# Patient Record
Sex: Female | Born: 1950 | Race: White | Hispanic: No | Marital: Married | Smoking: Former smoker
Health system: Southern US, Community
[De-identification: ages and names within clinical notes are randomized; demographics above are authoritative.]

## PROBLEM LIST (undated history)

## (undated) DIAGNOSIS — I509 Heart failure, unspecified: Secondary | ICD-10-CM

## (undated) DIAGNOSIS — F32A Depression, unspecified: Secondary | ICD-10-CM

## (undated) DIAGNOSIS — Z9989 Dependence on other enabling machines and devices: Secondary | ICD-10-CM

## (undated) DIAGNOSIS — J42 Unspecified chronic bronchitis: Secondary | ICD-10-CM

## (undated) DIAGNOSIS — M545 Low back pain, unspecified: Secondary | ICD-10-CM

## (undated) DIAGNOSIS — F329 Major depressive disorder, single episode, unspecified: Secondary | ICD-10-CM

## (undated) DIAGNOSIS — I10 Essential (primary) hypertension: Secondary | ICD-10-CM

## (undated) DIAGNOSIS — M797 Fibromyalgia: Secondary | ICD-10-CM

## (undated) DIAGNOSIS — I447 Left bundle-branch block, unspecified: Secondary | ICD-10-CM

## (undated) DIAGNOSIS — R011 Cardiac murmur, unspecified: Secondary | ICD-10-CM

## (undated) DIAGNOSIS — R519 Headache, unspecified: Secondary | ICD-10-CM

## (undated) DIAGNOSIS — R51 Headache: Secondary | ICD-10-CM

## (undated) DIAGNOSIS — M549 Dorsalgia, unspecified: Secondary | ICD-10-CM

## (undated) DIAGNOSIS — I255 Ischemic cardiomyopathy: Secondary | ICD-10-CM

## (undated) DIAGNOSIS — G8929 Other chronic pain: Secondary | ICD-10-CM

## (undated) DIAGNOSIS — R55 Syncope and collapse: Secondary | ICD-10-CM

## (undated) DIAGNOSIS — J189 Pneumonia, unspecified organism: Secondary | ICD-10-CM

## (undated) DIAGNOSIS — J45909 Unspecified asthma, uncomplicated: Secondary | ICD-10-CM

## (undated) DIAGNOSIS — M1990 Unspecified osteoarthritis, unspecified site: Secondary | ICD-10-CM

## (undated) DIAGNOSIS — J449 Chronic obstructive pulmonary disease, unspecified: Secondary | ICD-10-CM

## (undated) DIAGNOSIS — I219 Acute myocardial infarction, unspecified: Secondary | ICD-10-CM

## (undated) DIAGNOSIS — E785 Hyperlipidemia, unspecified: Secondary | ICD-10-CM

## (undated) DIAGNOSIS — F419 Anxiety disorder, unspecified: Secondary | ICD-10-CM

## (undated) DIAGNOSIS — I2510 Atherosclerotic heart disease of native coronary artery without angina pectoris: Secondary | ICD-10-CM

## (undated) DIAGNOSIS — K219 Gastro-esophageal reflux disease without esophagitis: Secondary | ICD-10-CM

## (undated) DIAGNOSIS — Z95810 Presence of automatic (implantable) cardiac defibrillator: Secondary | ICD-10-CM

## (undated) DIAGNOSIS — G4733 Obstructive sleep apnea (adult) (pediatric): Secondary | ICD-10-CM

## (undated) HISTORY — PX: HYSTEROSCOPY W/ ENDOMETRIAL ABLATION: SUR665

## (undated) HISTORY — DX: Atherosclerotic heart disease of native coronary artery without angina pectoris: I25.10

## (undated) HISTORY — PX: CORONARY ANGIOPLASTY WITH STENT PLACEMENT: SHX49

## (undated) HISTORY — DX: Syncope and collapse: R55

## (undated) HISTORY — DX: Chronic obstructive pulmonary disease, unspecified: J44.9

## (undated) HISTORY — DX: Left bundle-branch block, unspecified: I44.7

## (undated) HISTORY — DX: Essential (primary) hypertension: I10

## (undated) HISTORY — PX: FRACTURE SURGERY: SHX138

---

## 1987-02-18 HISTORY — PX: TUBAL LIGATION: SHX77

## 2009-10-21 HISTORY — PX: ORIF ANKLE FRACTURE: SHX5408

## 2012-02-18 HISTORY — PX: CORONARY ANGIOPLASTY WITH STENT PLACEMENT: SHX49

## 2012-12-13 ENCOUNTER — Other Ambulatory Visit: Payer: Self-pay | Admitting: Cardiology

## 2012-12-13 ENCOUNTER — Encounter: Payer: Self-pay | Admitting: *Deleted

## 2012-12-13 ENCOUNTER — Ambulatory Visit (INDEPENDENT_AMBULATORY_CARE_PROVIDER_SITE_OTHER): Payer: BC Managed Care – PPO | Admitting: Cardiology

## 2012-12-13 ENCOUNTER — Encounter: Payer: Self-pay | Admitting: Cardiology

## 2012-12-13 VITALS — BP 132/79 | HR 61 | Ht 60.0 in | Wt 181.0 lb

## 2012-12-13 DIAGNOSIS — R079 Chest pain, unspecified: Secondary | ICD-10-CM

## 2012-12-13 DIAGNOSIS — I25810 Atherosclerosis of coronary artery bypass graft(s) without angina pectoris: Secondary | ICD-10-CM

## 2012-12-13 DIAGNOSIS — I2510 Atherosclerotic heart disease of native coronary artery without angina pectoris: Secondary | ICD-10-CM

## 2012-12-13 DIAGNOSIS — E785 Hyperlipidemia, unspecified: Secondary | ICD-10-CM

## 2012-12-13 DIAGNOSIS — R002 Palpitations: Secondary | ICD-10-CM

## 2012-12-13 DIAGNOSIS — I10 Essential (primary) hypertension: Secondary | ICD-10-CM | POA: Insufficient documentation

## 2012-12-13 MED ORDER — NITROGLYCERIN 0.4 MG SL SUBL: 0.4000 mg | SUBLINGUAL_TABLET | SUBLINGUAL | Status: DC | PRN

## 2012-12-13 NOTE — Patient Instructions (Signed)
Your physician recommends that you schedule a follow-up appointment in: 3-4 weeks with Dr. Wyline Mood. This appointment will be scheduled today before you leave.  Your physician recommends that you continue on your current medications as directed. Please refer to the Current Medication list given to you today.  Your physician has recommended that you wear an 7 day event monitor. Event monitors are medical devices that record the heart's electrical activity. Doctors most often [place] these monitors to diagnose arrhythmias. Arrhythmias are problems with the speed or rhythm of the heartbeat. The monitor is a small, portable device. You can wear one while you do your normal daily activities. This is usually used to diagnose what is causing palpitations/syncope (passing out).

## 2012-12-13 NOTE — Progress Notes (Addendum)
Clinical Summary Ms. Dayley is a 62 y.o.female seen today as a new patient, she has been previously been followed in [place], [place] for the following problems.   1.CAD - multiple stents placed as described below, last seems was placed in Jan 2014 per her report - Echo 03/2010 with LVEF 40%, anteroapical hypokinesis. History of chronic left bundle Mychal Decarlo block. LV gram 04/2010 LVEF 55%  - reports daily chest pain. Described as sharp pain in mid chest, 4/10. +palps. No SOB, palpitations. Pain lasts for 30 seconds, better with nitroglycerin. Reports pain has been going on for last 2 years, reports last few stents haven't made much difference in symptoms. Occurs at rest or with exertion. Notes some mild increase in frequency in pain over the last few months, occurs 3-4 times a week.   - compliant with medications: on plavix since 2007, takes ASA in form of bc powder daily, lisinopril, lovastatin, metoprol.   2. HTN - dizziness with standing up, improved with recent decrease of lisinopril/HCTZ to 5/6.25 mg - compliant with medications  3. Hyperlipidemia - reports prior side effects on other statins, has tolerated lovastatin without troubles. She is not sure which other statins she has tried before.   4. Palpitations - feels several times a day. Lasts 2-3 minutes, with some associated chest pain. + SOB + lightheadedness - symptoms for several years, initially improved on metoprolol but now noticing getting worst. - drinks multiple cups of coffee, occasional sodas  Past Medical History  Diagnosis Date  . Reflux   . Hypertension   . Other and unspecified hyperlipidemia   . CAD (coronary artery disease)   . LBBB (left bundle Ahmia Colford block)   . History of depression   . COPD (chronic obstructive pulmonary disease)      Allergies  Allergen Reactions  . Codeine   . Fosamax [Alendronate Sodium] Nausea Only and Other (See Comments)    Stomach pain  . Keflex [Cephalexin] Nausea Only   . Lipitor [Atorvastatin] Other (See Comments)    Leg pain  . Prozac [Fluoxetine Hcl] Other (See Comments)    Mean/violent thoughts     Current Outpatient Prescriptions  Medication Sig Dispense Refill  . albuterol (PROAIR HFA) 108 (90 BASE) MCG/ACT inhaler Inhale 2 puffs into the lungs every 6 (six) hours as needed for wheezing.      Marland Kitchen azelastine (OPTIVAR) 0.05 % ophthalmic solution Apply 1 drop to eye 2 (two) times daily.      . clopidogrel (PLAVIX) 75 MG tablet Take 1 tablet by mouth daily.      Marland Kitchen DEXILANT 60 MG capsule Take 1 capsule by mouth daily.      Marland Kitchen escitalopram (LEXAPRO) 20 MG tablet Take 1 tablet by mouth daily.      Marland Kitchen HYDROcodone-acetaminophen (NORCO/VICODIN) 5-325 MG per tablet Take 1 tablet by mouth every 4 (four) hours as needed.      Marland Kitchen ibuprofen (ADVIL,MOTRIN) 800 MG tablet Take 1 tablet by mouth 3 (three) times daily as needed.      . lactulose (CHRONULAC) 10 GM/15ML solution Take 15 mLs by mouth 3 (three) times daily as needed.      . lidocaine (LIDODERM) 5 % Place 1 patch onto the skin daily. Remove after 12 hours      . lisinopril-hydrochlorothiazide (PRINZIDE,ZESTORETIC) 10-12.5 MG per tablet Take 1 tablet by mouth daily.      Marland Kitchen lovastatin (MEVACOR) 40 MG tablet Take 1 tablet by mouth daily.      Marland Kitchen  metoprolol (LOPRESSOR) 50 MG tablet Take 1 tablet by mouth 2 (two) times daily.      . montelukast (SINGULAIR) 10 MG tablet Take 1 tablet by mouth daily.      Marland Kitchen NITROSTAT 0.4 MG SL tablet Place 1 tablet under the tongue every 5 (five) minutes x 3 doses as needed. If no relief after 3rd dose, proceed to ED      . nystatin cream (MYCOSTATIN) Apply 1 application topically 2 (two) times daily as needed for dry skin.      Marland Kitchen rOPINIRole (REQUIP) 0.5 MG tablet Take 1-2 tablets by mouth at bedtime.      Marland Kitchen SAVELLA 100 MG TABS tablet Take 1 tablet by mouth 2 (two) times daily.      Marland Kitchen SPIRIVA HANDIHALER 18 MCG inhalation capsule Place 1 capsule into inhaler and inhale daily.      .  traMADol (ULTRAM) 50 MG tablet Take 2 tablets by mouth 2 (two) times daily as needed.       No current facility-administered medications for this visit.     Past Surgical History  Procedure Laterality Date  . Angioplasty      stent 2011, 2010, 2009  . Orif ankle fracture  [id number]  . Tubal ligation  1989  . Coronary stent placement  02/2012     Allergies  Allergen Reactions  . Codeine   . Fosamax [Alendronate Sodium] Nausea Only and Other (See Comments)    Stomach pain  . Keflex [Cephalexin] Nausea Only  . Lipitor [Atorvastatin] Other (See Comments)    Leg pain  . Prozac [Fluoxetine Hcl] Other (See Comments)    Mean/violent thoughts      Family History  Problem Relation Age of Onset  . Breast cancer Mother   . Heart failure Brother   . Breast cancer Sister   . Breast cancer Sister   . Uterine cancer Sister      Social History Ms. Shropshire reports that she has been smoking Cigarettes.  She has been smoking about 0.50 packs per day. She does not have any smokeless tobacco history on file. Ms. Rollins has no alcohol history on file.   Review of Systems CONSTITUTIONAL: No weight loss, fever, chills, weakness or fatigue.  HEENT: Eyes: No visual loss, blurred vision, double vision or yellow sclerae.No hearing loss, sneezing, congestion, runny nose or sore throat.  SKIN: No rash or itching.  CARDIOVASCULAR: per HPI RESPIRATORY: per HPI  GASTROINTESTINAL: No anorexia, nausea, vomiting or diarrhea. No abdominal pain or blood.  GENITOURINARY: No burning on urination, no polyuria NEUROLOGICAL: No headache, dizziness, syncope, paralysis, ataxia, numbness or tingling in the extremities. No change in bowel or bladder control.  MUSCULOSKELETAL: No muscle, back pain, joint pain or stiffness.  LYMPHATICS: No enlarged nodes. No history of splenectomy.  PSYCHIATRIC: No history of depression or anxiety.  ENDOCRINOLOGIC: No reports of sweating, cold or heat intolerance. No polyuria  or polydipsia.  Marland Kitchen   Physical Examination p 61 bp 132/79 Wt 181 lbs BMI 35 Gen: resting comfortably, no acute distress HEENT: no scleral icterus, pupils equal round and reactive, no palptable cervical adenopathy,  CV: RRR, no m/r/g,no JVD, no carotid bruits Resp: Clear to auscultation bilaterally GI: abdomen is soft, non-tender, non-distended, normal bowel sounds, no hepatosplenomegaly MSK: extremities are warm, no edema.  Skin: warm, no rash Neuro:  no focal deficits Psych: appropriate affect   Diagnostic Studies 04/2010 Cath [place], [place]: LVEF 55%, mild anterior hypokinesis. LM normal, LAD with stents in prox  and mid portion widely patent, LAD ostial 30%. Diags with luminal irregs. LCX with luminial irregs. RCA with distal patent stents  Stent cards May 2008 DES to PDA, DES to LAD Jan 2009 DES to prox LAD Mar 05 2009 DES to LAD x2 05/2011 DES to RCA Jan 2014 stent done [place], [place]   03/2010 Echo: LVEF 40%, hypokinesis of the anteroseptum.   12/13/12 Clinic EKG: sinus, LAD, LBBB (old),  Assessment and Plan  1. CAD - multiple stenting procedures in the past, most recent per her report was Jan 2014. Has had consistent chest pain over the last 2 years that has not improved with recent stents. - obtain Jan 2014 cath records - continue current medical therapy, her antianginal therapy is somewhat limited by her orthostatic symptoms - pending cath records, decide of possible further ischemic workup. It seems interventions have not improved her pain. - consider ranexa as antianginal at next visit  2. HTN - previously has significant orthostatic symptoms that have improved since decreasing her lisinopril/HCTZ - continue current meds, can consider changing HCTZ to alternative with additional anti-anginal effects (ex. Norvasc) at follow up visits  3. Hyperlipidemia - on fairly mild statin, reports previous muscle aches to prior statin drugs, she is unsure which one - continue  current statin, will need to check a lipid panel in the near future  4. Palpitations - will obtain a 7 day event monitor - counseled on decreasing her caffeine intake, which is quite substantial - continue metoprolol at current dose   F/u 3-4 weeks   Antoine Poche, M.D., F.A.C.C.

## 2012-12-15 DIAGNOSIS — R002 Palpitations: Secondary | ICD-10-CM

## 2012-12-18 DIAGNOSIS — R55 Syncope and collapse: Secondary | ICD-10-CM

## 2012-12-18 DIAGNOSIS — I219 Acute myocardial infarction, unspecified: Secondary | ICD-10-CM

## 2012-12-18 HISTORY — DX: Syncope and collapse: R55

## 2012-12-18 HISTORY — PX: CARDIAC CATHETERIZATION: SHX172

## 2012-12-18 HISTORY — DX: Acute myocardial infarction, unspecified: I21.9

## 2013-01-02 ENCOUNTER — Inpatient Hospital Stay (HOSPITAL_COMMUNITY): Payer: BC Managed Care – PPO

## 2013-01-02 ENCOUNTER — Encounter (HOSPITAL_COMMUNITY): Payer: Self-pay | Admitting: Physician Assistant

## 2013-01-02 ENCOUNTER — Inpatient Hospital Stay (HOSPITAL_COMMUNITY)
Admission: AD | Admit: 2013-01-02 | Discharge: 2013-01-05 | DRG: 281 | Disposition: A | Discharge status: Home / Self Care | Payer: BC Managed Care – PPO | Source: Other Acute Inpatient Hospital | Attending: Internal Medicine | Admitting: Internal Medicine

## 2013-01-02 DIAGNOSIS — E785 Hyperlipidemia, unspecified: Secondary | ICD-10-CM | POA: Diagnosis present

## 2013-01-02 DIAGNOSIS — F17200 Nicotine dependence, unspecified, uncomplicated: Secondary | ICD-10-CM | POA: Diagnosis present

## 2013-01-02 DIAGNOSIS — I509 Heart failure, unspecified: Secondary | ICD-10-CM | POA: Diagnosis present

## 2013-01-02 DIAGNOSIS — I5022 Chronic systolic (congestive) heart failure: Secondary | ICD-10-CM | POA: Diagnosis present

## 2013-01-02 DIAGNOSIS — F3289 Other specified depressive episodes: Secondary | ICD-10-CM | POA: Diagnosis present

## 2013-01-02 DIAGNOSIS — I10 Essential (primary) hypertension: Secondary | ICD-10-CM | POA: Diagnosis present

## 2013-01-02 DIAGNOSIS — Z7982 Long term (current) use of aspirin: Secondary | ICD-10-CM

## 2013-01-02 DIAGNOSIS — R55 Syncope and collapse: Secondary | ICD-10-CM | POA: Diagnosis present

## 2013-01-02 DIAGNOSIS — I447 Left bundle-branch block, unspecified: Secondary | ICD-10-CM | POA: Diagnosis present

## 2013-01-02 DIAGNOSIS — IMO0001 Reserved for inherently not codable concepts without codable children: Secondary | ICD-10-CM | POA: Diagnosis present

## 2013-01-02 DIAGNOSIS — R002 Palpitations: Secondary | ICD-10-CM

## 2013-01-02 DIAGNOSIS — I214 Non-ST elevation (NSTEMI) myocardial infarction: Principal | ICD-10-CM

## 2013-01-02 DIAGNOSIS — Z79899 Other long term (current) drug therapy: Secondary | ICD-10-CM

## 2013-01-02 DIAGNOSIS — M545 Low back pain, unspecified: Secondary | ICD-10-CM | POA: Diagnosis present

## 2013-01-02 DIAGNOSIS — Z9861 Coronary angioplasty status: Secondary | ICD-10-CM

## 2013-01-02 DIAGNOSIS — M129 Arthropathy, unspecified: Secondary | ICD-10-CM | POA: Diagnosis present

## 2013-01-02 DIAGNOSIS — J4489 Other specified chronic obstructive pulmonary disease: Secondary | ICD-10-CM | POA: Diagnosis present

## 2013-01-02 DIAGNOSIS — I951 Orthostatic hypotension: Secondary | ICD-10-CM | POA: Diagnosis present

## 2013-01-02 DIAGNOSIS — F329 Major depressive disorder, single episode, unspecified: Secondary | ICD-10-CM | POA: Diagnosis present

## 2013-01-02 DIAGNOSIS — Z7902 Long term (current) use of antithrombotics/antiplatelets: Secondary | ICD-10-CM

## 2013-01-02 DIAGNOSIS — Z9181 History of falling: Secondary | ICD-10-CM

## 2013-01-02 DIAGNOSIS — I2589 Other forms of chronic ischemic heart disease: Secondary | ICD-10-CM | POA: Diagnosis present

## 2013-01-02 DIAGNOSIS — I2510 Atherosclerotic heart disease of native coronary artery without angina pectoris: Secondary | ICD-10-CM | POA: Diagnosis present

## 2013-01-02 DIAGNOSIS — J449 Chronic obstructive pulmonary disease, unspecified: Secondary | ICD-10-CM | POA: Diagnosis present

## 2013-01-02 HISTORY — DX: Hyperlipidemia, unspecified: E78.5

## 2013-01-02 HISTORY — DX: Ischemic cardiomyopathy: I25.5

## 2013-01-02 HISTORY — DX: Fibromyalgia: M79.7

## 2013-01-02 HISTORY — DX: Cardiac murmur, unspecified: R01.1

## 2013-01-02 HISTORY — DX: Gastro-esophageal reflux disease without esophagitis: K21.9

## 2013-01-02 LAB — TROPONIN I
Troponin I: 7.17 ng/mL (ref <0.30)
Troponin I: 3.39 ng/mL (ref <0.30)

## 2013-01-02 MED ORDER — PANTOPRAZOLE SODIUM 40 MG PO TBEC
40.0000 mg | DELAYED_RELEASE_TABLET | Freq: Every day | ORAL | Status: DC
  Administered 2013-01-02 – 2013-01-05 (×4): 40 mg via ORAL
  Filled 2013-01-02 (×4): qty 1

## 2013-01-02 MED ORDER — HEPARIN BOLUS VIA INFUSION
3800.0000 [IU] | Freq: Once | INTRAVENOUS | Status: AC
  Administered 2013-01-02: 3800 [IU] via INTRAVENOUS
  Filled 2013-01-02: qty 3800

## 2013-01-02 MED ORDER — LOVASTATIN 20 MG PO TABS
40.0000 mg | ORAL_TABLET | Freq: Every day | ORAL | Status: DC
  Administered 2013-01-02 – 2013-01-04 (×3): 40 mg via ORAL
  Filled 2013-01-02 (×5): qty 2

## 2013-01-02 MED ORDER — LIDOCAINE 5 % EX PTCH
1.0000 | MEDICATED_PATCH | CUTANEOUS | Status: DC
  Administered 2013-01-02 – 2013-01-04 (×3): 1 via TRANSDERMAL
  Filled 2013-01-02 (×5): qty 1

## 2013-01-02 MED ORDER — SODIUM CHLORIDE 0.9 % IJ SOLN: 3.0000 mL | INTRAMUSCULAR | Status: DC | PRN

## 2013-01-02 MED ORDER — MILNACIPRAN HCL 100 MG PO TABS
100.0000 mg | ORAL_TABLET | Freq: Two times a day (BID) | ORAL | Status: DC
  Administered 2013-01-02 – 2013-01-05 (×6): 100 mg via ORAL
  Filled 2013-01-02 (×7): qty 1

## 2013-01-02 MED ORDER — NYSTATIN-TRIAMCINOLONE 100000-0.1 UNIT/GM-% EX CREA
1.0000 "application " | TOPICAL_CREAM | Freq: Two times a day (BID) | CUTANEOUS | Status: DC
  Administered 2013-01-02 – 2013-01-05 (×3): 1 via TOPICAL
  Filled 2013-01-02: qty 15

## 2013-01-02 MED ORDER — SODIUM CHLORIDE 0.9 % IV SOLN
INTRAVENOUS | Status: DC
  Administered 2013-01-03: 05:00:00 via INTRAVENOUS

## 2013-01-02 MED ORDER — ASPIRIN 81 MG PO CHEW
81.0000 mg | CHEWABLE_TABLET | ORAL | Status: AC
  Administered 2013-01-03: 81 mg via ORAL
  Filled 2013-01-02: qty 1

## 2013-01-02 MED ORDER — CALCIUM 1200 1200-1000 MG-UNIT PO CHEW: 1.0000 | CHEWABLE_TABLET | Freq: Every day | ORAL | Status: DC

## 2013-01-02 MED ORDER — SODIUM CHLORIDE 0.9 % IV SOLN: 250.0000 mL | INTRAVENOUS | Status: DC | PRN

## 2013-01-02 MED ORDER — ONDANSETRON HCL 4 MG/2ML IJ SOLN: 4.0000 mg | Freq: Four times a day (QID) | INTRAMUSCULAR | Status: DC | PRN

## 2013-01-02 MED ORDER — WHITE PETROLATUM GEL
Status: AC
  Administered 2013-01-02: 17:00:00
  Filled 2013-01-02: qty 5

## 2013-01-02 MED ORDER — SODIUM CHLORIDE 0.9 % IV SOLN
INTRAVENOUS | Status: DC
  Administered 2013-01-02: 1000 mL via INTRAVENOUS

## 2013-01-02 MED ORDER — LISINOPRIL-HYDROCHLOROTHIAZIDE 10-12.5 MG PO TABS: 0.5000 | ORAL_TABLET | Freq: Every day | ORAL | Status: DC

## 2013-01-02 MED ORDER — ASPIRIN 81 MG PO CHEW
81.0000 mg | CHEWABLE_TABLET | Freq: Every day | ORAL | Status: DC
  Administered 2013-01-04 – 2013-01-05 (×2): 81 mg via ORAL
  Filled 2013-01-02 (×2): qty 1

## 2013-01-02 MED ORDER — LACTULOSE 10 GM/15ML PO SOLN
30.0000 g | Freq: Two times a day (BID) | ORAL | Status: DC
  Administered 2013-01-04 – 2013-01-05 (×2): 30 g via ORAL
  Filled 2013-01-02 (×9): qty 45

## 2013-01-02 MED ORDER — CLOPIDOGREL BISULFATE 75 MG PO TABS
75.0000 mg | ORAL_TABLET | Freq: Every day | ORAL | Status: DC
  Administered 2013-01-02 – 2013-01-05 (×4): 75 mg via ORAL
  Filled 2013-01-02 (×4): qty 1

## 2013-01-02 MED ORDER — VITAMIN D 50 MCG (2000 UT) PO CAPS: 2000.0000 [IU] | ORAL_CAPSULE | Freq: Every day | ORAL | Status: DC

## 2013-01-02 MED ORDER — LISINOPRIL 10 MG PO TABS
10.0000 mg | ORAL_TABLET | Freq: Every day | ORAL | Status: DC
  Administered 2013-01-02 – 2013-01-03 (×2): 10 mg via ORAL
  Filled 2013-01-02 (×2): qty 1

## 2013-01-02 MED ORDER — CLOPIDOGREL BISULFATE 75 MG PO TABS: 75.0000 mg | ORAL_TABLET | Freq: Every day | ORAL | Status: DC

## 2013-01-02 MED ORDER — CALCIUM CARBONATE-VITAMIN D 500-200 MG-UNIT PO TABS
1.0000 | ORAL_TABLET | Freq: Every day | ORAL | Status: DC
  Administered 2013-01-03 – 2013-01-05 (×3): 1 via ORAL
  Filled 2013-01-02 (×5): qty 1

## 2013-01-02 MED ORDER — TRAMADOL HCL 50 MG PO TABS
100.0000 mg | ORAL_TABLET | Freq: Two times a day (BID) | ORAL | Status: DC
  Administered 2013-01-02 – 2013-01-05 (×5): 100 mg via ORAL
  Filled 2013-01-02 (×6): qty 2

## 2013-01-02 MED ORDER — NITROGLYCERIN 0.4 MG SL SUBL: 0.4000 mg | SUBLINGUAL_TABLET | SUBLINGUAL | Status: DC | PRN

## 2013-01-02 MED ORDER — SODIUM CHLORIDE 0.9 % IJ SOLN: 3.0000 mL | Freq: Two times a day (BID) | INTRAMUSCULAR | Status: DC

## 2013-01-02 MED ORDER — ROPINIROLE HCL 1 MG PO TABS
1.0000 mg | ORAL_TABLET | Freq: Every day | ORAL | Status: DC
  Administered 2013-01-02 – 2013-01-04 (×3): 1 mg via ORAL
  Filled 2013-01-02 (×4): qty 1

## 2013-01-02 MED ORDER — HYDROCODONE-ACETAMINOPHEN 5-325 MG PO TABS
2.0000 | ORAL_TABLET | Freq: Four times a day (QID) | ORAL | Status: DC | PRN
  Administered 2013-01-02 – 2013-01-05 (×6): 2 via ORAL
  Filled 2013-01-02 (×6): qty 2

## 2013-01-02 MED ORDER — METOPROLOL TARTRATE 50 MG PO TABS
50.0000 mg | ORAL_TABLET | Freq: Two times a day (BID) | ORAL | Status: DC
Start: 2013-01-02 — End: 2013-01-05
  Administered 2013-01-02 – 2013-01-05 (×6): 50 mg via ORAL
  Filled 2013-01-02 (×7): qty 1

## 2013-01-02 MED ORDER — ALBUTEROL SULFATE HFA 108 (90 BASE) MCG/ACT IN AERS
2.0000 | INHALATION_SPRAY | RESPIRATORY_TRACT | Status: DC | PRN
  Administered 2013-01-03 – 2013-01-04 (×2): 2 via RESPIRATORY_TRACT
  Filled 2013-01-02 (×2): qty 6.7

## 2013-01-02 MED ORDER — ACETAMINOPHEN 325 MG PO TABS
650.0000 mg | ORAL_TABLET | ORAL | Status: DC | PRN
  Administered 2013-01-04: 650 mg via ORAL
  Filled 2013-01-02: qty 2

## 2013-01-02 MED ORDER — SODIUM CHLORIDE 0.9 % IJ SOLN
3.0000 mL | Freq: Two times a day (BID) | INTRAMUSCULAR | Status: DC
  Administered 2013-01-03 (×2): 3 mL via INTRAVENOUS

## 2013-01-02 MED ORDER — MONTELUKAST SODIUM 10 MG PO TABS
10.0000 mg | ORAL_TABLET | Freq: Every morning | ORAL | Status: DC
  Administered 2013-01-03 – 2013-01-05 (×3): 10 mg via ORAL
  Filled 2013-01-02 (×4): qty 1

## 2013-01-02 MED ORDER — HEPARIN (PORCINE) IN NACL 100-0.45 UNIT/ML-% IJ SOLN
800.0000 [IU]/h | INTRAMUSCULAR | Status: DC
  Administered 2013-01-02: 800 [IU]/h via INTRAVENOUS
  Filled 2013-01-02: qty 250

## 2013-01-02 MED ORDER — LORATADINE 10 MG PO TABS
10.0000 mg | ORAL_TABLET | Freq: Every day | ORAL | Status: DC
Start: 2013-01-02 — End: 2013-01-05
  Administered 2013-01-02 – 2013-01-05 (×4): 10 mg via ORAL
  Filled 2013-01-02 (×4): qty 1

## 2013-01-02 MED ORDER — ESCITALOPRAM OXALATE 20 MG PO TABS
20.0000 mg | ORAL_TABLET | Freq: Every day | ORAL | Status: DC
  Administered 2013-01-02 – 2013-01-05 (×4): 20 mg via ORAL
  Filled 2013-01-02 (×4): qty 1

## 2013-01-02 MED ORDER — LOVASTATIN 20 MG PO TABS: 40.0000 mg | ORAL_TABLET | Freq: Every day | ORAL | Status: DC

## 2013-01-02 MED ORDER — HYDROCHLOROTHIAZIDE 12.5 MG PO CAPS
12.5000 mg | ORAL_CAPSULE | Freq: Every day | ORAL | Status: DC
  Administered 2013-01-02: 12.5 mg via ORAL
  Filled 2013-01-02 (×2): qty 1

## 2013-01-02 MED ORDER — TIOTROPIUM BROMIDE MONOHYDRATE 18 MCG IN CAPS
1.0000 | ORAL_CAPSULE | Freq: Every day | RESPIRATORY_TRACT | Status: DC
  Administered 2013-01-02 – 2013-01-04 (×3): 18 ug via RESPIRATORY_TRACT
  Filled 2013-01-02 (×2): qty 5

## 2013-01-02 MED ORDER — SIMVASTATIN 20 MG PO TABS: 20.0000 mg | ORAL_TABLET | Freq: Every day | ORAL | Status: DC

## 2013-01-02 MED ORDER — VITAMIN D3 25 MCG (1000 UNIT) PO TABS
2000.0000 [IU] | ORAL_TABLET | Freq: Every day | ORAL | Status: DC
  Administered 2013-01-02 – 2013-01-05 (×4): 2000 [IU] via ORAL
  Filled 2013-01-02 (×4): qty 2

## 2013-01-02 NOTE — Progress Notes (Signed)
ANTICOAGULATION CONSULT NOTE - Initial Consult  Pharmacy Consult for heparin Indication: chest pain/ACS  Allergies  Allergen Reactions  . Tape Rash and Other (See Comments)    Severe Reaction (latex tape): Burned hand, Redness-took days to clear up (03/02/12)  . Codeine Nausea And Vomiting  . Prozac [Fluoxetine Hcl] Other (See Comments)    Mean/violent thoughts  . Fosamax [Alendronate Sodium] Nausea Only and Other (See Comments)    Stomach pain  . Keflex [Cephalexin] Nausea Only  . Lipitor [Atorvastatin] Other (See Comments)    Leg pain    Patient Measurements: Height: 5' (152.4 cm) Weight: 177 lb 14.6 oz (80.7 kg) IBW/kg (Calculated) : 45.5 Heparin Dosing Weight: 64kg  Vital Signs: Temp: 97.8 F (36.6 C) (11/16 1546) Temp src: Oral (11/16 1546) BP: 121/63 mmHg (11/16 1600) Pulse Rate: 75 (11/16 1600)  Labs: No results found for this basename: HGB, HCT, PLT, APTT, LABPROT, INR, HEPARINUNFRC, CREATININE, CKTOTAL, CKMB, TROPONINI,  in the last 72 hours  CrCl is unknown because no creatinine reading has been taken.   Medical History: Past Medical History  Diagnosis Date  . GERD (gastroesophageal reflux disease)   . Hypertension   . HLD (hyperlipidemia)   . CAD (coronary artery disease)     a. 5/08: s/p DES to PDA and DES to LAD; b. 1/09: s/p DES to pLAD; c. 1/11: s/p DES to LAD x 2, d. LHC (3/12 in [place] - EF 55%, mild ant HK, nl LM, LAD stents ok, oLAD 30, RCA stents ok;  e.  4/13:  s/p DES to RCA, f. 1/14: dLM 20-30, mOM3 20-30, mLAD 80 ISR => s/p 2.5x16 mm Promus Element DES; EF 45-50%   . LBBB (left bundle branch block)   . History of depression   . COPD (chronic obstructive pulmonary disease)   . Ischemic cardiomyopathy     a. Echo (2/12):  EF 40%, anteroseptal HK  . Fibromyalgia   . Heart murmur     Medications:  Infusions:  . [START ON 01/03/2013] sodium chloride    . heparin      Assessment: 62 yof transferred from [facility] for NSTEMI.  Significant cardiac history. Baseline CBC and INR are WNL per labs at OSH. She is not on any anticoagulation PTA. She was started on integrilin PTA but this has been stopped upon arrival to [facility]. To start heparin gtt to for anticoagulation.   Goal of Therapy:  Heparin level 0.3-0.7 units/ml Monitor platelets by anticoagulation protocol: Yes   Plan:  1. Heparin bolus 3800 units IV x 1 2. Heparin gtt 800 units/hr 3. Check a 6 hour heparin level 4. Daily heparin level and CBC  Candace Begue, Drake Leach 01/02/2013,5:42 PM

## 2013-01-02 NOTE — Progress Notes (Signed)
CRITICAL VALUE ALERT  Critical value received:  Troponin, 7.17  Date of notification:  [id number]  Time of notification:  1929  Critical value read back:yes  Nurse who received alert:  Berniece Pap, RN  MD notified (1st page):  Dr. Donnie Aho  Time of first page:  1934  MD notified (2nd page):  Time of second page:  Responding MD:  Dr. Donnie Aho  Time MD responded:  [phone]

## 2013-01-02 NOTE — Progress Notes (Signed)
Pt admitted by EMS, transfer from [place], [place]. Pt on 2L, VSS, c/o chest pain 4/10 at this time and that this pain is "manageable" and hasn't changed since she left from [place] or gotten worse. MD (Bensimhon) notified of pt's arrival. Pt on integrilin gtt from OSH, stopped when brought here r/t incompatibility of equipment.   Pt's husband called and notified of arrival. Centralized tele and E-link updated.   Delynn Flavin, RN

## 2013-01-02 NOTE — H&P (Signed)
History and Physical   Patient ID: Becky [name], MRN: [id number], DOB: [birth year]   Date of Encounter: 01/02/2013, 4:41 PM  Primary Care Provider: Bedelia Person, MD Cardiologist: Dr. Dina Rich   Chief Complaint:  NSTEMI; Syncope  History of Present Illness: Becky [name] is a 62 y.o. female with a hx of CAD, s/p multiple PCI procedures in the past (previously followed in [place], [place]), ICM EF 40%, LBBB, chronic daily chest pain for ~ 2 years, HTN, HL, depression, COPD.  She recently established with Dr. Dina Rich in our [place] office.  Patient noted no real improvement in chest pain with recent PCI procedures.  Antianginal Rx was limited by orthostatic intolerance.  Ranexa was entertained as a possible Rx in the future.  She was set up for a 7 day event monitor due to hx of palpitations. She was asked to decrease caffeine intake.    She is transferred from [facility] for a NSTEMI.  The notes indicate she was found down by her husband.  There were reports of tongue biting and loss of bladder control.  It was not certain if she had a syncopal event or a seizure.   Data from [place]: Head CT:  Unremarkable. Neck CT:  No fracture; + multilevel DDD K+ 3, creatinine 0.74, ALT 31, INR 0.9, WBC 10, Hgb 14.5, PLT 233K, UA: many epis, mod bact, + blood, + protein Troponin 0.12 => 2.09  Patient has no memory of events after she went to bed last night.  She has recently continued to have almost daily chest pain. This seems to come on with activities. It is a left-sided sharp pain with associated dyspnea. She denies any radiating symptoms. She denies any history of syncope. She does have palpitations associated with her chest pain. She sleeps on 3 pillows chronically. There has been no change. She denies any significant pedal edema. She denies PND. Her first memory of today is waking up in the ambulance. She did feel somewhat confused.   Past Medical History    Diagnosis Date  . GERD (gastroesophageal reflux disease)   . Hypertension   . HLD (hyperlipidemia)   . CAD (coronary artery disease)     a. 5/08: s/p DES to PDA and DES to LAD; b. 1/09: s/p DES to pLAD; c. 1/11: s/p DES to LAD x 2, d. LHC (3/12 in [place] - EF 55%, mild ant HK, nl LM, LAD stents ok, oLAD 30, RCA stents ok;  e.  4/13:  s/p DES to RCA, f. 1/14: s/p stent at [place], [place] (? vessel)  . LBBB (left bundle branch block)   . History of depression   . COPD (chronic obstructive pulmonary disease)   . Ischemic cardiomyopathy     a. Echo (2/12):  EF 40%, anteroseptal HK  . Fibromyalgia   . Heart murmur      Past Surgical History  Procedure Laterality Date  . Angioplasty      stent 2011, 2010, 2009  . Orif ankle fracture  [id number]  . Tubal ligation  1989  . Coronary stent placement  02/2012     Prior to Admission medications   Medication Sig Start Date End Date Taking? Authorizing Provider  albuterol (PROAIR HFA) 108 (90 BASE) MCG/ACT inhaler Inhale 2 puffs into the lungs every 4 (four) hours as needed for shortness of breath.    Yes Historical Provider, MD  aspirin 81 MG chewable tablet Chew 81 mg by mouth daily with breakfast.   Yes  Historical Provider, MD  Calcium Carbonate-Vit D-Min (CALCIUM 1200) 1200-1000 MG-UNIT CHEW Chew 1 each by mouth daily.   Yes Historical Provider, MD  cetirizine (ZYRTEC) 10 MG tablet Take 10 mg by mouth daily.   Yes Historical Provider, MD  Cholecalciferol (VITAMIN D) 2000 UNITS CAPS Take 2,000 Units by mouth daily.    Yes Historical Provider, MD  clopidogrel (PLAVIX) 75 MG tablet Take 75 mg by mouth daily.  11/29/12  Yes Historical Provider, MD  DEXILANT 60 MG capsule Take 60 mg by mouth daily.  12/09/12  Yes Historical Provider, MD  escitalopram (LEXAPRO) 20 MG tablet Take 20 mg by mouth daily.  11/29/12  Yes Historical Provider, MD  HYDROcodone-acetaminophen (VICODIN) 5-500 MG per tablet Take 1 tablet by mouth every 4 (four) hours as  needed for pain.   Yes Historical Provider, MD  ibuprofen (ADVIL,MOTRIN) 800 MG tablet Take 800 mg by mouth every 8 (eight) hours as needed for moderate pain.   Yes Historical Provider, MD  lactulose (CHRONULAC) 10 GM/15ML solution Take 30 g by mouth 2 (two) times daily.  12/08/12  Yes Historical Provider, MD  lidocaine (LIDODERM) 5 % Place 1 patch onto the skin daily. Remove after 12 hours 11/29/12  Yes Historical Provider, MD  lisinopril-hydrochlorothiazide (PRINZIDE,ZESTORETIC) 10-12.5 MG per tablet Take 0.5 tablets by mouth daily.  11/29/12  Yes Historical Provider, MD  lovastatin (MEVACOR) 40 MG tablet Take 40 mg by mouth daily after breakfast.  12/10/12  Yes Historical Provider, MD  metoprolol (LOPRESSOR) 50 MG tablet Take 50 mg by mouth 2 (two) times daily.  11/29/12  Yes Historical Provider, MD  montelukast (SINGULAIR) 10 MG tablet Take 10 mg by mouth every morning.  11/29/12  Yes Historical Provider, MD  nitroGLYCERIN (NITROSTAT) 0.4 MG SL tablet Place 1 tablet (0.4 mg total) under the tongue every 5 (five) minutes x 3 doses as needed. If no relief after 3rd dose, proceed to ED 12/13/12  Yes Antoine Poche, MD  nystatin-triamcinolone [facility] II) cream Apply 1 application topically daily as needed (for rash/inflammation).    Yes Historical Provider, MD  oxymetazoline (AFRIN) 0.05 % nasal spray Place 2 sprays into the nose at bedtime.   Yes Historical Provider, MD  rOPINIRole (REQUIP) 0.5 MG tablet Take 1 mg by mouth at bedtime. Takes 2 tablets 11/29/12  Yes Historical Provider, MD  SAVELLA 100 MG TABS tablet Take 100 mg by mouth 2 (two) times daily.  11/29/12  Yes Historical Provider, MD  SPIRIVA HANDIHALER 18 MCG inhalation capsule Place 1 capsule into inhaler and inhale daily. 11/29/12  Yes Historical Provider, MD  traMADol (ULTRAM) 50 MG tablet Take 2 tablets by mouth 2 (two) times daily.  12/09/12  Yes Historical Provider, MD  zoledronic acid (RECLAST) 5 MG/100ML SOLN injection Inject  5 mg into the vein once.    Yes Historical Provider, MD       Allergies: Allergies  Allergen Reactions  . Tape Rash and Other (See Comments)    Severe Reaction (latex tape): Burned hand, Redness-took days to clear up (03/02/12)  . Codeine Nausea And Vomiting  . Prozac [Fluoxetine Hcl] Other (See Comments)    Mean/violent thoughts  . Fosamax [Alendronate Sodium] Nausea Only and Other (See Comments)    Stomach pain  . Keflex [Cephalexin] Nausea Only  . Lipitor [Atorvastatin] Other (See Comments)    Leg pain     Social History:  The patient  reports that she has been smoking Cigarettes.  She has been smoking  about 0.50 packs per day. She does not have any smokeless tobacco history on file. She reports that she does not drink alcohol.   Family History:  The patient's family history includes Breast cancer in her mother, sister, and sister; Heart failure in her brother; Uterine cancer in her sister.   ROS:  Please see the history of present illness.  She has chronic constipation. She denies fevers. She does have a chronic cough. She denies productive sputum. She denies any weight changes.   All other systems reviewed and negative.   Vital Signs: Blood pressure 113/65, pulse 78, temperature 97.8 F (36.6 C), temperature source Oral, resp. rate 16, height 5' (1.524 m), weight 177 lb 14.6 oz (80.7 kg), SpO2 100.00%.  PHYSICAL EXAM: General:  Well nourished, well developed, in no acute distress HEENT: normal Lymph: no adenopathy Neck: no JVD Endocrine:  No thryomegaly Vascular: DP/PT 2+ bilaterally Cardiac:  normal S1, S2; RRR; no murmur Lungs:  clear to auscultation bilaterally, no wheezing, rhonchi or rales Abd: soft, nontender, no hepatomegaly Ext: no edema Musculoskeletal:  No deformities, BUE and BLE strength normal and equal Skin: warm and dry Neuro:  CNs 2-12 intact, no focal abnormalities noted Psych:  Normal affect    EKG:  NSR, HR 81, LBBB  Labs:   As noted  above  Radiology/Studies:   No results found.    ASSESSMENT AND PLAN:   1. Non-STEMI: Admit to step down. Treat with aspirin, beta blocker, IV heparin. D/c Integrillin.  Plan cardiac catheterization tomorrow. 2. Syncope: It is not clear if her episode today was syncope versus a seizure. Head CT was negative. Obtain echocardiogram. If she has no significant obstructive CAD, consider workup for seizures/neuro consult. 3. Palpitations: She recently wore a monitor. Unfortunately she did not have this on today when she had her event. Continue to monitor on telemetry. 4. Hypertension: Continue current therapy. 5. Hyperlipidemia: Continue current therapy. 6. Tobacco abuse: Cessation is strongly encouraged. 7. COPD: Continue current therapy. 8. Depression: Continue current therapy. 9. Disposition: Plan cardiac catheterization tomorrow.  Luna Glasgow, PA-C 01/02/2013 4:41 PM  Pager # [phone]  Seen on unit with Tereso Newcomer, PA-C. Patient with long and extensive history of CAD presents after an apparent unwitnessed seizure at home this am. Husband heard her fall in the kitchen. She remembers nothing until waking up in the ambulance. She has had frequent chest pain.  When severe she will take one SL NTG with usual prompt relief. Her EKG is not helpful because of the chronic LBBB. The EKG was reviewed by me and is unchanged since October 2014. Her second troponin is elevated 2.09.  Physical exam is unremarkable. Agree with assessment and plan.  If no new obstructive disease noted on cath, consider addition of Ranexa. Consider neurology evaluation re question of syncope vs seizure.  It will be of interest to see if her recently completed 7 day event monitor showed any arrhythmias of note.

## 2013-01-03 ENCOUNTER — Encounter (HOSPITAL_COMMUNITY)
Admission: AD | Disposition: A | Discharge status: Home / Self Care | Payer: Self-pay | Source: Other Acute Inpatient Hospital | Attending: Internal Medicine

## 2013-01-03 ENCOUNTER — Inpatient Hospital Stay (HOSPITAL_COMMUNITY): Payer: BC Managed Care – PPO

## 2013-01-03 DIAGNOSIS — R55 Syncope and collapse: Secondary | ICD-10-CM

## 2013-01-03 DIAGNOSIS — I2510 Atherosclerotic heart disease of native coronary artery without angina pectoris: Secondary | ICD-10-CM

## 2013-01-03 DIAGNOSIS — I519 Heart disease, unspecified: Secondary | ICD-10-CM

## 2013-01-03 DIAGNOSIS — I2589 Other forms of chronic ischemic heart disease: Secondary | ICD-10-CM

## 2013-01-03 HISTORY — PX: LEFT HEART CATHETERIZATION WITH CORONARY ANGIOGRAM: SHX5451

## 2013-01-03 LAB — CBC
HCT: 38.9 % (ref 36.0–46.0)
MCHC: 35 g/dL (ref 30.0–36.0)
MCV: 89.6 fL (ref 78.0–100.0)
Platelets: 216 10*3/uL (ref 150–400)
Platelets: 218 10*3/uL (ref 150–400)
RBC: 4.34 MIL/uL (ref 3.87–5.11)
RDW: 12.7 % (ref 11.5–15.5)
RDW: 12.7 % (ref 11.5–15.5)
WBC: 6.6 10*3/uL (ref 4.0–10.5)

## 2013-01-03 LAB — BASIC METABOLIC PANEL
CO2: 26 mEq/L (ref 19–32)
Calcium: 9.9 mg/dL (ref 8.4–10.5)
GFR calc Af Amer: >90 mL/min (ref >90)
GFR calc non Af Amer: >90 mL/min (ref >90)
Sodium: 136 mEq/L (ref 135–145)

## 2013-01-03 LAB — PLATELET INHIBITION P2Y12: Platelet Function  P2Y12: 121 [PRU] — ABNORMAL LOW (ref 194–418)

## 2013-01-03 LAB — LIPID PANEL
HDL: 46 mg/dL (ref >39)
Triglycerides: 139 mg/dL (ref <150)

## 2013-01-03 LAB — TROPONIN I: Troponin I: 2.19 ng/mL (ref <0.30)

## 2013-01-03 LAB — HEPARIN LEVEL (UNFRACTIONATED): Heparin Unfractionated: 0.68 IU/mL (ref 0.30–0.70)

## 2013-01-03 LAB — D-DIMER, QUANTITATIVE: D-Dimer, Quant: 0.42 ug/mL-FEU (ref 0.00–0.48)

## 2013-01-03 SURGERY — LEFT HEART CATHETERIZATION WITH CORONARY ANGIOGRAM
Anesthesia: LOCAL

## 2013-01-03 MED ORDER — HEPARIN (PORCINE) IN NACL 2-0.9 UNIT/ML-% IJ SOLN
INTRAMUSCULAR | Status: AC
  Filled 2013-01-03: qty 1000

## 2013-01-03 MED ORDER — NITROGLYCERIN 0.2 MG/ML ON CALL CATH LAB
INTRAVENOUS | Status: AC
  Filled 2013-01-03: qty 1

## 2013-01-03 MED ORDER — LISINOPRIL 5 MG PO TABS
5.0000 mg | ORAL_TABLET | Freq: Every day | ORAL | Status: DC
  Administered 2013-01-04 – 2013-01-05 (×2): 5 mg via ORAL
  Filled 2013-01-03 (×2): qty 1

## 2013-01-03 MED ORDER — STUDY - INVESTIGATIONAL DRUG SIMPLE RECORD
7.5000 mg | Freq: Two times a day (BID) | Status: DC
  Administered 2013-01-03 – 2013-01-05 (×5): 7.5 mg via ORAL
  Filled 2013-01-03 (×4): qty 7.5

## 2013-01-03 MED ORDER — FENTANYL CITRATE 0.05 MG/ML IJ SOLN
INTRAMUSCULAR | Status: AC
  Filled 2013-01-03: qty 2

## 2013-01-03 MED ORDER — SODIUM CHLORIDE 0.9 % IV SOLN
INTRAVENOUS | Status: AC
  Administered 2013-01-03: 13:00:00 via INTRAVENOUS

## 2013-01-03 MED ORDER — MIDAZOLAM HCL 2 MG/2ML IJ SOLN
INTRAMUSCULAR | Status: AC
  Filled 2013-01-03: qty 2

## 2013-01-03 MED ORDER — VERAPAMIL HCL 2.5 MG/ML IV SOLN
INTRAVENOUS | Status: AC
  Filled 2013-01-03: qty 2

## 2013-01-03 MED ORDER — LIDOCAINE HCL (PF) 1 % IJ SOLN
INTRAMUSCULAR | Status: AC
  Filled 2013-01-03: qty 30

## 2013-01-03 MED ORDER — HEPARIN SODIUM (PORCINE) 1000 UNIT/ML IJ SOLN
INTRAMUSCULAR | Status: AC
  Filled 2013-01-03: qty 1

## 2013-01-03 NOTE — Consult Note (Signed)
ELECTROPHYSIOLOGY CONSULT NOTE    Patient ID: Becky [name] MRN: [id number], DOB/AGE: [birth year] 62 y.o.  Admit date: 01/02/2013 Date of Consult: 01-03-2013  Primary Physician: Bedelia Person, MD Primary Cardiologist: Dina Rich, MD  Reason for Consultation: syncope, cardiomyopathy  HPI:  Becky [name] is a 62 year old female with a past medical history significant for coronary disease (s/p multiple PCI procedures in the past - previously followed in [place], [place]), ICM (EF 40% 2013, 25-30% by cath this admission), LBBB, hypertension, hyperlipidemia, depression, and COPD.  She was recently seen in our [place] office to establish cardiology care. At that time, she was having persistent chest pain but had undergone PCI in January of this year with no relief of the same type of pain.  Medications have been limited due to orthostatic hypotension.  She was also complaining of palpitations and an event monitor was placed for evaluation.    On the day of admission, she was found down by her husband. He states that preceding the event, she was confused and was wanting to "fix her coffee pot".  She then went into the kitchen and he heard her fall.  She has no recollection of the events.  He states that she was breathing and after a few minutes her legs started shaking.  He called 911 and EMS arrived about 15 minutes later. She remembers waking up in the ambulance.  There were reports of tongue biting, loss of bladder/bowel control is less clear.  She ruled in for NSTEMI and was transferred to [facility] for further evaluation.  She was not wearing her event monitor at the time of her episode.    Telemetry this admission has demonstrated sinus rhythm with unifocal PVC's.  She has had no sustained ventricular arrhythmias.  Echo this admission demonstrated an EF of 25-30%, mild focal basal hypertrophy of the septum, akinesis of anteroseptal and apical myocardium, LA 42.   EP has been asked to evaluate for  treatment options.   ROS is negative except as outlined above .  Past Medical History  Diagnosis Date  . GERD (gastroesophageal reflux disease)   . Hypertension   . HLD (hyperlipidemia)   . CAD (coronary artery disease)     a. 5/08: s/p DES to PDA and DES to LAD; b. 1/09: s/p DES to pLAD; c. 1/11: s/p DES to LAD x 2, d. LHC (3/12 in [place] - EF 55%, mild ant HK, nl LM, LAD stents ok, oLAD 30, RCA stents ok;  e.  4/13:  s/p DES to RCA, f. 1/14: dLM 20-30, mOM3 20-30, mLAD 80 ISR => s/p 2.5x16 mm Promus Element DES; EF 45-50%   . LBBB (left bundle branch block)   . History of depression   . COPD (chronic obstructive pulmonary disease)   . Ischemic cardiomyopathy     a. Echo (2/12):  EF 40%, anteroseptal HK  . Fibromyalgia   . Heart murmur      Surgical History:  Past Surgical History  Procedure Laterality Date  . Angioplasty      stent 2011, 2010, 2009  . Orif ankle fracture  [id number]  . Tubal ligation  1989  . Coronary stent placement  02/2012     Prescriptions prior to admission  Medication Sig Dispense Refill  . albuterol (PROAIR HFA) 108 (90 BASE) MCG/ACT inhaler Inhale 2 puffs into the lungs every 4 (four) hours as needed for shortness of breath.       Marland Kitchen aspirin 81 MG chewable tablet  Chew 81 mg by mouth daily with breakfast.      . Calcium Carbonate-Vit D-Min (CALCIUM 1200) 1200-1000 MG-UNIT CHEW Chew 1 each by mouth daily.      . cetirizine (ZYRTEC) 10 MG tablet Take 10 mg by mouth daily.      . Cholecalciferol (VITAMIN D) 2000 UNITS CAPS Take 2,000 Units by mouth daily.       . clopidogrel (PLAVIX) 75 MG tablet Take 75 mg by mouth daily.       Marland Kitchen DEXILANT 60 MG capsule Take 60 mg by mouth daily.       Marland Kitchen escitalopram (LEXAPRO) 20 MG tablet Take 20 mg by mouth daily.       Marland Kitchen HYDROcodone-acetaminophen (VICODIN) 5-500 MG per tablet Take 1 tablet by mouth every 4 (four) hours as needed for pain.      Marland Kitchen ibuprofen (ADVIL,MOTRIN) 800 MG tablet Take 800 mg by mouth every 8  (eight) hours as needed for moderate pain.      Marland Kitchen lactulose (CHRONULAC) 10 GM/15ML solution Take 30 g by mouth 2 (two) times daily.       Marland Kitchen lidocaine (LIDODERM) 5 % Place 1 patch onto the skin daily. Remove after 12 hours      . lisinopril-hydrochlorothiazide (PRINZIDE,ZESTORETIC) 10-12.5 MG per tablet Take 0.5 tablets by mouth daily.       Marland Kitchen lovastatin (MEVACOR) 40 MG tablet Take 40 mg by mouth daily after breakfast.       . metoprolol (LOPRESSOR) 50 MG tablet Take 50 mg by mouth 2 (two) times daily.       . montelukast (SINGULAIR) 10 MG tablet Take 10 mg by mouth every morning.       . nitroGLYCERIN (NITROSTAT) 0.4 MG SL tablet Place 1 tablet (0.4 mg total) under the tongue every 5 (five) minutes x 3 doses as needed. If no relief after 3rd dose, proceed to ED  25 tablet  11  . nystatin-triamcinolone (MYCOLOG II) cream Apply 1 application topically daily as needed (for rash/inflammation).       Marland Kitchen oxymetazoline (AFRIN) 0.05 % nasal spray Place 2 sprays into the nose at bedtime.      Marland Kitchen rOPINIRole (REQUIP) 0.5 MG tablet Take 1 mg by mouth at bedtime. Takes 2 tablets      . SAVELLA 100 MG TABS tablet Take 100 mg by mouth 2 (two) times daily.       Marland Kitchen SPIRIVA HANDIHALER 18 MCG inhalation capsule Place 1 capsule into inhaler and inhale daily.      . traMADol (ULTRAM) 50 MG tablet Take 2 tablets by mouth 2 (two) times daily.       . zoledronic acid (RECLAST) 5 MG/100ML SOLN injection Inject 5 mg into the vein once.         Inpatient Medications:  . aspirin  81 mg Oral Q breakfast  . calcium-vitamin D  1 tablet Oral Q breakfast  . cholecalciferol  2,000 Units Oral Daily  . clopidogrel  75 mg Oral Q breakfast  . escitalopram  20 mg Oral Daily  . lactulose  30 g Oral BID  . Latitude-TIMI study- Placebo / Losmapimod  (PI- Stuckey )  7.5 mg Oral BID  . lidocaine  1 patch Transdermal Q24H  . [START ON 01/04/2013] lisinopril  5 mg Oral Daily  . loratadine  10 mg Oral Daily  . lovastatin  40 mg Oral  q1800  . metoprolol  50 mg Oral BID  . Milnacipran HCl  100  mg Oral BID  . montelukast  10 mg Oral q morning - 10a  . nystatin-triamcinolone  1 application Topical BID  . pantoprazole  40 mg Oral Daily  . rOPINIRole  1 mg Oral QHS  . sodium chloride  3 mL Intravenous Q12H  . tiotropium  1 capsule Inhalation Daily  . traMADol  100 mg Oral BID    Allergies:  Allergies  Allergen Reactions  . Tape Rash and Other (See Comments)    Severe Reaction (latex tape): Burned hand, Redness-took days to clear up (03/02/12)  . Codeine Nausea And Vomiting  . Prozac [Fluoxetine Hcl] Other (See Comments)    Mean/violent thoughts  . Fosamax [Alendronate Sodium] Nausea Only and Other (See Comments)    Stomach pain  . Keflex [Cephalexin] Nausea Only  . Lipitor [Atorvastatin] Other (See Comments)    Leg pain    History   Social History  . Marital Status: Married    Spouse Name: N/A    Number of Children: N/A  . Years of Education: N/A   Occupational History  . Not on file.   Social History Main Topics  . Smoking status: Current Every Day Smoker -- 0.50 packs/day    Types: Cigarettes  . Smokeless tobacco: Not on file  . Alcohol Use: No  . Drug Use: Not on file  . Sexual Activity: Not on file   Other Topics Concern  . Not on file   Social History Narrative  . No narrative on file     Family History  Problem Relation Age of Onset  . Breast cancer Mother   . Heart failure Brother   . Breast cancer Sister   . Breast cancer Sister   . Uterine cancer Sister      Physical Exam Well appearing middle aged woman, NAD HEENT: Unremarkable Neck:  7 cm JVD, no thyromegally Back:  No CVA tenderness Lungs:  Clear except for scattered rales HEART:  Regular rate rhythm, no murmurs, no rubs, no clicks, split S2. Abd:  soft, positive bowel sounds, no organomegally, no rebound, no guarding Ext:  2 plus pulses, no edema, no cyanosis, no clubbing Skin:  No rashes no nodules Neuro:  CN II  through XII intact, motor grossly intact    Labs:   Lab Results  Component Value Date   WBC 5.7 01/03/2013   HGB 13.6 01/03/2013   HCT 38.9 01/03/2013   MCV 89.6 01/03/2013   PLT 218 01/03/2013    Recent Labs Lab 01/03/13 0040  NA 136  K 3.5  CL 102  CO2 26  BUN 9  CREATININE 0.69  CALCIUM 9.9  GLUCOSE 126*   Lab Results  Component Value Date   TROPONINI 2.19* 01/03/2013   Lab Results  Component Value Date   CHOL 152 01/03/2013   Lab Results  Component Value Date   HDL 46 01/03/2013   Lab Results  Component Value Date   LDLCALC 78 01/03/2013   Lab Results  Component Value Date   TRIG 139 01/03/2013   Lab Results  Component Value Date   CHOLHDL 3.3 01/03/2013    Lab Results  Component Value Date   DDIMER 0.42 01/03/2013     Radiology/Studies: Portable Chest X-ray 1 View 01/02/2013   CLINICAL DATA:  Chest pain.  Coronary artery disease.  EXAM: PORTABLE CHEST - 1 VIEW  COMPARISON:  None.  FINDINGS: Heart size and vascularity are within normal limits and the lungs are clear. Coronary artery stents are noted.  No osseous abnormality.  IMPRESSION: No acute disease.   Electronically Signed   By: Geanie Cooley M.D.   On: 01/02/2013 18:42    ZOX:WRUEA rhythm, LBBB, QRS 156  TELEMETRY: sinus rhythm with PVC's  A/P 1. Syncope, not consistent with an arrhythmia but cannot definitively rule out 2. Ischemic CM, with worsening LV function with elevated troponin this admit with no culprit lesion by cath 3. LBBB 4. Class 2 CHF, EF now 25% Rec: in light of all of the above, would suggest a Life Vest, repeat 2D echo after 3 months and if EF is down, she will need ICD implant, likely BiV. She will need optimization of medical therapy. As the patient lives in [place], [place], will likely have her followup with Dr. Johney Frame in our [place] office.  Leonia Reeves.D.

## 2013-01-03 NOTE — Progress Notes (Signed)
To the cath lab. By bed, heparin gtt stopped.

## 2013-01-03 NOTE — Progress Notes (Signed)
*  PRELIMINARY RESULTS* Vascular Ultrasound Carotid Duplex (Doppler) has been completed.  Preliminary findings: Right = 1-39% ICA stenosis, high end of scale. Left = 40-59% ICA stenosis, low end of scale.   Becky [name], Becky [name] 01/03/2013, 6:22 PM

## 2013-01-03 NOTE — Care Management Note (Addendum)
    Page 1 of 1   01/05/2013     2:59:24 PM   CARE MANAGEMENT NOTE 01/05/2013  Patient:  Becky [name], Becky [name]   Account Number:  [id number]  Date Initiated:  01/03/2013  Documentation initiated by:  Junius Creamer  Subjective/Objective Assessment:   adm w mi     Action/Plan:   lives w husband, pcp dr Fanny Dance   Anticipated DC Date:  01/06/2013   Anticipated DC Plan:  HOME/SELF CARE      DC Planning Services  CM consult      Choice offered to / List presented to:     DME arranged  OTHER - SEE COMMENT      DME agency  OTHER - SEE NOTE        Status of service:  Completed, signed off Medicare Important Message given?   (If response is "NO", the following Medicare IM given date fields will be blank) Date Medicare IM given:   Date Additional Medicare IM given:    Discharge Disposition:  HOME/SELF CARE  Per UR Regulation:  Reviewed for med. necessity/level of care/duration of stay  If discussed at Long Length of Stay Meetings, dates discussed:    Comments:  01/05/13 [name],RN,BSN [phone] PT FOR DC HOME TODAY WITH [facility].  PT HAS RECEIVED TEACHING AND INSTRUCTIONS FROM [facility] AND IS READY FOR DC, PER KELLY LANIER WITH [facility].

## 2013-01-03 NOTE — CV Procedure (Addendum)
      Cardiac Catheterization Operative Report  Becky [name] [id number] 11/17/201410:34 AM Bedelia Person, MD  Procedure Performed:  1. Left Heart Catheterization 2. Selective Coronary Angiography 3. Left ventricular angiogram  Operator: Verne Carrow, MD  Arterial access site:  Right radial artery.   Indication: 62 yo female with history of CAD, HTN, HLD, tobacco abuse admitted after syncopal event at home. Her cardiac issues are followed in [place], [place]. Last cath January 2014 with DES placed mid RCA. She also had a DES mid LAD April 2013. Troponin elevated. She has daily chest pains. NO recent changes.                                        Procedure Details: The risks, benefits, complications, treatment options, and expected outcomes were discussed with the patient. The patient and/or family concurred with the proposed plan, giving informed consent. The patient was brought to the cath lab after IV hydration was begun and oral premedication was given. The patient was further sedated with Versed and Fentanyl. The right wrist was assessed with an Allens test which was positive. The right wrist was prepped and draped in a sterile fashion. 1% lidocaine was used for local anesthesia. Using the modified Seldinger access technique, a 5 French sheath was placed in the right radial artery. 3 mg Verapamil was given through the sheath. 4000 units IV heparin was given. Standard diagnostic catheters were used to perform selective coronary angiography. A pigtail catheter was used to perform a left ventricular angiogram. The sheath was removed from the right radial artery and a Terumo hemostasis band was applied at the arteriotomy site on the right wrist.    There were no immediate complications. The patient was taken to the recovery area in stable condition.   Hemodynamic Findings: Central aortic pressure: 147/79 Left ventricular pressure: 130/22/26  Angiographic Findings:  Left  main: 30% distal stenosis. It appears that the distal left main has a stent that continues into the LAD.   Left Anterior Descending Artery: Large vessel that courses to the apex. The entire proximal and mid LAD is stented. There is mild stent restenosis in the proximal segment. The mid stented segment has diffuse 30% stent restenosis. The distal vessel becomes small in caliber and has mild diffuse plaque. There is a moderate caliber first diagonal branch with mild plaque disease.   Circumflex Artery: Moderate caliber non-dominant vessel with three moderate caliber obtuse marginal branches. No obstructive disease.   Right Coronary Artery: Large dominant vessel with 30% proximal stenosis, heavily calcified mid vessel with patent stent in the mid vessel (no significant restenosis). The distal vessel has diffuse 40-50% stenosis. The posterolateral branch and PDA are moderate caliber, patent vessels with mild plaque disease.   Left Ventricular Angiogram: LVEF=25-30%. Hypokinesis of the antero-apical wall.   Impression: 1. Stable double vessel CAD with patent stents RCA and LAD 2. Elevated troponin following syncopal event. No culprit lesions seen.  3. Moderate to severe LV systolic dysfunction.   Recommendations: Continue medical management of CAD. Workup for syncope is underway.  Carotid dopplers today. Will check d-dimer as PE is a possibility. She could also have had an arrythmia with LV dysfunction.        Complications:  None. The patient tolerated the procedure well.

## 2013-01-03 NOTE — Progress Notes (Signed)
No bleeding noted at right radial site. Removed last 2 cc of air in the TR band. Re-assessed site, no bleeding noted. Vascular site= level 0.  TR band removed and Tegaderm dressing applied. VS remain stable. RT radial pulse +2.  Will continue to monitor.

## 2013-01-03 NOTE — Progress Notes (Signed)
Not coming back from the cath lab. , belongings endorsed to the cath lab staff.

## 2013-01-03 NOTE — Progress Notes (Signed)
Patient: Becky [name] / Admit Date: 01/02/2013 / Date of Encounter: 01/03/2013, 9:07 AM   Subjective  Denies CP this AM but does has intermittent SOB. Back is sore/achy towards tailbone. Has arthritis there but it feels worse after her fall. Denies recent bedrest, injury, travel, personal/family hx of blood clot. No prior h/o syncope or seizure disorder.  Objective   Telemetry: NSR rare PVC  Physical Exam: Blood pressure 96/60, pulse 76, temperature 98.4 F (36.9 C), temperature source Oral, resp. rate 16, height 5' (1.524 m), weight 180 lb (81.647 kg), SpO2 94.00%. General: Well developed, well nourished WF, in no acute distress. Head: Normocephalic, atraumatic, sclera non-icteric, no xanthomas, nares are without discharge. Neck: Negative for carotid bruits. JVP not elevated. Lungs: Clear bilaterally to auscultation without wheezes, rales, or rhonchi. Breathing is unlabored. Heart: RRR S1 S2 distant heart sounds without murmurs, rubs, or gallops.  Abdomen: Soft, non-tender, non-distended with normoactive bowel sounds. No rebound/guarding. Extremities: No clubbing or cyanosis. No edema. Distal pedal pulses are 2+ and equal bilaterally. Neuro: Alert and oriented X 3. Moves all extremities spontaneously. Psych:  Responds to questions appropriately with a normal affect. Back: no ecchymosis or abrasions, atraumatic   Intake/Output Summary (Last 24 hours) at 01/03/13 4098 Last data filed at 01/03/13 0800  Gross per 24 hour  Intake  665.5 ml  Output      0 ml  Net  665.5 ml    Inpatient Medications:  . aspirin  81 mg Oral Q breakfast  . calcium-vitamin D  1 tablet Oral Q breakfast  . cholecalciferol  2,000 Units Oral Daily  . clopidogrel  75 mg Oral Q breakfast  . escitalopram  20 mg Oral Daily  . lisinopril  10 mg Oral Daily   And  . hydrochlorothiazide  12.5 mg Oral Daily  . lactulose  30 g Oral BID  . lidocaine  1 patch Transdermal Q24H  . loratadine  10 mg Oral Daily    . lovastatin  40 mg Oral q1800  . metoprolol  50 mg Oral BID  . Milnacipran HCl  100 mg Oral BID  . montelukast  10 mg Oral q morning - 10a  . nystatin-triamcinolone  1 application Topical BID  . pantoprazole  40 mg Oral Daily  . rOPINIRole  1 mg Oral QHS  . sodium chloride  3 mL Intravenous Q12H  . sodium chloride  3 mL Intravenous Q12H  . tiotropium  1 capsule Inhalation Daily  . traMADol  100 mg Oral BID   Infusions:  . sodium chloride 75 mL/hr at 01/03/13 0506  . sodium chloride 10 mL/hr at 01/03/13 0400  . heparin 800 Units/hr (01/03/13 0400)    Labs:  Recent Labs  01/03/13 0040  NA 136  K 3.5  CL 102  CO2 26  GLUCOSE 126*  BUN 9  CREATININE 0.69  CALCIUM 9.9    Recent Labs  01/03/13 0040  WBC 6.6  HGB 13.7  HCT 39.3  MCV 90.6  PLT 216    Recent Labs  01/02/13 1810 01/02/13 2231 01/03/13 0557  TROPONINI 7.17* 3.39* 2.19*     Radiology/Studies:  Portable Chest X-ray 1 View  01/02/2013   CLINICAL DATA:  Chest pain.  Coronary artery disease.  EXAM: PORTABLE CHEST - 1 VIEW  COMPARISON:  None.  FINDINGS: Heart size and vascularity are within normal limits and the lungs are clear. Coronary artery stents are noted. No osseous abnormality.  IMPRESSION: No acute disease.   Electronically  Signed   By: Geanie Cooley M.D.   On: 01/02/2013 18:42     Assessment and Plan  1. CAD/NSTEMI: multiple prior PCIs, chronic daily CP without real improvement in CP with recent PCI (last 02/2012, PTCA/DES to mLAD). Plan cardiac cath today. Continue ASA, Plavix, metoprolol, statin, heparin. If cath unrevealing, would consider w/u for PE or neuro workup. Check P2Y12. Per Dr. Patty Sermons, also consider addition of Ranexa for CP.  2. Syncope with possible seizure: reported shaking per husband and pt thinks she bit her tongue. Denies b/b incontinence. If cath unrevealing, may need w/u for PE vs neuro eval. Await echo. Check carotid dopplers. Check orthostatics. 3. Palpitations: event  monitor recently in progress but was not wearing at time of syncopal spell. Rhythm stable on tele, rare PVCs. I have contacted our office to see if we can obtain the results of the event monitor thus far. 4. HTN: BP soft this AM. DC HCTZ component and resume ACEI only tomorrow at home dose (5mg ). 5. HLD: cont statin. 6. COPD/tobacco abuse: tobacco cessation advised. Cont home regimen. 7. ICM EF 40% by echo 2012: await f/u echo. On BB/ACEI. Appears euvolemic. 8. Low back pain: arthritis likely exacerbated by fall. Check plain films of l-spine and sacrum to r/o compression fx. Tramadol available PRN.  Signed, Ronie Spies PA-C   I have personally seen and examined this patient with Ronie Spies, PA-C.  I agree with the assessment and plan as outlined above. Plans for cardiac cath today to exclude progression of CAD. If cath negative, will check D-dimer as PE is a possibility. May need neuro evaluation. Carotid dopplers today.   Kason Benak 11:18 AM 01/03/2013

## 2013-01-03 NOTE — Progress Notes (Signed)
  Echocardiogram 2D Echocardiogram has been performed.  Georgian Co 01/03/2013, 9:52 AM

## 2013-01-03 NOTE — Progress Notes (Signed)
Event monitor strips faxed to the cath lab. Shows NSR with occ PVCs. No significant or sustained arrhythmias. (Note: pt was not wearing this at time of syncopal spell, but was reviewed in case other arrhythmias were occurring outside tis event.) See prior rounding note from today. Shareen Capwell PA-C

## 2013-01-03 NOTE — Progress Notes (Signed)
ANTICOAGULATION CONSULT NOTE - Follow Up Consult  Pharmacy Consult for Heparin  Indication: chest pain/ACS  Allergies  Allergen Reactions  . Tape Rash and Other (See Comments)    Severe Reaction (latex tape): Burned hand, Redness-took days to clear up (03/02/12)  . Codeine Nausea And Vomiting  . Prozac [Fluoxetine Hcl] Other (See Comments)    Mean/violent thoughts  . Fosamax [Alendronate Sodium] Nausea Only and Other (See Comments)    Stomach pain  . Keflex [Cephalexin] Nausea Only  . Lipitor [Atorvastatin] Other (See Comments)    Leg pain   Patient Measurements: Height: 5' (152.4 cm) Weight: 177 lb 14.6 oz (80.7 kg) IBW/kg (Calculated) : 45.5  Vital Signs: Temp: 98.5 F (36.9 C) (11/17 0000) Temp src: Oral (11/17 0000) BP: 115/59 mmHg (11/16 2300) Pulse Rate: 82 (11/16 2300)  Labs:  Recent Labs  01/02/13 1810 01/02/13 2231 01/03/13 0040  HGB  --   --  13.7  HCT  --   --  39.3  PLT  --   --  216  HEPARINUNFRC  --   --  0.68  CREATININE  --   --  0.69  TROPONINI 7.17* 3.39*  --    Estimated Creatinine Clearance: 68.6 ml/min (by C-G formula based on Cr of 0.69).  Medications:  Heparin 800 units/hr  Assessment: 62 y/o F on heparin for NSTEMI. HL 0.68  Goal of Therapy:  Heparin level 0.3-0.7 units/ml Monitor platelets by anticoagulation protocol: Yes   Plan:  -Continue heparin drip at 800 units/hr -HL at 0800 to confirm -Daily CBC/HL -Monitor for bleeding -F/U cardiology plans--likely cath this AM  Thank you for allowing me to take part in this patient's care,  Abran Duke, PharmD Clinical Pharmacist Phone: [phone] Pager: [phone] 01/03/2013 1:50 AM

## 2013-01-03 NOTE — Progress Notes (Signed)
01/02/13 2300-0700 Pt.A/Ox4 and is ambulatory with 1 person assist. She is on 2 L [place] oxygen nasal cannula and is also on a continuous heparin drip. Pt.is going for cardiac catheterization today. Signed consent in patient's chart. Both groins and right radial were clipped. 2nd IV restart done. EKG and labs done. PT/INR ordered.Pt.placed on PCI/Cardiac Cath Day Care Plan. Asked pt.if she watched cardiac cath video last night and she said yes. She had no c/o pain and no signs of distress during the shift. Pt.husband stayed at beside during the shift.

## 2013-01-03 NOTE — Interval H&P Note (Signed)
History and Physical Interval Note:  01/03/2013 10:24 AM  Becky [name]  has presented today for cardiac cath with the known CAD, recent chest pain, NSTEMI.  The various methods of treatment have been discussed with the patient and family. After consideration of risks, benefits and other options for treatment, the patient has consented to  Procedure(s): LEFT HEART CATHETERIZATION WITH CORONARY ANGIOGRAM (N/A) as a surgical intervention .  The patient's history has been reviewed, patient examined, no change in status, stable for surgery.  I have reviewed the patient's chart and labs.  Questions were answered to the patient's satisfaction.    Cath Lab Visit (complete for each Cath Lab visit)  Clinical Evaluation Leading to the Procedure:   ACS: yes  Non-ACS:    Anginal Classification: CCS IV  Anti-ischemic medical therapy: Minimal Therapy (1 class of medications)  Non-Invasive Test Results: No non-invasive testing performed  Prior CABG: No previous CABG        Becky [name]

## 2013-01-04 ENCOUNTER — Inpatient Hospital Stay (HOSPITAL_COMMUNITY): Payer: BC Managed Care – PPO

## 2013-01-04 DIAGNOSIS — I2510 Atherosclerotic heart disease of native coronary artery without angina pectoris: Secondary | ICD-10-CM

## 2013-01-04 DIAGNOSIS — I10 Essential (primary) hypertension: Secondary | ICD-10-CM

## 2013-01-04 DIAGNOSIS — R002 Palpitations: Secondary | ICD-10-CM

## 2013-01-04 LAB — CBC
HCT: 38.8 % (ref 36.0–46.0)
MCH: 31.6 pg (ref 26.0–34.0)
MCHC: 35.8 g/dL (ref 30.0–36.0)
Platelets: 219 10*3/uL (ref 150–400)
RBC: 4.4 MIL/uL (ref 3.87–5.11)
WBC: 6.6 10*3/uL (ref 4.0–10.5)

## 2013-01-04 LAB — BASIC METABOLIC PANEL
Calcium: 10 mg/dL (ref 8.4–10.5)
Chloride: 102 mEq/L (ref 96–112)
Creatinine, Ser: 0.67 mg/dL (ref 0.50–1.10)
GFR calc non Af Amer: >90 mL/min (ref >90)
Glucose, Bld: 101 mg/dL — ABNORMAL HIGH (ref 70–99)
Sodium: 137 mEq/L (ref 135–145)

## 2013-01-04 MED ORDER — ALUM & MAG HYDROXIDE-SIMETH 200-200-20 MG/5ML PO SUSP: 30.0000 mL | ORAL | Status: DC | PRN

## 2013-01-04 MED ORDER — NICOTINE 14 MG/24HR TD PT24
14.0000 mg | MEDICATED_PATCH | Freq: Every day | TRANSDERMAL | Status: DC
  Administered 2013-01-04 – 2013-01-05 (×2): 14 mg via TRANSDERMAL
  Filled 2013-01-04 (×3): qty 1

## 2013-01-04 NOTE — Progress Notes (Addendum)
SUBJECTIVE: The patient is doing well today.  At this time, she denies chest pain, shortness of breath, or any new concerns. She has had no further symptoms.  Neurology evaluation pending today.  Pt does want to wear LifeVest - this has been ordered.  CURRENT MEDICATIONS: . aspirin  81 mg Oral Q breakfast  . calcium-vitamin D  1 tablet Oral Q breakfast  . cholecalciferol  2,000 Units Oral Daily  . clopidogrel  75 mg Oral Q breakfast  . escitalopram  20 mg Oral Daily  . lactulose  30 g Oral BID  . Latitude-TIMI study- Placebo / Losmapimod  (PI- Stuckey )  7.5 mg Oral BID  . lidocaine  1 patch Transdermal Q24H  . lisinopril  5 mg Oral Daily  . loratadine  10 mg Oral Daily  . lovastatin  40 mg Oral q1800  . metoprolol  50 mg Oral BID  . Milnacipran HCl  100 mg Oral BID  . montelukast  10 mg Oral q morning - 10a  . nystatin-triamcinolone  1 application Topical BID  . pantoprazole  40 mg Oral Daily  . rOPINIRole  1 mg Oral QHS  . sodium chloride  3 mL Intravenous Q12H  . tiotropium  1 capsule Inhalation Daily  . traMADol  100 mg Oral BID   . sodium chloride 10 mL/hr at 01/03/13 0400    OBJECTIVE: Physical Exam: Filed Vitals:   01/03/13 1335 01/03/13 1400 01/03/13 2031 01/04/13 0436  BP: 154/68 140/91 146/82 165/93  Pulse:   83 84  Temp:   98.1 F (36.7 C) 97.5 F (36.4 C)  TempSrc:   Oral Oral  Resp:   18 18  Height:      Weight:    179 lb 14.3 oz (81.6 kg)  SpO2:   94% 93%    Intake/Output Summary (Last 24 hours) at 01/04/13 0649 Last data filed at 01/03/13 2130  Gross per 24 hour  Intake  824.5 ml  Output    500 ml  Net  324.5 ml    Telemetry reveals sinus rhythm, occasional PVC's  GEN- The patient is well appearing, alert and oriented x 3 today.   Head- normocephalic, atraumatic Eyes-  Sclera clear, conjunctiva pink Ears- hearing intact Oropharynx- clear Neck- supple, no JVP Lymph- no cervical lymphadenopathy Lungs- Clear to ausculation bilaterally,  normal work of breathing Heart- Regular rate and rhythm, no murmurs, rubs or gallops, PMI not laterally displaced GI- soft, NT, ND, + BS Extremities- no clubbing, cyanosis, or edema Neuro- strength and sensation are intact  LABS: Basic Metabolic Panel:  Recent Labs  16/10/96 0040  NA 136  K 3.5  CL 102  CO2 26  GLUCOSE 126*  BUN 9  CREATININE 0.69  CALCIUM 9.9   CBC:  Recent Labs  01/03/13 0040 01/03/13 1440  WBC 6.6 5.7  HGB 13.7 13.6  HCT 39.3 38.9  MCV 90.6 89.6  PLT 216 218   Cardiac Enzymes:  Recent Labs  01/02/13 1810 01/02/13 2231 01/03/13 0557  TROPONINI 7.17* 3.39* 2.19*   D-Dimer:  Recent Labs  01/03/13 1440  DDIMER 0.42   Fasting Lipid Panel:  Recent Labs  01/03/13 0040  CHOL 152  HDL 46  LDLCALC 78  TRIG 139  CHOLHDL 3.3    RADIOLOGY: Dg Lumbar Spine 2-3 Views  01/03/2013   CLINICAL DATA:  Lower back pain.  Fall.  EXAM: LUMBAR SPINE - 2-3 VIEW  COMPARISON:  None.  FINDINGS: Two views of the  lumbar spine were obtained. There is iodinated contrast within the urinary bladder. Small amount of contrast in the upper renal collecting systems. Calcifications in the pelvis may be related to fibroids. There is a grade 1 anterolisthesis at L5-S1. The vertebral body heights are maintained. Sclerosis at the pubic symphysis.  IMPRESSION: No acute bone abnormality to the lumbar spine.  Mild anterolisthesis at L5-S1 is probably related to facet arthropathy. Limited evaluation for pars defects without oblique images.   Electronically Signed   By: Richarda Overlie M.D.   On: 01/03/2013 16:28   Dg Sacrum/coccyx  01/03/2013   CLINICAL DATA:  Lower back pain.  Fall.  EXAM: SACRUM AND COCCYX - 2+ VIEW  COMPARISON:  Lumbar spine 01/03/2013  FINDINGS: There is iodinated contrast within the urinary bladder. Limited evaluation of the coccyx on the frontal view due to the iodinated contrast. Sclerosis at the pubic symphysis suggests degenerative changes. Symmetric  appearance of the sacroiliac joints. Normal alignment of the sacrum and coccyx on the lateral view. Mild anterolisthesis at L5-S1.  IMPRESSION: No gross abnormality to the sacrum or coccyx.   Electronically Signed   By: Richarda Overlie M.D.   On: 01/03/2013 16:37   Portable Chest X-ray 1 View  01/02/2013   CLINICAL DATA:  Chest pain.  Coronary artery disease.  EXAM: PORTABLE CHEST - 1 VIEW  COMPARISON:  None.  FINDINGS: Heart size and vascularity are within normal limits and the lungs are clear. Coronary artery stents are noted. No osseous abnormality.  IMPRESSION: No acute disease.   Electronically Signed   By: Geanie Cooley M.D.   On: 01/02/2013 18:42    ASSESSMENT AND PLAN:  Principal Problem:   NSTEMI (non-ST elevated myocardial infarction) Active Problems:   Coronary atherosclerosis of native coronary artery   Essential hypertension   Hyperlipidemia   Palpitations   Syncope  1. Syncope History is atypical for an arrhythmogenic event given its prolonged coarse.  Unfortunately she was not wearing her event monitor at the time.  Given her reduced EF, Dr Ladona Ridgel has ordered a LifeVest.  She is instructed to not drive x 6 months.  Neurology is scheduled to see today.  2. Ischemic CM/ chronic systolic dysfunction/ NSTEMI Stable Cath reviewed Will increase lisinopril Repeat echo after 40 days to see if her EF improves.  3. HTN Above goal Increase lisinopril  4. Palpitations Will observe on telemetry while here lifevest at discharge  Hope to discharge tomorrow am

## 2013-01-04 NOTE — Progress Notes (Signed)
Patient educated on the importance of calling staff to get up from the bed due to history of multiple falls; bedside commode present; red socks placed on patient; bed alarm on; husband at bedside; patient verbalizes understanding and will call for help as needed; will continue to monitor   [name], Becky Northrop M

## 2013-01-04 NOTE — Progress Notes (Signed)
1400-1450 Pt states she falls without warning at home. Will await MRI results before ambulating pt. Gave pt CHF booklet and reviewed with husband and pt. Pt does not have scales and said would be a while before she can get. Discussed importance of daily weights and when to notify MD of weight gain. Gave low sodium handouts and MI booklet. Put on lifevest video for them to watch. Instructed pt to not get up by herself and call if she needed to go to bathroom. Duanne Limerick, RN BSN 01/04/2013 2:48 PM

## 2013-01-04 NOTE — Progress Notes (Signed)
EEG completed; results pending.    

## 2013-01-04 NOTE — Consult Note (Signed)
NEURO HOSPITALIST CONSULT NOTE    Reason for Consult: seizure versus syncope  HPI:                                                                                                                                          Becky [name] is an 62 y.o. female with ICM 40%, LBBB, chronic chest pain, COPD followed by cardiology. She has been Rx antianginal medications but this has been limited due to orthostatic intolerance. She was set up for a 7 day event monitor due to hx of palpitations. Per husband, the day prior to admission she was acting strange--talking about fixing a coffee pot that did not need to be fixed.  She was perseverating on the coffee pot all day and night to the point she got up at 0530 the next morning to fix the pot. Husband heard her fall and went to help her. He found her on the floor, no TC activity just unconscious.  He tried to wake her but she would not wake up. He called EMS and they arrived in about 15 minutes.  Still not awake and snoring.  EMS took her to the hospital (now 30 minutes) and at the hospital husband noted she was back to baseline. Husband denies any TC activity, incontinence.  Patient DID bite her tongue. She admits she falls multiple times a week and usually this is precipitated by a feeling of light headed sensation and then legs giving out on her. She is transferred from [facility] for a NSTEMI. The notes indicate she was found down by her husband. There were reports of tongue biting and loss of bladder control. It was not certain if she had a syncopal event or a seizure. Patient underwent a left cardiac catheterization which showed LVEF 25-30% but stable vessel CAD. Event monitor strips were received and showed no sustained arrhythmia and only occasional PVC--however she was not wearing event monitor at time of even. Due to her low EF cardiology has ordered a life vest for patient.   Carotid dopplers showed Right = 1-39% ICA  stenosis, high end of scale. Left = 40-59% ICA stenosis, low end of scale.  She was a normal vaginal birth, no history of seizure in family or herself, no history of febrile seizures.    Past Medical History  Diagnosis Date  . GERD (gastroesophageal reflux disease)   . Hypertension   . HLD (hyperlipidemia)   . CAD (coronary artery disease)     a. 5/08: s/p DES to PDA and DES to LAD; b. 1/09: s/p DES to pLAD; c. 1/11: s/p DES to LAD x 2, d. LHC (3/12 in [place] - EF 55%, mild ant HK, nl LM, LAD stents ok, oLAD 30, RCA stents ok;  e.  4/13:  s/p DES to RCA, f. 1/14: dLM 20-30, mOM3 20-30, mLAD 80 ISR => s/p 2.5x16 mm Promus Element DES; EF 45-50%   . LBBB (left bundle branch block)   . History of depression   . COPD (chronic obstructive pulmonary disease)   . Ischemic cardiomyopathy     a. Echo (2/12):  EF 40%, anteroseptal HK  . Fibromyalgia   . Heart murmur     Past Surgical History  Procedure Laterality Date  . Angioplasty      stent 2011, 2010, 2009  . Orif ankle fracture  [id number]  . Tubal ligation  1989  . Coronary stent placement  02/2012    Family History  Problem Relation Age of Onset  . Breast cancer Mother   . Heart failure Brother   . Breast cancer Sister   . Breast cancer Sister   . Uterine cancer Sister      Social History:  reports that she has been smoking Cigarettes.  She has been smoking about 0.50 packs per day. She does not have any smokeless tobacco history on file. She reports that she does not drink alcohol. Her drug history is not on file.  Allergies  Allergen Reactions  . Tape Rash and Other (See Comments)    Severe Reaction (latex tape): Burned hand, Redness-took days to clear up (03/02/12)  . Codeine Nausea And Vomiting  . Prozac [Fluoxetine Hcl] Other (See Comments)    Mean/violent thoughts  . Fosamax [Alendronate Sodium] Nausea Only and Other (See Comments)    Stomach pain  . Keflex [Cephalexin] Nausea Only  . Lipitor [Atorvastatin]  Other (See Comments)    Leg pain    MEDICATIONS:                                                                                                                     Prior to Admission:  Prescriptions prior to admission  Medication Sig Dispense Refill  . albuterol (PROAIR HFA) 108 (90 BASE) MCG/ACT inhaler Inhale 2 puffs into the lungs every 4 (four) hours as needed for shortness of breath.       Marland Kitchen aspirin 81 MG chewable tablet Chew 81 mg by mouth daily with breakfast.      . Calcium Carbonate-Vit D-Min (CALCIUM 1200) 1200-1000 MG-UNIT CHEW Chew 1 each by mouth daily.      . cetirizine (ZYRTEC) 10 MG tablet Take 10 mg by mouth daily.      . Cholecalciferol (VITAMIN D) 2000 UNITS CAPS Take 2,000 Units by mouth daily.       . clopidogrel (PLAVIX) 75 MG tablet Take 75 mg by mouth daily.       Marland Kitchen DEXILANT 60 MG capsule Take 60 mg by mouth daily.       Marland Kitchen escitalopram (LEXAPRO) 20 MG tablet Take 20 mg by mouth daily.       Marland Kitchen HYDROcodone-acetaminophen (VICODIN) 5-500 MG per tablet Take 1 tablet by mouth every 4 (four) hours as needed for pain.      Marland Kitchen  ibuprofen (ADVIL,MOTRIN) 800 MG tablet Take 800 mg by mouth every 8 (eight) hours as needed for moderate pain.      Marland Kitchen lactulose (CHRONULAC) 10 GM/15ML solution Take 30 g by mouth 2 (two) times daily.       Marland Kitchen lidocaine (LIDODERM) 5 % Place 1 patch onto the skin daily. Remove after 12 hours      . lisinopril-hydrochlorothiazide (PRINZIDE,ZESTORETIC) 10-12.5 MG per tablet Take 0.5 tablets by mouth daily.       Marland Kitchen lovastatin (MEVACOR) 40 MG tablet Take 40 mg by mouth daily after breakfast.       . metoprolol (LOPRESSOR) 50 MG tablet Take 50 mg by mouth 2 (two) times daily.       . montelukast (SINGULAIR) 10 MG tablet Take 10 mg by mouth every morning.       . nitroGLYCERIN (NITROSTAT) 0.4 MG SL tablet Place 1 tablet (0.4 mg total) under the tongue every 5 (five) minutes x 3 doses as needed. If no relief after 3rd dose, proceed to ED  25 tablet  11  .  nystatin-triamcinolone (MYCOLOG II) cream Apply 1 application topically daily as needed (for rash/inflammation).       Marland Kitchen oxymetazoline (AFRIN) 0.05 % nasal spray Place 2 sprays into the nose at bedtime.      Marland Kitchen rOPINIRole (REQUIP) 0.5 MG tablet Take 1 mg by mouth at bedtime. Takes 2 tablets      . SAVELLA 100 MG TABS tablet Take 100 mg by mouth 2 (two) times daily.       Marland Kitchen SPIRIVA HANDIHALER 18 MCG inhalation capsule Place 1 capsule into inhaler and inhale daily.      . traMADol (ULTRAM) 50 MG tablet Take 2 tablets by mouth 2 (two) times daily.       . zoledronic acid (RECLAST) 5 MG/100ML SOLN injection Inject 5 mg into the vein once.        Scheduled: . aspirin  81 mg Oral Q breakfast  . calcium-vitamin D  1 tablet Oral Q breakfast  . cholecalciferol  2,000 Units Oral Daily  . clopidogrel  75 mg Oral Q breakfast  . escitalopram  20 mg Oral Daily  . lactulose  30 g Oral BID  . Latitude-TIMI study- Placebo / Losmapimod  (PI- Stuckey )  7.5 mg Oral BID  . lidocaine  1 patch Transdermal Q24H  . lisinopril  5 mg Oral Daily  . loratadine  10 mg Oral Daily  . lovastatin  40 mg Oral q1800  . metoprolol  50 mg Oral BID  . Milnacipran HCl  100 mg Oral BID  . montelukast  10 mg Oral q morning - 10a  . nystatin-triamcinolone  1 application Topical BID  . pantoprazole  40 mg Oral Daily  . rOPINIRole  1 mg Oral QHS  . sodium chloride  3 mL Intravenous Q12H  . tiotropium  1 capsule Inhalation Daily  . traMADol  100 mg Oral BID   Continuous: . sodium chloride 10 mL/hr at 01/03/13 0400   ZOX:WRUEAV chloride, acetaminophen, albuterol, HYDROcodone-acetaminophen, nitroGLYCERIN, ondansetron (ZOFRAN) IV, sodium chloride   ROS:  History obtained from the patient  General ROS: negative for - chills, fatigue, fever, night sweats, weight gain or weight loss Psychological  ROS: negative for - behavioral disorder, hallucinations, memory difficulties, mood swings or suicidal ideation Ophthalmic ROS: negative for - blurry vision, double vision, eye pain or loss of vision ENT ROS: negative for - epistaxis, nasal discharge, oral lesions, sore throat, tinnitus or vertigo Allergy and Immunology ROS: negative for - hives or itchy/watery eyes Hematological and Lymphatic ROS: negative for - bleeding problems, bruising or swollen lymph nodes Endocrine ROS: negative for - galactorrhea, hair pattern changes, polydipsia/polyuria or temperature intolerance Respiratory ROS: negative for - cough, hemoptysis, shortness of breath or wheezing Cardiovascular ROS: negative for - chest pain, dyspnea on exertion, edema or irregular heartbeat Gastrointestinal ROS: negative for - abdominal pain, diarrhea, hematemesis, nausea/vomiting or stool incontinence Genito-Urinary ROS: negative for - dysuria, hematuria, incontinence or urinary frequency/urgency Musculoskeletal ROS: negative for - joint swelling or muscular weakness Neurological ROS: as noted in HPI Dermatological ROS: negative for rash and skin lesion changes   Blood pressure 165/93, pulse 84, temperature 97.5 F (36.4 C), temperature source Oral, resp. rate 18, height 5' (1.524 m), weight 81.6 kg (179 lb 14.3 oz), SpO2 93.00%.   Neurologic Examination:                                                                                                      Mental Status: Alert, oriented, thought content appropriate.  Speech fluent without evidence of aphasia.  Able to follow 3 step commands without difficulty. Cranial Nerves: II: Discs flat bilaterally; Visual fields grossly normal, pupils equal, round, reactive to light and accommodation III,IV, VI: ptosis not present, extra-ocular motions intact bilaterally V,VII: smile symmetric, facial light touch sensation normal bilaterally VIII: hearing normal bilaterally IX,X: gag reflex  present XI: bilateral shoulder shrug XII: midline tongue extension without atrophy or fasciculations  Motor: Right : Upper extremity   5/5    Left:     Upper extremity   5/5  Lower extremity   5/5     Lower extremity   5/5 Tone and bulk:normal tone throughout; no atrophy noted Sensory: Pinprick, vibration, proprioception and light touch intact throughout, bilaterally Deep Tendon Reflexes:  Right: Upper Extremity   Left: Upper extremity   biceps (C-5 to C-6) 2/4   biceps (C-5 to C-6) 2/4 tricep (C7) 2/4    triceps (C7) 2/4 Brachioradialis (C6) 2/4  Brachioradialis (C6) 2/4  Lower Extremity Lower Extremity  quadriceps (L-2 to L-4) 2/4   quadriceps (L-2 to L-4) 2/4 Achilles (S1) 2/4   Achilles (S1) 2/4  Plantars: Right: downgoing   Left: downgoing Cerebellar: normal finger-to-nose,  normal heel-to-shin test Gait: not tested CV: pulses palpable throughout    Lab Results  Component Value Date/Time   CHOL 152 01/03/2013 12:40 AM    Results for orders placed during the hospital encounter of 01/02/13 (from the past 48 hour(s))  MRSA PCR SCREENING     Status: None   Collection Time    01/02/13  3:45 PM  Result Value Range   MRSA by PCR NEGATIVE  NEGATIVE   Comment:            The GeneXpert MRSA Assay (FDA     approved for NASAL specimens     only), is one component of a     comprehensive MRSA colonization     surveillance program. It is not     intended to diagnose MRSA     infection nor to guide or     monitor treatment for     MRSA infections.  TROPONIN I     Status: Abnormal   Collection Time    01/02/13  6:10 PM      Result Value Range   Troponin I 7.17 (*) <0.30 ng/mL   Comment:            Due to the release kinetics of cTnI,     a negative result within the first hours     of the onset of symptoms does not rule out     myocardial infarction with certainty.     If myocardial infarction is still suspected,     repeat the test at appropriate intervals.      REPEATED TO VERIFY     CRITICAL RESULT CALLED TO, READ BACK BY AND VERIFIED WITH:     DUPOINT,C RN 01/02/13 1928 WOOTEN,K  TROPONIN I     Status: Abnormal   Collection Time    01/02/13 10:31 PM      Result Value Range   Troponin I 3.39 (*) <0.30 ng/mL   Comment:            Due to the release kinetics of cTnI,     a negative result within the first hours     of the onset of symptoms does not rule out     myocardial infarction with certainty.     If myocardial infarction is still suspected,     repeat the test at appropriate intervals.     REPEATED TO VERIFY     CRITICAL VALUE NOTED.  VALUE IS CONSISTENT WITH PREVIOUSLY REPORTED AND CALLED VALUE.  BASIC METABOLIC PANEL     Status: Abnormal   Collection Time    01/03/13 12:40 AM      Result Value Range   Sodium 136  135 - 145 mEq/L   Potassium 3.5  3.5 - 5.1 mEq/L   Chloride 102  96 - 112 mEq/L   CO2 26  19 - 32 mEq/L   Glucose, Bld 126 (*) 70 - 99 mg/dL   BUN 9  6 - 23 mg/dL   Creatinine, Ser 4.54  0.50 - 1.10 mg/dL   Calcium 9.9  8.4 - 09.8 mg/dL   GFR calc non Af Amer >90  >90 mL/min   GFR calc Af Amer >90  >90 mL/min   Comment: (NOTE)     The eGFR has been calculated using the CKD EPI equation.     This calculation has not been validated in all clinical situations.     eGFR's persistently <90 mL/min signify possible Chronic Kidney     Disease.  HEPARIN LEVEL (UNFRACTIONATED)     Status: None   Collection Time    01/03/13 12:40 AM      Result Value Range   Heparin Unfractionated 0.68  0.30 - 0.70 IU/mL   Comment:            IF HEPARIN RESULTS ARE BELOW     EXPECTED  VALUES, AND PATIENT     DOSAGE HAS BEEN CONFIRMED,     SUGGEST FOLLOW UP TESTING     OF ANTITHROMBIN III LEVELS.  CBC     Status: None   Collection Time    01/03/13 12:40 AM      Result Value Range   WBC 6.6  4.0 - 10.5 K/uL   RBC 4.34  3.87 - 5.11 MIL/uL   Hemoglobin 13.7  12.0 - 15.0 g/dL   HCT 81.1  91.4 - 78.2 %   MCV 90.6  78.0 - 100.0 fL   MCH  31.6  26.0 - 34.0 pg   MCHC 34.9  30.0 - 36.0 g/dL   RDW 95.6  21.3 - 08.6 %   Platelets 216  150 - 400 K/uL  LIPID PANEL     Status: None   Collection Time    01/03/13 12:40 AM      Result Value Range   Cholesterol 152  0 - 200 mg/dL   Triglycerides 578  <469 mg/dL   HDL 46  >62 mg/dL   Total CHOL/HDL Ratio 3.3     VLDL 28  0 - 40 mg/dL   LDL Cholesterol 78  0 - 99 mg/dL   Comment:            Total Cholesterol/HDL:CHD Risk     Coronary Heart Disease Risk Table                         Men   Women      1/2 Average Risk   3.4   3.3      Average Risk       5.0   4.4      2 X Average Risk   9.6   7.1      3 X Average Risk  23.4   11.0                Use the calculated Patient Ratio     above and the CHD Risk Table     to determine the patient's CHD Risk.                ATP III CLASSIFICATION (LDL):      <100     mg/dL   Optimal      952-841  mg/dL   Near or Above                        Optimal      130-159  mg/dL   Borderline      324-401  mg/dL   High      >027     mg/dL   Very High  TROPONIN I     Status: Abnormal   Collection Time    01/03/13  5:57 AM      Result Value Range   Troponin I 2.19 (*) <0.30 ng/mL   Comment:            Due to the release kinetics of cTnI,     a negative result within the first hours     of the onset of symptoms does not rule out     myocardial infarction with certainty.     If myocardial infarction is still suspected,     repeat the test at appropriate intervals.     REPEATED TO VERIFY     CRITICAL VALUE NOTED.  VALUE IS CONSISTENT  WITH PREVIOUSLY REPORTED AND CALLED VALUE.  HEPARIN LEVEL (UNFRACTIONATED)     Status: Abnormal   Collection Time    01/03/13 10:10 AM      Result Value Range   Heparin Unfractionated 0.26 (*) 0.30 - 0.70 IU/mL   Comment:            IF HEPARIN RESULTS ARE BELOW     EXPECTED VALUES, AND PATIENT     DOSAGE HAS BEEN CONFIRMED,     SUGGEST FOLLOW UP TESTING     OF ANTITHROMBIN III LEVELS.  PROTIME-INR      Status: None   Collection Time    01/03/13 10:10 AM      Result Value Range   Prothrombin Time 12.3  11.6 - 15.2 seconds   INR 0.93  0.00 - 1.49  PLATELET INHIBITION P2Y12     Status: Abnormal   Collection Time    01/03/13 12:00 PM      Result Value Range   Platelet Function  P2Y12 121 (*) 194 - 418 PRU   Comment:            The literature has shown a direct     correlation of PRU values over     230 with higher risks of     thrombotic events.  Lower PRU     values are associated with     platelet inhibition.  D-DIMER, QUANTITATIVE     Status: None   Collection Time    01/03/13  2:40 PM      Result Value Range   D-Dimer, Quant 0.42  0.00 - 0.48 ug/mL-FEU   Comment:            AT THE INHOUSE ESTABLISHED CUTOFF     VALUE OF 0.48 ug/mL FEU,     THIS ASSAY HAS BEEN DOCUMENTED     IN THE LITERATURE TO HAVE     A SENSITIVITY AND NEGATIVE     PREDICTIVE VALUE OF AT LEAST     98 TO 99%.  THE TEST RESULT     SHOULD BE CORRELATED WITH     AN ASSESSMENT OF THE CLINICAL     PROBABILITY OF DVT / VTE.  CBC     Status: None   Collection Time    01/03/13  2:40 PM      Result Value Range   WBC 5.7  4.0 - 10.5 K/uL   RBC 4.34  3.87 - 5.11 MIL/uL   Hemoglobin 13.6  12.0 - 15.0 g/dL   HCT 16.1  09.6 - 04.5 %   MCV 89.6  78.0 - 100.0 fL   MCH 31.3  26.0 - 34.0 pg   MCHC 35.0  30.0 - 36.0 g/dL   RDW 40.9  81.1 - 91.4 %   Platelets 218  150 - 400 K/uL  CBC     Status: None   Collection Time    01/04/13  6:00 AM      Result Value Range   WBC 6.6  4.0 - 10.5 K/uL   Comment: WHITE COUNT CONFIRMED ON SMEAR     REPEATED TO VERIFY   RBC 4.40  3.87 - 5.11 MIL/uL   Hemoglobin 13.9  12.0 - 15.0 g/dL   HCT 78.2  95.6 - 21.3 %   MCV 88.2  78.0 - 100.0 fL   MCH 31.6  26.0 - 34.0 pg   MCHC 35.8  30.0 - 36.0 g/dL   RDW 08.6  57.8 - 46.9 %  Platelets 219  150 - 400 K/uL  BASIC METABOLIC PANEL     Status: Abnormal   Collection Time    01/04/13  6:00 AM      Result Value Range   Sodium 137   135 - 145 mEq/L   Potassium 3.9  3.5 - 5.1 mEq/L   Comment: HEMOLYSIS AT THIS LEVEL MAY AFFECT RESULT   Chloride 102  96 - 112 mEq/L   CO2 22  19 - 32 mEq/L   Glucose, Bld 101 (*) 70 - 99 mg/dL   BUN 9  6 - 23 mg/dL   Creatinine, Ser 4.54  0.50 - 1.10 mg/dL   Calcium 09.8  8.4 - 11.9 mg/dL   GFR calc non Af Amer >90  >90 mL/min   GFR calc Af Amer >90  >90 mL/min   Comment: (NOTE)     The eGFR has been calculated using the CKD EPI equation.     This calculation has not been validated in all clinical situations.     eGFR's persistently <90 mL/min signify possible Chronic Kidney     Disease.    Dg Lumbar Spine 2-3 Views  01/03/2013   CLINICAL DATA:  Lower back pain.  Fall.  EXAM: LUMBAR SPINE - 2-3 VIEW  COMPARISON:  None.  FINDINGS: Two views of the lumbar spine were obtained. There is iodinated contrast within the urinary bladder. Small amount of contrast in the upper renal collecting systems. Calcifications in the pelvis may be related to fibroids. There is a grade 1 anterolisthesis at L5-S1. The vertebral body heights are maintained. Sclerosis at the pubic symphysis.  IMPRESSION: No acute bone abnormality to the lumbar spine.  Mild anterolisthesis at L5-S1 is probably related to facet arthropathy. Limited evaluation for pars defects without oblique images.   Electronically Signed   By: Richarda Overlie M.D.   On: 01/03/2013 16:28   Dg Sacrum/coccyx  01/03/2013   CLINICAL DATA:  Lower back pain.  Fall.  EXAM: SACRUM AND COCCYX - 2+ VIEW  COMPARISON:  Lumbar spine 01/03/2013  FINDINGS: There is iodinated contrast within the urinary bladder. Limited evaluation of the coccyx on the frontal view due to the iodinated contrast. Sclerosis at the pubic symphysis suggests degenerative changes. Symmetric appearance of the sacroiliac joints. Normal alignment of the sacrum and coccyx on the lateral view. Mild anterolisthesis at L5-S1.  IMPRESSION: No gross abnormality to the sacrum or coccyx.   Electronically  Signed   By: Richarda Overlie M.D.   On: 01/03/2013 16:37   Portable Chest X-ray 1 View  01/02/2013   CLINICAL DATA:  Chest pain.  Coronary artery disease.  EXAM: PORTABLE CHEST - 1 VIEW  COMPARISON:  None.  FINDINGS: Heart size and vascularity are within normal limits and the lungs are clear. Coronary artery stents are noted. No osseous abnormality.  IMPRESSION: No acute disease.   Electronically Signed   By: Geanie Cooley M.D.   On: 01/02/2013 18:42    Assessment and plan per attending neurologist  Felicie Morn PA-C [facility] [phone]  01/04/2013, 9:13 AM   Assessment/Plan:  62 YO female with history of multiple falls, all described as feeling woozy and then legs going out from her.  Patient cannot recall event causing admission but husband noted she was found on floor, snoring, non-responsive for at least 15 minutes. Upon resolution patient was alert and oriented. Likely syncopal event but given prolonged nature of AMS cannot rule out Seizure.   Recommend: 1) MRI brain  with and without contrast.  2) EEG  Will make further recommendations after studies performed.   I personally participated in this patient's evaluation and management, including formulating about clinical impression and management recommendations.  Venetia Maxon M.D. [facility] [phone]

## 2013-01-04 NOTE — Progress Notes (Signed)
Orthostatic vitals completed: 134/57 BP 73 HR Lying 142/84 BP 82 HR Sitting 111/77 BP 78 HR Standing  BARNETT, Becky [name]

## 2013-01-04 NOTE — Procedures (Signed)
ELECTROENCEPHALOGRAM REPORT   Patient: Becky [name]       Room #: 1O10 EEG No. ID: [id number] Age: 62 y.o.        Sex: female Referring Physician: Bensimhon Report Date:  01/04/2013        Interpreting Physician: Aline Brochure  History: Becky [name] is an 61 y.o. female who has been experiencing recurrent episodes of loss of consciousness as well as recurrent falls.  Indications for study:  Rule out seizure disorder.  Technique: This is an 18 channel routine scalp EEG performed at the bedside with bipolar and monopolar montages arranged in accordance to the international 10/20 system of electrode placement.   Description: This EEG recording was performed during wakefulness. Predominant background activity consisted of 9 Hz symmetrical posterior rhythm which attenuates well with eye opening. Photic stimulation was not performed. Hyperventilation was not performed. No epileptiform discharges recorded. There were no areas of abnormal slowing of cerebral activity.  Interpretation: This is a normal EEG recording during wakefulness. No evidence of an epileptic disorder is demonstrated.   Venetia Maxon M.D. [facility] [phone]

## 2013-01-05 ENCOUNTER — Inpatient Hospital Stay (HOSPITAL_COMMUNITY): Payer: BC Managed Care – PPO

## 2013-01-05 ENCOUNTER — Ambulatory Visit: Payer: BC Managed Care – PPO | Admitting: Cardiology

## 2013-01-05 LAB — CBC
MCHC: 35.2 g/dL (ref 30.0–36.0)
Platelets: 219 10*3/uL (ref 150–400)
RBC: 4.25 MIL/uL (ref 3.87–5.11)
WBC: 6.4 10*3/uL (ref 4.0–10.5)

## 2013-01-05 MED ORDER — STUDY - INVESTIGATIONAL DRUG SIMPLE RECORD: 7.5000 mg | Freq: Two times a day (BID) | Status: DC

## 2013-01-05 MED ORDER — GADOBENATE DIMEGLUMINE 529 MG/ML IV SOLN
20.0000 mL | Freq: Once | INTRAVENOUS | Status: AC | PRN
  Administered 2013-01-05: 17 mL via INTRAVENOUS

## 2013-01-05 MED ORDER — PANTOPRAZOLE SODIUM 40 MG PO TBEC: 40.0000 mg | DELAYED_RELEASE_TABLET | Freq: Every day | ORAL | Status: DC

## 2013-01-05 MED ORDER — LISINOPRIL 5 MG PO TABS: 5.0000 mg | ORAL_TABLET | Freq: Every day | ORAL | Status: DC

## 2013-01-05 MED ORDER — LOVASTATIN 40 MG PO TABS: 40.0000 mg | ORAL_TABLET | Freq: Every day | ORAL | Status: DC

## 2013-01-05 NOTE — Progress Notes (Signed)
Patient ID: Becky [name], female   DOB: April 16, 1950, 62 y.o.   MRN: [id number] Subjective:  Denies chest pain or sob  Objective:  Vital Signs in the last 24 hours: Temp:  [97.8 F (36.6 C)-98.2 F (36.8 C)] 97.8 F (36.6 C) (11/19 0554) Pulse Rate:  [65-82] 65 (11/19 0554) Resp:  [18] 18 (11/19 0554) BP: (111-156)/(57-84) 146/70 mmHg (11/19 0554) SpO2:  [97 %-99 %] 99 % (11/19 0554)  Intake/Output from previous day: 11/18 0701 - 11/19 0700 In: 1324 [P.O.:1080; I.V.:244] Out: -  Intake/Output from this shift:    Physical Exam: Well appearing middle aged woman, NAD HEENT: Unremarkable Neck:  No JVD, no thyromegally Lymphatics:  No adenopathy Back:  No CVA tenderness Lungs:  Clear with no wheezes, rales, rhonchi HEART:  Regular rate rhythm, no murmurs, no rubs, no clicks Abd:  Soft, obese, positive bowel sounds, no organomegally, no rebound, no guarding Ext:  2 plus pulses, no edema, no cyanosis, no clubbing Skin:  No rashes no nodules Neuro:  CN II through XII intact, motor grossly intact  Lab Results:  Recent Labs  01/03/13 1440 01/04/13 0600  WBC 5.7 6.6  HGB 13.6 13.9  PLT 218 219    Recent Labs  01/03/13 0040 01/04/13 0600  NA 136 137  K 3.5 3.9  CL 102 102  CO2 26 22  GLUCOSE 126* 101*  BUN 9 9  CREATININE 0.69 0.67    Recent Labs  01/02/13 2231 01/03/13 0557  TROPONINI 3.39* 2.19*   Hepatic Function Panel No results found for this basename: PROT, ALBUMIN, AST, ALT, ALKPHOS, BILITOT, BILIDIR, IBILI,  in the last 72 hours  Recent Labs  01/03/13 0040  CHOL 152   No results found for this basename: PROTIME,  in the last 72 hours  Imaging: Dg Lumbar Spine 2-3 Views  01/03/2013   CLINICAL DATA:  Lower back pain.  Fall.  EXAM: LUMBAR SPINE - 2-3 VIEW  COMPARISON:  None.  FINDINGS: Two views of the lumbar spine were obtained. There is iodinated contrast within the urinary bladder. Small amount of contrast in the upper renal collecting  systems. Calcifications in the pelvis may be related to fibroids. There is a grade 1 anterolisthesis at L5-S1. The vertebral body heights are maintained. Sclerosis at the pubic symphysis.  IMPRESSION: No acute bone abnormality to the lumbar spine.  Mild anterolisthesis at L5-S1 is probably related to facet arthropathy. Limited evaluation for pars defects without oblique images.   Electronically Signed   By: Richarda Overlie M.D.   On: 01/03/2013 16:28   Dg Sacrum/coccyx  01/03/2013   CLINICAL DATA:  Lower back pain.  Fall.  EXAM: SACRUM AND COCCYX - 2+ VIEW  COMPARISON:  Lumbar spine 01/03/2013  FINDINGS: There is iodinated contrast within the urinary bladder. Limited evaluation of the coccyx on the frontal view due to the iodinated contrast. Sclerosis at the pubic symphysis suggests degenerative changes. Symmetric appearance of the sacroiliac joints. Normal alignment of the sacrum and coccyx on the lateral view. Mild anterolisthesis at L5-S1.  IMPRESSION: No gross abnormality to the sacrum or coccyx.   Electronically Signed   By: Richarda Overlie M.D.   On: 01/03/2013 16:37    Cardiac Studies: Tele - nsr Assessment/Plan:  1. ICM 2. NSTEMI 3. Syncope vs seizures Rec: ready for discharge home. MRI pending. Will dc after this is performed.  Continue current meds. Followup with Dr. Wyline Mood in [place] in 2 weeks and with Dr. Johney Frame in 2 months.  LOS: 3 days    Jaevin Medearis,M.D. 01/05/2013, 7:08 AM

## 2013-01-05 NOTE — Discharge Summary (Signed)
CARDIOLOGY DISCHARGE SUMMARY    Patient ID: Becky [name],  MRN: [id number], DOB/AGE: November 27, 1950 62 y.o.  Admit date: 01/02/2013 Discharge date: 01/05/2013  Primary Care Physician: Fanny Dance, MD   Primary Cardiologist: Wyline Mood, MD  Primary Discharge Diagnosis:  1. Syncope   -History is atypical for an arrhythmogenic event given its prolonged course  -Unfortunately she was not wearing her event monitor at the time   -Given her reduced EF, LifeVest was ordered  -No driving x 6 months 2. Ischemic CM with chronic systolic HF  -Stable   -Increased lisinopril to optimize medical regimen  -Repeat echo after 40 days to see if her EF improves 3. CAD / NSTEMI  -Cath revealed stable CAD  -Medical management recommended 4. Orthostatic hypotension and frequent falls  Secondary Discharge Diagnoses:  1. HTN 2. COPD 3. Dyslipidemia 4. LBBB 5. Depression  Procedures This Admission:  Orthostatic VS      Lying BP 134/57 pulse 73      Sitting BP 142/84 pulse 82      Standing BP 111/77 pulse 78  Carotid Doppler study 01/03/2013 Summary: - The vertebral arteries appear patent with antegrade flow. - Findings consistent with1- 39 percent stenosis,high end of scale,involving the right internal carotid artery. - Findings consistent with 40 - 59 percent stenosis, low end of scale,involving the left internal carotid artery. - ICA/CCA ratio. right =1.72. left = 1.52  2D echocardiogram 01/03/2013 Study Conclusions - Left ventricle: The cavity size was normal. There was mild focal basal hypertrophy of the septum. Systolic function was severely reduced. The estimated ejection fraction was in the range of 25% to 30%. There is akinesis of the anteroseptal and apical myocardium. Doppler parameters are consistent with abnormal left ventricular relaxation (grade 1 diastolic dysfunction). - Mitral valve: Calcified annulus. Impression: - No pior study for comparison.  Cardiac  catheterization 01/03/2013 Hemodynamic Findings:  Central aortic pressure: 147/79  Left ventricular pressure: 130/22/26  Angiographic Findings:  Left main: 30% distal stenosis. It appears that the distal left main has a stent that continues into the LAD.  Left Anterior Descending Artery: Large vessel that courses to the apex. The entire proximal and mid LAD is stented. There is mild stent restenosis in the proximal segment. The mid stented segment has diffuse 30% stent restenosis. The distal vessel becomes small in caliber and has mild diffuse plaque. There is a moderate caliber first diagonal branch with mild plaque disease.  Circumflex Artery: Moderate caliber non-dominant vessel with three moderate caliber obtuse marginal branches. No obstructive disease.  Right Coronary Artery: Large dominant vessel with 30% proximal stenosis, heavily calcified mid vessel with patent stent in the mid vessel (no significant restenosis). The distal vessel has diffuse 40-50% stenosis. The posterolateral branch and PDA are moderate caliber, patent vessels with mild plaque disease.  Left Ventricular Angiogram: LVEF=25-30%. Hypokinesis of the antero-apical wall.  Impression:  1. 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 Elevated troponin following syncopal event. No culprit lesions seen.  3. Moderate to severe LV systolic dysfunction.  Recommendations: Continue medical management of CAD. Workup for syncope is underway. Carotid dopplers today. Will check d-dimer as PE is a possibility. She could also have had an arrythmia with LV dysfunction.  Complications: None. The patient tolerated the procedure well.   EEG 01/04/2013 Description:  This EEG recording was performed during wakefulness. Predominant background activity consisted of 9 Hz symmetrical posterior rhythm which attenuates well with eye opening. Photic stimulation was not  performed. Hyperventilation was not performed. No epileptiform  discharges recorded. There were no areas of abnormal slowing of cerebral activity.  Interpretation:  This is a normal EEG recording during wakefulness. No evidence of an epileptic disorder is demonstrated.   Brain MRI 01/05/2013 FINDINGS: Diffusion imaging does not show any acute or subacute infarction. There chronic small vessel changes throughout the pons. No focal cerebellar insult. The cerebral hemispheres show old lacunar infarctions within the basal ganglia and thalami and moderate chronic small-vessel changes affecting the deep white matter. No cortical or large vessel territory infarction. No mass lesion, hemorrhage, hydrocephalus or extra-axial collection. No abnormal contrast enhancement. No pituitary mass. No inflammatory sinus disease. No skull or skullbase lesion. IMPRESSION: No cause of seizure identified. No acute infarction. Extensive chronic ischemic changes throughout the brain.  History: Per admission H&P, Becky [name] is a 62 year old woman with CAD s/p multiple PCI procedures in the past ([place], [place]), ICM EF 40%, LBBB, chronic daily chest pain for ~2 years, HTN, depression and COPD. She recently established care with Dr. Dina Rich in our [place] office. She reported no real improvement in chest pain with recent PCI procedures. Antianginal Rx was limited by orthostatic intolerance. She reported palpitations as well and was set up for a 7-day event monitor. She was asked to decrease caffeine intake. On 01/02/2013, she was [facility] for [facility]STEMI. The notes indicated she was found unresponsive by her husband. In addition, there were reports of tongue biting and loss of bladder control. It was not certain if she had a syncopal event or seizure. She was not wearing her event monitor at the time. Data from [place]: Head CT: Unremarkable. Neck CT: No fracture; + multilevel DDD. K+ 3. Serum creatinine 0.74. WBC 10,000. Hgb 14.5. PLT 233,000.  Troponin 0.12 => 2.09.   Hospital Course:  Becky [name] was admitted to step down with syncope vs seizure and NSTEMI. Peak troponin 7.17. Orthostatic VS were positive. Carotid Dopplers did not show any significant stenoses. An echo was done revealing LV dysfunction, EF 25-30% with akinesis of the anteroseptal and apical myocardium. She then underwent cardiac catheterization with results as outlined above. Her CAD was felt to be stable with patent stents. Medical management was recommended. Of note, she was enrolled into the [facility]-TIMI study. PE ruled out with negative D-dimer. Neurology was consulted. EEG was done and normal. MRI was done which did not show any acute abnormality. She did not have any arrhythmias documented on telemetry. However, given her known LV dysfunction, arrhythmia could not definitely be excluded. Therefore, a LifeVest was ordered with plans to reassess LV function and evaluate for ICD in 2 months. Her medical therapy was optimized. She remained hemodynamically stable. She has been seen, examined and deemed stable for DC home today by Dr. Lewayne Bunting. She will follow-up with Dr. Wyline Mood next week and with Dr. Johney Frame in 2 months (she lives in [place], [place]).  Discharge Vitals: Blood pressure 146/70, pulse 65, temperature 97.8 F (36.6 C), temperature source Oral, resp. rate 18, height 5' (1.524 m), weight 179 lb 14.3 oz (81.6 kg), SpO2 99.00%.   Labs: Lab Results  Component Value Date   WBC 6.6 01/04/2013   HGB 13.9 01/04/2013   HCT 38.8 01/04/2013   MCV 88.2 01/04/2013   PLT 219 01/04/2013     Recent Labs Lab 01/04/13 0600  NA 137  K 3.9  CL 102  CO2 22  BUN 9  CREATININE 0.67  CALCIUM 10.0  GLUCOSE 101*   Lab Results  Component Value Date   TROPONINI 2.19* 01/03/2013    Lab Results  Component Value Date   CHOL 152 01/03/2013   Lab Results  Component Value Date   HDL 46 01/03/2013   Lab Results  Component Value Date   LDLCALC 78 01/03/2013    Lab Results  Component Value Date   TRIG 139 01/03/2013   Lab Results  Component Value Date   CHOLHDL 3.3 01/03/2013   No results found for this basename: LDLDIRECT   Lab Results  Component Value Date   DDIMER 0.42 01/03/2013    Recent Labs  01/03/13 1010  INR 0.93    Disposition:  The patient is being discharged in stable condition.  Follow-up:     Follow-up Information   Follow up with Antoine Poche, MD On 01/11/2013. (At 3:00 PM for hospital follow-up)    Specialty:  Cardiology   Contact information:   [address] [place] [address] [phone]       Follow up with [facility] On 03/18/2013. (At 8:30 AM to see Dr. Johney Frame (EP consult for ICD))    Specialty:  Cardiology   Contact information:   [address] [place] [address] [phone]      Follow up with [facility] On 02/03/2013. (At 10:00 AM for research study visit (will be done at [facility]))    Specialty:  Cardiology   Contact information:   [address] [id number] [place] [address] [phone]     Discharge Medications:    Medication List    STOP taking these medications       DEXILANT 60 MG capsule  Generic drug:  dexlansoprazole  Replaced by:  pantoprazole 40 MG tablet     ibuprofen 800 MG tablet  Commonly known as:  ADVIL,MOTRIN     lisinopril-hydrochlorothiazide 10-12.5 MG per tablet  Commonly known as:  PRINZIDE,ZESTORETIC     oxymetazoline 0.05 % nasal spray  Commonly known as:  AFRIN      TAKE these medications       aspirin 81 MG chewable tablet  Chew 81 mg by mouth daily with breakfast.     CALCIUM 1200 1200-1000 MG-UNIT Chew  Chew 1 each by mouth daily.     cetirizine 10 MG tablet  Commonly known as:  ZYRTEC  Take 10 mg by mouth daily.     clopidogrel 75 MG tablet  Commonly known as:  PLAVIX  Take 75 mg by  mouth daily.     escitalopram 20 MG tablet  Commonly known as:  LEXAPRO  Take 20 mg by mouth daily.     HYDROcodone-acetaminophen 5-500 MG per tablet  Commonly known as:  VICODIN  Take 1 tablet by mouth every 4 (four) hours as needed for pain.     INVESTIGATIONAL DRUG SIMPLE RECORD  Take 7.5 mg by mouth 2 (two) times daily.     lactulose 10 GM/15ML solution  Commonly known as:  CHRONULAC  Take 30 g by mouth 2 (two) times daily.     lidocaine 5 %  Commonly known as:  LIDODERM  Place 1 patch onto the skin daily. Remove after 12 hours     lisinopril 5 MG tablet  Commonly known as:  PRINIVIL,ZESTRIL  Take 1 tablet (5 mg total) by mouth daily.  lovastatin 40 MG tablet  Commonly known as:  MEVACOR  Take 1 tablet (40 mg total) by mouth daily with supper.     metoprolol 50 MG tablet  Commonly known as:  LOPRESSOR  Take 50 mg by mouth 2 (two) times daily.     montelukast 10 MG tablet  Commonly known as:  SINGULAIR  Take 10 mg by mouth every morning.     nitroGLYCERIN 0.4 MG SL tablet  Commonly known as:  NITROSTAT  Place 1 tablet (0.4 mg total) under the tongue every 5 (five) minutes x 3 doses as needed. If no relief after 3rd dose, proceed to ED     nystatin-triamcinolone cream  Commonly known as:  MYCOLOG II  Apply 1 application topically daily as needed (for rash/inflammation).     pantoprazole 40 MG tablet  Commonly known as:  PROTONIX  Take 1 tablet (40 mg total) by mouth daily.     PROAIR HFA 108 (90 BASE) MCG/ACT inhaler  Generic drug:  albuterol  Inhale 2 puffs into the lungs every 4 (four) hours as needed for shortness of breath.     RECLAST 5 MG/100ML Soln injection  Generic drug:  zoledronic acid  Inject 5 mg into the vein once.     rOPINIRole 0.5 MG tablet  Commonly known as:  REQUIP  Take 1 mg by mouth at bedtime. Takes 2 tablets     SAVELLA 100 MG Tabs tablet  Generic drug:  Milnacipran HCl  Take 100 mg by mouth 2 (two) times daily.     SPIRIVA  HANDIHALER 18 MCG inhalation capsule  Generic drug:  tiotropium  Place 1 capsule into inhaler and inhale daily.     traMADol 50 MG tablet  Commonly known as:  ULTRAM  Take 2 tablets by mouth 2 (two) times daily.     Vitamin D 2000 UNITS Caps  Take 2,000 Units by mouth daily.       Duration of Discharge Encounter: Greater than 30 minutes including physician time.  Signed, Rick Duff, PA-C 01/05/2013, 9:48 AM

## 2013-01-10 ENCOUNTER — Telehealth: Payer: Self-pay | Admitting: Cardiology

## 2013-01-10 ENCOUNTER — Encounter: Payer: Self-pay | Admitting: Cardiology

## 2013-01-10 NOTE — Telephone Encounter (Signed)
LM informing pt that monitor results would be mailed to her.

## 2013-01-11 ENCOUNTER — Ambulatory Visit (INDEPENDENT_AMBULATORY_CARE_PROVIDER_SITE_OTHER): Payer: Medicare Other | Admitting: Cardiology

## 2013-01-11 ENCOUNTER — Encounter: Payer: Self-pay | Admitting: Cardiology

## 2013-01-11 VITALS — BP 150/90 | HR 67 | Ht 60.0 in | Wt 184.0 lb

## 2013-01-11 DIAGNOSIS — R55 Syncope and collapse: Secondary | ICD-10-CM

## 2013-01-11 DIAGNOSIS — I2589 Other forms of chronic ischemic heart disease: Secondary | ICD-10-CM

## 2013-01-11 DIAGNOSIS — I255 Ischemic cardiomyopathy: Secondary | ICD-10-CM

## 2013-01-11 MED ORDER — NICOTINE 14 MG/24HR TD PT24: MEDICATED_PATCH | TRANSDERMAL | Status: DC

## 2013-01-11 MED ORDER — NICOTINE 7 MG/24HR TD PT24: MEDICATED_PATCH | TRANSDERMAL | Status: DC

## 2013-01-11 MED ORDER — NICOTINE 21 MG/24HR TD PT24: MEDICATED_PATCH | TRANSDERMAL | Status: DC

## 2013-01-11 MED ORDER — METOPROLOL SUCCINATE ER 50 MG PO TB24: 50.0000 mg | ORAL_TABLET | Freq: Every day | ORAL | Status: DC

## 2013-01-11 NOTE — Patient Instructions (Signed)
Your physician recommends that you schedule a follow-up appointment in: 1 month with Dr. Wyline Mood. This appointment will be scheduled today before you leave.  Your physician has recommended you make the following change in your medication:  Start Nicotine patches as directed: 21 MG patch 1 patch everyday for 6 weeks 14 MG patch 1 patch everyday for 2 weeks 7 MG patch 1 patch everyday for 2 weeks  Stop Lopressor Start Toprol XL 50 MG once daily.  Continue all other medications the same.

## 2013-01-11 NOTE — Progress Notes (Signed)
Clinical Summary Ms. Muhlestein is a 62 y.o.female seen today for hospital follow up, for her detailed outpatient clinic note refer to my note dated 12/13/12   1. Syncope -Recent hospital admit 01/02/13 with syncope and NSTEMI, initially presented to [place], [place]. Per notes husband found her down, unclear if seizure related. Patient does not remember events. Trop peaked at 2.09. Cath showed no significant obstructive disease, LVEF 25-30% by LV gram and 25-30% by echo during admission. Patient seen by EP for possible syncope in setting of ICM, recommendations for lifevest and optimization of medical therapy with outpatient follow up in EP clinic with Dr Johney Frame. Instructed not to drive for 6 months.  - no recent symptoms since discharge.  - she is compliant with her lifevest    Past Medical History  Diagnosis Date  . GERD (gastroesophageal reflux disease)   . Hypertension   . HLD (hyperlipidemia)   . CAD (coronary artery disease)     a. 5/08: s/p DES to PDA and DES to LAD; b. 1/09: s/p DES to pLAD; c. 1/11: s/p DES to LAD x 2, d. LHC (3/12 in [place] - EF 55%, mild ant HK, nl LM, LAD stents ok, oLAD 30, RCA stents ok;  e.  4/13:  s/p DES to RCA, f. 1/14: dLM 20-30, mOM3 20-30, mLAD 80 ISR => s/p 2.5x16 mm Promus Element DES; EF 45-50%   . LBBB (left bundle Demyan Fugate block)   . History of depression   . COPD (chronic obstructive pulmonary disease)   . Ischemic cardiomyopathy     a. Echo (2/12):  EF 40%, anteroseptal HK  . Fibromyalgia   . Heart murmur      Allergies  Allergen Reactions  . Tape Rash and Other (See Comments)    Severe Reaction (latex tape): Burned hand, Redness-took days to clear up (03/02/12)  . Codeine Nausea And Vomiting  . Prozac [Fluoxetine Hcl] Other (See Comments)    Mean/violent thoughts  . Fosamax [Alendronate Sodium] Nausea Only and Other (See Comments)    Stomach pain  . Keflex [Cephalexin] Nausea Only  . Lipitor [Atorvastatin] Other (See  Comments)    Leg pain     Current Outpatient Prescriptions  Medication Sig Dispense Refill  . albuterol (PROAIR HFA) 108 (90 BASE) MCG/ACT inhaler Inhale 2 puffs into the lungs every 4 (four) hours as needed for shortness of breath.       Marland Kitchen aspirin 81 MG chewable tablet Chew 81 mg by mouth daily with breakfast.      . Calcium Carbonate-Vit D-Min (CALCIUM 1200) 1200-1000 MG-UNIT CHEW Chew 1 each by mouth daily.      . cetirizine (ZYRTEC) 10 MG tablet Take 10 mg by mouth daily.      . Cholecalciferol (VITAMIN D) 2000 UNITS CAPS Take 2,000 Units by mouth daily.       . clopidogrel (PLAVIX) 75 MG tablet Take 75 mg by mouth daily.       Marland Kitchen escitalopram (LEXAPRO) 20 MG tablet Take 20 mg by mouth daily.       Marland Kitchen HYDROcodone-acetaminophen (VICODIN) 5-500 MG per tablet Take 1 tablet by mouth every 4 (four) hours as needed for pain.      . INVESTIGATIONAL DRUG SIMPLE RECORD Take 7.5 mg by mouth 2 (two) times daily.      Marland Kitchen lactulose (CHRONULAC) 10 GM/15ML solution Take 30 g by mouth 2 (two) times daily.       Marland Kitchen lidocaine (LIDODERM) 5 % Place 1  patch onto the skin daily. Remove after 12 hours      . lisinopril (PRINIVIL,ZESTRIL) 5 MG tablet Take 1 tablet (5 mg total) by mouth daily.  30 tablet  4  . lovastatin (MEVACOR) 40 MG tablet Take 1 tablet (40 mg total) by mouth daily with supper.      . metoprolol (LOPRESSOR) 50 MG tablet Take 50 mg by mouth 2 (two) times daily.       . montelukast (SINGULAIR) 10 MG tablet Take 10 mg by mouth every morning.       . nitroGLYCERIN (NITROSTAT) 0.4 MG SL tablet Place 1 tablet (0.4 mg total) under the tongue every 5 (five) minutes x 3 doses as needed. If no relief after 3rd dose, proceed to ED  25 tablet  11  . nystatin-triamcinolone (MYCOLOG II) cream Apply 1 application topically daily as needed (for rash/inflammation).       . pantoprazole (PROTONIX) 40 MG tablet Take 1 tablet (40 mg total) by mouth daily.  30 tablet  4  . rOPINIRole (REQUIP) 0.5 MG tablet Take 1 mg  by mouth at bedtime. Takes 2 tablets      . SAVELLA 100 MG TABS tablet Take 100 mg by mouth 2 (two) times daily.       Marland Kitchen SPIRIVA HANDIHALER 18 MCG inhalation capsule Place 1 capsule into inhaler and inhale daily.      . traMADol (ULTRAM) 50 MG tablet Take 2 tablets by mouth 2 (two) times daily.       . zoledronic acid (RECLAST) 5 MG/100ML SOLN injection Inject 5 mg into the vein once.        No current facility-administered medications for this visit.     Past Surgical History  Procedure Laterality Date  . Angioplasty      stent 2011, 2010, 2009  . Orif ankle fracture  [id number]  . Tubal ligation  1989  . Coronary stent placement  02/2012     Allergies  Allergen Reactions  . Tape Rash and Other (See Comments)    Severe Reaction (latex tape): Burned hand, Redness-took days to clear up (03/02/12)  . Codeine Nausea And Vomiting  . Prozac [Fluoxetine Hcl] Other (See Comments)    Mean/violent thoughts  . Fosamax [Alendronate Sodium] Nausea Only and Other (See Comments)    Stomach pain  . Keflex [Cephalexin] Nausea Only  . Lipitor [Atorvastatin] Other (See Comments)    Leg pain      Family History  Problem Relation Age of Onset  . Breast cancer Mother   . Heart failure Brother   . Breast cancer Sister   . Breast cancer Sister   . Uterine cancer Sister      Social History Ms. Duddy reports that she has been smoking Cigarettes.  She has been smoking about 0.50 packs per day. She does not have any smokeless tobacco history on file. Ms. Lupu reports that she does not drink alcohol.   Review of Systems CONSTITUTIONAL: No weight loss, fever, chills, weakness or fatigue.  HEENT: Eyes: No visual loss, blurred vision, double vision or yellow sclerae.No hearing loss, sneezing, congestion, runny nose or sore throat.  SKIN: No rash or itching.  CARDIOVASCULAR: per HPI RESPIRATORY: No shortness of breath, cough or sputum.  GASTROINTESTINAL: No anorexia, nausea, vomiting or  diarrhea. No abdominal pain or blood.  GENITOURINARY: No burning on urination, no polyuria NEUROLOGICAL: per HPI  MUSCULOSKELETAL: No muscle, back pain, joint pain or stiffness.  LYMPHATICS: No enlarged nodes. No  history of splenectomy.  PSYCHIATRIC: No history of depression or anxiety.  ENDOCRINOLOGIC: No reports of sweating, cold or heat intolerance. No polyuria or polydipsia.  Marland Kitchen   Physical Examination p 61 bp 154/86 Wt 184 lbs BMI 36 Orthostatics Laying p 61 bp 138/102 Sitting p 62 bp 132/88 Standing p 67 bp 150/90 Gen: resting comfortably, no acute distress HEENT: no scleral icterus, pupils equal round and reactive, no palptable cervical adenopathy,  CV: RRR, no m/r/g, no JVD, no carotid bruits Resp: Clear to auscultation bilaterally GI: abdomen is soft, non-tender, non-distended, normal bowel sounds, no hepatosplenomegaly MSK: extremities are warm, no edema.  Skin: warm, no rash Neuro:  no focal deficits Psych: appropriate affect   Diagnostic Studies 04/2010 Cath [place], [place]: LVEF 55%, mild anterior hypokinesis. LM normal, LAD with stents in prox and mid portion widely patent, LAD ostial 30%. Diags with luminal irregs. LCX with luminial irregs. RCA with distal patent stents   Stent cards  May 2008 DES to PDA, DES to LAD  Jan 2009 DES to prox LAD  Mar 05 2009 DES to LAD x2  05/2011 DES to RCA  Jan 2014 stent done [place], [place]   03/2010 Echo: LVEF 40%, hypokinesis of the anteroseptum.   01/02/13 Cath Hemodynamic Findings:  Central aortic pressure: 147/79  Left ventricular pressure: 130/22/26  Angiographic Findings:  Left main: 30% distal stenosis. It appears that the distal left main has a stent that continues into the LAD.  Left Anterior Descending Artery: Large vessel that courses to the apex. The entire proximal and mid LAD is stented. There is mild stent restenosis in the proximal segment. The mid stented segment has diffuse 30% stent restenosis. The distal  vessel becomes small in caliber and has mild diffuse plaque. There is a moderate caliber first diagonal Keylah Darwish with mild plaque disease.  Circumflex Artery: Moderate caliber non-dominant vessel with three moderate caliber obtuse marginal branches. No obstructive disease.  Right Coronary Artery: Large dominant vessel with 30% proximal stenosis, heavily calcified mid vessel with patent stent in the mid vessel (no significant restenosis). The distal vessel has diffuse 40-50% stenosis. The posterolateral Donyel Nester and PDA are moderate caliber, patent vessels with mild plaque disease.  Left Ventricular Angiogram: LVEF=25-30%. Hypokinesis of the antero-apical wall.  Impression:  1. 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 Elevated troponin following syncopal event. No culprit lesions seen.  3. Moderate to severe LV systolic dysfunction.  Recommendations: Continue medical management of CAD. Workup for syncope is underway. Carotid dopplers today. Will check d-dimer as PE is a possibility. She could also have had an arrythmia with LV dysfunction.  Complications: None. The patient tolerated the procedure well.   01/03/13 Echo - LVEF 25-30%, akinesis of the anteroseptal and apical myocardium, grade I diastolic dysfunction,   01/03/13 Carotid US - The vertebral arteries appear patent with antegrade flow. - Findings consistent with1- 39 percent stenosis,high end of scale,involving the right internal carotid artery. - Findings consistent with 40 - 59 percent stenosis, low end of scale,involving the left internal carotid artery. - ICA/CCA ratio. right =1.72. left = 1.52 Other specific details can be found in the table(s) above. Prepared and Electronically Authenticated by  12/13/12 Event monitor No symptoms reported, sinus rhythm with conduction delay, occasional PVCs.     Assessment and Plan  1. Syncope - remains unclear etiology, concerning for possible malignant arrhythmia given  history of systolic dysfunction - cath showed no significant coronary disease during recent admission -  continue lifevest as we titrate her CHF medications, she will follow up with EP as an outpatient to be considered for an ICD   2. ICM - significant LV systolic dysfunction noted on recent echo during admission, LVEF 25-30%. No significant obstructive CAD on cath, patent stents.  - will change lopressor to Toprol XL in the setting of systolic dysfunction, she complains of chronic orthostatic dizziness so titration may be limited. She was not found to be orthostatic by vitals today in clinic - continue current lisinopril dose. - titrate medications as tolerated, once optimized she will need a repeat echo to evaluate for possible need of AICD.       Antoine Poche, M.D., F.A.C.C.

## 2013-02-14 ENCOUNTER — Telehealth: Payer: Self-pay | Admitting: Internal Medicine

## 2013-02-14 ENCOUNTER — Ambulatory Visit: Payer: BC Managed Care – PPO | Admitting: Cardiology

## 2013-02-14 NOTE — Telephone Encounter (Signed)
Forwarded to Rolinda Roan RN

## 2013-02-14 NOTE — Telephone Encounter (Signed)
Provider info.   Provider has 10 days to submit additional clinical information for appeal or reconsideration.

## 2013-02-14 NOTE — Telephone Encounter (Signed)
°  New problem      Jeanet Miller Rn w/[employer].called to inform this office  DME  Denied not medically nessisary Ref # [id number]  Fax # [phone].   Cpt code [id number].  If any questions please give them a call.

## 2013-02-15 NOTE — Telephone Encounter (Signed)
lmom for Becky [name] to return my call

## 2013-02-28 NOTE — Telephone Encounter (Signed)
All information faxed into [employer]

## 2013-03-01 ENCOUNTER — Encounter: Payer: Self-pay | Admitting: Cardiology

## 2013-03-01 ENCOUNTER — Ambulatory Visit (INDEPENDENT_AMBULATORY_CARE_PROVIDER_SITE_OTHER): Payer: BC Managed Care – PPO | Admitting: Cardiology

## 2013-03-01 VITALS — BP 139/83 | HR 76 | Ht 60.0 in | Wt 176.0 lb

## 2013-03-01 DIAGNOSIS — I2589 Other forms of chronic ischemic heart disease: Secondary | ICD-10-CM

## 2013-03-01 DIAGNOSIS — I255 Ischemic cardiomyopathy: Secondary | ICD-10-CM

## 2013-03-01 DIAGNOSIS — R55 Syncope and collapse: Secondary | ICD-10-CM

## 2013-03-01 NOTE — Patient Instructions (Signed)
Your physician recommends that you schedule a follow-up appointment in: 1 month with Dr. Wyline Mood. You will schedule this before you leave today.  Your physician recommends that you continue on your current medications as directed. Please refer to the Current Medication list given to you today.

## 2013-03-01 NOTE — Progress Notes (Signed)
Clinical Summary Ms. Becky [name] is a 63 y.o.female seen today for follow up of the following medical problems.   1. ICM - history of multiple PCI as described below, mainly in [place].  -Recent hospital admit 01/02/13 with syncope and NSTEMI, [place] to [place]. Per notes husband found her down, unclear if seizure related. Patient does not remember events. Trop peaked at 2.09. Cath showed no significant obstructive disease with patent stents, LVEF 25-30% by LV gram and 25-30% by echo during admission.   - last visit changed her short acting lopressor to Toprol XL, notes mildly increased palpitations since making this change. Does report moderate symptoms of lightheadness/dizziness that occurs with standing or walking, feels slowly improving.     2. Syncope Patient seen by EP for possible syncope in setting of ICM during recent admission, recommendations for lifevest and optimization of medical therapy with outpatient follow up in EP clinic with Dr Johney FrameAllred. Instructed not to drive for 6 months.  - she is compliant with her lifevest   Past Medical History  Diagnosis Date  . GERD (gastroesophageal reflux disease)   . Hypertension   . HLD (hyperlipidemia)   . CAD (coronary artery disease)     a. 5/08: s/p DES to PDA and DES to LAD; b. 1/09: s/p DES to pLAD; c. 1/11: s/p DES to LAD x 2, d. LHC (3/12 in [place] - EF 55%, mild ant HK, nl LM, LAD stents ok, oLAD 30, RCA stents ok;  e.  4/13:  s/p DES to RCA, f. 1/14: dLM 20-30, mOM3 20-30, mLAD 80 ISR => s/p 2.5x16 mm Promus Element DES; EF 45-50%   . LBBB (left bundle branch block)   . History of depression   . COPD (chronic obstructive pulmonary disease)   . Ischemic cardiomyopathy     a. Echo (2/12):  EF 40%, anteroseptal HK  . Fibromyalgia   . Heart murmur      Allergies  Allergen Reactions  . Tape Rash and Other (See Comments)    Severe Reaction (latex tape): Burned hand, Redness-took days to  clear up (03/02/12)  . Codeine Nausea And Vomiting  . Prozac [Fluoxetine Hcl] Other (See Comments)    Mean/violent thoughts  . Fosamax [Alendronate Sodium] Nausea Only and Other (See Comments)    Stomach pain  . Keflex [Cephalexin] Nausea Only  . Lipitor [Atorvastatin] Other (See Comments)    Leg pain     Current Outpatient Prescriptions  Medication Sig Dispense Refill  . albuterol (PROAIR HFA) 108 (90 BASE) MCG/ACT inhaler Inhale 2 puffs into the lungs every 4 (four) hours as needed for shortness of breath.       Marland Kitchen. aspirin 81 MG chewable tablet Chew 81 mg by mouth daily with breakfast.      . Calcium Carbonate-Vit D-Min (CALCIUM 1200) 1200-1000 MG-UNIT CHEW Chew 1 each by mouth daily.      . cetirizine (ZYRTEC) 10 MG tablet Take 10 mg by mouth daily.      . Cholecalciferol (VITAMIN D) 2000 UNITS CAPS Take 2,000 Units by mouth daily.       . clopidogrel (PLAVIX) 75 MG tablet Take 75 mg by mouth daily.       Marland Kitchen. escitalopram (LEXAPRO) 20 MG tablet Take 20 mg by mouth daily.       Marland Kitchen. HYDROcodone-acetaminophen (VICODIN) 5-500 MG per tablet Take 1 tablet by mouth every 4 (four) hours as needed for pain.      . INVESTIGATIONAL  DRUG SIMPLE RECORD Take 7.5 mg by mouth 2 (two) times daily.      Marland Kitchen lactulose (CHRONULAC) 10 GM/15ML solution Take 30 g by mouth 2 (two) times daily.       Marland Kitchen lidocaine (LIDODERM) 5 % Place 1 patch onto the skin daily. Remove after 12 hours      . lisinopril (PRINIVIL,ZESTRIL) 5 MG tablet Take 1 tablet (5 mg total) by mouth daily.  30 tablet  4  . lovastatin (MEVACOR) 40 MG tablet Take 1 tablet (40 mg total) by mouth daily with supper.      . metoprolol succinate (TOPROL-XL) 50 MG 24 hr tablet Take 1 tablet (50 mg total) by mouth daily. Take with or immediately following a meal.  30 tablet  6  . montelukast (SINGULAIR) 10 MG tablet Take 10 mg by mouth every morning.       . nicotine (NICODERM CQ - DOSED IN MG/24 HOURS) 14 mg/24hr patch 1 patch transdermal every day for 2  weeks  14 patch  0  . nicotine (NICODERM CQ - DOSED IN MG/24 HOURS) 21 mg/24hr patch 1 patch transdermal every day for 6 weeks.  42 patch  0  . nicotine (NICODERM CQ - DOSED IN MG/24 HR) 7 mg/24hr patch Place 7 MG patch transdermal every day for 2 weeks.  14 patch  0  . nitroGLYCERIN (NITROSTAT) 0.4 MG SL tablet Place 1 tablet (0.4 mg total) under the tongue every 5 (five) minutes x 3 doses as needed. If no relief after 3rd dose, proceed to ED  25 tablet  11  . nystatin-triamcinolone (MYCOLOG II) cream Apply 1 application topically daily as needed (for rash/inflammation).       . pantoprazole (PROTONIX) 40 MG tablet Take 1 tablet (40 mg total) by mouth daily.  30 tablet  4  . rOPINIRole (REQUIP) 0.5 MG tablet Take 1 mg by mouth at bedtime. Takes 2 tablets      . SAVELLA 100 MG TABS tablet Take 100 mg by mouth 2 (two) times daily.       Marland Kitchen SPIRIVA HANDIHALER 18 MCG inhalation capsule Place 1 capsule into inhaler and inhale daily.      . traMADol (ULTRAM) 50 MG tablet Take 2 tablets by mouth 2 (two) times daily.       . zoledronic acid (RECLAST) 5 MG/100ML SOLN injection Inject 5 mg into the vein once.        No current facility-administered medications for this visit.     Past Surgical History  Procedure Laterality Date  . Angioplasty      stent 2011, 2010, 2009  . Orif ankle fracture  [id number]  . Tubal ligation  1989  . Coronary stent placement  02/2012     Allergies  Allergen Reactions  . Tape Rash and Other (See Comments)    Severe Reaction (latex tape): Burned hand, Redness-took days to clear up (03/02/12)  . Codeine Nausea And Vomiting  . Prozac [Fluoxetine Hcl] Other (See Comments)    Mean/violent thoughts  . Fosamax [Alendronate Sodium] Nausea Only and Other (See Comments)    Stomach pain  . Keflex [Cephalexin] Nausea Only  . Lipitor [Atorvastatin] Other (See Comments)    Leg pain      Family History  Problem Relation Age of Onset  . Breast cancer Mother   . Heart  failure Brother   . Breast cancer Sister   . Breast cancer Sister   . Uterine cancer Sister  Social History Ms. Christo reports that she has been smoking Cigarettes.  She has been smoking about 0.50 packs per day. She does not have any smokeless tobacco history on file. Ms. Vallejo reports that she does not drink alcohol.   Review of Systems CONSTITUTIONAL: No weight loss, fever, chills, weakness or fatigue.  HEENT: Eyes: No visual loss, blurred vision, double vision or yellow sclerae.No hearing loss, sneezing, congestion, runny nose or sore throat.  SKIN: No rash or itching.  CARDIOVASCULAR: per HPI RESPIRATORY: No shortness of breath, cough or sputum.  GASTROINTESTINAL: No anorexia, nausea, vomiting or diarrhea. No abdominal pain or blood.  GENITOURINARY: No burning on urination, no polyuria NEUROLOGICAL: dizziness MUSCULOSKELETAL: No muscle, back pain, joint pain or stiffness.  LYMPHATICS: No enlarged nodes. No history of splenectomy.  PSYCHIATRIC: No history of depression or anxiety.  ENDOCRINOLOGIC: No reports of sweating, cold or heat intolerance. No polyuria or polydipsia.  Marland Kitchen   Physical Examination p 76 bp 139/83 p 76 Wt 176 lbs BMI 34 Gen: resting comfortably, no acute distress HEENT: no scleral icterus, pupils equal round and reactive, no palptable cervical adenopathy,  CV: RRR, no m/r/g, no JVD,no carotid bruits Resp: Clear to auscultation bilaterally GI: abdomen is soft, non-tender, non-distended, normal bowel sounds, no hepatosplenomegaly MSK: extremities are warm, no edema.  Skin: warm, no rash Neuro:  no focal deficits Psych: appropriate affect   Diagnostic Studies 04/2010 Cath [place], [place]: LVEF 55%, mild anterior hypokinesis. LM normal, LAD with stents in prox and mid portion widely patent, LAD ostial 30%. Diags with luminal irregs. LCX with luminial irregs. RCA with distal patent stents   Stent cards  May 2008 DES to PDA, DES to LAD  Jan 2009 DES  to prox LAD  Mar 05 2009 DES to LAD x2  05/2011 DES to RCA  Jan 2014 stent done [place], [place]   03/2010 Echo: LVEF 40%, hypokinesis of the anteroseptum.   01/02/13 Cath  Hemodynamic Findings:  Central aortic pressure: 147/79  Left ventricular pressure: 130/22/26  Angiographic Findings:  Left main: 30% distal stenosis. It appears that the distal left main has a stent that continues into the LAD.  Left Anterior Descending Artery: Large vessel that courses to the apex. The entire proximal and mid LAD is stented. There is mild stent restenosis in the proximal segment. The mid stented segment has diffuse 30% stent restenosis. The distal vessel becomes small in caliber and has mild diffuse plaque. There is a moderate caliber first diagonal branch with mild plaque disease.  Circumflex Artery: Moderate caliber non-dominant vessel with three moderate caliber obtuse marginal branches. No obstructive disease.  Right Coronary Artery: Large dominant vessel with 30% proximal stenosis, heavily calcified mid vessel with patent stent in the mid vessel (no significant restenosis). The distal vessel has diffuse 40-50% stenosis. The posterolateral branch and PDA are moderate caliber, patent vessels with mild plaque disease.  Left Ventricular Angiogram: LVEF=25-30%. Hypokinesis of the antero-apical wall.  Impression:  1. 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 Elevated troponin following syncopal event. No culprit lesions seen.  3. Moderate to severe LV systolic dysfunction.  Recommendations: Continue medical management of CAD. Workup for syncope is underway. Carotid dopplers today. Will check d-dimer as PE is a possibility. She could also have had an arrythmia with LV dysfunction.  Complications: None. The patient tolerated the procedure well.   01/03/13 Echo  - LVEF 25-30%, akinesis of the anteroseptal and apical myocardium, grade I diastolic dysfunction,  01/03/13 Carotid US  - The  vertebral arteries appear patent with antegrade flow. - Findings consistent with1- 39 percent stenosis,high end of scale,involving the right internal carotid artery. - Findings consistent with 40 - 59 percent stenosis, low end of scale,involving the left internal carotid artery. - ICA/CCA ratio. right =1.72. left = 1.52 Other specific details can be found in the table(s) above. Prepared and Electronically Authenticated by  12/13/12 Event monitor  No symptoms reported, sinus rhythm with conduction delay, occasional PVCs.      Assessment and Plan  1. ICM  - significant LV systolic dysfunction noted on recent echo during admission, LVEF 25-30%. No significant obstructive CAD on cath, patent stents. She is NYHA II, she is euvolemic in clinic today - describes moderate orthostatic symptoms that are tolerable for the time being, will continue current doses without further titration at this time.  - she may be at her ceiling as far as medications go, reevaluate in 1 month, decide at that time about repeating echo to evaluate for possible need of AICD.  2. Syncope  - remains unclear etiology, concerning for possible malignant arrhythmia given history of systolic dysfunction  - cath showed no significant coronary disease during recent admission  - continue lifevest as we titrate her CHF medications, she will follow up with EP as an outpatient to be considered for an ICD       Antoine Poche, M.D., F.A.C.C.

## 2013-03-03 ENCOUNTER — Telehealth: Payer: Self-pay | Admitting: *Deleted

## 2013-03-03 NOTE — Telephone Encounter (Signed)
Patient called office stating that her device went off last night.  Started blinking red, but did not deliver a shock.  States she feels tired today.  Patient questions if she needs to do anything about this.  Explained to patient that I will have to defer this question to device tech.  Patient will await return call for advice.

## 2013-03-03 NOTE — Telephone Encounter (Signed)
Becky [name] spoke with pt in regards to light blinking.

## 2013-03-18 ENCOUNTER — Institutional Professional Consult (permissible substitution): Payer: BC Managed Care – PPO | Admitting: Internal Medicine

## 2013-03-25 ENCOUNTER — Encounter (HOSPITAL_COMMUNITY): Payer: Self-pay | Admitting: *Deleted

## 2013-04-06 ENCOUNTER — Telehealth: Payer: Self-pay | Admitting: Internal Medicine

## 2013-04-06 NOTE — Telephone Encounter (Signed)
New message     Did we get the renewal for the life vest for this patient?

## 2013-04-06 NOTE — Telephone Encounter (Signed)
Dr Johney Frame has signed  I will send in

## 2013-04-11 ENCOUNTER — Ambulatory Visit: Payer: BC Managed Care – PPO | Admitting: Cardiology

## 2013-04-22 ENCOUNTER — Ambulatory Visit (INDEPENDENT_AMBULATORY_CARE_PROVIDER_SITE_OTHER): Payer: PRIVATE HEALTH INSURANCE | Admitting: Cardiology

## 2013-04-22 ENCOUNTER — Encounter: Payer: Self-pay | Admitting: Cardiology

## 2013-04-22 VITALS — BP 152/85 | HR 71 | Ht 60.0 in | Wt 181.0 lb

## 2013-04-22 DIAGNOSIS — I255 Ischemic cardiomyopathy: Secondary | ICD-10-CM

## 2013-04-22 DIAGNOSIS — I2589 Other forms of chronic ischemic heart disease: Secondary | ICD-10-CM

## 2013-04-22 MED ORDER — METOPROLOL SUCCINATE ER 100 MG PO TB24: 100.0000 mg | ORAL_TABLET | Freq: Every day | ORAL | Status: DC

## 2013-04-22 MED ORDER — NICOTINE 21 MG/24HR TD PT24: 21.0000 mg | MEDICATED_PATCH | Freq: Every day | TRANSDERMAL | Status: DC

## 2013-04-22 MED ORDER — NICOTINE 7 MG/24HR TD PT24: 7.0000 mg | MEDICATED_PATCH | Freq: Every day | TRANSDERMAL | Status: DC

## 2013-04-22 MED ORDER — NICOTINE 14 MG/24HR TD PT24: 14.0000 mg | MEDICATED_PATCH | Freq: Every day | TRANSDERMAL | Status: DC

## 2013-04-22 NOTE — Progress Notes (Signed)
Clinical Summary Ms. Bachmann is a 63 y.o.female seen today for follow up of the following medical problems.   1. ICM  - history of multiple PCI as described below, mainly in [place].  -Recent hospital admit 01/02/13 with syncope and NSTEMI, initially presented to [place], [place]. Per notes husband found her down, unclear if seizure related. Patient does not remember events. Trop peaked at 2.09. Cath showed no significant obstructive disease with patent stents, LVEF 25-30% by LV gram and 25-30% by echo during admission.   - her lopressor was changed to Toprol XL 12/2012 in the setting of systolic dysfunction, we have been titrating the dose over follow up appointments. Titration has been slowed by orthostatic symptoms at times that have improved. Last visit increased to 50mg  daily which she is tolerating.   - since last visit describes some allergies, congestion. No chest pain. Occas swelling in feet   2. Syncope  Patient seen by EP for possible syncope in setting of ICM during recent admission, recommendations for lifevest and optimization of medical therapy with outpatient follow up in EP clinic with Dr Johney Frame. Instructed not to drive for 6 months.  - she is compliant with her lifevest - no recent symptoms  Past Medical History  Diagnosis Date  . GERD (gastroesophageal reflux disease)   . Hypertension   . HLD (hyperlipidemia)   . CAD (coronary artery disease)     a. 5/08: s/p DES to PDA and DES to LAD; b. 1/09: s/p DES to pLAD; c. 1/11: s/p DES to LAD x 2, d. LHC (3/12 in [place] - EF 55%, mild ant HK, nl LM, LAD stents ok, oLAD 30, RCA stents ok;  e.  4/13:  s/p DES to RCA, f. 1/14: dLM 20-30, mOM3 20-30, mLAD 80 ISR => s/p 2.5x16 mm Promus Element DES; EF 45-50%   . LBBB (left bundle Noelene Gang block)   . History of depression   . COPD (chronic obstructive pulmonary disease)   . Ischemic cardiomyopathy     a. Echo (2/12):  EF 40%, anteroseptal HK  . Fibromyalgia   .  Heart murmur      Allergies  Allergen Reactions  . Tape Rash and Other (See Comments)    Severe Reaction (latex tape): Burned hand, Redness-took days to clear up (03/02/12)  . Codeine Nausea And Vomiting  . Prozac [Fluoxetine Hcl] Other (See Comments)    Mean/violent thoughts  . Fosamax [Alendronate Sodium] Nausea Only and Other (See Comments)    Stomach pain  . Keflex [Cephalexin] Nausea Only  . Lipitor [Atorvastatin] Other (See Comments)    Leg pain     Current Outpatient Prescriptions  Medication Sig Dispense Refill  . albuterol (PROAIR HFA) 108 (90 BASE) MCG/ACT inhaler Inhale 2 puffs into the lungs every 4 (four) hours as needed for shortness of breath.       Marland Kitchen aspirin 81 MG chewable tablet Chew 81 mg by mouth daily with breakfast.      . Calcium Carbonate-Vit D-Min (CALCIUM 1200) 1200-1000 MG-UNIT CHEW Chew 1 each by mouth daily.      . cetirizine (ZYRTEC) 10 MG tablet Take 10 mg by mouth daily.      . Cholecalciferol (VITAMIN D) 2000 UNITS CAPS Take 2,000 Units by mouth daily.       . clopidogrel (PLAVIX) 75 MG tablet Take 75 mg by mouth daily.       Marland Kitchen escitalopram (LEXAPRO) 20 MG tablet Take 20 mg by mouth daily.       Marland Kitchen  HYDROcodone-acetaminophen (VICODIN) 5-500 MG per tablet Take 1 tablet by mouth every 4 (four) hours as needed for pain.      . INVESTIGATIONAL DRUG SIMPLE RECORD Take 7.5 mg by mouth 2 (two) times daily.      Marland Kitchen lactulose (CHRONULAC) 10 GM/15ML solution Take 30 g by mouth 2 (two) times daily.       Marland Kitchen lidocaine (LIDODERM) 5 % Place 1 patch onto the skin daily. Remove after 12 hours      . lisinopril (PRINIVIL,ZESTRIL) 5 MG tablet Take 1 tablet (5 mg total) by mouth daily.  30 tablet  4  . lovastatin (MEVACOR) 40 MG tablet Take 1 tablet (40 mg total) by mouth daily with supper.      . metoprolol succinate (TOPROL-XL) 50 MG 24 hr tablet Take 1 tablet (50 mg total) by mouth daily. Take with or immediately following a meal.  30 tablet  6  . montelukast (SINGULAIR)  10 MG tablet Take 10 mg by mouth every morning.       . nicotine (NICODERM CQ - DOSED IN MG/24 HOURS) 14 mg/24hr patch 1 patch transdermal every day for 2 weeks  14 patch  0  . nicotine (NICODERM CQ - DOSED IN MG/24 HOURS) 21 mg/24hr patch 1 patch transdermal every day for 6 weeks.  42 patch  0  . nicotine (NICODERM CQ - DOSED IN MG/24 HR) 7 mg/24hr patch Place 7 MG patch transdermal every day for 2 weeks.  14 patch  0  . nitroGLYCERIN (NITROSTAT) 0.4 MG SL tablet Place 1 tablet (0.4 mg total) under the tongue every 5 (five) minutes x 3 doses as needed. If no relief after 3rd dose, proceed to ED  25 tablet  11  . nystatin-triamcinolone (MYCOLOG II) cream Apply 1 application topically daily as needed (for rash/inflammation).       . pantoprazole (PROTONIX) 40 MG tablet Take 1 tablet (40 mg total) by mouth daily.  30 tablet  4  . rOPINIRole (REQUIP) 0.5 MG tablet Take 1 mg by mouth at bedtime. Takes 2 tablets      . SAVELLA 100 MG TABS tablet Take 100 mg by mouth 2 (two) times daily.       Marland Kitchen SPIRIVA HANDIHALER 18 MCG inhalation capsule Place 1 capsule into inhaler and inhale daily.      . traMADol (ULTRAM) 50 MG tablet Take 2 tablets by mouth 2 (two) times daily.       . zoledronic acid (RECLAST) 5 MG/100ML SOLN injection Inject 5 mg into the vein once.        No current facility-administered medications for this visit.     Past Surgical History  Procedure Laterality Date  . Angioplasty      stent 2011, 2010, 2009  . Orif ankle fracture  [id number]  . Tubal ligation  1989  . Coronary stent placement  02/2012     Allergies  Allergen Reactions  . Tape Rash and Other (See Comments)    Severe Reaction (latex tape): Burned hand, Redness-took days to clear up (03/02/12)  . Codeine Nausea And Vomiting  . Prozac [Fluoxetine Hcl] Other (See Comments)    Mean/violent thoughts  . Fosamax [Alendronate Sodium] Nausea Only and Other (See Comments)    Stomach pain  . Keflex [Cephalexin] Nausea Only    . Lipitor [Atorvastatin] Other (See Comments)    Leg pain      Family History  Problem Relation Age of Onset  . Breast cancer Mother   .  Heart failure Brother   . Breast cancer Sister   . Breast cancer Sister   . Uterine cancer Sister      Social History Ms. Humphrey RollsMarlowe reports that she has been smoking Cigarettes.  She started smoking about 22 years ago. She has a 10.5 pack-year smoking history. She has never used smokeless tobacco. Ms. Humphrey RollsMarlowe reports that she does not drink alcohol.   Review of Systems CONSTITUTIONAL: No weight loss, fever, chills, weakness or fatigue.  HEENT: Eyes: No visual loss, blurred vision, double vision or yellow sclerae.No hearing loss, sneezing, congestion, runny nose or sore throat.  SKIN: No rash or itching.  CARDIOVASCULAR: per HPI RESPIRATORY: No shortness of breath, cough or sputum.  GASTROINTESTINAL: No anorexia, nausea, vomiting or diarrhea. No abdominal pain or blood.  GENITOURINARY: No burning on urination, no polyuria NEUROLOGICAL: No headache, dizziness, syncope, paralysis, ataxia, numbness or tingling in the extremities. No change in bowel or bladder control.  MUSCULOSKELETAL: No muscle, back pain, joint pain or stiffness.  LYMPHATICS: No enlarged nodes. No history of splenectomy.  PSYCHIATRIC: No history of depression or anxiety.  ENDOCRINOLOGIC: No reports of sweating, cold or heat intolerance. No polyuria or polydipsia.  Marland Kitchen.   Physical Examination p 71 bp 150/80 Wt 181 lbs BMI 35 Gen: resting comfortably, no acute distress HEENT: no scleral icterus, pupils equal round and reactive, no palptable cervical adenopathy,  CV: RRR, no m/r/g, no JVD, no carotid bruits Resp: Clear to auscultation bilaterally GI: abdomen is soft, non-tender, non-distended, normal bowel sounds, no hepatosplenomegaly MSK: extremities are warm, no edema.  Skin: warm, no rash Neuro:  no focal deficits Psych: appropriate affect   Diagnostic Studies 04/2010  [place]: LVEF 55%, mild anterior hypokinesis. LM normal, LAD with stents in prox and mid portion widely patent, LAD ostial 30%. Diags with luminal irregs. LCX with luminial irregs. RCA with distal patent stents   Stent cards  May 2008 DES to PDA, DES to LAD  Jan 2009 DES to prox LAD  Mar 05 2009 DES to LAD x2  05/2011 DES to RCA  Jan 2014 [place]   03/2010 Echo: LVEF 40%, hypokinesis of the anteroseptum.   01/02/13 Cath  Hemodynamic Findings:  Central aortic pressure: 147/79  Left ventricular pressure: 130/22/26  Angiographic Findings:  Left main: 30% distal stenosis. It appears that the distal left main has a stent that continues into the LAD.  Left Anterior Descending Artery: Large vessel that courses to the apex. The entire proximal and mid LAD is stented. There is mild stent restenosis in the proximal segment. The mid stented segment has diffuse 30% stent restenosis. The distal vessel becomes small in caliber and has mild diffuse plaque. There is a moderate caliber first diagonal Marshawn Ninneman with mild plaque disease.  Circumflex Artery: Moderate caliber non-dominant vessel with three moderate caliber obtuse marginal branches. No obstructive disease.  Right Coronary Artery: Large dominant vessel with 30% proximal stenosis, heavily calcified mid vessel with patent stent in the mid vessel (no significant restenosis). The distal vessel has diffuse 40-50% stenosis. The posterolateral Danira Nylander and PDA are moderate caliber, patent vessels with mild plaque disease.  Left Ventricular Angiogram: LVEF=25-30%. Hypokinesis of the antero-apical wall.  Impression:  1. 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 Elevated troponin following syncopal event. No culprit lesions seen.  3. Moderate to severe LV systolic dysfunction.  Recommendations: Continue medical management of CAD. Workup for syncope is underway. Carotid dopplers today. Will check  d-dimer as PE is  a possibility. She could also have had an arrythmia with LV dysfunction.  Complications: None. The patient tolerated the procedure well.   01/03/13 Echo  - LVEF 25-30%, akinesis of the anteroseptal and apical myocardium, grade I diastolic dysfunction,   01/03/13 Carotid US  - The vertebral arteries appear patent with antegrade flow. - Findings consistent with1- 39 percent stenosis,high end of scale,involving the right internal carotid artery. - Findings consistent with 40 - 59 percent stenosis, low end of scale,involving the left internal carotid artery. - ICA/CCA ratio. right =1.72. left = 1.52 Other specific details can be found in the table(s) above. Prepared and Electronically Authenticated by  12/13/12 Event monitor  No symptoms reported, sinus rhythm with conduction delay, occasional PVCs.      Assessment and Plan   1. ICM  - significant LV systolic dysfunction noted on recent echo during admission, LVEF 25-30%. No significant obstructive CAD on cath, patent stents. She is NYHA II, she is euvolemic in clinic today  - will increase Toprol XL to 100mg  daily, titration has been slowed by initial orthostatic symptoms that have improved.    2. Syncope  - remains unclear etiology, concerning for possible malignant arrhythmia given history of systolic dysfunction  - cath showed no significant coronary disease during recent admission  - continue lifevest as we titrate her CHF medications, she will follow up with EP as an outpatient to be considered for an ICD  - she was switched to Toprol XL 12/2012 with slow titration due to initial symptoms, will defer timing of repeat echo and timing of possible ICD to EP.      Antoine Poche, M.D., F.A.C.C.

## 2013-04-22 NOTE — Patient Instructions (Signed)
Your physician recommends that you schedule a follow-up appointment in: 1 month. Your physician has recommended you make the following change in your medication: Increase your metoprolol succinate to 100 mg daily. You may take (2) of your 50 mg tablets daily until they are finished. Your new prescription has been sent to your pharmacy. All other medications will remain the same. Start nicotine 21 mg patches for 6 weeks, then 14 mg nicotine patches for 2 weeks, then 7 mg nicotine patches for 2 weeks, then stop. These prescriptions have been sent to your pharmacy.

## 2013-04-27 ENCOUNTER — Institutional Professional Consult (permissible substitution): Payer: BC Managed Care – PPO | Admitting: Internal Medicine

## 2013-04-27 ENCOUNTER — Encounter: Payer: Self-pay | Admitting: Internal Medicine

## 2013-04-27 ENCOUNTER — Ambulatory Visit (INDEPENDENT_AMBULATORY_CARE_PROVIDER_SITE_OTHER): Payer: PRIVATE HEALTH INSURANCE | Admitting: Internal Medicine

## 2013-04-27 VITALS — BP 154/83 | HR 56 | Ht 60.0 in | Wt 180.1 lb

## 2013-04-27 DIAGNOSIS — I255 Ischemic cardiomyopathy: Secondary | ICD-10-CM | POA: Insufficient documentation

## 2013-04-27 DIAGNOSIS — I10 Essential (primary) hypertension: Secondary | ICD-10-CM

## 2013-04-27 DIAGNOSIS — I519 Heart disease, unspecified: Secondary | ICD-10-CM

## 2013-04-27 DIAGNOSIS — I2510 Atherosclerotic heart disease of native coronary artery without angina pectoris: Secondary | ICD-10-CM

## 2013-04-27 DIAGNOSIS — R55 Syncope and collapse: Secondary | ICD-10-CM

## 2013-04-27 DIAGNOSIS — I2589 Other forms of chronic ischemic heart disease: Secondary | ICD-10-CM

## 2013-04-27 DIAGNOSIS — Z01810 Encounter for preprocedural cardiovascular examination: Secondary | ICD-10-CM

## 2013-04-27 NOTE — Patient Instructions (Signed)
Your physician has requested that you have an echocardiogram. Echocardiography is a painless test that uses sound waves to create images of your heart. It provides your doctor with information about the size and shape of your heart and how well your heart's chambers and valves are working. This procedure takes approximately one hour. There are no restrictions for this procedure. Office will contact with results via phone or letter.   Labs for BMET, CBC, PT, PTT  Bi-V ICD - will be scheduled & patient called  Follow up will be given at time of discharge

## 2013-04-27 NOTE — Progress Notes (Signed)
PCP:  Bedelia Person, MD Primary Cardiologist:  Dr Wyline Mood  The patient presents today for electrophysiology followup. She presented 11/14 with syncope of unclear etiology.  She was found to have MI but no lesions amenable to intervention by cath.  Plan was for LifeVest for 40 days and then repeat echo and proceed with BiV ICD.  She has not yet had her echo.  She has been compliant with medical therapy and is still wearing her lifevest.  She reports daytime fatigue and decreased exercise tolerance.  She has SOB with moderate activity. Today, she denies symptoms of palpitations, chest pain,  orthopnea, PND, lower extremity edema, dizziness, presyncope, further syncope, or neurologic sequela.  The patient feels that she is tolerating medications without difficulties and is otherwise without complaint today.   Past Medical History  Diagnosis Date  . GERD (gastroesophageal reflux disease)   . Hypertension   . HLD (hyperlipidemia)   . CAD (coronary artery disease)     a. 5/08: s/p DES to PDA and DES to LAD; b. 1/09: s/p DES to pLAD; c. 1/11: s/p DES to LAD x 2, d. LHC (3/12 in [place] - EF 55%, mild ant HK, nl LM, LAD stents ok, oLAD 30, RCA stents ok;  e.  4/13:  s/p DES to RCA, f. 1/14: dLM 20-30, mOM3 20-30, mLAD 80 ISR => s/p 2.5x16 mm Promus Element DES; EF 45-50%   . LBBB (left bundle branch block)   . History of depression   . COPD (chronic obstructive pulmonary disease)   . Ischemic cardiomyopathy     a. Echo (11/14):  EF 25%   . Fibromyalgia   . Syncope 11/14    LifeVest placed   Past Surgical History  Procedure Laterality Date  . Angioplasty      stent 2011, 2010, 2009  . Orif ankle fracture  [id number]  . Tubal ligation  1989  . Coronary stent placement  02/2012    Current Outpatient Prescriptions  Medication Sig Dispense Refill  . albuterol (PROAIR HFA) 108 (90 BASE) MCG/ACT inhaler Inhale 2 puffs into the lungs every 4 (four) hours as needed for shortness of breath.         Marland Kitchen aspirin 81 MG chewable tablet Chew 81 mg by mouth daily with breakfast.      . Calcium Carbonate-Vit D-Min (CALCIUM 1200) 1200-1000 MG-UNIT CHEW Chew 1 each by mouth daily.      . cetirizine (ZYRTEC) 10 MG tablet Take 10 mg by mouth daily.      . Cholecalciferol (VITAMIN D) 2000 UNITS CAPS Take 2,000 Units by mouth daily.       . clopidogrel (PLAVIX) 75 MG tablet Take 75 mg by mouth daily.       Marland Kitchen escitalopram (LEXAPRO) 20 MG tablet Take 20 mg by mouth daily.       Marland Kitchen guaiFENesin (MUCINEX) 600 MG 12 hr tablet Take 600 mg by mouth 2 (two) times daily as needed.      Marland Kitchen HYDROcodone-acetaminophen (NORCO/VICODIN) 5-325 MG per tablet Take 1 tablet by mouth every 4 (four) hours as needed.      . lactulose (CHRONULAC) 10 GM/15ML solution Take 10-20 g by mouth 2 (two) times daily as needed.       . lidocaine (LIDODERM) 5 % Place 1 patch onto the skin daily. Remove after 12 hours      . lisinopril (PRINIVIL,ZESTRIL) 5 MG tablet Take 1 tablet (5 mg total) by mouth daily.  30 tablet  4  .  lovastatin (MEVACOR) 40 MG tablet Take 1 tablet (40 mg total) by mouth daily with supper.      . metoprolol succinate (TOPROL-XL) 100 MG 24 hr tablet Take 1 tablet (100 mg total) by mouth daily. Take with or immediately following a meal.  90 tablet  3  . montelukast (SINGULAIR) 10 MG tablet Take 10 mg by mouth every morning.       . nicotine (NICODERM CQ - DOSED IN MG/24 HOURS) 21 mg/24hr patch Place 1 patch (21 mg total) onto the skin daily.  14 patch  2  . nitroGLYCERIN (NITROSTAT) 0.4 MG SL tablet Place 1 tablet (0.4 mg total) under the tongue every 5 (five) minutes x 3 doses as needed. If no relief after 3rd dose, proceed to ED  25 tablet  11  . pantoprazole (PROTONIX) 40 MG tablet Take 1 tablet (40 mg total) by mouth daily.  30 tablet  4  . rOPINIRole (REQUIP) 0.5 MG tablet Take 1-2 mg by mouth at bedtime.       Marland Kitchen. SALINE NASAL SPRAY NA Place 1-2 sprays into the nose as directed.      Marland Kitchen. SPIRIVA HANDIHALER 18 MCG  inhalation capsule Place 1 capsule into inhaler and inhale daily.      . traMADol (ULTRAM) 50 MG tablet Take 2 tablets by mouth 2 (two) times daily.       . zoledronic acid (RECLAST) 5 MG/100ML SOLN injection Inject 5 mg into the vein once.       . nicotine (NICODERM CQ - DOSED IN MG/24 HOURS) 14 mg/24hr patch Place 1 patch (14 mg total) onto the skin daily.  14 patch  0  . nicotine (NICODERM CQ - DOSED IN MG/24 HR) 7 mg/24hr patch Place 1 patch (7 mg total) onto the skin daily.  14 patch  0   No current facility-administered medications for this visit.    Allergies  Allergen Reactions  . Tape Rash and Other (See Comments)    Severe Reaction (latex tape): Burned hand, Redness-took days to clear up (03/02/12)  . Codeine Nausea And Vomiting  . Prozac [Fluoxetine Hcl] Other (See Comments)    Mean/violent thoughts  . Fosamax [Alendronate Sodium] Nausea Only and Other (See Comments)    Stomach pain  . Keflex [Cephalexin] Nausea Only  . Lipitor [Atorvastatin] Other (See Comments)    Leg pain    History   Social History  . Marital Status: Married    Spouse Name: N/A    Number of Children: N/A  . Years of Education: N/A   Occupational History  . Not on file.   Social History Main Topics  . Smoking status: Current Every Day Smoker -- 0.50 packs/day for 21 years    Types: Cigarettes    Start date: 03/09/1991  . Smokeless tobacco: Never Used  . Alcohol Use: No  . Drug Use: Not on file  . Sexual Activity: Not on file   Other Topics Concern  . Not on file   Social History Narrative   [place] in [place]    Family History  Problem Relation Age of Onset  . Breast cancer Mother   . Heart failure Brother   . Breast cancer Sister   . Breast cancer Sister   . Uterine cancer Sister     ROS-  All systems are reviewed and are negative except as outlined in the HPI above  Physical Exam: Filed Vitals:   04/27/13 0819  BP: 154/83  Pulse: 56  Height: 5' (1.524 m)  Weight: 180 lb  1.9 oz (81.702 kg)  SpO2: 96%    GEN- The patient is pleasant appearing, alert and oriented x 3 today.   Head- normocephalic, atraumatic Eyes-  Sclera clear, conjunctiva pink Ears- hearing intact Oropharynx- clear Neck- supple, no JVP Lymph- no cervical lymphadenopathy Lungs- Clear to ausculation bilaterally, normal work of breathing Heart- Regular rate and rhythm, no murmurs, rubs or gallops, PMI not laterally displaced GI- soft, NT, ND, + BS Extremities- no clubbing, cyanosis, or edema MS- no significant deformity or atrophy Skin- no rash or lesion Psych- euthymic mood, full affect Neuro- strength and sensation are intact  ekg 11/14 reveals sinus rhythm with LBBB (QRS > 150 msec) 11/14 echo is reviewed Epic notes from hospitalization as well as Dr Verna Czech follow-up notes are reviewed  Assessment and Plan:  The patient has an ischemic CM (EF 25%), NYHA Class III CHF, and CAD.  She also has LBBB with prior syncope.  As per the previous plan, we will repeat an echo at this time.  I would anticipate that she would benefit from CRT-D device.  She has been treated with an optimal regimen but continues to have CHF symptoms.  Risks, benefits, alternatives to ICD implantation were discussed in detail with the patient today. The patient  understands that the risks include but are not limited to bleeding, infection, pneumothorax, perforation, tamponade, vascular damage, renal failure, MI, stroke, death, inappropriate shocks, and lead dislodgement and wishes to proceed.  We will therefore schedule device implantation after her echo has been obtained.  She will continue to wear her lifevest in the interim.

## 2013-04-28 ENCOUNTER — Other Ambulatory Visit (INDEPENDENT_AMBULATORY_CARE_PROVIDER_SITE_OTHER): Payer: PRIVATE HEALTH INSURANCE

## 2013-04-28 ENCOUNTER — Other Ambulatory Visit: Payer: Self-pay

## 2013-04-28 DIAGNOSIS — I519 Heart disease, unspecified: Secondary | ICD-10-CM

## 2013-04-28 DIAGNOSIS — I255 Ischemic cardiomyopathy: Secondary | ICD-10-CM

## 2013-04-28 DIAGNOSIS — R55 Syncope and collapse: Secondary | ICD-10-CM

## 2013-04-28 DIAGNOSIS — I2589 Other forms of chronic ischemic heart disease: Secondary | ICD-10-CM

## 2013-04-28 DIAGNOSIS — I2510 Atherosclerotic heart disease of native coronary artery without angina pectoris: Secondary | ICD-10-CM

## 2013-05-05 ENCOUNTER — Encounter: Payer: Self-pay | Admitting: *Deleted

## 2013-05-05 ENCOUNTER — Other Ambulatory Visit: Payer: Self-pay | Admitting: *Deleted

## 2013-05-05 DIAGNOSIS — I509 Heart failure, unspecified: Secondary | ICD-10-CM

## 2013-05-05 DIAGNOSIS — I255 Ischemic cardiomyopathy: Secondary | ICD-10-CM

## 2013-05-24 ENCOUNTER — Encounter: Payer: Self-pay | Admitting: Cardiology

## 2013-05-24 ENCOUNTER — Ambulatory Visit (INDEPENDENT_AMBULATORY_CARE_PROVIDER_SITE_OTHER): Payer: PRIVATE HEALTH INSURANCE | Admitting: Cardiology

## 2013-05-24 VITALS — BP 121/80 | HR 60 | Ht 60.0 in | Wt 177.0 lb

## 2013-05-24 DIAGNOSIS — I5022 Chronic systolic (congestive) heart failure: Secondary | ICD-10-CM

## 2013-05-24 DIAGNOSIS — I10 Essential (primary) hypertension: Secondary | ICD-10-CM

## 2013-05-24 DIAGNOSIS — R404 Transient alteration of awareness: Secondary | ICD-10-CM

## 2013-05-24 DIAGNOSIS — I25810 Atherosclerosis of coronary artery bypass graft(s) without angina pectoris: Secondary | ICD-10-CM

## 2013-05-24 DIAGNOSIS — R400 Somnolence: Secondary | ICD-10-CM

## 2013-05-24 DIAGNOSIS — I2510 Atherosclerotic heart disease of native coronary artery without angina pectoris: Secondary | ICD-10-CM

## 2013-05-24 MED ORDER — SPIRONOLACTONE 12.5 MG HALF TABLET: 12.5000 mg | ORAL_TABLET | Freq: Every day | ORAL | Status: DC

## 2013-05-24 NOTE — Patient Instructions (Addendum)
Your physician recommends that you schedule a follow-up appointment in: 3 months with Dr. Wyline Mood. You should receive a letter in the mail in 1-2 months. If you do not receive this letter by May or June 2015 call our office to schedule this appointment.   Your physician has recommended you make the following change in your medication:  Start: Spironolactone (Aldactone) 12.5 MG take 1 tablet by mouth once daily  Continue all other medications the same.   Your physician recommends that you return for lab work in: 3 weeks for BEMT. Around 06-14-2013  You may go to one of the following locations to have lab work completed: [facility] [address] Dr. Lysbeth Galas office in [facility].   You have been referred to Dr. Andrey Campanile

## 2013-05-24 NOTE — Progress Notes (Signed)
Clinical Summary Ms. Humphrey RollsMarlowe is a 63 y.o.female seen today for follow up of the following medical problems.   1. ICM  - history of multiple PCIs as described below, [place].  -Recent hospital admit 01/02/13 with syncope and NSTEMI, [place]. Per notes husband found her down, unclear if seizure related. Patient does not remember events. Trop peaked at 2.09. Cath showed no significant obstructive disease with patent stents, LVEF 25-30% by LV gram and 25-30% by echo during admission.  - her lopressor was changed to Toprol XL 12/2012 in the setting of systolic dysfunction, we have been titrating the dose over follow up appointments. Titration has been slowed by orthostatic symptoms   - echo repated 04/28/13 LVEF remains 30-35% despite course of medical therapy. Plans per EP for BiV AICD on 06/02/13.    - since last visit describes some SOB she associated more with allergies.Chronic 3 pillow orthopnea unchanged. Notes some occasional swelling, on lasix 20mg  daily which has helped  2. Syncope  Patient seen by EP for possible syncope in setting of ICM during recent admission, recommendations for lifevest and optimization of medical therapy with outpatient follow up in EP clinic with Dr Johney FrameAllred. Instructed not to drive for 6 months.  - she is compliant with her lifevest  - no recent symptoms  3. ? Sleep apnea - + daytime somnolence, + snoring, + apneic episodes   Past Medical History  Diagnosis Date  . GERD (gastroesophageal reflux disease)   . Hypertension   . HLD (hyperlipidemia)   . CAD (coronary artery disease)     a. 5/08: s/p DES to PDA and DES to LAD; b. 1/09: s/p DES to pLAD; c. 1/11: s/p DES to LAD x 2, d. LHC (3/12 in [place] - EF 55%, mild ant HK, nl LM, LAD stents ok, oLAD 30, RCA stents ok;  e.  4/13:  s/p DES to RCA, f. 1/14: dLM 20-30, mOM3 20-30, mLAD 80 ISR => s/p 2.5x16 mm Promus Element DES; EF 45-50%   . LBBB (left  bundle branch block)   . History of depression   . COPD (chronic obstructive pulmonary disease)   . Ischemic cardiomyopathy     a. Echo (11/14):  EF 25%   . Fibromyalgia   . Syncope 11/14    LifeVest placed     Allergies  Allergen Reactions  . Tape Rash and Other (See Comments)    Severe Reaction (latex tape): Burned hand, Redness-took days to clear up (03/02/12)  . Codeine Nausea And Vomiting  . Prozac [Fluoxetine Hcl] Other (See Comments)    Mean/violent thoughts  . Fosamax [Alendronate Sodium] Nausea Only and Other (See Comments)    Stomach pain  . Keflex [Cephalexin] Nausea Only  . Lipitor [Atorvastatin] Other (See Comments)    Leg pain     Current Outpatient Prescriptions  Medication Sig Dispense Refill  . albuterol (PROAIR HFA) 108 (90 BASE) MCG/ACT inhaler Inhale 2 puffs into the lungs every 4 (four) hours as needed for shortness of breath.       Marland Kitchen. aspirin 81 MG chewable tablet Chew 81 mg by mouth daily with breakfast.      . cetirizine (ZYRTEC) 10 MG tablet Take 10 mg by mouth daily.      . clopidogrel (PLAVIX) 75 MG tablet Take 75 mg by mouth daily.       Marland Kitchen. escitalopram (LEXAPRO) 20 MG tablet Take 20 mg by mouth daily.       .Marland Kitchen  furosemide (LASIX) 20 MG tablet Take 20 mg by mouth daily.      Marland Kitchen guaiFENesin (MUCINEX) 600 MG 12 hr tablet Take 600 mg by mouth 2 (two) times daily as needed.      Marland Kitchen HYDROcodone-acetaminophen (NORCO/VICODIN) 5-325 MG per tablet Take 1 tablet by mouth every 4 (four) hours as needed.      . lactulose (CHRONULAC) 10 GM/15ML solution Take 10-20 g by mouth 2 (two) times daily as needed.       . lidocaine (LIDODERM) 5 % Place 1 patch onto the skin daily. Remove after 12 hours      . lisinopril (PRINIVIL,ZESTRIL) 5 MG tablet Take 1 tablet (5 mg total) by mouth daily.  30 tablet  4  . lovastatin (MEVACOR) 40 MG tablet Take 1 tablet (40 mg total) by mouth daily with supper.      . metoprolol succinate (TOPROL-XL) 100 MG 24 hr tablet Take 1 tablet (100 mg  total) by mouth daily. Take with or immediately following a meal.  90 tablet  3  . montelukast (SINGULAIR) 10 MG tablet Take 10 mg by mouth every morning.       . nicotine (NICODERM CQ - DOSED IN MG/24 HOURS) 14 mg/24hr patch Place 1 patch (14 mg total) onto the skin daily.  14 patch  0  . nicotine (NICODERM CQ - DOSED IN MG/24 HOURS) 21 mg/24hr patch Place 1 patch (21 mg total) onto the skin daily.  14 patch  2  . nicotine (NICODERM CQ - DOSED IN MG/24 HR) 7 mg/24hr patch Place 1 patch (7 mg total) onto the skin daily.  14 patch  0  . nitroGLYCERIN (NITROSTAT) 0.4 MG SL tablet Place 1 tablet (0.4 mg total) under the tongue every 5 (five) minutes x 3 doses as needed. If no relief after 3rd dose, proceed to ED  25 tablet  11  . pantoprazole (PROTONIX) 40 MG tablet Take 1 tablet (40 mg total) by mouth daily.  30 tablet  4  . rOPINIRole (REQUIP) 0.5 MG tablet Take 1-2 mg by mouth at bedtime.       Marland Kitchen SALINE NASAL SPRAY NA Place 1-2 sprays into the nose as directed.      Marland Kitchen SPIRIVA HANDIHALER 18 MCG inhalation capsule Place 1 capsule into inhaler and inhale daily.      . traMADol (ULTRAM) 50 MG tablet Take 2 tablets by mouth 2 (two) times daily.       . zoledronic acid (RECLAST) 5 MG/100ML SOLN injection Inject 5 mg into the vein once.        No current facility-administered medications for this visit.     Past Surgical History  Procedure Laterality Date  . Angioplasty      stent 2011, 2010, 2009  . Orif ankle fracture  [id number]  . Tubal ligation  1989  . Coronary stent placement  02/2012     Allergies  Allergen Reactions  . Tape Rash and Other (See Comments)    Severe Reaction (latex tape): Burned hand, Redness-took days to clear up (03/02/12)  . Codeine Nausea And Vomiting  . Prozac [Fluoxetine Hcl] Other (See Comments)    Mean/violent thoughts  . Fosamax [Alendronate Sodium] Nausea Only and Other (See Comments)    Stomach pain  . Keflex [Cephalexin] Nausea Only  . Lipitor  [Atorvastatin] Other (See Comments)    Leg pain      Family History  Problem Relation Age of Onset  . Breast cancer Mother   .  Heart failure Brother   . Breast cancer Sister   . Breast cancer Sister   . Uterine cancer Sister      Social History Ms. Janelle reports that she has been smoking Cigarettes.  She started smoking about 22 years ago. She has a 10.5 pack-year smoking history. She has never used smokeless tobacco. Ms. Ceasar reports that she does not drink alcohol.   Review of Systems CONSTITUTIONAL: No weight loss, fever, chills, weakness or fatigue.  HEENT: Eyes: No visual loss, blurred vision, double vision or yellow sclerae.No hearing loss, sneezing, congestion, runny nose or sore throat.  SKIN: No rash or itching.  CARDIOVASCULAR: per HPI RESPIRATORY: per HPI  GASTROINTESTINAL: No anorexia, nausea, vomiting or diarrhea. No abdominal pain or blood.  GENITOURINARY: No burning on urination, no polyuria NEUROLOGICAL: No headache, dizziness, syncope, paralysis, ataxia, numbness or tingling in the extremities. No change in bowel or bladder control.  MUSCULOSKELETAL: No muscle, back pain, joint pain or stiffness.  LYMPHATICS: No enlarged nodes. No history of splenectomy.  PSYCHIATRIC: No history of depression or anxiety.  ENDOCRINOLOGIC: No reports of sweating, cold or heat intolerance. No polyuria or polydipsia.  Marland Kitchen   Physical Examination Filed Vitals:   05/24/13 1053  BP: 121/80  Pulse: 60   Filed Weights   05/24/13 1053  Weight: 177 lb (80.287 kg)    Gen: resting comfortably, no acute distress HEENT: no scleral icterus, pupils equal round and reactive, no palptable cervical adenopathy,  CV: RRR, no m/r/g, no JVD, no carotid bruits Resp: Clear to auscultation bilaterally GI: abdomen is soft, non-tender, non-distended, normal bowel sounds, no hepatosplenomegaly MSK: extremities are warm, no edema.  Skin: warm, no rash Neuro:  no focal deficits Psych:  appropriate affect   Diagnostic Studies  04/2010 Cath [place], [place]: LVEF 55%, mild anterior hypokinesis. LM normal, LAD with stents in prox and mid portion widely patent, LAD ostial 30%. Diags with luminal irregs. LCX with luminial irregs. RCA with distal patent stents   Stent cards  May 2008 DES to PDA, DES to LAD  Jan 2009 DES to prox LAD  Mar 05 2009 DES to LAD x2  05/2011 DES to RCA  Jan 2014 stent done [place], [place]  03/2010 Echo: LVEF 40%, hypokinesis of the anteroseptum.   01/02/13 Cath  Hemodynamic Findings:  Central aortic pressure: 147/79  Left ventricular pressure: 130/22/26  Angiographic Findings:  Left main: 30% distal stenosis. It appears that the distal left main has a stent that continues into the LAD.  Left Anterior Descending Artery: Large vessel that courses to the apex. The entire proximal and mid LAD is stented. There is mild stent restenosis in the proximal segment. The mid stented segment has diffuse 30% stent restenosis. The distal vessel becomes small in caliber and has mild diffuse plaque. There is a moderate caliber first diagonal branch with mild plaque disease.  Circumflex Artery: Moderate caliber non-dominant vessel with three moderate caliber obtuse marginal branches. No obstructive disease.  Right Coronary Artery: Large dominant vessel with 30% proximal stenosis, heavily calcified mid vessel with patent stent in the mid vessel (no significant restenosis). The distal vessel has diffuse 40-50% stenosis. The posterolateral branch and PDA are moderate caliber, patent vessels with mild plaque disease.  Left Ventricular Angiogram: LVEF=25-30%. Hypokinesis of the antero-apical wall.  Impression:  1. 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 Elevated troponin following syncopal event. No culprit lesions seen.  3. Moderate to severe LV systolic dysfunction.  Recommendations:  Continue medical management of CAD. Workup for syncope is underway.  Carotid dopplers today. Will check d-dimer as PE is a possibility. She could also have had an arrythmia with LV dysfunction.  Complications: None. The patient tolerated the procedure well.  01/03/13 Echo  - LVEF 25-30%, akinesis of the anteroseptal and apical myocardium, grade I diastolic dysfunction,   01/03/13 Carotid US  - The vertebral arteries appear patent with antegrade flow. - Findings consistent with1- 39 percent stenosis,high end of scale,involving the right internal carotid artery. - Findings consistent with 40 - 59 percent stenosis, low end of scale,involving the left internal carotid artery. - ICA/CCA ratio. right =1.72. left = 1.52 Other specific details can be found in the table(s) above. Prepared and Electronically Authenticated by  12/13/12 Event monitor  No symptoms reported, sinus rhythm with conduction delay, occasional PVCs.    04/2013 Echo LVEF 30-35%, multiple WMAs, grade I diastolic dysfunction    Assessment and Plan   1. ICM  - LVEF 30-35% by repeat echo 04/2013. No significant obstructive CAD on cath, patent stents. She is NYHA II-III, she is euvolemic in clinic today  - medical therapy has been limited by orthostatic symptoms - she has had a trial of medical therapy without improvement in her LVEF, she is for BiV AICD later this month - will start low dose spironolactone 12.5mg  daily today with BMET in 3 weeks  2. Syncope  - remains unclear etiology, concerning for possible malignant arrhythmia given history of systolic dysfunction  - cath showed no significant coronary disease during recent admission despite elevated troponin - has worn lifevest since that time, LVEF has not improved on trial of medical therapy. Plan for BiV AICD. Marland Kitchen  3. CAD - she has a very high stent burden with recent NSTEMI, on plavix with plans to continue indefintely.  4. ?sleep apnea - several risk factors for OSA, will refer for sleep study  Antoine Poche, M.D.,  F.A.C.C.

## 2013-05-27 ENCOUNTER — Encounter (HOSPITAL_COMMUNITY): Payer: Self-pay | Admitting: Pharmacy Technician

## 2013-06-01 MED ORDER — CHLORHEXIDINE GLUCONATE 4 % EX LIQD
60.0000 mL | Freq: Once | CUTANEOUS | Status: DC
  Filled 2013-06-01: qty 60

## 2013-06-01 MED ORDER — SODIUM CHLORIDE 0.9 % IR SOLN
80.0000 mg | Status: DC
  Filled 2013-06-01: qty 2

## 2013-06-01 MED ORDER — VANCOMYCIN HCL IN DEXTROSE 1-5 GM/200ML-% IV SOLN
1000.0000 mg | INTRAVENOUS | Status: DC
  Filled 2013-06-01: qty 200

## 2013-06-01 MED ORDER — SODIUM CHLORIDE 0.9 % IV SOLN
INTRAVENOUS | Status: DC
  Administered 2013-06-02: 12:00:00 via INTRAVENOUS

## 2013-06-02 ENCOUNTER — Encounter (HOSPITAL_COMMUNITY): Payer: Self-pay | Admitting: *Deleted

## 2013-06-02 ENCOUNTER — Encounter (HOSPITAL_COMMUNITY)
Admission: RE | Disposition: A | Discharge status: Home / Self Care | Payer: Medicaid - Out of State | Source: Ambulatory Visit | Attending: Internal Medicine

## 2013-06-02 ENCOUNTER — Ambulatory Visit (HOSPITAL_COMMUNITY)
Admission: RE | Admit: 2013-06-02 | Discharge: 2013-06-03 | Disposition: A | Discharge status: Home / Self Care | Payer: PRIVATE HEALTH INSURANCE | Source: Ambulatory Visit | Attending: Internal Medicine | Admitting: Internal Medicine

## 2013-06-02 DIAGNOSIS — F17200 Nicotine dependence, unspecified, uncomplicated: Secondary | ICD-10-CM | POA: Insufficient documentation

## 2013-06-02 DIAGNOSIS — I255 Ischemic cardiomyopathy: Secondary | ICD-10-CM

## 2013-06-02 DIAGNOSIS — IMO0001 Reserved for inherently not codable concepts without codable children: Secondary | ICD-10-CM | POA: Insufficient documentation

## 2013-06-02 DIAGNOSIS — I5022 Chronic systolic (congestive) heart failure: Secondary | ICD-10-CM

## 2013-06-02 DIAGNOSIS — I2589 Other forms of chronic ischemic heart disease: Secondary | ICD-10-CM | POA: Insufficient documentation

## 2013-06-02 DIAGNOSIS — J4489 Other specified chronic obstructive pulmonary disease: Secondary | ICD-10-CM | POA: Insufficient documentation

## 2013-06-02 DIAGNOSIS — I2510 Atherosclerotic heart disease of native coronary artery without angina pectoris: Secondary | ICD-10-CM | POA: Insufficient documentation

## 2013-06-02 DIAGNOSIS — Z7902 Long term (current) use of antithrombotics/antiplatelets: Secondary | ICD-10-CM | POA: Insufficient documentation

## 2013-06-02 DIAGNOSIS — Z7982 Long term (current) use of aspirin: Secondary | ICD-10-CM | POA: Insufficient documentation

## 2013-06-02 DIAGNOSIS — K219 Gastro-esophageal reflux disease without esophagitis: Secondary | ICD-10-CM | POA: Insufficient documentation

## 2013-06-02 DIAGNOSIS — R55 Syncope and collapse: Secondary | ICD-10-CM | POA: Diagnosis present

## 2013-06-02 DIAGNOSIS — I10 Essential (primary) hypertension: Secondary | ICD-10-CM | POA: Insufficient documentation

## 2013-06-02 DIAGNOSIS — J449 Chronic obstructive pulmonary disease, unspecified: Secondary | ICD-10-CM | POA: Insufficient documentation

## 2013-06-02 DIAGNOSIS — F3289 Other specified depressive episodes: Secondary | ICD-10-CM | POA: Insufficient documentation

## 2013-06-02 DIAGNOSIS — Z9861 Coronary angioplasty status: Secondary | ICD-10-CM | POA: Insufficient documentation

## 2013-06-02 DIAGNOSIS — I509 Heart failure, unspecified: Secondary | ICD-10-CM | POA: Insufficient documentation

## 2013-06-02 DIAGNOSIS — I447 Left bundle-branch block, unspecified: Secondary | ICD-10-CM | POA: Insufficient documentation

## 2013-06-02 DIAGNOSIS — F329 Major depressive disorder, single episode, unspecified: Secondary | ICD-10-CM | POA: Insufficient documentation

## 2013-06-02 DIAGNOSIS — I252 Old myocardial infarction: Secondary | ICD-10-CM | POA: Insufficient documentation

## 2013-06-02 DIAGNOSIS — I5042 Chronic combined systolic (congestive) and diastolic (congestive) heart failure: Secondary | ICD-10-CM | POA: Diagnosis present

## 2013-06-02 HISTORY — DX: Unspecified chronic bronchitis: J42

## 2013-06-02 HISTORY — DX: Major depressive disorder, single episode, unspecified: F32.9

## 2013-06-02 HISTORY — DX: Unspecified asthma, uncomplicated: J45.909

## 2013-06-02 HISTORY — DX: Depression, unspecified: F32.A

## 2013-06-02 HISTORY — DX: Headache, unspecified: R51.9

## 2013-06-02 HISTORY — DX: Acute myocardial infarction, unspecified: I21.9

## 2013-06-02 HISTORY — PX: BI-VENTRICULAR IMPLANTABLE CARDIOVERTER DEFIBRILLATOR  (CRT-D): SHX5747

## 2013-06-02 HISTORY — PX: BI-VENTRICULAR IMPLANTABLE CARDIOVERTER DEFIBRILLATOR: SHX5459

## 2013-06-02 HISTORY — DX: Low back pain, unspecified: M54.50

## 2013-06-02 HISTORY — DX: Heart failure, unspecified: I50.9

## 2013-06-02 HISTORY — DX: Other chronic pain: G89.29

## 2013-06-02 HISTORY — DX: Anxiety disorder, unspecified: F41.9

## 2013-06-02 HISTORY — DX: Unspecified osteoarthritis, unspecified site: M19.90

## 2013-06-02 HISTORY — DX: Low back pain: M54.5

## 2013-06-02 HISTORY — DX: Headache: R51

## 2013-06-02 HISTORY — DX: Pneumonia, unspecified organism: J18.9

## 2013-06-02 LAB — CBC
HEMATOCRIT: 44.1 % (ref 36.0–46.0)
HEMOGLOBIN: 15.2 g/dL — AB (ref 12.0–15.0)
MCH: 31.4 pg (ref 26.0–34.0)
MCHC: 34.5 g/dL (ref 30.0–36.0)
MCV: 91.1 fL (ref 78.0–100.0)
Platelets: 256 10*3/uL (ref 150–400)
RBC: 4.84 MIL/uL (ref 3.87–5.11)
RDW: 12.8 % (ref 11.5–15.5)
WBC: 8.1 10*3/uL (ref 4.0–10.5)

## 2013-06-02 LAB — BASIC METABOLIC PANEL
BUN: 13 mg/dL (ref 6–23)
CALCIUM: 10.8 mg/dL — AB (ref 8.4–10.5)
CO2: 25 mEq/L (ref 19–32)
Chloride: 101 mEq/L (ref 96–112)
Creatinine, Ser: 0.71 mg/dL (ref 0.50–1.10)
GFR calc Af Amer: >90 mL/min (ref >90)
GFR, EST NON AFRICAN AMERICAN: 90 mL/min — AB (ref >90)
GLUCOSE: 94 mg/dL (ref 70–99)
Potassium: 4.5 mEq/L (ref 3.7–5.3)
Sodium: 140 mEq/L (ref 137–147)

## 2013-06-02 LAB — SURGICAL PCR SCREEN
MRSA, PCR: NEGATIVE
Staphylococcus aureus: NEGATIVE

## 2013-06-02 SURGERY — BI-VENTRICULAR IMPLANTABLE CARDIOVERTER DEFIBRILLATOR  (CRT-D)
Anesthesia: LOCAL

## 2013-06-02 MED ORDER — MUPIROCIN 2 % EX OINT
TOPICAL_OINTMENT | Freq: Two times a day (BID) | CUTANEOUS | Status: DC
  Administered 2013-06-02: 1 via NASAL

## 2013-06-02 MED ORDER — PANTOPRAZOLE SODIUM 40 MG PO TBEC
40.0000 mg | DELAYED_RELEASE_TABLET | Freq: Every day | ORAL | Status: DC
  Administered 2013-06-02 – 2013-06-03 (×2): 40 mg via ORAL
  Filled 2013-06-02 (×2): qty 1

## 2013-06-02 MED ORDER — MONTELUKAST SODIUM 10 MG PO TABS
10.0000 mg | ORAL_TABLET | Freq: Every morning | ORAL | Status: DC
  Administered 2013-06-03: 10 mg via ORAL
  Filled 2013-06-02: qty 1

## 2013-06-02 MED ORDER — ROPINIROLE HCL 1 MG PO TABS
1.0000 mg | ORAL_TABLET | Freq: Every day | ORAL | Status: DC
  Administered 2013-06-02: 1 mg via ORAL
  Filled 2013-06-02 (×2): qty 1

## 2013-06-02 MED ORDER — HYDROCODONE-ACETAMINOPHEN 5-325 MG PO TABS
1.0000 | ORAL_TABLET | ORAL | Status: DC | PRN
  Administered 2013-06-02 – 2013-06-03 (×3): 1 via ORAL
  Filled 2013-06-02 (×3): qty 1

## 2013-06-02 MED ORDER — TIOTROPIUM BROMIDE MONOHYDRATE 18 MCG IN CAPS
1.0000 | ORAL_CAPSULE | Freq: Every day | RESPIRATORY_TRACT | Status: DC
  Administered 2013-06-03: 09:00:00 18 ug via RESPIRATORY_TRACT
  Filled 2013-06-02: qty 5

## 2013-06-02 MED ORDER — ESCITALOPRAM OXALATE 20 MG PO TABS
20.0000 mg | ORAL_TABLET | Freq: Every day | ORAL | Status: DC
  Administered 2013-06-02 – 2013-06-03 (×2): 20 mg via ORAL
  Filled 2013-06-02 (×2): qty 1

## 2013-06-02 MED ORDER — LIDOCAINE HCL (PF) 1 % IJ SOLN
INTRAMUSCULAR | Status: AC
  Filled 2013-06-02: qty 60

## 2013-06-02 MED ORDER — LISINOPRIL 5 MG PO TABS
5.0000 mg | ORAL_TABLET | Freq: Every day | ORAL | Status: DC
  Administered 2013-06-02 – 2013-06-03 (×2): 5 mg via ORAL
  Filled 2013-06-02 (×2): qty 1

## 2013-06-02 MED ORDER — MIDAZOLAM HCL 5 MG/5ML IJ SOLN
INTRAMUSCULAR | Status: AC
  Filled 2013-06-02: qty 5

## 2013-06-02 MED ORDER — SODIUM CHLORIDE 0.9 % IV SOLN: 250.0000 mL | INTRAVENOUS | Status: DC | PRN

## 2013-06-02 MED ORDER — METOPROLOL SUCCINATE ER 100 MG PO TB24
100.0000 mg | ORAL_TABLET | Freq: Every day | ORAL | Status: DC
  Administered 2013-06-03: 100 mg via ORAL
  Filled 2013-06-02: qty 1

## 2013-06-02 MED ORDER — ALBUTEROL SULFATE (2.5 MG/3ML) 0.083% IN NEBU: 2.5000 mg | INHALATION_SOLUTION | RESPIRATORY_TRACT | Status: DC | PRN

## 2013-06-02 MED ORDER — ACETAMINOPHEN 325 MG PO TABS: 325.0000 mg | ORAL_TABLET | ORAL | Status: DC | PRN

## 2013-06-02 MED ORDER — FUROSEMIDE 20 MG PO TABS
20.0000 mg | ORAL_TABLET | Freq: Every day | ORAL | Status: DC
  Administered 2013-06-03: 11:00:00 20 mg via ORAL
  Filled 2013-06-02: qty 1

## 2013-06-02 MED ORDER — ASPIRIN 81 MG PO CHEW
81.0000 mg | CHEWABLE_TABLET | Freq: Every day | ORAL | Status: DC
  Administered 2013-06-02 – 2013-06-03 (×2): 81 mg via ORAL
  Filled 2013-06-02 (×2): qty 1

## 2013-06-02 MED ORDER — FENTANYL CITRATE 0.05 MG/ML IJ SOLN
INTRAMUSCULAR | Status: AC
  Filled 2013-06-02: qty 2

## 2013-06-02 MED ORDER — TRAMADOL HCL 50 MG PO TABS
100.0000 mg | ORAL_TABLET | Freq: Two times a day (BID) | ORAL | Status: DC
  Administered 2013-06-02 – 2013-06-03 (×2): 100 mg via ORAL
  Filled 2013-06-02 (×2): qty 2

## 2013-06-02 MED ORDER — ONDANSETRON HCL 4 MG/2ML IJ SOLN: 4.0000 mg | Freq: Four times a day (QID) | INTRAMUSCULAR | Status: DC | PRN

## 2013-06-02 MED ORDER — SODIUM CHLORIDE 0.9 % IJ SOLN: 3.0000 mL | INTRAMUSCULAR | Status: DC | PRN

## 2013-06-02 MED ORDER — SPIRONOLACTONE 12.5 MG HALF TABLET
12.5000 mg | ORAL_TABLET | Freq: Every day | ORAL | Status: DC
  Administered 2013-06-02 – 2013-06-03 (×2): 12.5 mg via ORAL
  Filled 2013-06-02 (×2): qty 1

## 2013-06-02 MED ORDER — MUPIROCIN 2 % EX OINT
TOPICAL_OINTMENT | CUTANEOUS | Status: AC
  Filled 2013-06-02: qty 22

## 2013-06-02 MED ORDER — ALBUTEROL SULFATE HFA 108 (90 BASE) MCG/ACT IN AERS: 2.0000 | INHALATION_SPRAY | RESPIRATORY_TRACT | Status: DC | PRN

## 2013-06-02 MED ORDER — SODIUM CHLORIDE 0.9 % IJ SOLN
3.0000 mL | Freq: Two times a day (BID) | INTRAMUSCULAR | Status: DC
  Administered 2013-06-03: 3 mL via INTRAVENOUS

## 2013-06-02 MED ORDER — HEPARIN (PORCINE) IN NACL 2-0.9 UNIT/ML-% IJ SOLN
INTRAMUSCULAR | Status: AC
  Filled 2013-06-02: qty 500

## 2013-06-02 MED ORDER — CLOPIDOGREL BISULFATE 75 MG PO TABS
75.0000 mg | ORAL_TABLET | Freq: Every day | ORAL | Status: DC
  Administered 2013-06-02 – 2013-06-03 (×2): 75 mg via ORAL
  Filled 2013-06-02 (×2): qty 1

## 2013-06-02 MED ORDER — VANCOMYCIN HCL IN DEXTROSE 1-5 GM/200ML-% IV SOLN
1000.0000 mg | Freq: Two times a day (BID) | INTRAVENOUS | Status: AC
  Administered 2013-06-03: 03:00:00 1000 mg via INTRAVENOUS
  Filled 2013-06-02: qty 200

## 2013-06-02 NOTE — Op Note (Signed)
SURGEON: Hillis RangeJames Pattrick Bady, MD   PREPROCEDURE DIAGNOSES:  1. Ischemic cardiomyopathy.  2. New York Heart Association class III, heart failure chronically.  3. Left bundle-branch block.  4. Syncope  POSTPROCEDURE DIAGNOSES:  1. Ischemic cardiomyopathy.  2. New York Heart Association class III, heart failure chronically.  3. Left bundle-branch block.  4. Syncope   PROCEDURES:  1. Left upper extremity venography  2. Biventricular ICD implantation.   INTRODUCTION:  Becky SealsSharon K [name] is a 63 y.o. female with an ischemic CM (EF 30%), NYHA Class III CHF, and LBBB QRS morophology. At this time, she  meets MADIT II/ SCD-HeFT criteria for ICD implantation for primary prevention of sudden death. Given LBBB, the patient may also be expected to benefit from resynchronization therapy.  The patient has been treated with an optimal medical regimen but continues to have a depressed ejection fraction and NYHA Class III CHF symptoms. She has had prior syncope.  She therefore presents today for a biventricular ICD implantation.   DESCRIPTION OF PROCEDURE: Informed written consent was obtained and the patient was brought to the electrophysiology lab in the fasting state. The patient was adequately sedated with intravenous Versed, and fentanyl as outlined in the nursing report. The patient's left chest was prepped and draped in the usual sterile fashion by the EP lab staff. The skin overlying the left deltopectoral region was infiltrated with lidocaine for local analgesia. A 5-cm incision was made over the left deltopectoral region. A left subcutaneous defibrillator pocket was fashioned using a combination of sharp and blunt dissection. Electrocautery was used to assure hemostasis.   RA/RV Lead Placement:  A venogram revealed a large left axillary vein which emptied into a large subclavian vein.  The left axillary vein was cannulated with fluoroscopic visualization. Through the left axillary vein, a Medtronic model  [id number] (serial # [id number]) right atrial lead and a Medtronic model [id number] (serial number [id number]) right ventricular defibrillator lead were advanced with fluoroscopic visualization into the right atrial appendage and right ventricular apex positions respectively. Initial atrial lead P-waves measured 3  mV with an impedance of 839 ohms and a threshold of 1.0 volts at 0.5 milliseconds. The right ventricular lead R-wave measured 8.1 mV with impedance of 933 ohms and a threshold of 0.7 volts at 0.5 milliseconds.   LV Lead Placement:  A Medtronic MB-2 guide was advanced through the left axillary vein into the low lateral right atrium. A Bard curved Damato catheter was introduced through the MB-2 guide and used to cannulate the coronary sinus. Coronary sinus cannulation was confirmed with electrogram recording from the hexapolar catheter. A selective coronary sinus venogram was performed by hand injection of nonionic contrast. This demonstrated a large CS body with two small distal branches. There was a moderate sized proximal coronary sinus branch as well as a posterolateral branch.   A Whisper CSJ wire was introduced through the transseptal sheath and advanced into the distal lateral branch. A Medtronic model 4598 - 88 (serial number [id number] ) lead was advanced through the MB-2 into the lateral branch. This was approximately one-thirds from the base to the apex in a very lateral position. In this location, the left ventricular lead R-waves measured 5 mV with impedance of 690 ohms and a threshold of 0.5 volt at 0.5 milliseconds in the bipolar LV1-LV2 configuration with no diaphragmatic stimulation observed when pacing at 10 volts output. The MB-2 guide was therefore removed.  All three leads were secured to the pectoralis fascia using #2 silk suture over the  suture sleeves. The pocket then irrigated with copious gentamicin solution. The leads were then connected to a Medtronic model [id number] Ovidio Kin XT  CRT-D (serial Number [id number]) device. The defibrillator was placed into the pocket. The pocket was then closed in 2 layers with 2.0 Vicryl suture for the subcutaneous and subcuticular layers. Steri-Strips and a sterile dressing were then applied.  DFT testing was not performed today. The procedure was therefore considered completed. There were no early apparhent complications.   CONCLUSIONS:  1. Ischemic cardiomyopathy with Left bundle-branch block and chronic New York Heart Association class III heart failure.  2. Successful biventricular ICD implantation.  3. No early apparent complications.

## 2013-06-02 NOTE — H&P (Signed)
PCP: Bedelia PersonAARON,CAREN T, MD  Primary Cardiologist: Dr Wyline MoodBranch   The patient presents today for BIV ICD implant. She presented 11/14 with syncope of unclear etiology. She was found to have MI but no lesions amenable to intervention by cath.  She reports daytime fatigue and decreased exercise tolerance. She has SOB with moderate activity. Today, she denies symptoms of palpitations, chest pain, orthopnea, PND, lower extremity edema, dizziness, presyncope, further syncope, or neurologic sequela. The patient feels that she is tolerating  medications without difficulties and is otherwise without complaint today.  Past Medical History   Diagnosis  Date   .  GERD (gastroesophageal reflux disease)    .  Hypertension    .  HLD (hyperlipidemia)    .  CAD (coronary artery disease)      a. 5/08: s/p DES to PDA and DES to LAD; b. 1/09: s/p DES to pLAD; c. 1/11: s/p DES to LAD x 2, d. LHC (3/12 in [place] - EF 55%, mild ant HK, nl LM, LAD stents ok, oLAD 30, RCA stents ok; e. 4/13: s/p DES to RCA, f. 1/14: dLM 20-30, mOM3 20-30, mLAD 80 ISR => s/p 2.5x16 mm Promus Element DES; EF 45-50%   .  LBBB (left bundle branch block)    .  History of depression    .  COPD (chronic obstructive pulmonary disease)    .  Ischemic cardiomyopathy      a. Echo (11/14): EF 25%   .  Fibromyalgia    .  Syncope  11/14     LifeVest placed    Past Surgical History   Procedure  Laterality  Date   .  Angioplasty       stent 2011, 2010, 2009   .  Orif ankle fracture   [id number]   .  Tubal ligation   1989   .  Coronary stent placement   02/2012    Current Outpatient Prescriptions   Medication  Sig  Dispense  Refill   .  albuterol (PROAIR HFA) 108 (90 BASE) MCG/ACT inhaler  Inhale 2 puffs into the lungs every 4 (four) hours as needed for shortness of breath.     Marland Kitchen.  aspirin 81 MG chewable tablet  Chew 81 mg by mouth daily with breakfast.     .  Calcium Carbonate-Vit D-Min (CALCIUM 1200) 1200-1000 MG-UNIT CHEW  Chew 1 each by  mouth daily.     .  cetirizine (ZYRTEC) 10 MG tablet  Take 10 mg by mouth daily.     .  Cholecalciferol (VITAMIN D) 2000 UNITS CAPS  Take 2,000 Units by mouth daily.     .  clopidogrel (PLAVIX) 75 MG tablet  Take 75 mg by mouth daily.     Marland Kitchen.  escitalopram (LEXAPRO) 20 MG tablet  Take 20 mg by mouth daily.     Marland Kitchen.  guaiFENesin (MUCINEX) 600 MG 12 hr tablet  Take 600 mg by mouth 2 (two) times daily as needed.     Marland Kitchen.  HYDROcodone-acetaminophen (NORCO/VICODIN) 5-325 MG per tablet  Take 1 tablet by mouth every 4 (four) hours as needed.     .  lactulose (CHRONULAC) 10 GM/15ML solution  Take 10-20 g by mouth 2 (two) times daily as needed.     .  lidocaine (LIDODERM) 5 %  Place 1 patch onto the skin daily. Remove after 12 hours     .  lisinopril (PRINIVIL,ZESTRIL) 5 MG tablet  Take 1 tablet (5 mg total) by mouth daily.  30 tablet  4   .  lovastatin (MEVACOR) 40 MG tablet  Take 1 tablet (40 mg total) by mouth daily with supper.     .  metoprolol succinate (TOPROL-XL) 100 MG 24 hr tablet  Take 1 tablet (100 mg total) by mouth daily. Take with or immediately following a meal.  90 tablet  3   .  montelukast (SINGULAIR) 10 MG tablet  Take 10 mg by mouth every morning.     .  nicotine (NICODERM CQ - DOSED IN MG/24 HOURS) 21 mg/24hr patch  Place 1 patch (21 mg total) onto the skin daily.  14 patch  2   .  nitroGLYCERIN (NITROSTAT) 0.4 MG SL tablet  Place 1 tablet (0.4 mg total) under the tongue every 5 (five) minutes x 3 doses as needed. If no relief after 3rd dose, proceed to ED  25 tablet  11   .  pantoprazole (PROTONIX) 40 MG tablet  Take 1 tablet (40 mg total) by mouth daily.  30 tablet  4   .  rOPINIRole (REQUIP) 0.5 MG tablet  Take 1-2 mg by mouth at bedtime.     Marland Kitchen  SALINE NASAL SPRAY NA  Place 1-2 sprays into the nose as directed.     Marland Kitchen  SPIRIVA HANDIHALER 18 MCG inhalation capsule  Place 1 capsule into inhaler and inhale daily.     .  traMADol (ULTRAM) 50 MG tablet  Take 2 tablets by mouth 2 (two) times  daily.     .  zoledronic acid (RECLAST) 5 MG/100ML SOLN injection  Inject 5 mg into the vein once.     .  nicotine (NICODERM CQ - DOSED IN MG/24 HOURS) 14 mg/24hr patch  Place 1 patch (14 mg total) onto the skin daily.  14 patch  0   .  nicotine (NICODERM CQ - DOSED IN MG/24 HR) 7 mg/24hr patch  Place 1 patch (7 mg total) onto the skin daily.  14 patch  0    No current facility-administered medications for this visit.    Allergies   Allergen  Reactions   .  Tape  Rash and Other (See Comments)     Severe Reaction (latex tape): Burned hand, Redness-took days to clear up (03/02/12)   .  Codeine  Nausea And Vomiting   .  Prozac [Fluoxetine Hcl]  Other (See Comments)     Mean/violent thoughts   .  Fosamax [Alendronate Sodium]  Nausea Only and Other (See Comments)     Stomach pain   .  Keflex [Cephalexin]  Nausea Only   .  Lipitor [Atorvastatin]  Other (See Comments)     Leg pain    History    Social History   .  Marital Status:  Married     Spouse Name:  N/A     Number of Children:  N/A   .  Years of Education:  N/A    Occupational History   .  Not on file.    Social History Main Topics   .  Smoking status:  Current Every Day Smoker -- 0.50 packs/day for 21 years     Types:  Cigarettes     Start date:  03/09/1991   .  Smokeless tobacco:  Never Used   .  Alcohol Use:  No   .  Drug Use:  Not on file   .  Sexual Activity:  Not on file    Other Topics  Concern   .  Not on file    Social History Narrative    Lives in [place]    Family History   Problem  Relation  Age of Onset   .  Breast cancer  Mother    .  Heart failure  Brother    .  Breast cancer  Sister    .  Breast cancer  Sister    .  Uterine cancer  Sister    ROS- All systems are reviewed and are negative except as outlined in the HPI above  Physical Exam:  Filed Vitals:   06/02/13 1145  BP: 118/83  Pulse: 64  Temp: 98 F (36.7 C)  Resp: 18   GEN- The patient is pleasant appearing, alert and oriented x 3  today.  Head- normocephalic, atraumatic  Eyes- Sclera clear, conjunctiva pink  Ears- hearing intact  Oropharynx- clear  Neck- supple, no JVP  Lymph- no cervical lymphadenopathy  Lungs- Clear to ausculation bilaterally, normal work of breathing  Heart- Regular rate and rhythm, no murmurs, rubs or gallops, PMI not laterally displaced  GI- soft, NT, ND, + BS  Extremities- no clubbing, cyanosis, or edema  Neuro- strength and sensation are intact   ekg 11/14 reveals sinus rhythm with LBBB (QRS > 150 msec)  3/15 echo is reviewed  (EF 30%)  Epic notes from hospitalization as well as Dr Verna Czech follow-up notes are reviewed   Assessment and Plan:  The patient has an ischemic CM (EF 30%), NYHA Class III CHF, and CAD. She also has LBBB with prior syncope. She meets criteria for a CRT-D device. She has been treated with an optimal regimen but continues to have CHF symptoms. Risks, benefits, alternatives to BiV ICD implantation were discussed in detail with the patient today. The patient understands that the risks include but are not limited to bleeding, infection, pneumothorax, perforation, tamponade, vascular damage, renal failure, MI, stroke, death, inappropriate shocks, and lead dislodgement and wishes to proceed. We will therefore schedule device implantation at the next available time.  ICD Criteria  Current LVEF: 30% ;Obtained > 6 months ago.   NYHA Functional Classification: Class III  Heart Failure History:  Yes, Duration of heart failure since onset is > 9 months  Non-Ischemic Dilated Cardiomyopathy History:  no  Atrial Fibrillation/Atrial Flutter:  No.  Ventricular Tachycardia History:  No.  Cardiac Arrest History:  No  History of Syndromes with Risk of Sudden Death:  No.  Previous ICD:  No.  Electrophysiology Study: No.  Prior MI: Yes, Most recent MI timeframe is > 40 days.  PPM: No.  OSA:  No  Patient Life Expectancy of >=1 year: Yes.  Anticoagulation Therapy:   Patient is NOT on anticoagulation therapy.   Beta Blocker Therapy:  Yes.   Ace Inhibitor/ARB Therapy:  Yes.

## 2013-06-03 ENCOUNTER — Ambulatory Visit (HOSPITAL_COMMUNITY): Payer: PRIVATE HEALTH INSURANCE

## 2013-06-03 DIAGNOSIS — I5022 Chronic systolic (congestive) heart failure: Secondary | ICD-10-CM

## 2013-06-03 DIAGNOSIS — I2589 Other forms of chronic ischemic heart disease: Secondary | ICD-10-CM

## 2013-06-03 DIAGNOSIS — I509 Heart failure, unspecified: Secondary | ICD-10-CM

## 2013-06-03 LAB — BASIC METABOLIC PANEL
BUN: 9 mg/dL (ref 6–23)
CHLORIDE: 104 meq/L (ref 96–112)
CO2: 23 meq/L (ref 19–32)
Calcium: 9.4 mg/dL (ref 8.4–10.5)
Creatinine, Ser: 0.61 mg/dL (ref 0.50–1.10)
GFR calc Af Amer: >90 mL/min (ref >90)
GFR calc non Af Amer: >90 mL/min (ref >90)
GLUCOSE: 149 mg/dL — AB (ref 70–99)
POTASSIUM: 3.5 meq/L — AB (ref 3.7–5.3)
SODIUM: 138 meq/L (ref 137–147)

## 2013-06-03 MED ORDER — YOU HAVE A PACEMAKER BOOK
Freq: Once | Status: AC
  Administered 2013-06-03: 03:00:00
  Filled 2013-06-03: qty 1

## 2013-06-03 NOTE — Discharge Instructions (Signed)
° ° °  Supplemental Discharge Instructions for  Defibrillator Patients  Activity No heavy lifting or vigorous activity with your left/right arm for 6 to 8 weeks.  Do not raise your left/right arm above your head for one week.  Gradually raise your affected arm as drawn below.             06/06/13                             06/07/13                      06/08/13                 06/09/13  NO DRIVING for  6 months after your original episode of passing out   ; you may begin driving on   5/63/89  .  WOUND CARE   Keep the wound area clean and dry.  Do not get this area wet for one week. No showers for one week; you may shower on  06/10/13   .   The tape/steri-strips on your wound will fall off; do not pull them off.  No bandage is needed on the site.  DO  NOT apply any creams, oils, or ointments to the wound area.   If you notice any drainage or discharge from the wound, any swelling or bruising at the site, or you develop a fever > 101? F after you are discharged home, call the office at once.  Special Instructions   You are still able to use cellular telephones; use the ear opposite the side where you have your pacemaker/defibrillator.  Avoid carrying your cellular phone near your device.   When traveling through airports, show security personnel your identification card to avoid being screened in the metal detectors.  Ask the security personnel to use the hand wand.   Avoid arc welding equipment, MRI testing (magnetic resonance imaging), TENS units (transcutaneous nerve stimulators).  Call the office for questions about other devices.   Avoid electrical appliances that are in poor condition or are not properly grounded.   Microwave ovens are safe to be near or to operate.  Additional information for defibrillator patients should your device go off:   If your device goes off ONCE and you feel fine afterward, notify the device clinic nurses.   If your device goes off ONCE and you do not feel well  afterward, call 911.   If your device goes off TWICE, call 911.   If your device goes off THREE times in one day, call 911.  DO NOT DRIVE YOURSELF OR A FAMILY MEMBER WITH A DEFIBRILLATOR TO THE HOSPITAL--CALL 911.

## 2013-06-03 NOTE — Discharge Summary (Signed)
ELECTROPHYSIOLOGY PROCEDURE DISCHARGE SUMMARY    Patient ID: Humberto SealsSharon K Fennell,  MRN: [id number], DOB/AGE: [birth year] 63 y.o.  Admit date: 06/02/2013 Discharge date: 06/03/2013  Primary Care Physician: Bedelia PersonAARON,CAREN T, MD Primary Cardiologist: Branch Electrophysiologist: Nai Borromeo  Primary Discharge Diagnosis:  ICM, CHF, and LBBB status post CRTD implantation this admission  Secondary Discharge Diagnosis:  1.  Hypertension 2.  CAD - a. 5/08: s/p DES to PDA and DES to LAD; b. 1/09: s/p DES to pLAD; c. 1/11: s/p DES to LAD x 2, d. LHC (3/12 in [place] - EF 55%, mild ant HK, nl LM, LAD stents ok, oLAD 30, RCA stents ok; e. 4/13: s/p DES to RCA, f. 1/14: dLM 20-30, mOM3 20-30, mLAD 80 ISR => s/p 2.5x16 mm Promus Element DES; EF 45-50%  3.  COPD 4.  Fibromyalgia   Allergies  Allergen Reactions  . Tape Rash and Other (See Comments)    Severe Reaction (latex tape): Burned hand, Redness-took days to clear up (03/02/12)  . Codeine Nausea And Vomiting  . Prozac [Fluoxetine Hcl] Other (See Comments)    Mean/violent thoughts  . Fosamax [Alendronate Sodium] Nausea Only and Other (See Comments)    Stomach pain  . Keflex [Cephalexin] Nausea Only  . Lipitor [Atorvastatin] Other (See Comments)    Leg pain     Procedures This Admission:  1.  Implantation of a MDT CRT ICD on 06-02-13 by Dr Johney FrameAllred.  The patient received a Med[employer]onic model number VivaQuadXT ICD with model number 5076 right atrial lead, 6935 right ventricular lead, and 4598 left ventricular lead.  DFT's were deferred at time of implant.  There were no immediate post procedure complications. 2.  CXR on 06-03-13 demonstrated no pneumothorax status post device implantation.   Brief HPI: Humberto SealsSharon K Rex is a 63 y.o. female was referred to electrophysiology in the outpatient setting for consideration of ICD implantation. She presented 11/14 with syncope of unclear etiology. She was found to have MI but no lesions amenable to  intervention by cath. Plan was for LifeVest for 40 days and then repeat echo and proceed with BiV ICD. Repeat echo demonstrated persistently decreased EF despite guideline directed therapy. Risks, benefits, and alternatives to ICD implantation were reviewed with the patient who wished to proceed.   Hospital Course:  The patient was admitted and underwent implantation of a MDT CRTD with details as outlined above.   She was monitored on telemetry overnight which demonstrated sinus rhythm with ventricular pacing.  Left chest was without hematoma or ecchymosis.  The device was interrogated and found to be functioning normally.  CXR was obtained and demonstrated no pneumothorax status post device implantation.  Wound care, arm mobility, and restrictions were reviewed with the patient.  Dr Johney FrameAllred examined the patient and considered them stable for discharge to home.   The patient's discharge medications include an ACE-I (linopril) and beta blocker (metoprolol).   Discharge Vitals: Blood pressure 164/78, pulse 57, temperature 97.4 F (36.3 C), temperature source Oral, resp. rate 18, height 5' (1.524 m), weight 179 lb 14.3 oz (81.6 kg), SpO2 96.00%.  Physical Exam: Filed Vitals:   06/02/13 1705 06/02/13 1954 06/03/13 0020 06/03/13 0438  BP: 119/73 160/68 120/74 164/78  Pulse: 72 65 64 57  Temp: 97.6 F (36.4 C) 97.8 F (36.6 C) 97.4 F (36.3 C) 97.4 F (36.3 C)  TempSrc: Oral Oral Oral Oral  Resp: 18 17 18 18   Height:   5' (1.524 m)   Weight:  179 lb 14.3 oz (81.6 kg)   SpO2: 98% 97% 97% 96%    GEN- The patient is well appearing, alert and oriented x 3 today.   Head- normocephalic, atraumatic Eyes-  Sclera clear, conjunctiva pink Ears- hearing intact Oropharynx- clear Neck- supple, no JVP Lymph- no cervical lymphadenopathy Lungs- Clear to ausculation bilaterally, normal work of breathing Heart- Regular rate and rhythm, no murmurs, rubs or gallops, PMI not laterally displaced GI- soft, NT,  ND, + BS Extremities- no clubbing, cyanosis, or edema MS- no significant deformity or atrophy Skin- no hematoma Neuro- strength and sensation are intact   Labs:   Lab Results  Component Value Date   WBC 8.1 06/02/2013   HGB 15.2* 06/02/2013   HCT 44.1 06/02/2013   MCV 91.1 06/02/2013   PLT 256 06/02/2013    Recent Labs Lab 06/03/13 0500  NA 138  K 3.5*  CL 104  CO2 23  BUN 9  CREATININE 0.61  CALCIUM 9.4  GLUCOSE 149*     Discharge Medications:    Medication List    ASK your doctor about these medications       aspirin 81 MG chewable tablet  Chew 81 mg by mouth daily with breakfast.     cetirizine 10 MG tablet  Commonly known as:  ZYRTEC  Take 10 mg by mouth daily.     clopidogrel 75 MG tablet  Commonly known as:  PLAVIX  Take 75 mg by mouth daily.     escitalopram 20 MG tablet  Commonly known as:  LEXAPRO  Take 20 mg by mouth daily.     furosemide 20 MG tablet  Commonly known as:  LASIX  Take 20 mg by mouth daily.     HYDROcodone-acetaminophen 5-325 MG per tablet  Commonly known as:  NORCO/VICODIN  Take 1 tablet by mouth every 4 (four) hours as needed (for pain).     lactulose 10 GM/15ML solution  Commonly known as:  CHRONULAC  Take 10-20 g by mouth 2 (two) times daily as needed (for constipation).     lidocaine 5 %  Commonly known as:  LIDODERM  Place 1 patch onto the skin daily as needed (for pain). Remove after 12 hours     lisinopril 5 MG tablet  Commonly known as:  PRINIVIL,ZESTRIL  Take 1 tablet (5 mg total) by mouth daily.     metoprolol succinate 100 MG 24 hr tablet  Commonly known as:  TOPROL-XL  Take 1 tablet (100 mg total) by mouth daily. Take with or immediately following a meal.     montelukast 10 MG tablet  Commonly known as:  SINGULAIR  Take 10 mg by mouth every morning.     nicotine 21 mg/24hr patch  Commonly known as:  NICODERM CQ - dosed in mg/24 hours  Place 21 mg onto the skin daily.     nitroGLYCERIN 0.4 MG SL tablet   Commonly known as:  NITROSTAT  Place 1 tablet (0.4 mg total) under the tongue every 5 (five) minutes x 3 doses as needed. If no relief after 3rd dose, proceed to ED     pantoprazole 40 MG tablet  Commonly known as:  PROTONIX  Take 1 tablet (40 mg total) by mouth daily.     PROAIR HFA 108 (90 BASE) MCG/ACT inhaler  Generic drug:  albuterol  Inhale 2 puffs into the lungs every 4 (four) hours as needed for shortness of breath.     rOPINIRole 0.5 MG tablet  Commonly  known as:  REQUIP  Take 1 mg by mouth at bedtime.     SALINE NASAL SPRAY NA  Place 1-2 sprays into the nose as needed (for congestion).     SPIRIVA HANDIHALER 18 MCG inhalation capsule  Generic drug:  tiotropium  Place 1 capsule into inhaler and inhale daily.     spironolactone 25 MG tablet  Commonly known as:  ALDACTONE  Take 12.5 mg by mouth daily.     traMADol 50 MG tablet  Commonly known as:  ULTRAM  Take 2 tablets by mouth 2 (two) times daily.        Disposition:       Future Appointments Provider Department Dept Phone   06/10/2013 10:00 AM [facility] [facility] 1 [facility] [phone]       Duration of Discharge Encounter: Greater than 30 minutes including physician time.  Signed,  Hillis Range MD

## 2013-06-03 NOTE — Care Management Note (Addendum)
  Page 1 of 1   06/03/2013     10:45:37 AM CARE MANAGEMENT NOTE 06/03/2013  Patient:  Becky [name], Becky [name]   Account Number:  [id number]  Date Initiated:  06/04/2013  Documentation initiated by:  Donato Schultz  Subjective/Objective Assessment:   admitted with CHF     Action/Plan:   CM to follow for dispostiion needs   Anticipated DC Date:  06/03/2013   Anticipated DC Plan:  HOME/SELF CARE         Choice offered to / List presented to:             Status of service:  Completed, signed off Medicare Important Message given?   (If response is "NO", the following Medicare IM given date fields will be blank) Date Medicare IM given:   Date Additional Medicare IM given:    Discharge Disposition:  HOME/SELF CARE  Per UR Regulation:    If discussed at Long Length of Stay Meetings, dates discussed:    Comments:

## 2013-06-07 ENCOUNTER — Other Ambulatory Visit (HOSPITAL_COMMUNITY): Payer: Self-pay | Admitting: Cardiology

## 2013-06-08 ENCOUNTER — Other Ambulatory Visit: Payer: Self-pay | Admitting: *Deleted

## 2013-06-08 ENCOUNTER — Other Ambulatory Visit (HOSPITAL_COMMUNITY): Payer: Self-pay | Admitting: Cardiology

## 2013-06-08 MED ORDER — LISINOPRIL 5 MG PO TABS: 5.0000 mg | ORAL_TABLET | Freq: Every day | ORAL | Status: DC

## 2013-06-08 MED ORDER — PANTOPRAZOLE SODIUM 40 MG PO TBEC
40.0000 mg | DELAYED_RELEASE_TABLET | Freq: Every day | ORAL | Status: DC
Start: 2013-06-08 — End: 2014-05-04

## 2013-06-10 ENCOUNTER — Ambulatory Visit (INDEPENDENT_AMBULATORY_CARE_PROVIDER_SITE_OTHER): Payer: PRIVATE HEALTH INSURANCE | Admitting: *Deleted

## 2013-06-10 ENCOUNTER — Encounter: Payer: Self-pay | Admitting: Internal Medicine

## 2013-06-10 DIAGNOSIS — I255 Ischemic cardiomyopathy: Secondary | ICD-10-CM

## 2013-06-10 DIAGNOSIS — I2589 Other forms of chronic ischemic heart disease: Secondary | ICD-10-CM

## 2013-06-10 LAB — MDC_IDC_ENUM_SESS_TYPE_INCLINIC
Battery Remaining Longevity: 37 mo
Battery Voltage: 2.99 V
Brady Statistic AS VS Percent: 0.15 %
Date Time Interrogation Session: 20150424103840
Lead Channel Impedance Value: 4047 Ohm
Lead Channel Impedance Value: 646 Ohm
Lead Channel Impedance Value: 760 Ohm
Lead Channel Impedance Value: 874 Ohm
Lead Channel Pacing Threshold Amplitude: 0.75 V
Lead Channel Pacing Threshold Amplitude: 3 V
Lead Channel Pacing Threshold Pulse Width: 0.4 ms
Lead Channel Pacing Threshold Pulse Width: 0.4 ms
Lead Channel Pacing Threshold Pulse Width: 0.8 ms
Lead Channel Sensing Intrinsic Amplitude: 7.8 mV
Lead Channel Setting Pacing Amplitude: 2.5 V
Lead Channel Setting Pacing Amplitude: 3 V
Lead Channel Setting Pacing Pulse Width: 0.4 ms
Lead Channel Setting Sensing Sensitivity: 0.9 mV
MDC IDC MSMT LEADCHNL LV IMPEDANCE VALUE: 1178 Ohm
MDC IDC MSMT LEADCHNL LV IMPEDANCE VALUE: 1330 Ohm
MDC IDC MSMT LEADCHNL LV IMPEDANCE VALUE: 4047 Ohm
MDC IDC MSMT LEADCHNL LV IMPEDANCE VALUE: 4047 Ohm
MDC IDC MSMT LEADCHNL RA IMPEDANCE VALUE: 570 Ohm
MDC IDC MSMT LEADCHNL RA PACING THRESHOLD AMPLITUDE: 0.75 V
MDC IDC MSMT LEADCHNL RA SENSING INTR AMPL: 3.3 mV
MDC IDC SET LEADCHNL LV PACING AMPLITUDE: 3 V
MDC IDC SET LEADCHNL RV PACING PULSEWIDTH: 0.4 ms
MDC IDC STAT BRADY AP VP PERCENT: 1.59 %
MDC IDC STAT BRADY AP VS PERCENT: 0 %
MDC IDC STAT BRADY AS VP PERCENT: 98.26 %
MDC IDC STAT BRADY RA PERCENT PACED: 1.59 %
MDC IDC STAT BRADY RV PERCENT PACED: 99.85 %
Zone Setting Detection Interval: 350 ms
Zone Setting Detection Interval: 400 ms

## 2013-06-10 NOTE — Progress Notes (Signed)
Wound check ICD in clinic. Normal device function. LV threshold increase from implant. Vector express performed showing LV1 to RV coil with best threshold numbers. LV threshold 3.00@ 0.29ms. No diaphramatic stimulation. Turned on other 1:1 SVT and changed max lead impedances. Changed RA output from 1.50 to 2.00. Site well healed with minimal swelling. No redness. ROV in 3 mths w/JA in [place].

## 2013-07-05 ENCOUNTER — Encounter: Payer: Self-pay | Admitting: Internal Medicine

## 2013-07-15 ENCOUNTER — Encounter: Payer: Self-pay | Admitting: Internal Medicine

## 2013-07-15 ENCOUNTER — Ambulatory Visit (INDEPENDENT_AMBULATORY_CARE_PROVIDER_SITE_OTHER): Payer: 59 | Admitting: Internal Medicine

## 2013-07-15 ENCOUNTER — Ambulatory Visit (HOSPITAL_COMMUNITY)
Admission: RE | Admit: 2013-07-15 | Discharge: 2013-07-15 | Disposition: A | Discharge status: Home / Self Care | Payer: Medicare Other | Source: Ambulatory Visit | Attending: Internal Medicine | Admitting: Internal Medicine

## 2013-07-15 VITALS — BP 119/74 | HR 60 | Ht 60.0 in | Wt 184.8 lb

## 2013-07-15 DIAGNOSIS — I10 Essential (primary) hypertension: Secondary | ICD-10-CM | POA: Insufficient documentation

## 2013-07-15 DIAGNOSIS — I2589 Other forms of chronic ischemic heart disease: Secondary | ICD-10-CM

## 2013-07-15 DIAGNOSIS — Z09 Encounter for follow-up examination after completed treatment for conditions other than malignant neoplasm: Secondary | ICD-10-CM | POA: Diagnosis present

## 2013-07-15 DIAGNOSIS — Z950 Presence of cardiac pacemaker: Secondary | ICD-10-CM | POA: Insufficient documentation

## 2013-07-15 DIAGNOSIS — I255 Ischemic cardiomyopathy: Secondary | ICD-10-CM

## 2013-07-15 DIAGNOSIS — Z95810 Presence of automatic (implantable) cardiac defibrillator: Secondary | ICD-10-CM

## 2013-07-15 LAB — MDC_IDC_ENUM_SESS_TYPE_INCLINIC
Battery Voltage: 3.1 V
Brady Statistic AP VS Percent: 0.57 %
Brady Statistic AS VP Percent: 67.1 %
Brady Statistic AS VS Percent: 1.02 %
Brady Statistic RA Percent Paced: 31.88 %
Brady Statistic RV Percent Paced: 7.23 %
Date Time Interrogation Session: 20150529111801
HighPow Impedance: 190 Ohm
HighPow Impedance: 63 Ohm
Lead Channel Impedance Value: 456 Ohm
Lead Channel Impedance Value: 494 Ohm
Lead Channel Pacing Threshold Amplitude: 0.5 V
Lead Channel Pacing Threshold Pulse Width: 0.4 ms
Lead Channel Pacing Threshold Pulse Width: 0.4 ms
Lead Channel Sensing Intrinsic Amplitude: 10.375 mV
Lead Channel Sensing Intrinsic Amplitude: 4.25 mV
Lead Channel Sensing Intrinsic Amplitude: 9.5 mV
Lead Channel Setting Pacing Amplitude: 3 V
Lead Channel Setting Pacing Amplitude: 3.5 V
Lead Channel Setting Pacing Pulse Width: 0.8 ms
MDC IDC MSMT BATTERY REMAINING LONGEVITY: 80 mo
MDC IDC MSMT LEADCHNL LV PACING THRESHOLD AMPLITUDE: 2 V
MDC IDC MSMT LEADCHNL LV PACING THRESHOLD PULSEWIDTH: 0.8 ms
MDC IDC MSMT LEADCHNL RA SENSING INTR AMPL: 4 mV
MDC IDC MSMT LEADCHNL RV PACING THRESHOLD AMPLITUDE: 0.625 V
MDC IDC SET LEADCHNL RV PACING AMPLITUDE: 3.5 V
MDC IDC SET LEADCHNL RV PACING PULSEWIDTH: 0.4 ms
MDC IDC SET LEADCHNL RV SENSING SENSITIVITY: 0.3 mV
MDC IDC SET ZONE DETECTION INTERVAL: 300 ms
MDC IDC SET ZONE DETECTION INTERVAL: 360 ms
MDC IDC STAT BRADY AP VP PERCENT: 31.31 %
Zone Setting Detection Interval: 350 ms
Zone Setting Detection Interval: 360 ms

## 2013-07-15 NOTE — Patient Instructions (Signed)
Your physician recommends that you continue on your current medications as directed. Please refer to the Current Medication list given to you today. A chest x-ray takes a picture of the organs and structures inside the chest, including the heart, lungs, and blood vessels. This test can show several things, including, whether the heart is enlarges; whether fluid is building up in the lungs; and whether pacemaker / defibrillator leads are still in place. Your physician recommends that you schedule a follow-up appointment in: with Dr. Johney Frame on August 28,2015 @9 :00 am.

## 2013-07-15 NOTE — Progress Notes (Signed)
PCP: Becky Person, MD Primary Cardiologist:  Dr Becky [name] Becky [name] is a 63 y.o. female who presents today for electrophysiology followup.  She recently had device interrogation (wound check 4/15) which revealed increase in LV lead pacing threshold.  I have therefore asked her to return today for repeat interrogation and CXR.  Past Medical History  Diagnosis Date  . GERD (gastroesophageal reflux disease)   . Hypertension   . HLD (hyperlipidemia)   . CAD (coronary artery disease)     a. 5/08: s/p DES to PDA and DES to LAD; b. 1/09: s/p DES to pLAD; c. 1/11: s/p DES to LAD x 2, d. LHC (3/12 in [place] - EF 55%, mild ant HK, nl LM, LAD stents ok, oLAD 30, RCA stents ok;  e.  4/13:  s/p DES to RCA, f. 1/14: dLM 20-30, mOM3 20-30, mLAD 80 ISR => s/p 2.5x16 mm Promus Element DES; EF 45-50%   . LBBB (left bundle branch block)   . History of depression   . COPD (chronic obstructive pulmonary disease)   . Ischemic cardiomyopathy     a. Echo (11/14):  EF 25%   . Fibromyalgia   . Syncope 11/14    LifeVest placed  . CHF (congestive heart failure)   . Depression   . Heart murmur     "all my life"  . Myocardial infarction 12/2012  . Asthma   . Pneumonia     "when I was a child"  . Chronic bronchitis   . Daily headache     "might be from sinus" (06/02/2013)  . Arthritis     "all my joints; neck, back, legs, arms, elbows" (06/02/2013)  . Chronic lower back pain     "into pelvis region" (06/02/2013)  . Anxiety   . Automatic implantable cardioverter-defibrillator in situ    Past Surgical History  Procedure Laterality Date  . Orif ankle fracture Right 10/21/2009  . Tubal ligation  1989  . Coronary stent placement  02/2012  . [name]-ventricular implantable cardioverter defibrillator  (crt-d)  06/02/2013    MDT VivaQuad CRTD implanted by Dr Johney Frame for ICM, CHF  . Hysteroscopy w/ endometrial ablation  ~ 1998  . Coronary angioplasty with stent placement  2007; 2009; 2011    "?2 +1 +1 "  .  Cardiac catheterization  12/2012    S/P MI    Current Outpatient Prescriptions  Medication Sig Dispense Refill  . albuterol (PROAIR HFA) 108 (90 BASE) MCG/ACT inhaler Inhale 2 puffs into the lungs every 4 (four) hours as needed for shortness of breath.       Marland Kitchen aspirin 81 MG chewable tablet Chew 81 mg by mouth daily with breakfast.      . cetirizine (ZYRTEC) 10 MG tablet Take 10 mg by mouth daily.      . clopidogrel (PLAVIX) 75 MG tablet Take 75 mg by mouth daily.       Marland Kitchen escitalopram (LEXAPRO) 20 MG tablet Take 20 mg by mouth daily.       . furosemide (LASIX) 20 MG tablet Take 20 mg by mouth daily.      Marland Kitchen HYDROcodone-acetaminophen (NORCO/VICODIN) 5-325 MG per tablet Take 1 tablet by mouth every 4 (four) hours as needed (for pain).       Marland Kitchen lactulose (CHRONULAC) 10 GM/15ML solution Take 10-20 g by mouth 2 (two) times daily as needed (for constipation).       Marland Kitchen lidocaine (LIDODERM) 5 % Place 1 patch onto the skin daily  as needed (for pain). Remove after 12 hours      . lisinopril (PRINIVIL,ZESTRIL) 5 MG tablet Take 1 tablet (5 mg total) by mouth daily.  30 tablet  6  . metoprolol succinate (TOPROL-XL) 100 MG 24 hr tablet Take 1 tablet (100 mg total) by mouth daily. Take with or immediately following a meal.  90 tablet  3  . montelukast (SINGULAIR) 10 MG tablet Take 10 mg by mouth every morning.       . nitroGLYCERIN (NITROSTAT) 0.4 MG SL tablet Place 1 tablet (0.4 mg total) under the tongue every 5 (five) minutes x 3 doses as needed. If no relief after 3rd dose, proceed to ED  25 tablet  11  . pantoprazole (PROTONIX) 40 MG tablet Take 1 tablet (40 mg total) by mouth daily.  30 tablet  6  . rOPINIRole (REQUIP) 0.5 MG tablet Take 1 mg by mouth at bedtime.       Marland Kitchen. SALINE NASAL SPRAY NA Place 1-2 sprays into the nose as needed (for congestion).       . SPIRIVA HANDIHALER 18 MCG inhalation capsule Place 1 capsule into inhaler and inhale daily.      Marland Kitchen. spironolactone (ALDACTONE) 25 MG tablet Take 12.5  mg by mouth daily.      . traMADol (ULTRAM) 50 MG tablet Take 2 tablets by mouth 2 (two) times daily.        No current facility-administered medications for this visit.    Physical Exam: Filed Vitals:   07/15/13 0830  BP: 119/74  Pulse: 60  Height: 5' (1.524 m)  Weight: 184 lb 12.8 oz (83.825 kg)    GEN- The patient is well appearing, alert and oriented x 3 today.   Head- normocephalic, atraumatic Eyes-  Sclera clear, conjunctiva pink Ears- hearing intact Oropharynx- clear Lungs- Clear to ausculation bilaterally, normal work of breathing Chest- ICD pocket is well healed Heart- Regular rate and rhythm, no murmurs, rubs or gallops, PMI not laterally displaced GI- soft, NT, ND, + BS Extremities- no clubbing, cyanosis, or edema  ICD interrogation- reviewed in detail today,  See PACEART report  Assessment and Plan:  1.  Chronic systolic dysfunction/ Ischemic CM euvolemic today Stable on an appropriate medical regimen Normal ICD function, LV threshold is elevated but stable.  Hopefully we can avoid lead revision.  I will obtain a CXR today See Arita Missace Art report  Return to see me in 3 months

## 2013-09-05 ENCOUNTER — Encounter: Payer: Self-pay | Admitting: Cardiology

## 2013-09-05 ENCOUNTER — Ambulatory Visit (INDEPENDENT_AMBULATORY_CARE_PROVIDER_SITE_OTHER): Payer: Medicare Other | Admitting: Cardiology

## 2013-09-05 VITALS — BP 148/86 | HR 67 | Ht 60.0 in | Wt 187.0 lb

## 2013-09-05 DIAGNOSIS — I5022 Chronic systolic (congestive) heart failure: Secondary | ICD-10-CM

## 2013-09-05 DIAGNOSIS — I2589 Other forms of chronic ischemic heart disease: Secondary | ICD-10-CM

## 2013-09-05 DIAGNOSIS — I255 Ischemic cardiomyopathy: Secondary | ICD-10-CM

## 2013-09-05 MED ORDER — METOPROLOL SUCCINATE ER 100 MG PO TB24: ORAL_TABLET | ORAL | Status: DC

## 2013-09-05 NOTE — Progress Notes (Signed)
Clinical Summary Ms. Becky [name] is a 63 y.o.female seen today for follow up of the following medical problems.   1. ICM  - history of multiple PCIs as described below, [place].  -Admit 01/02/13 with syncope and NSTEMI, [place]. Per notes husband found her down, unclear if seizure related. Patient does not remember events. Trop peaked at 2.09. Cath showed no significant obstructive disease with patent stents, LVEF 25-30% by LV gram and 25-30% by echo during admission.   - titration of medical therapy has been limited due to orthostatic symptoms.  - since last visit repeat echo 04/2013 showed LVEF 30-35%, she has since followed up with EP and had a BiV AICD placed.    - denies any LE edema, no orthopnea - compliant with meds - limiting sodium intake, avoiding NSAIDs  2. OSA - just diagnosed, started on CPAP machine   3. Pneumonia - recent admit Tues through [place] - on oral abx currently and prednisone for a few more days - symptoms improving   Past Medical History  Diagnosis Date  . GERD (gastroesophageal reflux disease)   . Hypertension   . HLD (hyperlipidemia)   . CAD (coronary artery disease)     a. 5/08: s/p DES to PDA and DES to LAD; b. 1/09: s/p DES to pLAD; c. 1/11: s/p DES to LAD x 2, d. LHC (3/12 [place] - EF 55%, mild ant HK, nl LM, LAD stents ok, oLAD 30, RCA stents ok;  e.  4/13:  s/p DES to RCA, f. 1/14: dLM 20-30, mOM3 20-30, mLAD 80 ISR => s/p 2.5x16 mm Promus Element DES; EF 45-50%   . LBBB (left bundle branch block)   . History of depression   . COPD (chronic obstructive pulmonary disease)   . Ischemic cardiomyopathy     a. Echo (11/14):  EF 25%   . Fibromyalgia   . Syncope 11/14    LifeVest placed  . CHF (congestive heart failure)   . Depression   . Heart murmur     "all my life"  . Myocardial infarction 12/2012  . Asthma   . Pneumonia     "when I was a child"  .  Chronic bronchitis   . Daily headache     "might be from sinus" (06/02/2013)  . Arthritis     "all my joints; neck, back, legs, arms, elbows" (06/02/2013)  . Chronic lower back pain     "into pelvis region" (06/02/2013)  . Anxiety   . Automatic implantable cardioverter-defibrillator in situ      Allergies  Allergen Reactions  . Tape Rash and Other (See Comments)    Severe Reaction (latex tape): Burned hand, Redness-took days to clear up (03/02/12)  . Codeine Nausea And Vomiting  . Prozac [Fluoxetine Hcl] Other (See Comments)    Mean/violent thoughts  . Fosamax [Alendronate Sodium] Nausea Only and Other (See Comments)    Stomach pain  . Keflex [Cephalexin] Nausea Only  . Lipitor [Atorvastatin] Other (See Comments)    Leg pain     Current Outpatient Prescriptions  Medication Sig Dispense Refill  . albuterol (PROAIR HFA) 108 (90 BASE) MCG/ACT inhaler Inhale 2 puffs into the lungs every 4 (four) hours as needed for shortness of breath.       Marland Kitchen. aspirin 81 MG chewable tablet Chew 81 mg by mouth daily with breakfast.      . cetirizine (ZYRTEC) 10 MG tablet  Take 10 mg by mouth daily.      . clopidogrel (PLAVIX) 75 MG tablet Take 75 mg by mouth daily.       Marland Kitchen escitalopram (LEXAPRO) 20 MG tablet Take 20 mg by mouth daily.       . furosemide (LASIX) 20 MG tablet Take 20 mg by mouth daily.      Marland Kitchen HYDROcodone-acetaminophen (NORCO/VICODIN) 5-325 MG per tablet Take 1 tablet by mouth every 4 (four) hours as needed (for pain).       Marland Kitchen lactulose (CHRONULAC) 10 GM/15ML solution Take 10-20 g by mouth 2 (two) times daily as needed (for constipation).       Marland Kitchen lidocaine (LIDODERM) 5 % Place 1 patch onto the skin daily as needed (for pain). Remove after 12 hours      . lisinopril (PRINIVIL,ZESTRIL) 5 MG tablet Take 1 tablet (5 mg total) by mouth daily.  30 tablet  6  . metoprolol succinate (TOPROL-XL) 100 MG 24 hr tablet Take 1 tablet (100 mg total) by mouth daily. Take with or immediately following a  meal.  90 tablet  3  . montelukast (SINGULAIR) 10 MG tablet Take 10 mg by mouth every morning.       . nitroGLYCERIN (NITROSTAT) 0.4 MG SL tablet Place 1 tablet (0.4 mg total) under the tongue every 5 (five) minutes x 3 doses as needed. If no relief after 3rd dose, proceed to ED  25 tablet  11  . pantoprazole (PROTONIX) 40 MG tablet Take 1 tablet (40 mg total) by mouth daily.  30 tablet  6  . rOPINIRole (REQUIP) 0.5 MG tablet Take 1 mg by mouth at bedtime.       Marland Kitchen SALINE NASAL SPRAY NA Place 1-2 sprays into the nose as needed (for congestion).       . SPIRIVA HANDIHALER 18 MCG inhalation capsule Place 1 capsule into inhaler and inhale daily.      Marland Kitchen spironolactone (ALDACTONE) 25 MG tablet Take 12.5 mg by mouth daily.      . traMADol (ULTRAM) 50 MG tablet Take 2 tablets by mouth 2 (two) times daily.        No current facility-administered medications for this visit.     Past Surgical History  Procedure Laterality Date  . Orif ankle fracture Right 10/21/2009  . Tubal ligation  1989  . Coronary stent placement  02/2012  . Bi-ventricular implantable cardioverter defibrillator  (crt-d)  06/02/2013    MDT VivaQuad CRTD implanted by Dr Johney Frame for ICM, CHF  . Hysteroscopy w/ endometrial ablation  ~ 1998  . Coronary angioplasty with stent placement  2007; 2009; 2011    "?2 +1 +1 "  . Cardiac catheterization  12/2012    S/P MI     Allergies  Allergen Reactions  . Tape Rash and Other (See Comments)    Severe Reaction (latex tape): Burned hand, Redness-took days to clear up (03/02/12)  . Codeine Nausea And Vomiting  . Prozac [Fluoxetine Hcl] Other (See Comments)    Mean/violent thoughts  . Fosamax [Alendronate Sodium] Nausea Only and Other (See Comments)    Stomach pain  . Keflex [Cephalexin] Nausea Only  . Lipitor [Atorvastatin] Other (See Comments)    Leg pain      Family History  Problem Relation Age of Onset  . Breast cancer Mother   . Heart failure Brother   . Breast cancer  Sister   . Breast cancer Sister   . Uterine cancer Sister  Social History Ms. Becky [name] reports that she has been smoking Cigarettes.  She started smoking about 22 years ago. She has a 23 pack-year smoking history. She has never used smokeless tobacco. Ms. Dolecki reports that she drinks alcohol.   Review of Systems CONSTITUTIONAL: No weight loss, fever, chills, weakness or fatigue.  HEENT: Eyes: No visual loss, blurred vision, double vision or yellow sclerae.No hearing loss, sneezing, congestion, runny nose or sore throat.  SKIN: No rash or itching.  CARDIOVASCULAR: per HPI RESPIRATORY: +cough.  GASTROINTESTINAL: No anorexia, nausea, vomiting or diarrhea. No abdominal pain or blood.  GENITOURINARY: No burning on urination, no polyuria NEUROLOGICAL: No headache, dizziness, syncope, paralysis, ataxia, numbness or tingling in the extremities. No change in bowel or bladder control.  MUSCULOSKELETAL: No muscle, back pain, joint pain or stiffness.  LYMPHATICS: No enlarged nodes. No history of splenectomy.  PSYCHIATRIC: No history of depression or anxiety.  ENDOCRINOLOGIC: No reports of sweating, cold or heat intolerance. No polyuria or polydipsia.  Marland Kitchen   Physical Examination p 67 bp 148/86 Wt 187 lbs BMI 37 Gen: resting comfortably, no acute distress HEENT: no scleral icterus, pupils equal round and reactive, no palptable cervical adenopathy,  CV: RRR, no m/r/g,no JVD Resp: coarse breath sounds LLL GI: abdomen is soft, non-tender, non-distended, normal bowel sounds, no hepatosplenomegaly MSK: extremities are warm, no edema.  Skin: warm, no rash Neuro:  no focal deficits Psych: appropriate affect   Diagnostic Studies 04/2010 Cath [place], [place]: LVEF 55%, mild anterior hypokinesis. LM normal, LAD with stents in prox and mid portion widely patent, LAD ostial 30%. Diags with luminal irregs. LCX with luminial irregs. RCA with distal patent stents  Stent cards  May 2008 DES to PDA,  DES to LAD  Jan 2009 DES to prox LAD  Mar 05 2009 DES to LAD x2  05/2011 DES to RCA  Jan 2014 stent done [place], [place]  03/2010 Echo: LVEF 40%, hypokinesis of the anteroseptum.   01/02/13 Cath  Hemodynamic Findings:  Central aortic pressure: 147/79  Left ventricular pressure: 130/22/26  Angiographic Findings:  Left main: 30% distal stenosis. It appears that the distal left main has a stent that continues into the LAD.  Left Anterior Descending Artery: Large vessel that courses to the apex. The entire proximal and mid LAD is stented. There is mild stent restenosis in the proximal segment. The mid stented segment has diffuse 30% stent restenosis. The distal vessel becomes small in caliber and has mild diffuse plaque. There is a moderate caliber first diagonal branch with mild plaque disease.  Circumflex Artery: Moderate caliber non-dominant vessel with three moderate caliber obtuse marginal branches. No obstructive disease.  Right Coronary Artery: Large dominant vessel with 30% proximal stenosis, heavily calcified mid vessel with patent stent in the mid vessel (no significant restenosis). The distal vessel has diffuse 40-50% stenosis. The posterolateral branch and PDA are moderate caliber, patent vessels with mild plaque disease.  Left Ventricular Angiogram: LVEF=25-30%. Hypokinesis of the antero-apical wall.  Impression:  1. 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 Elevated troponin following syncopal event. No culprit lesions seen.  3. Moderate to severe LV systolic dysfunction.  Recommendations: Continue medical management of CAD. Workup for syncope is underway. Carotid dopplers today. Will check d-dimer as PE is a possibility. She could also have had an arrythmia with LV dysfunction.  Complications: None. The patient tolerated the procedure well.   01/03/13 Echo  - LVEF 25-30%, akinesis of the anteroseptal and apical myocardium,  grade I diastolic dysfunction,    01/03/13 Carotid US  - The vertebral arteries appear patent with antegrade flow. - Findings consistent with1- 39 percent stenosis,high end of scale,involving the right internal carotid artery. - Findings consistent with 40 - 59 percent stenosis, low end of scale,involving the left internal carotid artery. - ICA/CCA ratio. right =1.72. left = 1.52 Other specific details can be found in the table(s) above. Prepared and Electronically Authenticated by  12/13/12 Event monitor  No symptoms reported, sinus rhythm with conduction delay, occasional PVCs.   04/2013 Echo Study Conclusions  - Left ventricle: The cavity size was normal. Wall thickness was increased in a pattern of mild LVH with moderate basal septal hypertrophy. Systolic function was moderately to severely reduced. The estimated ejection fraction was in the range of 30% to 35%. There is akinesis to dyskinesis of the mid-distal anteroseptal and apical myocardium. There is akinesis of the distalinferoseptal myocardium. Doppler parameters are consistent with abnormal left ventricular relaxation (grade 1 diastolic dysfunction). - Ventricular septum: Septal motion showed abnormal function and dyssynergy. - Aortic valve: Mildly calcified annulus. Probably trileaflet; mildly calcified leaflets. No significant regurgitation. - Mitral valve: Calcified annulus. Trivial regurgitation. - Left atrium: The atrium was mildly dilated. - Right atrium: Central venous pressure: 8mm Hg (est). - Tricuspid valve: Trivial regurgitation. - Pulmonary arteries: PA peak pressure: 20mm Hg (S). - Pericardium, extracardiac: There was no pericardial effusion. Impressions:  - MildLVH with moderate basal septal hypertrophy, LVEF 30-35% with wall motion abnormalities as outlined, grade 1 diastolic dysfunction. Mild left atrial enlagement. MAC with trivial mitral regurgitation. MIldlysclerotic aortic valve. Trivial tricuspid regurgitation with normal  PASP 20 mmHg.     Assessment and Plan  1. ICM/Chronic systolic heart failure - LVEF 16-10%, known hx of CAD with last cath with patent stents.  . She is NYHA II, recent BiV AICD placed. She is euvolemic in clinic today  - will increase Toprol XL to 150mg  daily, titration has been slowed by initial orthostatic symptoms that have improved. Will follow on higher dose of Toprol       Antoine Poche, M.D., F.A.C.C.   F/u 1 month

## 2013-09-05 NOTE — Patient Instructions (Signed)
   Increase Toprol XL to 150mg  daily - printed script given today Continue all other medications.   Follow up in  1 month

## 2013-10-07 ENCOUNTER — Ambulatory Visit (INDEPENDENT_AMBULATORY_CARE_PROVIDER_SITE_OTHER): Payer: Medicare Other | Admitting: Cardiology

## 2013-10-07 ENCOUNTER — Encounter: Payer: Self-pay | Admitting: Cardiology

## 2013-10-07 VITALS — BP 136/84 | HR 62 | Ht 60.0 in | Wt 191.0 lb

## 2013-10-07 DIAGNOSIS — I2589 Other forms of chronic ischemic heart disease: Secondary | ICD-10-CM

## 2013-10-07 DIAGNOSIS — I5022 Chronic systolic (congestive) heart failure: Secondary | ICD-10-CM

## 2013-10-07 DIAGNOSIS — I255 Ischemic cardiomyopathy: Secondary | ICD-10-CM

## 2013-10-07 NOTE — Patient Instructions (Signed)
Continue all current medications. Your physician wants you to follow up in:  4 months.  You will receive a reminder letter in the mail one-two months in advance.  If you don't receive a letter, please call our office to schedule the follow up appointment   

## 2013-10-07 NOTE — Progress Notes (Signed)
Clinical Summary Becky [name] is a 63 y.o.female seen today for follow up of the following medical problems.   1. ICM  - history of multiple PCIs as described below, [place].  -Admit 01/02/13 with syncope and NSTEMI, initially presented to [place]. Per notes husband found her down, unclear if seizure related. Patient does not remember events. Trop peaked at 2.09. Cath showed no significant obstructive disease with patent stents, LVEF 25-30% by LV gram and 25-30% by echo during admission.  - titration of medical therapy has been limited due to orthostatic symptoms.  -  repeat echo 04/2013 showed LVEF 30-35%, she has since followed up with EP and had a BiV AICD placed.   - denies any LE edema, no orthopnea  - compliant with meds  - limiting sodium intake, avoiding NSAIDs  - last visit increased Toprol XL to 150mg  daily, noted some increased fatigue that is just now beginning to resolve.    2. OSA  - just diagnosed, started on CPAP machine   3. Hyperlipidemia - lipitor causes leg pains, has tried multiple including pravastatin and lovastatin.   Past Medical History  Diagnosis Date  . GERD (gastroesophageal reflux disease)   . Hypertension   . HLD (hyperlipidemia)   . CAD (coronary artery disease)     a. 5/08: s/p DES to PDA and DES to LAD; b. 1/09: s/p DES to pLAD; c. 1/11: s/p DES to LAD x 2, d. LHC (3/12 in [place] - EF 55%, mild ant HK, nl LM, LAD stents ok, oLAD 30, RCA stents ok;  e.  4/13:  s/p DES to RCA, f. 1/14: dLM 20-30, mOM3 20-30, mLAD 80 ISR => s/p 2.5x16 mm Promus Element DES; EF 45-50%   . LBBB (left bundle Mikkel Charrette block)   . History of depression   . COPD (chronic obstructive pulmonary disease)   . Ischemic cardiomyopathy     a. Echo (11/14):  EF 25%   . Fibromyalgia   . Syncope 11/14    LifeVest placed  . CHF (congestive heart failure)   . Depression   . Heart murmur     "all my life"  . Myocardial infarction 12/2012  .  Asthma   . Pneumonia     "when I was a child"  . Chronic bronchitis   . Daily headache     "might be from sinus" (06/02/2013)  . Arthritis     "all my joints; neck, back, legs, arms, elbows" (06/02/2013)  . Chronic lower back pain     "into pelvis region" (06/02/2013)  . Anxiety   . Automatic implantable cardioverter-defibrillator in situ      Allergies  Allergen Reactions  . Tape Rash and Other (See Comments)    Severe Reaction (latex tape): Burned hand, Redness-took days to clear up (03/02/12)  . Codeine Nausea And Vomiting  . Prozac [Fluoxetine Hcl] Other (See Comments)    Mean/violent thoughts  . Fosamax [Alendronate Sodium] Nausea Only and Other (See Comments)    Stomach pain  . Keflex [Cephalexin] Nausea Only  . Lipitor [Atorvastatin] Other (See Comments)    Leg pain     Current Outpatient Prescriptions  Medication Sig Dispense Refill  . albuterol (PROAIR HFA) 108 (90 BASE) MCG/ACT inhaler Inhale 2 puffs into the lungs every 4 (four) hours as needed for shortness of breath.       Marland Kitchen. aspirin 81 MG chewable tablet Chew 81 mg by mouth daily with breakfast.      .  cetirizine (ZYRTEC) 10 MG tablet Take 10 mg by mouth daily.      . clopidogrel (PLAVIX) 75 MG tablet Take 75 mg by mouth daily.       Marland Kitchen escitalopram (LEXAPRO) 20 MG tablet Take 20 mg by mouth daily.       . Fluticasone-Salmeterol (ADVAIR) 500-50 MCG/DOSE AEPB Inhale 1 puff into the lungs 2 (two) times daily.      . furosemide (LASIX) 20 MG tablet Take 20 mg by mouth daily.      Marland Kitchen HYDROcodone-acetaminophen (NORCO/VICODIN) 5-325 MG per tablet Take 1 tablet by mouth every 4 (four) hours as needed (for pain).       Marland Kitchen lactulose (CHRONULAC) 10 GM/15ML solution Take 10-20 g by mouth 2 (two) times daily as needed (for constipation).       Marland Kitchen levofloxacin (LEVAQUIN) 500 MG tablet Take 500 mg by mouth daily. Will be finished with medication in 4 days      . lidocaine (LIDODERM) 5 % Place 1 patch onto the skin daily as needed  (for pain). Remove after 12 hours      . lisinopril (PRINIVIL,ZESTRIL) 5 MG tablet Take 1 tablet (5 mg total) by mouth daily.  30 tablet  6  . metoprolol succinate (TOPROL-XL) 100 MG 24 hr tablet Take 1 1/2 tab (150mg ) daily  135 tablet  3  . montelukast (SINGULAIR) 10 MG tablet Take 10 mg by mouth every morning.       . nitroGLYCERIN (NITROSTAT) 0.4 MG SL tablet Place 1 tablet (0.4 mg total) under the tongue every 5 (five) minutes x 3 doses as needed. If no relief after 3rd dose, proceed to ED  25 tablet  11  . pantoprazole (PROTONIX) 40 MG tablet Take 1 tablet (40 mg total) by mouth daily.  30 tablet  6  . predniSONE (DELTASONE) 10 MG tablet Take 10 mg by mouth daily with breakfast. Will be finished with medication in 5 days      . rOPINIRole (REQUIP) 0.5 MG tablet Take 1 mg by mouth at bedtime.       Marland Kitchen SALINE NASAL SPRAY NA Place 1-2 sprays into the nose as needed (for congestion).       . SPIRIVA HANDIHALER 18 MCG inhalation capsule Place 1 capsule into inhaler and inhale daily.      Marland Kitchen spironolactone (ALDACTONE) 25 MG tablet Take 12.5 mg by mouth daily.      . traMADol (ULTRAM) 50 MG tablet Take 2 tablets by mouth 2 (two) times daily.        No current facility-administered medications for this visit.     Past Surgical History  Procedure Laterality Date  . Orif ankle fracture Right 10/21/2009  . Tubal ligation  1989  . Coronary stent placement  02/2012  . Bi-ventricular implantable cardioverter defibrillator  (crt-d)  06/02/2013    MDT VivaQuad CRTD implanted by Dr Johney Frame for ICM, CHF  . Hysteroscopy w/ endometrial ablation  ~ 1998  . Coronary angioplasty with stent placement  2007; 2009; 2011    "?2 +1 +1 "  . Cardiac catheterization  12/2012    S/P MI     Allergies  Allergen Reactions  . Tape Rash and Other (See Comments)    Severe Reaction (latex tape): Burned hand, Redness-took days to clear up (03/02/12)  . Codeine Nausea And Vomiting  . Prozac [Fluoxetine Hcl] Other (See  Comments)    Mean/violent thoughts  . Fosamax [Alendronate Sodium] Nausea Only and Other (See  Comments)    Stomach pain  . Keflex [Cephalexin] Nausea Only  . Lipitor [Atorvastatin] Other (See Comments)    Leg pain      Family History  Problem Relation Age of Onset  . Breast cancer Mother   . Heart failure Brother   . Breast cancer Sister   . Breast cancer Sister   . Uterine cancer Sister      Social History Ms. Huron reports that she has been smoking Cigarettes.  She started smoking about 22 years ago. She has a 23 pack-year smoking history. She has never used smokeless tobacco. Ms. Loa reports that she drinks alcohol.   Review of Systems CONSTITUTIONAL: No weight loss, fever, chills, weakness or fatigue.  HEENT: Eyes: No visual loss, blurred vision, double vision or yellow sclerae.No hearing loss, sneezing, congestion, runny nose or sore throat.  SKIN: No rash or itching.  CARDIOVASCULAR: per HPI RESPIRATORY: No shortness of breath, cough or sputum.  GASTROINTESTINAL: No anorexia, nausea, vomiting or diarrhea. No abdominal pain or blood.  GENITOURINARY: No burning on urination, no polyuria NEUROLOGICAL: No headache, dizziness, syncope, paralysis, ataxia, numbness or tingling in the extremities. No change in bowel or bladder control.  MUSCULOSKELETAL: chronic knee pain LYMPHATICS: No enlarged nodes. No history of splenectomy.  PSYCHIATRIC: No history of depression or anxiety.  ENDOCRINOLOGIC: No reports of sweating, cold or heat intolerance. No polyuria or polydipsia.  Marland Kitchen   Physical Examination p 62 bp 136/84 Wt 191 lbs BMI 37 Gen: resting comfortably, no acute distress HEENT: no scleral icterus, pupils equal round and reactive, no palptable cervical adenopathy,  CV: RRR, no m/r/g, no JVD, no carotid bruits Resp: Clear to auscultation bilaterally GI: abdomen is soft, non-tender, non-distended, normal bowel sounds, no hepatosplenomegaly MSK: extremities are warm,  no edema.  Skin: warm, no rash Neuro:  no focal deficits Psych: appropriate affect   Diagnostic Studies  04/2010 Cath [place], [place]: LVEF 55%, mild anterior hypokinesis. LM normal, LAD with stents in prox and mid portion widely patent, LAD ostial 30%. Diags with luminal irregs. LCX with luminial irregs. RCA with distal patent stents  Stent cards  May 2008 DES to PDA, DES to LAD  Jan 2009 DES to prox LAD  Mar 05 2009 DES to LAD x2  05/2011 DES to RCA  Jan 2014 stent done [place], [place]  03/2010 Echo: LVEF 40%, hypokinesis of the anteroseptum.   01/02/13 Cath  Hemodynamic Findings:  Central aortic pressure: 147/79  Left ventricular pressure: 130/22/26  Angiographic Findings:  Left main: 30% distal stenosis. It appears that the distal left main has a stent that continues into the LAD.  Left Anterior Descending Artery: Large vessel that courses to the apex. The entire proximal and mid LAD is stented. There is mild stent restenosis in the proximal segment. The mid stented segment has diffuse 30% stent restenosis. The distal vessel becomes small in caliber and has mild diffuse plaque. There is a moderate caliber first diagonal Gennie Eisinger with mild plaque disease.  Circumflex Artery: Moderate caliber non-dominant vessel with three moderate caliber obtuse marginal branches. No obstructive disease.  Right Coronary Artery: Large dominant vessel with 30% proximal stenosis, heavily calcified mid vessel with patent stent in the mid vessel (no significant restenosis). The distal vessel has diffuse 40-50% stenosis. The posterolateral Arye Weyenberg and PDA are moderate caliber, patent vessels with mild plaque disease.  Left Ventricular Angiogram: LVEF=25-30%. Hypokinesis of the antero-apical wall.  Impression:  1. 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 Elevated troponin following syncopal event. No culprit lesions seen.  3. Moderate to severe LV systolic dysfunction.  Recommendations:  Continue medical management of CAD. Workup for syncope is underway. Carotid dopplers today. Will check d-dimer as PE is a possibility. She could also have had an arrythmia with LV dysfunction.  Complications: None. The patient tolerated the procedure well.  01/03/13 Echo  - LVEF 25-30%, akinesis of the anteroseptal and apical myocardium, grade I diastolic dysfunction,  01/03/13 Carotid US  - The vertebral arteries appear patent with antegrade flow. - Findings consistent with1- 39 percent stenosis,high end of scale,involving the right internal carotid artery. - Findings consistent with 40 - 59 percent stenosis, low end of scale,involving the left internal carotid artery. - ICA/CCA ratio. right =1.72. left = 1.52 Other specific details can be found in the table(s) above. Prepared and Electronically Authenticated by  12/13/12 Event monitor  No symptoms reported, sinus rhythm with conduction delay, occasional PVCs.  04/2013 Echo  Study Conclusions  - Left ventricle: The cavity size was normal. Wall thickness was increased in a pattern of mild LVH with moderate basal septal hypertrophy. Systolic function was moderately to severely reduced. The estimated ejection fraction was in the range of 30% to 35%. There is akinesis to dyskinesis of the mid-distal anteroseptal and apical myocardium. There is akinesis of the distalinferoseptal myocardium. Doppler parameters are consistent with abnormal left ventricular relaxation (grade 1 diastolic dysfunction). - Ventricular septum: Septal motion showed abnormal function and dyssynergy. - Aortic valve: Mildly calcified annulus. Probably trileaflet; mildly calcified leaflets. No significant regurgitation. - Mitral valve: Calcified annulus. Trivial regurgitation. - Left atrium: The atrium was mildly dilated. - Right atrium: Central venous pressure: 24mm Hg (est). - Tricuspid valve: Trivial regurgitation. - Pulmonary arteries: PA peak pressure: 61mm Hg  (S). - Pericardium, extracardiac: There was no pericardial effusion. Impressions:  - MildLVH with moderate basal septal hypertrophy, LVEF 30-35% with wall motion abnormalities as outlined, grade 1 diastolic dysfunction. Mild left atrial enlagement. MAC with trivial mitral regurgitation. MIldlysclerotic aortic valve. Trivial tricuspid regurgitation with normal PASP 20 mmHg.     Assessment and Plan   1. ICM/Chronic systolic heart failure  - LVEF 30-35%, known hx of CAD with last cath with patent stents.  . She is NYHA II, recent BiV AICD placed. She is euvolemic in clinic today  - some side effects to recent Toprol XL increase to 150mg  daily, fatigue is just now beginning to resolve. Will not further titrate at this time  2. OSA - continue CPAP  3. Hyperlipidemia - intolerant to statins, continue dietary and lifestyle modification      Antoine Poche, M.D., F.A.C.C.

## 2013-10-08 ENCOUNTER — Other Ambulatory Visit (HOSPITAL_COMMUNITY): Payer: Self-pay | Admitting: Cardiology

## 2013-10-14 ENCOUNTER — Encounter: Payer: Self-pay | Admitting: Internal Medicine

## 2013-10-14 ENCOUNTER — Ambulatory Visit (INDEPENDENT_AMBULATORY_CARE_PROVIDER_SITE_OTHER): Payer: 59 | Admitting: Internal Medicine

## 2013-10-14 VITALS — BP 117/79 | HR 64 | Ht 60.0 in | Wt 193.0 lb

## 2013-10-14 DIAGNOSIS — I2589 Other forms of chronic ischemic heart disease: Secondary | ICD-10-CM

## 2013-10-14 DIAGNOSIS — I509 Heart failure, unspecified: Secondary | ICD-10-CM

## 2013-10-14 DIAGNOSIS — I255 Ischemic cardiomyopathy: Secondary | ICD-10-CM

## 2013-10-14 DIAGNOSIS — I5022 Chronic systolic (congestive) heart failure: Secondary | ICD-10-CM

## 2013-10-14 LAB — MDC_IDC_ENUM_SESS_TYPE_INCLINIC
Battery Voltage: 3 V
Brady Statistic AP VS Percent: 0.49 %
Brady Statistic AS VP Percent: 71.3 %
Brady Statistic RA Percent Paced: 27.62 %
Brady Statistic RV Percent Paced: 7.01 %
Date Time Interrogation Session: 20150828104217
HIGH POWER IMPEDANCE MEASURED VALUE: 69 Ohm
HighPow Impedance: 190 Ohm
Lead Channel Impedance Value: 456 Ohm
Lead Channel Impedance Value: 456 Ohm
Lead Channel Pacing Threshold Amplitude: 0.75 V
Lead Channel Pacing Threshold Pulse Width: 0.4 ms
Lead Channel Pacing Threshold Pulse Width: 0.4 ms
Lead Channel Pacing Threshold Pulse Width: 1.5 ms
Lead Channel Sensing Intrinsic Amplitude: 4.125 mV
Lead Channel Sensing Intrinsic Amplitude: 8.375 mV
Lead Channel Setting Pacing Amplitude: 2 V
Lead Channel Setting Pacing Amplitude: 2.5 V
Lead Channel Setting Pacing Pulse Width: 0.4 ms
Lead Channel Setting Pacing Pulse Width: 1.5 ms
Lead Channel Setting Sensing Sensitivity: 0.3 mV
MDC IDC MSMT BATTERY REMAINING LONGEVITY: 60 mo
MDC IDC MSMT LEADCHNL LV PACING THRESHOLD AMPLITUDE: 2.5 V
MDC IDC MSMT LEADCHNL RA PACING THRESHOLD AMPLITUDE: 0.5 V
MDC IDC SET LEADCHNL LV PACING AMPLITUDE: 3.5 V
MDC IDC SET ZONE DETECTION INTERVAL: 300 ms
MDC IDC SET ZONE DETECTION INTERVAL: 350 ms
MDC IDC STAT BRADY AP VP PERCENT: 27.13 %
MDC IDC STAT BRADY AS VS PERCENT: 1.08 %
Zone Setting Detection Interval: 360 ms
Zone Setting Detection Interval: 360 ms

## 2013-10-14 NOTE — Progress Notes (Signed)
PCP: Bedelia Person, MD Primary Cardiologist:  Dr Crisoforo Oxford Becky [name] is a 63 y.o. female who presents today for electrophysiology followup.  Her LV lead threshold has increased.  She felt very well initially following CRT implant.  She says that she has less energy now.  She has also been treated for pneumonia. Today she denies CP, dizziness, presyncope, syncope or other issues.   Past Medical History  Diagnosis Date  . GERD (gastroesophageal reflux disease)   . Hypertension   . HLD (hyperlipidemia)   . CAD (coronary artery disease)     a. 5/08: s/p DES to PDA and DES to LAD; b. 1/09: s/p DES to pLAD; c. 1/11: s/p DES to LAD x 2, d. LHC (3/12 in [place] - EF 55%, mild ant HK, nl LM, LAD stents ok, oLAD 30, RCA stents ok;  e.  4/13:  s/p DES to RCA, f. 1/14: dLM 20-30, mOM3 20-30, mLAD 80 ISR => s/p 2.5x16 mm Promus Element DES; EF 45-50%   . LBBB (left bundle branch block)   . History of depression   . COPD (chronic obstructive pulmonary disease)   . Ischemic cardiomyopathy     a. Echo (11/14):  EF 25%   . Fibromyalgia   . Syncope 11/14    LifeVest placed  . CHF (congestive heart failure)   . Depression   . Heart murmur     "all my life"  . Myocardial infarction 12/2012  . Asthma   . Pneumonia     "when I was a child"  . Chronic bronchitis   . Daily headache     "might be from sinus" (06/02/2013)  . Arthritis     "all my joints; neck, back, legs, arms, elbows" (06/02/2013)  . Chronic lower back pain     "into pelvis region" (06/02/2013)  . Anxiety   . Automatic implantable cardioverter-defibrillator in situ    Past Surgical History  Procedure Laterality Date  . Orif ankle fracture Right 10/21/2009  . Tubal ligation  1989  . Coronary stent placement  02/2012  . Bi-ventricular implantable cardioverter defibrillator  (crt-d)  06/02/2013    MDT VivaQuad CRTD implanted by Dr Johney Frame for ICM, CHF  . Hysteroscopy w/ endometrial ablation  ~ 1998  . Coronary angioplasty with  stent placement  2007; 2009; 2011    "?2 +1 +1 "  . Cardiac catheterization  12/2012    S/P MI    Current Outpatient Prescriptions  Medication Sig Dispense Refill  . albuterol (PROAIR HFA) 108 (90 BASE) MCG/ACT inhaler Inhale 2 puffs into the lungs every 4 (four) hours as needed for shortness of breath.       Marland Kitchen aspirin 81 MG chewable tablet Chew 81 mg by mouth daily with breakfast.      . cetirizine (ZYRTEC) 10 MG tablet Take 10 mg by mouth daily.      . clopidogrel (PLAVIX) 75 MG tablet Take 75 mg by mouth daily.       Marland Kitchen escitalopram (LEXAPRO) 20 MG tablet Take 20 mg by mouth daily.       . Fluticasone-Salmeterol (ADVAIR) 500-50 MCG/DOSE AEPB Inhale 1 puff into the lungs 2 (two) times daily.      . furosemide (LASIX) 20 MG tablet Take 20 mg by mouth daily.      Marland Kitchen HYDROcodone-acetaminophen (NORCO/VICODIN) 5-325 MG per tablet Take 1 tablet by mouth every 4 (four) hours as needed (for pain).       Marland Kitchen lactulose (CHRONULAC)  10 GM/15ML solution Take 10-20 g by mouth 2 (two) times daily as needed (for constipation).       Marland Kitchen lidocaine (LIDODERM) 5 % Place 1 patch onto the skin daily as needed (for pain). Remove after 12 hours      . lisinopril (PRINIVIL,ZESTRIL) 5 MG tablet Take 1 tablet (5 mg total) by mouth daily.  30 tablet  6  . metoprolol succinate (TOPROL-XL) 100 MG 24 hr tablet Take 1 1/2 tab (150mg ) daily  135 tablet  3  . montelukast (SINGULAIR) 10 MG tablet Take 10 mg by mouth every morning.       . nitroGLYCERIN (NITROSTAT) 0.4 MG SL tablet Place 1 tablet (0.4 mg total) under the tongue every 5 (five) minutes x 3 doses as needed. If no relief after 3rd dose, proceed to ED  25 tablet  11  . pantoprazole (PROTONIX) 40 MG tablet Take 1 tablet (40 mg total) by mouth daily.  30 tablet  6  . rOPINIRole (REQUIP) 0.5 MG tablet Take 1 mg by mouth at bedtime.       Marland Kitchen SALINE NASAL SPRAY NA Place 1-2 sprays into the nose as needed (for congestion).       . SPIRIVA HANDIHALER 18 MCG inhalation capsule  Place 1 capsule into inhaler and inhale daily.      Marland Kitchen spironolactone (ALDACTONE) 25 MG tablet Take 12.5 mg by mouth daily.      . traMADol (ULTRAM) 50 MG tablet Take 2 tablets by mouth 2 (two) times daily.        No current facility-administered medications for this visit.    Physical Exam: Filed Vitals:   10/14/13 0856  BP: 117/79  Pulse: 64  Height: 5' (1.524 m)  Weight: 193 lb (87.544 kg)    GEN- The patient is well appearing, alert and oriented x 3 today.   Head- normocephalic, atraumatic Eyes-  Sclera clear, conjunctiva pink Ears- hearing intact Oropharynx- clear Lungs- Clear to ausculation bilaterally, normal work of breathing Chest- ICD pocket is well healed Heart- Regular rate and rhythm, no murmurs, rubs or gallops, PMI not laterally displaced GI- soft, NT, ND, + BS Extremities- no clubbing, cyanosis, or edema  ICD interrogation- reviewed in detail today,  See PACEART report  Assessment and Plan:  1.  Chronic systolic dysfunction/ Ischemic CM euvolemic today Stable on an appropriate medical regimen I have reviewed her CXR which reveals that her LV lead is pulled back.  Given very high pacing threshold, I have offered lead revision. Risks, benefits, and alternatives to CRT-D system revision were discussed in detail with the patient today. The patient  understands that the risks include but are not limited to bleeding, infection, pneumothorax, perforation, tamponade, vascular damage, renal failure, MI, stroke, death, inappropriate shocks, and lead dislodgement and wishes to proceed.  We will therefore schedule device implantation at the next available time.

## 2013-10-14 NOTE — Patient Instructions (Signed)
   You will be contacted by Dr. Jenel Lucks office to schedule a lead revision. Your physician recommends that you continue on your current medications as directed. Please refer to the Current Medication list given to you today.

## 2013-10-18 ENCOUNTER — Telehealth: Payer: Self-pay | Admitting: Internal Medicine

## 2013-10-18 NOTE — Telephone Encounter (Signed)
New problem   Pt is waiting on a call from nurse to sched a procedure to perform Lead Revision. Please call pt.

## 2013-10-20 ENCOUNTER — Encounter: Payer: Self-pay | Admitting: Internal Medicine

## 2013-10-21 ENCOUNTER — Other Ambulatory Visit: Payer: Self-pay | Admitting: *Deleted

## 2013-10-21 DIAGNOSIS — T82110A Breakdown (mechanical) of cardiac electrode, initial encounter: Secondary | ICD-10-CM

## 2013-10-21 NOTE — Telephone Encounter (Signed)
Spoke with patient and she is aware of her procedure on 11/08/13.  NPO after midnight the pm prior to procedure.  Be at the hospital at 8:30am  [facility]

## 2013-11-07 ENCOUNTER — Encounter (HOSPITAL_COMMUNITY): Payer: Self-pay | Admitting: Pharmacy Technician

## 2013-11-07 MED ORDER — VANCOMYCIN HCL IN DEXTROSE 1-5 GM/200ML-% IV SOLN
1000.0000 mg | INTRAVENOUS | Status: DC
  Filled 2013-11-07: qty 200

## 2013-11-07 MED ORDER — SODIUM CHLORIDE 0.9 % IR SOLN
80.0000 mg | Status: DC
  Filled 2013-11-07: qty 2

## 2013-11-08 ENCOUNTER — Encounter (HOSPITAL_COMMUNITY): Payer: Self-pay | Admitting: *Deleted

## 2013-11-08 ENCOUNTER — Ambulatory Visit (HOSPITAL_COMMUNITY)
Admission: RE | Admit: 2013-11-08 | Discharge: 2013-11-09 | Disposition: A | Discharge status: Home / Self Care | Payer: PRIVATE HEALTH INSURANCE | Source: Ambulatory Visit | Attending: Internal Medicine | Admitting: Internal Medicine

## 2013-11-08 ENCOUNTER — Encounter (HOSPITAL_COMMUNITY)
Admission: RE | Disposition: A | Discharge status: Home / Self Care | Payer: Self-pay | Source: Ambulatory Visit | Attending: Internal Medicine

## 2013-11-08 DIAGNOSIS — I447 Left bundle-branch block, unspecified: Secondary | ICD-10-CM | POA: Insufficient documentation

## 2013-11-08 DIAGNOSIS — E785 Hyperlipidemia, unspecified: Secondary | ICD-10-CM | POA: Diagnosis not present

## 2013-11-08 DIAGNOSIS — I2589 Other forms of chronic ischemic heart disease: Secondary | ICD-10-CM | POA: Insufficient documentation

## 2013-11-08 DIAGNOSIS — K219 Gastro-esophageal reflux disease without esophagitis: Secondary | ICD-10-CM | POA: Insufficient documentation

## 2013-11-08 DIAGNOSIS — I5042 Chronic combined systolic (congestive) and diastolic (congestive) heart failure: Secondary | ICD-10-CM | POA: Diagnosis present

## 2013-11-08 DIAGNOSIS — T82110A Breakdown (mechanical) of cardiac electrode, initial encounter: Secondary | ICD-10-CM

## 2013-11-08 DIAGNOSIS — F3289 Other specified depressive episodes: Secondary | ICD-10-CM | POA: Diagnosis not present

## 2013-11-08 DIAGNOSIS — I5022 Chronic systolic (congestive) heart failure: Secondary | ICD-10-CM | POA: Diagnosis not present

## 2013-11-08 DIAGNOSIS — Z7902 Long term (current) use of antithrombotics/antiplatelets: Secondary | ICD-10-CM | POA: Insufficient documentation

## 2013-11-08 DIAGNOSIS — I509 Heart failure, unspecified: Secondary | ICD-10-CM | POA: Insufficient documentation

## 2013-11-08 DIAGNOSIS — Z9861 Coronary angioplasty status: Secondary | ICD-10-CM | POA: Insufficient documentation

## 2013-11-08 DIAGNOSIS — Y831 Surgical operation with implant of artificial internal device as the cause of abnormal reaction of the patient, or of later complication, without mention of misadventure at the time of the procedure: Secondary | ICD-10-CM | POA: Insufficient documentation

## 2013-11-08 DIAGNOSIS — Z7982 Long term (current) use of aspirin: Secondary | ICD-10-CM | POA: Insufficient documentation

## 2013-11-08 DIAGNOSIS — I255 Ischemic cardiomyopathy: Secondary | ICD-10-CM | POA: Diagnosis present

## 2013-11-08 DIAGNOSIS — I2510 Atherosclerotic heart disease of native coronary artery without angina pectoris: Secondary | ICD-10-CM | POA: Insufficient documentation

## 2013-11-08 DIAGNOSIS — I252 Old myocardial infarction: Secondary | ICD-10-CM | POA: Insufficient documentation

## 2013-11-08 DIAGNOSIS — T82198A Other mechanical complication of other cardiac electronic device, initial encounter: Secondary | ICD-10-CM | POA: Diagnosis present

## 2013-11-08 DIAGNOSIS — I10 Essential (primary) hypertension: Secondary | ICD-10-CM | POA: Insufficient documentation

## 2013-11-08 DIAGNOSIS — F329 Major depressive disorder, single episode, unspecified: Secondary | ICD-10-CM | POA: Insufficient documentation

## 2013-11-08 DIAGNOSIS — J449 Chronic obstructive pulmonary disease, unspecified: Secondary | ICD-10-CM | POA: Insufficient documentation

## 2013-11-08 DIAGNOSIS — J4489 Other specified chronic obstructive pulmonary disease: Secondary | ICD-10-CM | POA: Insufficient documentation

## 2013-11-08 DIAGNOSIS — I519 Heart disease, unspecified: Secondary | ICD-10-CM | POA: Diagnosis present

## 2013-11-08 HISTORY — PX: LEAD REVISION: SHX5945

## 2013-11-08 LAB — SURGICAL PCR SCREEN
MRSA, PCR: NEGATIVE
STAPHYLOCOCCUS AUREUS: NEGATIVE

## 2013-11-08 LAB — CBC
HEMATOCRIT: 41.8 % (ref 36.0–46.0)
Hemoglobin: 14.2 g/dL (ref 12.0–15.0)
MCH: 31.1 pg (ref 26.0–34.0)
MCHC: 34 g/dL (ref 30.0–36.0)
MCV: 91.5 fL (ref 78.0–100.0)
PLATELETS: 217 10*3/uL (ref 150–400)
RBC: 4.57 MIL/uL (ref 3.87–5.11)
RDW: 13.1 % (ref 11.5–15.5)
WBC: 5.8 10*3/uL (ref 4.0–10.5)

## 2013-11-08 LAB — BASIC METABOLIC PANEL
Anion gap: 10 (ref 5–15)
BUN: 21 mg/dL (ref 6–23)
CO2: 26 meq/L (ref 19–32)
Calcium: 10.4 mg/dL (ref 8.4–10.5)
Chloride: 102 mEq/L (ref 96–112)
Creatinine, Ser: 0.92 mg/dL (ref 0.50–1.10)
GFR calc Af Amer: 75 mL/min — ABNORMAL LOW (ref >90)
GFR, EST NON AFRICAN AMERICAN: 65 mL/min — AB (ref >90)
GLUCOSE: 102 mg/dL — AB (ref 70–99)
POTASSIUM: 3.7 meq/L (ref 3.7–5.3)
SODIUM: 138 meq/L (ref 137–147)

## 2013-11-08 LAB — PROTIME-INR
INR: 0.92 (ref 0.00–1.49)
Prothrombin Time: 12.4 seconds (ref 11.6–15.2)

## 2013-11-08 SURGERY — LEAD REVISION
Anesthesia: LOCAL | Laterality: Left

## 2013-11-08 MED ORDER — LIDOCAINE HCL (PF) 1 % IJ SOLN
INTRAMUSCULAR | Status: AC
  Filled 2013-11-08: qty 30

## 2013-11-08 MED ORDER — FUROSEMIDE 20 MG PO TABS
20.0000 mg | ORAL_TABLET | Freq: Every day | ORAL | Status: DC
  Administered 2013-11-08 – 2013-11-09 (×2): 20 mg via ORAL
  Filled 2013-11-08 (×2): qty 1

## 2013-11-08 MED ORDER — HEPARIN (PORCINE) IN NACL 2-0.9 UNIT/ML-% IJ SOLN
INTRAMUSCULAR | Status: AC
  Filled 2013-11-08: qty 500

## 2013-11-08 MED ORDER — ROPINIROLE HCL 1 MG PO TABS
1.0000 mg | ORAL_TABLET | Freq: Every day | ORAL | Status: DC
  Administered 2013-11-08: 1 mg via ORAL
  Filled 2013-11-08 (×3): qty 1

## 2013-11-08 MED ORDER — SODIUM CHLORIDE 0.9 % IJ SOLN: 3.0000 mL | Freq: Two times a day (BID) | INTRAMUSCULAR | Status: DC

## 2013-11-08 MED ORDER — SODIUM CHLORIDE 0.9 % IV SOLN: 250.0000 mL | INTRAVENOUS | Status: DC | PRN

## 2013-11-08 MED ORDER — TRAMADOL HCL 50 MG PO TABS
100.0000 mg | ORAL_TABLET | Freq: Two times a day (BID) | ORAL | Status: DC
  Administered 2013-11-08 – 2013-11-09 (×2): 100 mg via ORAL
  Filled 2013-11-08 (×3): qty 2

## 2013-11-08 MED ORDER — ACETAMINOPHEN 325 MG PO TABS: 325.0000 mg | ORAL_TABLET | ORAL | Status: DC | PRN

## 2013-11-08 MED ORDER — ALBUTEROL SULFATE (2.5 MG/3ML) 0.083% IN NEBU: 3.0000 mL | INHALATION_SOLUTION | RESPIRATORY_TRACT | Status: DC | PRN

## 2013-11-08 MED ORDER — LISINOPRIL 5 MG PO TABS
5.0000 mg | ORAL_TABLET | Freq: Every day | ORAL | Status: DC
  Administered 2013-11-08 – 2013-11-09 (×2): 5 mg via ORAL
  Filled 2013-11-08 (×2): qty 1

## 2013-11-08 MED ORDER — MIDAZOLAM HCL 5 MG/5ML IJ SOLN
INTRAMUSCULAR | Status: AC
  Filled 2013-11-08: qty 5

## 2013-11-08 MED ORDER — CLOPIDOGREL BISULFATE 75 MG PO TABS
75.0000 mg | ORAL_TABLET | Freq: Every day | ORAL | Status: DC
  Administered 2013-11-09: 09:00:00 75 mg via ORAL
  Filled 2013-11-08: qty 1

## 2013-11-08 MED ORDER — MOMETASONE FURO-FORMOTEROL FUM 200-5 MCG/ACT IN AERO
2.0000 | INHALATION_SPRAY | Freq: Two times a day (BID) | RESPIRATORY_TRACT | Status: DC
  Administered 2013-11-08 – 2013-11-09 (×2): 2 via RESPIRATORY_TRACT
  Filled 2013-11-08: qty 8.8

## 2013-11-08 MED ORDER — IPRATROPIUM-ALBUTEROL 0.5-2.5 (3) MG/3ML IN SOLN: 3.0000 mL | Freq: Four times a day (QID) | RESPIRATORY_TRACT | Status: DC | PRN

## 2013-11-08 MED ORDER — MUPIROCIN 2 % EX OINT
TOPICAL_OINTMENT | CUTANEOUS | Status: AC
  Administered 2013-11-08: 1 via TOPICAL
  Filled 2013-11-08: qty 22

## 2013-11-08 MED ORDER — ASPIRIN 81 MG PO CHEW
81.0000 mg | CHEWABLE_TABLET | Freq: Every day | ORAL | Status: DC
  Administered 2013-11-09: 81 mg via ORAL
  Filled 2013-11-08 (×2): qty 1

## 2013-11-08 MED ORDER — MONTELUKAST SODIUM 10 MG PO TABS
10.0000 mg | ORAL_TABLET | Freq: Every day | ORAL | Status: DC
  Administered 2013-11-08: 21:00:00 10 mg via ORAL
  Filled 2013-11-08 (×2): qty 1

## 2013-11-08 MED ORDER — MUPIROCIN 2 % EX OINT
1.0000 "application " | TOPICAL_OINTMENT | Freq: Once | CUTANEOUS | Status: AC
  Administered 2013-11-08: 1 via TOPICAL
  Filled 2013-11-08: qty 22

## 2013-11-08 MED ORDER — CHLORHEXIDINE GLUCONATE 4 % EX LIQD
60.0000 mL | Freq: Once | CUTANEOUS | Status: DC
  Filled 2013-11-08: qty 60

## 2013-11-08 MED ORDER — VANCOMYCIN HCL IN DEXTROSE 1-5 GM/200ML-% IV SOLN
1000.0000 mg | Freq: Two times a day (BID) | INTRAVENOUS | Status: AC
  Administered 2013-11-08: 23:00:00 1000 mg via INTRAVENOUS
  Filled 2013-11-08: qty 200

## 2013-11-08 MED ORDER — TIOTROPIUM BROMIDE MONOHYDRATE 18 MCG IN CAPS
1.0000 | ORAL_CAPSULE | Freq: Every day | RESPIRATORY_TRACT | Status: DC
  Administered 2013-11-08 – 2013-11-09 (×2): 18 ug via RESPIRATORY_TRACT
  Filled 2013-11-08: qty 5

## 2013-11-08 MED ORDER — SODIUM CHLORIDE 0.9 % IV SOLN
INTRAVENOUS | Status: DC
  Administered 2013-11-08: 10:00:00 via INTRAVENOUS

## 2013-11-08 MED ORDER — SODIUM CHLORIDE 0.9 % IJ SOLN: 3.0000 mL | INTRAMUSCULAR | Status: DC | PRN

## 2013-11-08 MED ORDER — MUPIROCIN 2 % EX OINT: 1.0000 "application " | TOPICAL_OINTMENT | Freq: Once | CUTANEOUS | Status: DC

## 2013-11-08 MED ORDER — SPIRONOLACTONE 12.5 MG HALF TABLET
12.5000 mg | ORAL_TABLET | Freq: Every day | ORAL | Status: DC
  Administered 2013-11-08 – 2013-11-09 (×2): 12.5 mg via ORAL
  Filled 2013-11-08 (×2): qty 1

## 2013-11-08 MED ORDER — FENTANYL CITRATE 0.05 MG/ML IJ SOLN
INTRAMUSCULAR | Status: AC
  Filled 2013-11-08: qty 2

## 2013-11-08 MED ORDER — ESCITALOPRAM OXALATE 20 MG PO TABS
20.0000 mg | ORAL_TABLET | Freq: Every day | ORAL | Status: DC
  Administered 2013-11-08 – 2013-11-09 (×2): 20 mg via ORAL
  Filled 2013-11-08 (×2): qty 1

## 2013-11-08 MED ORDER — ONDANSETRON HCL 4 MG/2ML IJ SOLN: 4.0000 mg | Freq: Four times a day (QID) | INTRAMUSCULAR | Status: DC | PRN

## 2013-11-08 MED ORDER — PANTOPRAZOLE SODIUM 40 MG PO TBEC
40.0000 mg | DELAYED_RELEASE_TABLET | Freq: Every day | ORAL | Status: DC
  Administered 2013-11-08 – 2013-11-09 (×2): 40 mg via ORAL
  Filled 2013-11-08 (×2): qty 1

## 2013-11-08 MED ORDER — HYDROCODONE-ACETAMINOPHEN 5-325 MG PO TABS
1.0000 | ORAL_TABLET | ORAL | Status: DC | PRN
  Administered 2013-11-08 – 2013-11-09 (×5): 1 via ORAL
  Filled 2013-11-08 (×5): qty 1

## 2013-11-08 NOTE — Discharge Summary (Signed)
ELECTROPHYSIOLOGY PROCEDURE DISCHARGE SUMMARY    Patient ID: Becky [name],  MRN: [id number], DOB/AGE: 1950-08-01 63 y.o.  Admit date: 11/08/2013 Discharge date: 11/09/2013  Primary Care Physician: Bedelia Person, MD Primary Cardiologist: Branch Electrophysiologist: Jahne Krukowski  Primary Discharge Diagnosis:  Dislodged right atrial and left ventricular lead status post RA and LV lead revision this admission  Secondary Discharge Diagnosis:  1.  Ischemic cardiomyopathy 2.  Chronic congestive heart failure, class III 3.  Hypertension 4.  CAD - a. 5/08: s/p DES to PDA and DES to LAD; b. 1/09: s/p DES to pLAD; c. 1/11: s/p DES to LAD x 2, d. LHC (3/12 in [place] - EF 55%, mild ant HK, nl LM, LAD stents ok, oLAD 30, RCA stents ok; e. 4/13: s/p DES to RCA, f. 1/14: dLM 20-30, mOM3 20-30, mLAD 80 ISR => s/p 2.5x16 mm Promus Element DES; EF 45-50% 5.  LBBB   Allergies  Allergen Reactions  . Tape Rash and Other (See Comments)    Severe Reaction (latex tape): Burned hand, Redness-took days to clear up (03/02/12)  . Codeine Nausea And Vomiting  . Prozac [Fluoxetine Hcl] Other (See Comments)    Mean/violent thoughts  . Fosamax [Alendronate Sodium] Nausea Only and Other (See Comments)    Stomach pain  . Keflex [Cephalexin] Nausea Only  . Lipitor [Atorvastatin] Other (See Comments)    Leg pain     Procedures This Admission:  1.  Revision of right atrial and left ventricular lead revision on 11-08-13 by Dr Johney Frame.  The previously implanted RA and LV leads were removed and a new MDT 5076 RA lead and a new MDT model 4598 LV lead were implanted.  The previously implanted RV lead and ICD generator were used. There were no early apparent complicaitons 2.  CXR on 11-08-13 demonstrated no pneumothorax status post device implantation.   Brief HPI: Becky [name] is a 63 y.o. female with a past medical history as outlined above.  She underwent CRTD implant earlier this year with improvement in  HF symptoms.  On routine follow up she was found to have dislodgement of LV lead.  Risks, benefits, and alternatives to lead revision were reviewed with the patient who wished to proceed.   Hospital Course:  The patient was admitted and underwent implantation of a new RA and LV lead with details as outlined above.   She was monitored on telemetry overnight which demonstrated sinus rhythm with BiV pacing.  Left chest was without hematoma or ecchymosis.  The device was interrogated and found to be functioning normally.  CXR was obtained and demonstrated no pneumothorax status post device implantation.  Wound care, arm mobility, and restrictions were reviewed with the patient.  Dr Johney Frame examined the patient and considered them stable for discharge to home.   The patient's discharge medications include an ACE-I (Lisinopril) and beta blocker (Metoprolol).   Discharge Vitals: Blood pressure 136/63, pulse 64, temperature 97.8 F (36.6 C), temperature source Oral, resp. rate 20, height 5\' 1"  (1.549 m), weight 194 lb 0.1 oz (88 kg), SpO2 96.00%.  Physical Exam: Filed Vitals:   11/08/13 1943 11/08/13 2012 11/09/13 0015 11/09/13 0352  BP: 136/59  141/60 136/63  Pulse: 68  72 64  Temp: 98 F (36.7 C)  97.8 F (36.6 C) 97.8 F (36.6 C)  TempSrc: Oral  Oral Oral  Resp: 16  18 20   Height:      Weight:   194 lb 0.1 oz (88 kg)  SpO2: 97% 98% 96% 96%    GEN- The patient is well appearing, alert and oriented x 3 today.   Head- normocephalic, atraumatic Eyes-  Sclera clear, conjunctiva pink Ears- hearing intact Oropharynx- clear Neck- supple, Lungs- Clear to ausculation bilaterally, normal work of breathing Heart- Regular rate and rhythm, no murmurs, rubs or gallops, PMI not laterally displaced GI- soft, NT, ND, + BS Extremities- no clubbing, cyanosis, or edema  MS- no significant deformity or atrophy Neuro- strength and sensation are intact   Labs:   Lab Results  Component Value Date   WBC  5.8 11/08/2013   HGB 14.2 11/08/2013   HCT 41.8 11/08/2013   MCV 91.5 11/08/2013   PLT 217 11/08/2013     Recent Labs Lab 11/09/13 0322  NA 140  K 3.9  CL 104  CO2 26  BUN 15  CREATININE 0.71  CALCIUM 9.7  GLUCOSE 132*    Discharge Medications:    Medication List         aspirin 81 MG chewable tablet  Chew 81 mg by mouth daily with breakfast.     CALCIUM 1200 PO  Take 1,200 mg by mouth daily.     cetirizine 10 MG tablet  Commonly known as:  ZYRTEC  Take 10 mg by mouth daily.     clopidogrel 75 MG tablet  Commonly known as:  PLAVIX  Take 75 mg by mouth daily.     escitalopram 20 MG tablet  Commonly known as:  LEXAPRO  Take 20 mg by mouth daily.     Fluticasone-Salmeterol 500-50 MCG/DOSE Aepb  Commonly known as:  ADVAIR  Inhale 1 puff into the lungs 2 (two) times daily.     furosemide 20 MG tablet  Commonly known as:  LASIX  Take 20 mg by mouth daily.     HYDROcodone-acetaminophen 5-325 MG per tablet  Commonly known as:  NORCO/VICODIN  Take 1 tablet by mouth every 4 (four) hours as needed (for pain).     ipratropium-albuterol 0.5-2.5 (3) MG/3ML Soln  Commonly known as:  DUONEB  Take 3 mLs by nebulization every 6 (six) hours as needed (shortness of breath).     lactulose 10 GM/15ML solution  Commonly known as:  CHRONULAC  Take 10-20 g by mouth 2 (two) times daily as needed (for constipation).     lidocaine 5 %  Commonly known as:  LIDODERM  Place 1 patch onto the skin daily as needed (for pain). Remove after 12 hours     lisinopril 5 MG tablet  Commonly known as:  PRINIVIL,ZESTRIL  Take 1 tablet (5 mg total) by mouth daily.     metoprolol succinate 100 MG 24 hr tablet  Commonly known as:  TOPROL-XL  Take 1 1/2 tab ( ) daily     montelukast 10 MG tablet  Commonly known as:  SINGULAIR  Take 10 mg by mouth every morning.     nitroGLYCERIN 0.4 MG SL tablet  Commonly known as:  NITROSTAT  Place 1 tablet (0.4 mg total) under the tongue every 5  (five) minutes x 3 doses as needed. If no relief after 3rd dose, proceed to ED     pantoprazole 40 MG tablet  Commonly known as:  PROTONIX  Take 1 tablet (40 mg total) by mouth daily.     PROAIR HFA 108 (90 BASE) MCG/ACT inhaler  Generic drug:  albuterol  Inhale 2 puffs into the lungs every 4 (four) hours as needed for shortness of breath.  rOPINIRole 0.5 MG tablet  Commonly known as:  REQUIP  Take 1 mg by mouth at bedtime.     SALINE NASAL SPRAY NA  Place 1-2 sprays into the nose as needed (for congestion).     SPIRIVA HANDIHALER 18 MCG inhalation capsule  Generic drug:  tiotropium  Place 1 capsule into inhaler and inhale daily.     spironolactone 25 MG tablet  Commonly known as:  ALDACTONE  Take 12.5 mg by mouth daily.     traMADol 50 MG tablet  Commonly known as:  ULTRAM  Take 100 mg by mouth 2 (two) times daily.     Vitamin D 2000 UNITS Caps  Take 2,000 Units by mouth daily.        Disposition:   Follow-up Information   Follow up with [facility] Device 1 On 11/17/2013. (4pm)       Duration of Discharge Encounter: Greater than 30 minutes including physician time.  Signed,  Hillis Range MD

## 2013-11-08 NOTE — Brief Op Note (Signed)
11/08/2013  1:13 PM  PATIENT:  Becky [name]  63 y.o. female  PRE-OPERATIVE DIAGNOSIS:  Dislodged LV lead  POST-OPERATIVE DIAGNOSIS:  Dislodged RA and LV lead  PROCEDURE:  Procedure(s): LEAD REVISION (Left)  SURGEON:  Surgeon(s) and Role:    * Hillis Range, MD - Primary  ANESTHESIA:   IV sedation  EBL:     BLOOD ADMINISTERED:none  DRAINS: none   LOCAL MEDICATIONS USED:  LIDOCAINE   SPECIMEN:  No Specimen  DISPOSITION OF SPECIMEN:  N/A  COUNTS:  YES  TOURNIQUET:  * No tourniquets in log *  DICTATION: .Other Dictation: Dictation Number [id number]  PLAN OF CARE: Admit for overnight observation  PATIENT DISPOSITION:  PACU - hemodynamically stable.   Delay start of Pharmacological VTE agent (>24hrs) due to surgical blood loss or risk of bleeding: yes

## 2013-11-08 NOTE — Interval H&P Note (Signed)
History and Physical Interval Note:  11/08/2013 10:05 AM  Becky [name]  has presented today for surgery, with the diagnosis of EOL  The various methods of treatment have been discussed with the patient and family. After consideration of risks, benefits and other options for treatment, the patient has consented to  Procedure(s): LEAD REVISION (Left) as a surgical intervention .  The patient's history has been reviewed, patient examined, no change in status, stable for surgery.  I have reviewed the patient's chart and labs.  Questions were answered to the patient's satisfaction.     Hillis Range

## 2013-11-08 NOTE — H&P (View-Only) (Signed)
PCP: Bedelia Person, MD Primary Cardiologist:  Dr Crisoforo Oxford Becky [name] is a 63 y.o. female who presents today for electrophysiology followup.  Her LV lead threshold has increased.  She felt very well initially following CRT implant.  She says that she has less energy now.  She has also been treated for pneumonia. Today she denies CP, dizziness, presyncope, syncope or other issues.   Past Medical History  Diagnosis Date  . GERD (gastroesophageal reflux disease)   . Hypertension   . HLD (hyperlipidemia)   . CAD (coronary artery disease)     a. 5/08: s/p DES to PDA and DES to LAD; b. 1/09: s/p DES to pLAD; c. 1/11: s/p DES to LAD x 2, d. LHC (3/12 in [place] - EF 55%, mild ant HK, nl LM, LAD stents ok, oLAD 30, RCA stents ok;  e.  4/13:  s/p DES to RCA, f. 1/14: dLM 20-30, mOM3 20-30, mLAD 80 ISR => s/p 2.5x16 mm Promus Element DES; EF 45-50%   . LBBB (left bundle branch block)   . History of depression   . COPD (chronic obstructive pulmonary disease)   . Ischemic cardiomyopathy     a. Echo (11/14):  EF 25%   . Fibromyalgia   . Syncope 11/14    LifeVest placed  . CHF (congestive heart failure)   . Depression   . Heart murmur     "all my life"  . Myocardial infarction 12/2012  . Asthma   . Pneumonia     "when I was a child"  . Chronic bronchitis   . Daily headache     "might be from sinus" (06/02/2013)  . Arthritis     "all my joints; neck, back, legs, arms, elbows" (06/02/2013)  . Chronic lower back pain     "into pelvis region" (06/02/2013)  . Anxiety   . Automatic implantable cardioverter-defibrillator in situ    Past Surgical History  Procedure Laterality Date  . Orif ankle fracture Right 10/21/2009  . Tubal ligation  1989  . Coronary stent placement  02/2012  . Bi-ventricular implantable cardioverter defibrillator  (crt-d)  06/02/2013    MDT VivaQuad CRTD implanted by Dr Johney Frame for ICM, CHF  . Hysteroscopy w/ endometrial ablation  ~ 1998  . Coronary angioplasty with  stent placement  2007; 2009; 2011    "?2 +1 +1 "  . Cardiac catheterization  12/2012    S/P MI    Current Outpatient Prescriptions  Medication Sig Dispense Refill  . albuterol (PROAIR HFA) 108 (90 BASE) MCG/ACT inhaler Inhale 2 puffs into the lungs every 4 (four) hours as needed for shortness of breath.       Marland Kitchen aspirin 81 MG chewable tablet Chew 81 mg by mouth daily with breakfast.      . cetirizine (ZYRTEC) 10 MG tablet Take 10 mg by mouth daily.      . clopidogrel (PLAVIX) 75 MG tablet Take 75 mg by mouth daily.       Marland Kitchen escitalopram (LEXAPRO) 20 MG tablet Take 20 mg by mouth daily.       . Fluticasone-Salmeterol (ADVAIR) 500-50 MCG/DOSE AEPB Inhale 1 puff into the lungs 2 (two) times daily.      . furosemide (LASIX) 20 MG tablet Take 20 mg by mouth daily.      Marland Kitchen HYDROcodone-acetaminophen (NORCO/VICODIN) 5-325 MG per tablet Take 1 tablet by mouth every 4 (four) hours as needed (for pain).       Marland Kitchen lactulose (CHRONULAC)  10 GM/15ML solution Take 10-20 g by mouth 2 (two) times daily as needed (for constipation).       Marland Kitchen lidocaine (LIDODERM) 5 % Place 1 patch onto the skin daily as needed (for pain). Remove after 12 hours      . lisinopril (PRINIVIL,ZESTRIL) 5 MG tablet Take 1 tablet (5 mg total) by mouth daily.  30 tablet  6  . metoprolol succinate (TOPROL-XL) 100 MG 24 hr tablet Take 1 1/2 tab (150mg ) daily  135 tablet  3  . montelukast (SINGULAIR) 10 MG tablet Take 10 mg by mouth every morning.       . nitroGLYCERIN (NITROSTAT) 0.4 MG SL tablet Place 1 tablet (0.4 mg total) under the tongue every 5 (five) minutes x 3 doses as needed. If no relief after 3rd dose, proceed to ED  25 tablet  11  . pantoprazole (PROTONIX) 40 MG tablet Take 1 tablet (40 mg total) by mouth daily.  30 tablet  6  . rOPINIRole (REQUIP) 0.5 MG tablet Take 1 mg by mouth at bedtime.       Marland Kitchen SALINE NASAL SPRAY NA Place 1-2 sprays into the nose as needed (for congestion).       . SPIRIVA HANDIHALER 18 MCG inhalation capsule  Place 1 capsule into inhaler and inhale daily.      Marland Kitchen spironolactone (ALDACTONE) 25 MG tablet Take 12.5 mg by mouth daily.      . traMADol (ULTRAM) 50 MG tablet Take 2 tablets by mouth 2 (two) times daily.        No current facility-administered medications for this visit.    Physical Exam: Filed Vitals:   10/14/13 0856  BP: 117/79  Pulse: 64  Height: 5' (1.524 m)  Weight: 193 lb (87.544 kg)    GEN- The patient is well appearing, alert and oriented x 3 today.   Head- normocephalic, atraumatic Eyes-  Sclera clear, conjunctiva pink Ears- hearing intact Oropharynx- clear Lungs- Clear to ausculation bilaterally, normal work of breathing Chest- ICD pocket is well healed Heart- Regular rate and rhythm, no murmurs, rubs or gallops, PMI not laterally displaced GI- soft, NT, ND, + BS Extremities- no clubbing, cyanosis, or edema  ICD interrogation- reviewed in detail today,  See PACEART report  Assessment and Plan:  1.  Chronic systolic dysfunction/ Ischemic CM euvolemic today Stable on an appropriate medical regimen I have reviewed her CXR which reveals that her LV lead is pulled back.  Given very high pacing threshold, I have offered lead revision. Risks, benefits, and alternatives to CRT-D system revision were discussed in detail with the patient today. The patient  understands that the risks include but are not limited to bleeding, infection, pneumothorax, perforation, tamponade, vascular damage, renal failure, MI, stroke, death, inappropriate shocks, and lead dislodgement and wishes to proceed.  We will therefore schedule device implantation at the next available time.

## 2013-11-09 ENCOUNTER — Ambulatory Visit (HOSPITAL_COMMUNITY): Payer: PRIVATE HEALTH INSURANCE

## 2013-11-09 DIAGNOSIS — T82198A Other mechanical complication of other cardiac electronic device, initial encounter: Secondary | ICD-10-CM | POA: Diagnosis not present

## 2013-11-09 DIAGNOSIS — I509 Heart failure, unspecified: Secondary | ICD-10-CM | POA: Diagnosis not present

## 2013-11-09 DIAGNOSIS — I5022 Chronic systolic (congestive) heart failure: Secondary | ICD-10-CM | POA: Diagnosis not present

## 2013-11-09 DIAGNOSIS — I2589 Other forms of chronic ischemic heart disease: Secondary | ICD-10-CM

## 2013-11-09 LAB — BASIC METABOLIC PANEL
Anion gap: 10 (ref 5–15)
BUN: 15 mg/dL (ref 6–23)
CHLORIDE: 104 meq/L (ref 96–112)
CO2: 26 mEq/L (ref 19–32)
Calcium: 9.7 mg/dL (ref 8.4–10.5)
Creatinine, Ser: 0.71 mg/dL (ref 0.50–1.10)
GFR calc Af Amer: >90 mL/min (ref >90)
GFR calc non Af Amer: 90 mL/min — ABNORMAL LOW (ref >90)
GLUCOSE: 132 mg/dL — AB (ref 70–99)
POTASSIUM: 3.9 meq/L (ref 3.7–5.3)
Sodium: 140 mEq/L (ref 137–147)

## 2013-11-09 NOTE — Progress Notes (Signed)
Respiratory medications given by RN prior to my arrival to unit.

## 2013-11-09 NOTE — Op Note (Signed)
NAME:  Becky [name], Becky [name]:  [id number]  MEDICAL RECORD NO.:  [id number]  LOCATION:  6C05C                        FACILITY:  [facility]  PHYSICIAN:  Hillis Range, MD       DATE OF BIRTH:  Jul 04, 1950  DATE OF PROCEDURE:  11/08/2013 DATE OF DISCHARGE:                              OPERATIVE REPORT   PREPROCEDURE DIAGNOSES: 1. Ischemic cardiomyopathy. 2. New York Heart Association class III congestive heart failure,     chronically. 3. Left bundle-branch block. 4. Syncope. 5. Left ventricular pacing lead dislodgement.  POSTPROCEDURE DIAGNOSES: 1. Ischemic cardiomyopathy. 2. New York Heart Association class III congestive heart failure,     chronically. 3. Left bundle-branch block. 4. Syncope. 5. Right atrial and left ventricular pacing lead dislodgement.  PROCEDURES:  Biventricular ICD system revision with removal of the previously placed right atrial and left ventricular leads and reimplantation of a new right atrial and left ventricular pacing lead.  SURGEON:  Hillis Range, MD  INTRODUCTION:  Becky [name] is a pleasant 63 year old female with an ischemic cardiomyopathy (ejection fraction 30%) and New York Heart Association class III congestive heart failure.  She underwent biventricular ICD implantation by me on June 02, 2013.  She did very well initially following device implantation.  Unfortunately, she subsequently developed dislodgement of her left ventricular pacing lead and developed symptoms of congestive heart failure.  She therefore presents today for system revision.  DESCRIPTION OF THE PROCEDURE:  Informed written consent was obtained, and the patient was brought to the Electrophysiology Lab in the fasting state.  The patient was adequately sedated with intravenous Versed and fentanyl as outlined in the nursing report.  The patient's ICD was interrogated and tachycardia therapies were programmed off for the procedure.  She is confirmed to have  loss of capture from the left ventricular proximal pacing electrodes.  Fluoroscopy revealed that the left ventricular lead had dislodged.  In addition, the right atrial and right ventricular lead had very little lead slack.  The right atrial lead appeared to have pending dislodgement.  The patient's left chest was prepped and draped in the usual sterile fashion by the EP Lab staff. The skin over the left deltopectoral region was infiltrated with lidocaine for local analgesia.  A 5-cm incision was made over the left deltopectoral region.  The ICD pocket was exposed using a combination of sharp and blunt dissection.  There was no foreign matter or debris within the pocket.  This device was disconnected from the leads.  The atrial and right ventricular leads were inspected and their integrity was confirmed to be intact.  The left ventricular pacing lead appeared to have a little of heme within the insulation, therefore I was concerned that there could be insulation breach with this lead today. The suture sleeves of all 3 leads were carefully removed and the #2 silk sutures were removed in their entirety.  A stylet was placed into the right atrial lead.  Unfortunately during this process, the right atrial lead dislodged back into the subclavian vein.  This lead was therefore removed from the body.  A stylet was placed into the right ventricular lead and adequate lead slack was placed gently advancing  the lead forward slightly.  A Mailman wire was placed into the LV lead, and the lead was removed carefully over the wire leaving the wire within the coronary sinus.  The left axillary vein was cannulated with fluoroscopic visualization. Through the left axillary vein, a Medtronic model [id number] (serial number [id number]) lead was advanced into the right atrial appendage.  In this location, P-waves measured 4.3 mV with impedance of 625 ohms and a threshold of 1.5 V at 0.5 milliseconds.  The  previously placed right ventricular defibrillator lead appeared to have adequate lead slack and was in adequate position with R-waves measuring 6 mV and an impedance of 526 ohms and a threshold of 0.7 V at 0.5 milliseconds.  This was a Medtronic, model [id number] (serial number [id number]) lead implanted on June 02, 2013.  This lead was therefore not revised today.  An MB2 guide was then placed over the [facility] wire and advanced into the body of the coronary sinus.  A selective venogram was performed by hand- injection of nonionic contrast.  This demonstrated a large and normal coronary sinus body.  The previously used distal lateral branch was atretic and could not be reused for lead placement today.  There was a middle lateral branch which was also a very small atretic.  There was a more proximal lateral branch which appeared to be moderate in size and had a very lateral position.  Finally, there was a very posterior vein which appeared to be large.  The Mailman wire was exchanged for a whisper CSJ wire.  This wire was then easily advanced into the proximal lateral branch of the coronary sinus into a very lateral position.  I then elected to place a new Medtronic model [id number] (serial [id number]) lead into this proximal branch of the coronary sinus in a very lateral position.  This was approximately 2/3rd from base to apex.  In this location, R-waves measured 20 mV with an impedance of 510 ohms and a threshold of 1.6 volts at 0.5 milliseconds and pacing with the LV4-RV coil configuration.  The MB2 guide was then removed in a standard fashion.  All 3 leads were then secured to the pectoralis fascia using #2 silk over the sutures leads.  The leads were then reconnected to the previously placed Medtronic Viva Quad XT CRT-D device (serial number [id number]).  This device was previously placed on June 02, 2013.  The pocket was irrigated with copious gentamicin solution.  The device was then  placed into the pocket.  The pocket was then closed in two layers with 2-0 Vicryl suture for the subcutaneous and subcuticular layers. Steri-Strips and a sterile dressing were then applied.  DFT testing was not performed today.  There were no apparent complications.  CONCLUSIONS: 1. Ischemic cardiomyopathy with a left bundle-branch block and chronic     New York Heart Association class III congestive heart failure. 2. Prior response to CRT with left ventricular lead     dislodgement.  Right atrial lead dislodgement was also demonstrated     today. 3. Successful replacement of the right atrial and left ventricular     leads. 4. No early apparent complications.     Hillis Range, MD     JA/MEDQ  D:  11/08/2013  T:  11/09/2013  Job:  [id number]

## 2013-11-09 NOTE — Discharge Instructions (Signed)
° ° ° °  Supplemental Discharge Instructions for  Pacemaker/Defibrillator Patients  Activity No heavy lifting or vigorous activity with your left/right arm for 6 to 8 weeks.  Do not raise your left/right arm above your head for one week.  Gradually raise your affected arm as drawn below.                       In 3 days                   In 4 days                    In 5 days                    In 6 days            NO DRIVING for  7 days    WOUND CARE   Keep the wound area clean and dry.  Do not get this area wet for one week. No showers for 10 days.   The tape/steri-strips on your wound will fall off; do not pull them off.  No bandage is needed on the site.  DO  NOT apply any creams, oils, or ointments to the wound area.   If you notice any drainage or discharge from the wound, any swelling or bruising at the site, or you develop a fever > 101? F after you are discharged home, call the office at once.  Special Instructions   You are still able to use cellular telephones; use the ear opposite the side where you have your pacemaker/defibrillator.  Avoid carrying your cellular phone near your device.   When traveling through airports, show security personnel your identification card to avoid being screened in the metal detectors.  Ask the security personnel to use the hand wand.   Avoid arc welding equipment, MRI testing (magnetic resonance imaging), TENS units (transcutaneous nerve stimulators).  Call the office for questions about other devices.   Avoid electrical appliances that are in poor condition or are not properly grounded.   Microwave ovens are safe to be near or to operate.  Additional information for defibrillator patients should your device go off:   If your device goes off ONCE and you feel fine afterward, notify the device clinic nurses.   If your device goes off ONCE and you do not feel well afterward, call 911.   If your device goes off TWICE, call 911.   If your device goes  off THREE times in one day, call 911.  DO NOT DRIVE YOURSELF OR A FAMILY MEMBER WITH A DEFIBRILLATOR TO THE HOSPITAL--CALL 911.

## 2013-11-17 ENCOUNTER — Ambulatory Visit: Payer: PRIVATE HEALTH INSURANCE

## 2013-11-18 ENCOUNTER — Telehealth: Payer: Self-pay | Admitting: Internal Medicine

## 2013-11-18 NOTE — Telephone Encounter (Signed)
New message  ° ° °Returning call back to nurse.  °

## 2013-11-18 NOTE — Telephone Encounter (Signed)
It was her follow up appointment call

## 2013-11-19 ENCOUNTER — Encounter (HOSPITAL_COMMUNITY): Payer: Self-pay | Admitting: *Deleted

## 2013-11-23 ENCOUNTER — Ambulatory Visit (INDEPENDENT_AMBULATORY_CARE_PROVIDER_SITE_OTHER): Payer: PRIVATE HEALTH INSURANCE | Admitting: *Deleted

## 2013-11-23 ENCOUNTER — Encounter: Payer: Self-pay | Admitting: Internal Medicine

## 2013-11-23 DIAGNOSIS — I5022 Chronic systolic (congestive) heart failure: Secondary | ICD-10-CM

## 2013-11-23 DIAGNOSIS — I255 Ischemic cardiomyopathy: Secondary | ICD-10-CM

## 2013-11-23 LAB — MDC_IDC_ENUM_SESS_TYPE_INCLINIC
Battery Remaining Longevity: 82 mo
Battery Voltage: 3.01 V
Brady Statistic AP VP Percent: 31.48 %
Brady Statistic AS VP Percent: 66.93 %
Brady Statistic AS VS Percent: 1.01 %
Brady Statistic RA Percent Paced: 32.06 %
HIGH POWER IMPEDANCE MEASURED VALUE: 247 Ohm
HIGH POWER IMPEDANCE MEASURED VALUE: 68 Ohm
Lead Channel Impedance Value: 532 Ohm
Lead Channel Impedance Value: 532 Ohm
Lead Channel Pacing Threshold Amplitude: 0.625 V
Lead Channel Pacing Threshold Amplitude: 0.875 V
Lead Channel Pacing Threshold Pulse Width: 0.4 ms
Lead Channel Pacing Threshold Pulse Width: 0.4 ms
Lead Channel Pacing Threshold Pulse Width: 0.8 ms
Lead Channel Sensing Intrinsic Amplitude: 3.25 mV
Lead Channel Sensing Intrinsic Amplitude: 7.875 mV
Lead Channel Setting Pacing Amplitude: 2.75 V
Lead Channel Setting Pacing Amplitude: 3.5 V
Lead Channel Setting Pacing Pulse Width: 0.4 ms
Lead Channel Setting Pacing Pulse Width: 0.8 ms
Lead Channel Setting Sensing Sensitivity: 0.3 mV
MDC IDC MSMT LEADCHNL LV PACING THRESHOLD AMPLITUDE: 1.5 V
MDC IDC MSMT LEADCHNL RA SENSING INTR AMPL: 3.25 mV
MDC IDC MSMT LEADCHNL RV SENSING INTR AMPL: 6.5 mV
MDC IDC SESS DTM: 20151007092445
MDC IDC SET LEADCHNL RV PACING AMPLITUDE: 2 V
MDC IDC SET ZONE DETECTION INTERVAL: 300 ms
MDC IDC SET ZONE DETECTION INTERVAL: 350 ms
MDC IDC SET ZONE DETECTION INTERVAL: 360 ms
MDC IDC STAT BRADY AP VS PERCENT: 0.57 %
MDC IDC STAT BRADY RV PERCENT PACED: 9.73 %
Zone Setting Detection Interval: 360 ms

## 2013-11-23 NOTE — Progress Notes (Signed)
Wound check appointment. Steri-strips removed prior to ov. Wound without redness or edema. Incision edges approximated, wound well healed. Small area above incision site appeared to be healing from tape removal. Normal device function. Thresholds, sensing, and impedances consistent with implant measurements. Device programmed at 3.5V for extra safety margin until 3 month visit. Histogram distribution appropriate for patient and level of activity. No mode switches or ventricular arrhythmias noted. Patient educated about wound care, arm mobility, lifting restrictions, shock plan. ROV in 3 months with JA/E.

## 2014-01-26 ENCOUNTER — Encounter (HOSPITAL_COMMUNITY): Payer: Self-pay | Admitting: Cardiovascular Disease

## 2014-01-31 ENCOUNTER — Ambulatory Visit (INDEPENDENT_AMBULATORY_CARE_PROVIDER_SITE_OTHER): Payer: PRIVATE HEALTH INSURANCE | Admitting: Cardiology

## 2014-01-31 ENCOUNTER — Encounter: Payer: Self-pay | Admitting: Cardiology

## 2014-01-31 VITALS — BP 150/84 | HR 64 | Ht 60.0 in | Wt 199.0 lb

## 2014-01-31 DIAGNOSIS — I5022 Chronic systolic (congestive) heart failure: Secondary | ICD-10-CM

## 2014-01-31 DIAGNOSIS — I2510 Atherosclerotic heart disease of native coronary artery without angina pectoris: Secondary | ICD-10-CM

## 2014-01-31 DIAGNOSIS — I10 Essential (primary) hypertension: Secondary | ICD-10-CM

## 2014-01-31 DIAGNOSIS — E785 Hyperlipidemia, unspecified: Secondary | ICD-10-CM

## 2014-01-31 MED ORDER — EZETIMIBE 10 MG PO TABS: 10.0000 mg | ORAL_TABLET | Freq: Every day | ORAL | Status: DC

## 2014-01-31 MED ORDER — METOPROLOL SUCCINATE ER 200 MG PO TB24: 200.0000 mg | ORAL_TABLET | Freq: Every day | ORAL | Status: DC

## 2014-01-31 NOTE — Progress Notes (Signed)
Clinical Summary Ms. Humphrey RollsMarlowe is a 63 y.o.female seen today for follow up of the following medical problems.   1. ICM  - history of multiple PCIs as described below, mainly in [place].  -Admit 01/02/13 with syncope and NSTEMI, initially presented to [place]. Per notes husband found her down, unclear if seizure related. Patient does not remember events. Trop peaked at 2.09. Cath showed no significant obstructive disease with patent stents, LVEF 25-30% by LV gram and 25-30% by echo during admission.  - titration of medical therapy has been limited due to orthostatic symptoms.  - repeat echo 04/2013 showed LVEF 30-35%, she has since followed up with EP and had a BiV AICD placed.   - stable DOE, denies any LE edema, no orthopnea  - compliant with meds. Has gone up to Toprol XL 200mg  daily, has some orthostatic symptoms but they are tolerable.  - limiting sodium intake, avoiding NSAIDs  - normal device check 11/2013 - she has been on long term plavix due to her extensive stent burden   2. OSA  - compliant with CPAP, followed by Dr Andrey CampanileWilson  3. Hyperlipidemia - muscle aches on multiple statins, has not been on therapy.  - 04/2013 TC 254 HDL 53 LDL 153    Past Medical History  Diagnosis Date  . GERD (gastroesophageal reflux disease)   . Hypertension   . HLD (hyperlipidemia)   . CAD (coronary artery disease)     a. 5/08: s/p DES to PDA and DES to LAD; b. 1/09: s/p DES to pLAD; c. 1/11: s/p DES to LAD x 2, d. LHC (3/12 in [place] - EF 55%, mild ant HK, nl LM, LAD stents ok, oLAD 30, RCA stents ok;  e.  4/13:  s/p DES to RCA, f. 1/14: dLM 20-30, mOM3 20-30, mLAD 80 ISR => s/p 2.5x16 mm Promus Element DES; EF 45-50%   . LBBB (left bundle Virdie Penning block)   . COPD (chronic obstructive pulmonary disease)   . Ischemic cardiomyopathy     a. Echo (11/14):  EF 25%   . Fibromyalgia   . Syncope 11/14    LifeVest placed  . CHF (congestive heart failure)   .  Depression   . Myocardial infarction 12/2012  . Asthma   . Chronic bronchitis   . Daily headache     "might be from sinus" (06/02/2013)  . Arthritis     "all my joints; neck, back, legs, arms, elbows" (06/02/2013)  . Chronic lower back pain     "into pelvis region" (06/02/2013)  . Anxiety      Allergies  Allergen Reactions  . Tape Rash and Other (See Comments)    Severe Reaction (latex tape): Burned hand, Redness-took days to clear up (03/02/12)  . Codeine Nausea And Vomiting  . Prozac [Fluoxetine Hcl] Other (See Comments)    Mean/violent thoughts  . Fosamax [Alendronate Sodium] Nausea Only and Other (See Comments)    Stomach pain  . Keflex [Cephalexin] Nausea Only  . Lipitor [Atorvastatin] Other (See Comments)    Leg pain     Current Outpatient Prescriptions  Medication Sig Dispense Refill  . albuterol (PROAIR HFA) 108 (90 BASE) MCG/ACT inhaler Inhale 2 puffs into the lungs every 4 (four) hours as needed for shortness of breath.     Marland Kitchen. aspirin 81 MG chewable tablet Chew 81 mg by mouth daily with breakfast.    . Calcium Carbonate-Vit D-Min (CALCIUM 1200 PO) Take 1,200 mg by mouth daily.    .Marland Kitchen  cetirizine (ZYRTEC) 10 MG tablet Take 10 mg by mouth daily.    . Cholecalciferol (VITAMIN D) 2000 UNITS CAPS Take 2,000 Units by mouth daily.    . clopidogrel (PLAVIX) 75 MG tablet Take 75 mg by mouth daily.     Marland Kitchen escitalopram (LEXAPRO) 20 MG tablet Take 20 mg by mouth daily.     . Fluticasone-Salmeterol (ADVAIR) 500-50 MCG/DOSE AEPB Inhale 1 puff into the lungs 2 (two) times daily.    . furosemide (LASIX) 20 MG tablet Take 20 mg by mouth daily.    Marland Kitchen HYDROcodone-acetaminophen (NORCO/VICODIN) 5-325 MG per tablet Take 1 tablet by mouth every 4 (four) hours as needed (for pain).     Marland Kitchen ipratropium-albuterol (DUONEB) 0.5-2.5 (3) MG/3ML SOLN Take 3 mLs by nebulization every 6 (six) hours as needed (shortness of breath).    . lactulose (CHRONULAC) 10 GM/15ML solution Take 10-20 g by mouth 2 (two)  times daily as needed (for constipation).     Marland Kitchen lidocaine (LIDODERM) 5 % Place 1 patch onto the skin daily as needed (for pain). Remove after 12 hours    . lisinopril (PRINIVIL,ZESTRIL) 5 MG tablet Take 1 tablet (5 mg total) by mouth daily. 30 tablet 6  . metoprolol succinate (TOPROL-XL) 100 MG 24 hr tablet Take 1 1/2 tab (150mg ) daily 135 tablet 3  . montelukast (SINGULAIR) 10 MG tablet Take 10 mg by mouth every morning.     . nitroGLYCERIN (NITROSTAT) 0.4 MG SL tablet Place 1 tablet (0.4 mg total) under the tongue every 5 (five) minutes x 3 doses as needed. If no relief after 3rd dose, proceed to ED 25 tablet 11  . pantoprazole (PROTONIX) 40 MG tablet Take 1 tablet (40 mg total) by mouth daily. 30 tablet 6  . rOPINIRole (REQUIP) 0.5 MG tablet Take 1 mg by mouth at bedtime.     Marland Kitchen SALINE NASAL SPRAY NA Place 1-2 sprays into the nose as needed (for congestion).     . SPIRIVA HANDIHALER 18 MCG inhalation capsule Place 1 capsule into inhaler and inhale daily.    Marland Kitchen spironolactone (ALDACTONE) 25 MG tablet Take 12.5 mg by mouth daily.    . traMADol (ULTRAM) 50 MG tablet Take 100 mg by mouth 2 (two) times daily.      No current facility-administered medications for this visit.     Past Surgical History  Procedure Laterality Date  . Orif ankle fracture Right 10/21/2009  . Tubal ligation  1989  . Coronary stent placement  02/2012  . Bi-ventricular implantable cardioverter defibrillator  (crt-d)  06/02/2013    MDT VivaQuad CRTD implanted by Dr Johney Frame for ICM, CHF  . Hysteroscopy w/ endometrial ablation  ~ 1998  . Coronary angioplasty with stent placement  2007; 2009; 2011    "?2 +1 +1 "  . Cardiac catheterization  12/2012    S/P MI  . Lead revision  11-08-13    RA and LV lead revision by Dr Johney Frame  . Left heart catheterization with coronary angiogram N/A 01/03/2013    Procedure: LEFT HEART CATHETERIZATION WITH CORONARY ANGIOGRAM;  Surgeon: Kathleene Hazel, MD;  Location: [facility] CATH LAB;   Service: Cardiovascular;  Laterality: N/A;  . Bi-ventricular implantable cardioverter defibrillator N/A 06/02/2013    Procedure: BI-VENTRICULAR IMPLANTABLE CARDIOVERTER DEFIBRILLATOR  (CRT-D);  Surgeon: Gardiner Rhyme, MD;  Location: [facility] CATH LAB;  Service: Cardiovascular;  Laterality: N/A;  . Lead revision Left 11/08/2013    Procedure: LEAD REVISION;  Surgeon: Gardiner Rhyme, MD;  Location: [facility] CATH LAB;  Service: Cardiovascular;  Laterality: Left;     Allergies  Allergen Reactions  . Tape Rash and Other (See Comments)    Severe Reaction (latex tape): Burned hand, Redness-took days to clear up (03/02/12)  . Codeine Nausea And Vomiting  . Prozac [Fluoxetine Hcl] Other (See Comments)    Mean/violent thoughts  . Fosamax [Alendronate Sodium] Nausea Only and Other (See Comments)    Stomach pain  . Keflex [Cephalexin] Nausea Only  . Lipitor [Atorvastatin] Other (See Comments)    Leg pain      Family History  Problem Relation Age of Onset  . Breast cancer Mother   . Heart failure Brother   . Breast cancer Sister   . Breast cancer Sister   . Uterine cancer Sister      Social History Ms. Guyse reports that she has been smoking Cigarettes.  She started smoking about 22 years ago. She has a 11.5 pack-year smoking history. She has never used smokeless tobacco. Ms. Cammer reports that she drinks alcohol.   Review of Systems CONSTITUTIONAL: No weight loss, fever, chills, weakness or fatigue.  HEENT: Eyes: No visual loss, blurred vision, double vision or yellow sclerae.No hearing loss, sneezing, congestion, runny nose or sore throat.  SKIN: No rash or itching.  CARDIOVASCULAR: per HPI RESPIRATORY: No shortness of breath, cough or sputum.  GASTROINTESTINAL: No anorexia, nausea, vomiting or diarrhea. No abdominal pain or blood.  GENITOURINARY: No burning on urination, no polyuria NEUROLOGICAL: No headache, dizziness, syncope, paralysis, ataxia, numbness or tingling in the extremities. No  change in bowel or bladder control.  MUSCULOSKELETAL: No muscle, back pain, joint pain or stiffness.  LYMPHATICS: No enlarged nodes. No history of splenectomy.  PSYCHIATRIC: No history of depression or anxiety.  ENDOCRINOLOGIC: No reports of sweating, cold or heat intolerance. No polyuria or polydipsia.  Marland Kitchen   Physical Examination p 64 bp 150/84 Wt 199 lbs BMI 39 Gen: resting comfortably, no acute distress HEENT: no scleral icterus, pupils equal round and reactive, no palptable cervical adenopathy,  CV: RRR, no m/r/g, no JVD, no carotid bruits Resp: Clear to auscultation bilaterally GI: abdomen is soft, non-tender, non-distended, normal bowel sounds, no hepatosplenomegaly MSK: extremities are warm, no edema.  Skin: warm, no rash Neuro:  no focal deficits Psych: appropriate affect   Diagnostic Studies  04/2010 Cath [place], [place]: LVEF 55%, mild anterior hypokinesis. LM normal, LAD with stents in prox and mid portion widely patent, LAD ostial 30%. Diags with luminal irregs. LCX with luminial irregs. RCA with distal patent stents  Stent cards  May 2008 DES to PDA, DES to LAD  Jan 2009 DES to prox LAD  Mar 05 2009 DES to LAD x2  05/2011 DES to RCA  Jan 2014 stent done [place], [place]  03/2010 Echo: LVEF 40%, hypokinesis of the anteroseptum.   01/02/13 Cath  Hemodynamic Findings:  Central aortic pressure: 147/79  Left ventricular pressure: 130/22/26  Angiographic Findings:  Left main: 30% distal stenosis. It appears that the distal left main has a stent that continues into the LAD.  Left Anterior Descending Artery: Large vessel that courses to the apex. The entire proximal and mid LAD is stented. There is mild stent restenosis in the proximal segment. The mid stented segment has diffuse 30% stent restenosis. The distal vessel becomes small in caliber and has mild diffuse plaque. There is a moderate caliber first diagonal Tonique Mendonca with mild plaque disease.  Circumflex  Artery: Moderate caliber non-dominant vessel with three moderate  caliber obtuse marginal branches. No obstructive disease.  Right Coronary Artery: Large dominant vessel with 30% proximal stenosis, heavily calcified mid vessel with patent stent in the mid vessel (no significant restenosis). The distal vessel has diffuse 40-50% stenosis. The posterolateral Anayi Bricco and PDA are moderate caliber, patent vessels with mild plaque disease.  Left Ventricular Angiogram: LVEF=25-30%. Hypokinesis of the antero-apical wall.  Impression:  1. 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 Elevated troponin following syncopal event. No culprit lesions seen.  3. Moderate to severe LV systolic dysfunction.  Recommendations: Continue medical management of CAD. Workup for syncope is underway. Carotid dopplers today. Will check d-dimer as PE is a possibility. She could also have had an arrythmia with LV dysfunction.  Complications: None. The patient tolerated the procedure well.  01/03/13 Echo  - LVEF 25-30%, akinesis of the anteroseptal and apical myocardium, grade I diastolic dysfunction,  01/03/13 Carotid US  - The vertebral arteries appear patent with antegrade flow. - Findings consistent with1- 39 percent stenosis,high end of scale,involving the right internal carotid artery. - Findings consistent with 40 - 59 percent stenosis, low end of scale,involving the left internal carotid artery. - ICA/CCA ratio. right =1.72. left = 1.52 Other specific details can be found in the table(s) above. Prepared and Electronically Authenticated by  12/13/12 Event monitor  No symptoms reported, sinus rhythm with conduction delay, occasional PVCs.  04/2013 Echo  Study Conclusions  - Left ventricle: The cavity size was normal. Wall thickness was increased in a pattern of mild LVH with moderate basal septal hypertrophy. Systolic function was moderately to severely reduced. The estimated ejection  fraction was in the range of 30% to 35%. There is akinesis to dyskinesis of the mid-distal anteroseptal and apical myocardium. There is akinesis of the distalinferoseptal myocardium. Doppler parameters are consistent with abnormal left ventricular relaxation (grade 1 diastolic dysfunction). - Ventricular septum: Septal motion showed abnormal function and dyssynergy. - Aortic valve: Mildly calcified annulus. Probably trileaflet; mildly calcified leaflets. No significant regurgitation. - Mitral valve: Calcified annulus. Trivial regurgitation. - Left atrium: The atrium was mildly dilated. - Right atrium: Central venous pressure: 8mm Hg (est). - Tricuspid valve: Trivial regurgitation. - Pulmonary arteries: PA peak pressure: 20mm Hg (S). - Pericardium, extracardiac: There was no pericardial effusion. Impressions:  - MildLVH with moderate basal septal hypertrophy, LVEF 30-35% with wall motion abnormalities as outlined, grade 1 diastolic dysfunction. Mild left atrial enlagement. MAC with trivial mitral regurgitation. MIldlysclerotic aortic valve. Trivial tricuspid regurgitation with normal PASP 20 mmHg.     Assessment and Plan  1. ICM/Chronic systolic heart failure  - LVEF 30-35%, known hx of CAD with last cath with patent stents.  - She is NYHA II, recent BiV AICD placed. She is euvolemic in clinic today  - having some orthostatic symptoms on current therapy that are overall tolerable, no further titration at this time - likely repeat echo in next few months after BiV ICD placement.   2. OSA - continue CPAP  3. Hyperlipidemia - intolerant to statins, continue dietary and lifestyle modification - due to recent data suggesting some outcome benefits with zetia, and her particularly high risk, will start zetia 10mg  daily.   4. HTN - elevated in clinic, however she has not taken her meds yet today - continue to follow  F/u 5 months  Antoine Poche, M.D.

## 2014-01-31 NOTE — Patient Instructions (Signed)
   Begin Zetia 10mg  daily  Increase Toprol XL to 200mg  daily (may take 2 tabs of your 100mg  tablet till finish current supply)   New prescriptions sent to pharmacy on above. Continue all other other medications.   Your physician wants you to follow up in: 5 months.  You will receive a reminder letter in the mail one-two months in advance.  If you don't receive a letter, please call our office to schedule the follow up appointment

## 2014-02-24 ENCOUNTER — Ambulatory Visit (INDEPENDENT_AMBULATORY_CARE_PROVIDER_SITE_OTHER): Payer: 59 | Admitting: Internal Medicine

## 2014-02-24 ENCOUNTER — Encounter: Payer: Self-pay | Admitting: Internal Medicine

## 2014-02-24 VITALS — BP 118/68 | HR 67 | Ht 60.0 in | Wt 199.0 lb

## 2014-02-24 DIAGNOSIS — I5022 Chronic systolic (congestive) heart failure: Secondary | ICD-10-CM

## 2014-02-24 DIAGNOSIS — I255 Ischemic cardiomyopathy: Secondary | ICD-10-CM

## 2014-02-24 LAB — MDC_IDC_ENUM_SESS_TYPE_INCLINIC
Battery Remaining Longevity: 88 mo
Battery Voltage: 2.99 V
Brady Statistic AP VP Percent: 45.1 %
Brady Statistic AS VP Percent: 53.26 %
Brady Statistic AS VS Percent: 0.85 %
Brady Statistic RA Percent Paced: 45.89 %
Date Time Interrogation Session: 20160108085601
HIGH POWER IMPEDANCE MEASURED VALUE: 190 Ohm
HighPow Impedance: 73 Ohm
Lead Channel Impedance Value: 494 Ohm
Lead Channel Impedance Value: 513 Ohm
Lead Channel Pacing Threshold Amplitude: 0.625 V
Lead Channel Pacing Threshold Pulse Width: 0.4 ms
Lead Channel Sensing Intrinsic Amplitude: 3 mV
Lead Channel Sensing Intrinsic Amplitude: 3.375 mV
Lead Channel Sensing Intrinsic Amplitude: 8.375 mV
Lead Channel Setting Pacing Amplitude: 2 V
Lead Channel Setting Pacing Amplitude: 2 V
Lead Channel Setting Pacing Pulse Width: 0.8 ms
Lead Channel Setting Sensing Sensitivity: 0.3 mV
MDC IDC MSMT LEADCHNL LV PACING THRESHOLD AMPLITUDE: 3 V
MDC IDC MSMT LEADCHNL LV PACING THRESHOLD PULSEWIDTH: 0.8 ms
MDC IDC MSMT LEADCHNL RA PACING THRESHOLD AMPLITUDE: 0.5 V
MDC IDC MSMT LEADCHNL RA PACING THRESHOLD PULSEWIDTH: 0.4 ms
MDC IDC MSMT LEADCHNL RV SENSING INTR AMPL: 8 mV
MDC IDC SET LEADCHNL RA PACING AMPLITUDE: 2 V
MDC IDC SET LEADCHNL RV PACING PULSEWIDTH: 0.4 ms
MDC IDC SET ZONE DETECTION INTERVAL: 350 ms
MDC IDC SET ZONE DETECTION INTERVAL: 360 ms
MDC IDC STAT BRADY AP VS PERCENT: 0.79 %
MDC IDC STAT BRADY RV PERCENT PACED: 21.93 %
Zone Setting Detection Interval: 300 ms
Zone Setting Detection Interval: 360 ms

## 2014-02-24 NOTE — Progress Notes (Signed)
PCP: Bedelia Person, MD Primary Cardiologist:  Dr Crisoforo Oxford Becky [name] is a 64 y.o. female who presents today for electrophysiology followup. Since her recent LV lead reposition, she has done very well.  She is pleased with her current health state.  Her SOB and exercise tolerance are much improved.  Fatigue is better but she still is "lazy" she says.  Today she denies CP, dizziness, presyncope, syncope or other issues.   Past Medical History  Diagnosis Date  . GERD (gastroesophageal reflux disease)   . Hypertension   . HLD (hyperlipidemia)   . CAD (coronary artery disease)     a. 5/08: s/p DES to PDA and DES to LAD; b. 1/09: s/p DES to pLAD; c. 1/11: s/p DES to LAD x 2, d. LHC (3/12 in [place] - EF 55%, mild ant HK, nl LM, LAD stents ok, oLAD 30, RCA stents ok;  e.  4/13:  s/p DES to RCA, f. 1/14: dLM 20-30, mOM3 20-30, mLAD 80 ISR => s/p 2.5x16 mm Promus Element DES; EF 45-50%   . LBBB (left bundle branch block)   . COPD (chronic obstructive pulmonary disease)   . Ischemic cardiomyopathy     a. Echo (11/14):  EF 25%   . Fibromyalgia   . Syncope 11/14    LifeVest placed  . CHF (congestive heart failure)   . Depression   . Myocardial infarction 12/2012  . Asthma   . Chronic bronchitis   . Daily headache     "might be from sinus" (06/02/2013)  . Arthritis     "all my joints; neck, back, legs, arms, elbows" (06/02/2013)  . Chronic lower back pain     "into pelvis region" (06/02/2013)  . Anxiety    Past Surgical History  Procedure Laterality Date  . Orif ankle fracture Right 10/21/2009  . Tubal ligation  1989  . Coronary stent placement  02/2012  . Bi-ventricular implantable cardioverter defibrillator  (crt-d)  06/02/2013    MDT VivaQuad CRTD implanted by Dr Johney Frame for ICM, CHF  . Hysteroscopy w/ endometrial ablation  ~ 1998  . Coronary angioplasty with stent placement  2007; 2009; 2011    "?2 +1 +1 "  . Cardiac catheterization  12/2012    S/P MI  . Lead revision  11-08-13     RA and LV lead revision by Dr Johney Frame  . Left heart catheterization with coronary angiogram N/A 01/03/2013    Procedure: LEFT HEART CATHETERIZATION WITH CORONARY ANGIOGRAM;  Surgeon: Kathleene Hazel, MD;  Location[facility] CATH LAB;  Service: Cardiovascular;  Laterality: N/A;  . Bi-ventricular implantable cardioverter defibrillator N/A 06/02/2013    Procedure: BI-VENTRICULAR IMPLANTABLE CARDIOVERTER DEFIBRILLATOR  (CRT-D);  Surgeon: Gardiner Rhyme, MD;  Location: [facility] CATH LAB;  Service: Cardiovascular;  Laterality: N/A;  . Lead revision Left 11/08/2013    Procedure: LEAD REVISION;  Surgeon: Gardiner Rhyme, MD;  Location: [facility] CATH LAB;  Service: Cardiovascular;  Laterality: Left;    Current Outpatient Prescriptions  Medication Sig Dispense Refill  . albuterol (PROAIR HFA) 108 (90 BASE) MCG/ACT inhaler Inhale 2 puffs into the lungs every 4 (four) hours as needed for shortness of breath.     Marland Kitchen aspirin 81 MG chewable tablet Chew 81 mg by mouth daily with breakfast.    . Calcium Carbonate-Vit D-Min (CALCIUM 1200 PO) Take 1,200 mg by mouth daily.    . cetirizine (ZYRTEC) 10 MG tablet Take 10 mg by mouth daily.    . Cholecalciferol (VITAMIN D)  2000 UNITS CAPS Take 2,000 Units by mouth daily.    . clopidogrel (PLAVIX) 75 MG tablet Take 75 mg by mouth daily.     Marland Kitchen escitalopram (LEXAPRO) 20 MG tablet Take 20 mg by mouth daily.     Marland Kitchen ezetimibe (ZETIA) 10 MG tablet Take 1 tablet (10 mg total) by mouth daily. 30 tablet 6  . Fluticasone-Salmeterol (ADVAIR) 500-50 MCG/DOSE AEPB Inhale 1 puff into the lungs 2 (two) times daily.    . furosemide (LASIX) 20 MG tablet Take 20 mg by mouth daily.    Marland Kitchen HYDROcodone-acetaminophen (NORCO/VICODIN) 5-325 MG per tablet Take 1 tablet by mouth every 4 (four) hours as needed (for pain).     Marland Kitchen ipratropium-albuterol (DUONEB) 0.5-2.5 (3) MG/3ML SOLN Take 3 mLs by nebulization every 6 (six) hours as needed (shortness of breath).    . lactulose (CHRONULAC) 10 GM/15ML solution  Take 10-20 g by mouth 2 (two) times daily as needed (for constipation).     Marland Kitchen lisinopril (PRINIVIL,ZESTRIL) 5 MG tablet Take 1 tablet (5 mg total) by mouth daily. 30 tablet 6  . metoprolol succinate (TOPROL-XL) 200 MG 24 hr tablet Take 1 tablet (200 mg total) by mouth daily. 30 tablet 6  . montelukast (SINGULAIR) 10 MG tablet Take 10 mg by mouth every morning.     . nitroGLYCERIN (NITROSTAT) 0.4 MG SL tablet Place 1 tablet (0.4 mg total) under the tongue every 5 (five) minutes x 3 doses as needed. If no relief after 3rd dose, proceed to ED 25 tablet 11  . pantoprazole (PROTONIX) 40 MG tablet Take 1 tablet (40 mg total) by mouth daily. 30 tablet 6  . rOPINIRole (REQUIP) 0.5 MG tablet Take 1 mg by mouth at bedtime.     Marland Kitchen SALINE NASAL SPRAY NA Place 1-2 sprays into the nose as needed (for congestion).     . SPIRIVA HANDIHALER 18 MCG inhalation capsule Place 1 capsule into inhaler and inhale daily.    Marland Kitchen spironolactone (ALDACTONE) 25 MG tablet Take 12.5 mg by mouth daily.    . traMADol (ULTRAM) 50 MG tablet Take 100 mg by mouth 2 (two) times daily.      No current facility-administered medications for this visit.    Physical Exam: Filed Vitals:   02/24/14 0825  BP: 118/68  Pulse: 67  Height: 5' (1.524 m)  Weight: 199 lb (90.266 kg)  SpO2: 95%    GEN- The patient is well appearing, alert and oriented x 3 today.   Head- normocephalic, atraumatic Eyes-  Sclera clear, conjunctiva pink Ears- hearing intact Oropharynx- clear Lungs- Clear to ausculation bilaterally, normal work of breathing Chest- ICD pocket is well healed Heart- Regular rate and rhythm, no murmurs, rubs or gallops, PMI not laterally displaced GI- soft, NT, ND, + BS Extremities- no clubbing, cyanosis, or edema  ICD interrogation- reviewed in detail today,  See PACEART report  Assessment and Plan:  1.  Chronic systolic dysfunction/ Ischemic CM euvolemic today Stable on an appropriate medical regimen Doing well s/p lead  revision Device reprogrammed today for best vector/ threshold configuration Will enroll in ICM device clinic with Sherri Rad  [facility] Return to see me in 1 year Agree with Dr Wyline Mood that repeat echo in 3 months would be ideal to assess response to CRT.

## 2014-02-24 NOTE — Patient Instructions (Addendum)
Your physician recommends that you schedule a follow-up appointment in: 1 year. You will receive a reminder letter in the mail in about 10 months reminding you to call and schedule your appointment. If you don't receive this letter, please contact our office. [facility] device check 05/25/14. Your physician recommends that you continue on your current medications as directed. Please refer to the Current Medication list given to you today. Sherri Rad will be contacting your. Your physician has requested that you have an echocardiogram in 3 months. Echocardiography is a painless test that uses sound waves to create images of your heart. It provides your doctor with information about the size and shape of your heart and how well your heart's chambers and valves are working. This procedure takes approximately one hour. There are no restrictions for this procedure.

## 2014-04-03 ENCOUNTER — Ambulatory Visit (INDEPENDENT_AMBULATORY_CARE_PROVIDER_SITE_OTHER): Payer: Medicare Other | Admitting: *Deleted

## 2014-04-03 ENCOUNTER — Telehealth: Payer: Self-pay | Admitting: *Deleted

## 2014-04-03 DIAGNOSIS — I5022 Chronic systolic (congestive) heart failure: Secondary | ICD-10-CM

## 2014-04-03 DIAGNOSIS — Z95810 Presence of automatic (implantable) cardiac defibrillator: Secondary | ICD-10-CM

## 2014-04-03 NOTE — Telephone Encounter (Signed)
ICM transmission received. I left a message for the patient to call. 

## 2014-04-04 ENCOUNTER — Encounter: Payer: Self-pay | Admitting: *Deleted

## 2014-04-04 NOTE — Progress Notes (Signed)
EPIC Encounter for ICM Monitoring  Patient Name: Becky [name] is a 64 y.o. female Date: 04/04/2014 Primary Care Physican: Bedelia Person, MD Primary Cardiologist: Branch Electrophysiologist: Allred Dry Weight: 197 lbs      In the past month, have you:  1. Gained more than 2 pounds in a day or more than 5 pounds in a week? Yes. The patient reports that her weight is up to about 199-200 lbs over the last few days.   2. Had changes in your medications (with verification of current medications)? no  3. Had more shortness of breath than is usual for you? No. She has been dealing with a cold recently and having typical symptoms with this.   4. Limited your activity because of shortness of breath? no  5. Not been able to sleep because of shortness of breath? no  6. Had increased swelling in your feet or ankles? no  7. Had symptoms of dehydration (dizziness, dry mouth, increased thirst, decreased urine output) no  8. Had changes in sodium restriction? no  9. Been compliant with medication? Yes   ICM trend:   Follow-up plan: ICM clinic phone appointment: 05/08/14. The patient's optivol readings have been elevated from ~ 1/30-present. She has had a 2 lb weight gain recently, but no symptoms otherwise. She typically takes lasix 20 mg once daily. I have advised her to increase lasix to 40 mg x 3 days then resume 20 mg once daily. She voices understanding.   Copy of note sent to patient's primary care physician, primary cardiologist, and device following physician.  Sherri Rad, RN, BSN 04/04/2014 5:36 PM

## 2014-04-05 NOTE — Telephone Encounter (Signed)
Late entry- I spoke with the patient yesterday. 

## 2014-05-04 ENCOUNTER — Other Ambulatory Visit: Payer: Self-pay | Admitting: Cardiology

## 2014-05-08 ENCOUNTER — Ambulatory Visit (INDEPENDENT_AMBULATORY_CARE_PROVIDER_SITE_OTHER): Payer: Medicare Other | Admitting: *Deleted

## 2014-05-08 DIAGNOSIS — I255 Ischemic cardiomyopathy: Secondary | ICD-10-CM

## 2014-05-08 DIAGNOSIS — Z95810 Presence of automatic (implantable) cardiac defibrillator: Secondary | ICD-10-CM | POA: Diagnosis not present

## 2014-05-08 DIAGNOSIS — I5022 Chronic systolic (congestive) heart failure: Secondary | ICD-10-CM | POA: Diagnosis not present

## 2014-05-10 ENCOUNTER — Telehealth: Payer: Self-pay | Admitting: *Deleted

## 2014-05-10 NOTE — Telephone Encounter (Signed)
ICM transmission received. I left a message I will call her back tomorrow.

## 2014-05-11 NOTE — Telephone Encounter (Signed)
I left a message for the patient to call. 

## 2014-05-12 ENCOUNTER — Encounter: Payer: Self-pay | Admitting: *Deleted

## 2014-05-12 NOTE — Telephone Encounter (Signed)
I spoke with the patient. 

## 2014-05-12 NOTE — Telephone Encounter (Signed)
F/u ° ° °Pt returning your call °

## 2014-05-12 NOTE — Progress Notes (Signed)
EPIC Encounter for ICM Monitoring  Patient Name: Becky [name] is a 64 y.o. female Date: 05/12/2014 Primary Care Physican: Bedelia Person, MD Primary Cardiologist: Branch Electrophysiologist: Allred Dry Weight: 190 lbs       In the past month, have you:  1. Gained more than 2 pounds in a day or more than 5 pounds in a week? no  2. Had changes in your medications (with verification of current medications)? Yes. She has been on ampicillin and meclizine.  3. Had more shortness of breath than is usual for you? No. She has had some sinus problems and has been on ampicillin and meclizine for inner ear. She feels much better at this point.  4. Limited your activity because of shortness of breath? no  5. Not been able to sleep because of shortness of breath? no  6. Had increased swelling in your feet or ankles? no  7. Had symptoms of dehydration (dizziness, dry mouth, increased thirst, decreased urine output) no  8. Had changes in sodium restriction? no  9. Been compliant with medication? Yes   ICM trend:   Follow-up plan: ICM clinic phone appointment: 06/12/14  Copy of note sent to patient's primary care physician, primary cardiologist, and device following physician.  Sherri Rad, RN, BSN 05/12/2014 12:59 PM

## 2014-05-24 ENCOUNTER — Other Ambulatory Visit: Payer: Self-pay

## 2014-05-24 ENCOUNTER — Other Ambulatory Visit (INDEPENDENT_AMBULATORY_CARE_PROVIDER_SITE_OTHER): Payer: 59

## 2014-05-24 DIAGNOSIS — I5022 Chronic systolic (congestive) heart failure: Secondary | ICD-10-CM

## 2014-05-24 DIAGNOSIS — I255 Ischemic cardiomyopathy: Secondary | ICD-10-CM

## 2014-05-25 ENCOUNTER — Telehealth: Payer: Self-pay | Admitting: Internal Medicine

## 2014-05-25 NOTE — Telephone Encounter (Signed)
Is followed by Dr Wyline Mood in [place].  Will forward to them to see if she can get in to be seen by him  Discussed with Gypsy Balsam, NP

## 2014-05-25 NOTE — Telephone Encounter (Signed)
Is patient able to see me or Samara Deist in [place] tomorrow afternoon?  Dominga Ferry MD

## 2014-05-25 NOTE — Telephone Encounter (Signed)
Message forwarded to Dr. Wyline Mood for further advice.

## 2014-05-25 NOTE — Telephone Encounter (Signed)
Patient & husband notified.  OV scheduled with Joni Reining, NP tomorrow at 2:10 in our [place] office.

## 2014-05-25 NOTE — Telephone Encounter (Signed)
New message     Pt c/o Shortness Of Breath: STAT if SOB developed within the last 24 hours or pt is noticeably SOB on the phone  1. Are you currently SOB (can you hear that pt is SOB on the phone)? no 2. How long have you been experiencing SOB?  Started today  3. Are you SOB when sitting or when up moving around?  Both----pt took a breathing treatment this am for asthma.  It helped a little, but still sob  4. Are you currently experiencing any other symptoms?  Gained 5 lbs since yesterday

## 2014-05-26 ENCOUNTER — Encounter: Payer: Self-pay | Admitting: Adult Health

## 2014-05-26 ENCOUNTER — Ambulatory Visit: Payer: 59 | Admitting: Adult Health

## 2014-05-26 ENCOUNTER — Ambulatory Visit (INDEPENDENT_AMBULATORY_CARE_PROVIDER_SITE_OTHER): Payer: 59 | Admitting: Adult Health

## 2014-05-26 VITALS — BP 122/74 | HR 69 | Ht 60.0 in | Wt 201.4 lb

## 2014-05-26 DIAGNOSIS — I10 Essential (primary) hypertension: Secondary | ICD-10-CM

## 2014-05-26 MED ORDER — POTASSIUM CHLORIDE CRYS ER 20 MEQ PO TBCR: 20.0000 meq | EXTENDED_RELEASE_TABLET | Freq: Every day | ORAL | Status: DC

## 2014-05-26 NOTE — Progress Notes (Signed)
Cardiology Office Note   Date:  05/26/2014   ID:  [name], DOB 1950/04/29, MRN [id number]  PCP:  Bedelia Person, MD  Cardiologist:  Branch/Allred/ Joni Reining, NP   Chief Complaint  Patient presents with  . Cardiomyopathy    Ischemic  . Coronary Artery Disease  . Shortness of Breath      History of Present Illness: Becky [name] is a 64 y.o. female who presents for ongoing assessment and management of CAD, drug-eluting stents placed in 2008, 2011, and 2014.  Chest has a history of ischemic myopathy, with most recent echo, revealing EF of 25%.  The patient has not by the implantable cardioverter defibrillator, [employer]tr[employer], she presents today as an addition to my clinic in the setting of worsening shortness of breath, lower chin.  The edema, and weight gain.    Past Medical History  Diagnosis Date  . GERD (gastroesophageal reflux disease)   . Hypertension   . HLD (hyperlipidemia)   . CAD (coronary artery disease)     a. 5/08: s/p DES to PDA and DES to LAD; b. 1/09: s/p DES to pLAD; c. 1/11: s/p DES to LAD x 2, d. LHC (3/12 in [place] - EF 55%, mild ant HK, nl LM, LAD stents ok, oLAD 30, RCA stents ok;  e.  4/13:  s/p DES to RCA, f. 1/14: dLM 20-30, mOM3 20-30, mLAD 80 ISR => s/p 2.5x16 mm Promus Element DES; EF 45-50%   . LBBB (left bundle branch block)   . COPD (chronic obstructive pulmonary disease)   . Ischemic cardiomyopathy     a. Echo (11/14):  EF 25%   . Fibromyalgia   . Syncope 11/14    LifeVest placed  . CHF (congestive heart failure)   . Depression   . Myocardial infarction 12/2012  . Asthma   . Chronic bronchitis   . Daily headache     "might be from sinus" (06/02/2013)  . Arthritis     "all my joints; neck, back, legs, arms, elbows" (06/02/2013)  . Chronic lower back pain     "into pelvis region" (06/02/2013)  . Anxiety     Past Surgical History  Procedure Laterality Date  . Orif ankle fracture Right 10/21/2009  . Tubal ligation  1989  .  Coronary stent placement  02/2012  . Bi-ventricular implantable cardioverter defibrillator  (crt-d)  06/02/2013    MDT VivaQuad CRTD implanted by Dr Johney Frame for ICM, CHF  . Hysteroscopy w/ endometrial ablation  ~ 1998  . Coronary angioplasty with stent placement  2007; 2009; 2011    "?2 +1 +1 "  . Cardiac catheterization  12/2012    S/P MI  . Lead revision  11-08-13    RA and LV lead revision by Dr Johney Frame  . Left heart catheterization with coronary angiogram N/A 01/03/2013    Procedure: LEFT HEART CATHETERIZATION WITH CORONARY ANGIOGRAM;  Surgeon: Kathleene Hazel, MD;  Location: [facility] CATH LAB;  Service: Cardiovascular;  Laterality: N/A;  . Bi-ventricular implantable cardioverter defibrillator N/A 06/02/2013    Procedure: BI-VENTRICULAR IMPLANTABLE CARDIOVERTER DEFIBRILLATOR  (CRT-D);  Surgeon: Gardiner Rhyme, MD;  Location[facility] C[facility] LAB;  Service: Cardiovascular;  Laterality: N/A;  . Lead revision Left 11/08/2013    Procedure: LEAD REVISION;  Surgeon: Gardiner Rhyme, MD;  Location: [facility] CATH LAB;  Service: Cardiovascular;  Laterality: Left;     Current Outpatient Prescriptions  Medication Sig Dispense Refill  . albuterol (PROAIR HFA) 108 (90 BASE) MCG/ACT inhaler Inhale  2 puffs into the lungs every 4 (four) hours as needed for shortness of breath.     Marland Kitchen aspirin 81 MG chewable tablet Chew 81 mg by mouth daily with breakfast.    . Calcium Carbonate-Vit D-Min (CALCIUM 1200 PO) Take 1,200 mg by mouth daily.    . cetirizine (ZYRTEC) 10 MG tablet Take 10 mg by mouth daily.    . Cholecalciferol (VITAMIN D) 2000 UNITS CAPS Take 2,000 Units by mouth daily.    . clopidogrel (PLAVIX) 75 MG tablet Take 75 mg by mouth daily.     Marland Kitchen escitalopram (LEXAPRO) 20 MG tablet Take 20 mg by mouth daily.     Marland Kitchen ezetimibe (ZETIA) 10 MG tablet Take 1 tablet (10 mg total) by mouth daily. 30 tablet 6  . Fluticasone-Salmeterol (ADVAIR) 500-50 MCG/DOSE AEPB Inhale 1 puff into the lungs 2 (two) times daily.    .  furosemide (LASIX) 20 MG tablet Take 20 mg by mouth daily.    Marland Kitchen HYDROcodone-acetaminophen (NORCO/VICODIN) 5-325 MG per tablet Take 1 tablet by mouth every 4 (four) hours as needed (for pain).     Marland Kitchen ipratropium-albuterol (DUONEB) 0.5-2.5 (3) MG/3ML SOLN Take 3 mLs by nebulization every 6 (six) hours as needed (shortness of breath).    . lactulose (CHRONULAC) 10 GM/15ML solution Take 10-20 g by mouth 2 (two) times daily as needed (for constipation).     Marland Kitchen lisinopril (PRINIVIL,ZESTRIL) 5 MG tablet Take 1 tablet (5 mg total) by mouth daily. 30 tablet 6  . metoprolol succinate (TOPROL-XL) 200 MG 24 hr tablet Take 1 tablet (200 mg total) by mouth daily. 30 tablet 6  . montelukast (SINGULAIR) 10 MG tablet Take 10 mg by mouth every morning.     . nitroGLYCERIN (NITROSTAT) 0.4 MG SL tablet Place 1 tablet (0.4 mg total) under the tongue every 5 (five) minutes x 3 doses as needed. If no relief after 3rd dose, proceed to ED 25 tablet 11  . pantoprazole (PROTONIX) 40 MG tablet TAKE 1 TABLET BY MOUTH DAILY 30 tablet 6  . rOPINIRole (REQUIP) 0.5 MG tablet Take 1 mg by mouth at bedtime.     Marland Kitchen SALINE NASAL SPRAY NA Place 1-2 sprays into the nose as needed (for congestion).     . SPIRIVA HANDIHALER 18 MCG inhalation capsule Place 1 capsule into inhaler and inhale daily.    Marland Kitchen spironolactone (ALDACTONE) 25 MG tablet Take 12.5 mg by mouth daily.    . traMADol (ULTRAM) 50 MG tablet Take 100 mg by mouth 2 (two) times daily.      No current facility-administered medications for this visit.    Allergies:   Tape; Codeine; Prozac; Fosamax; Keflex; and Lipitor    Social History:  The patient  reports that she has been smoking Cigarettes.  She started smoking about 23 years ago. She has a 11.5 pack-year smoking history. She has never used smokeless tobacco. She reports that she drinks alcohol. She reports that she does not use illicit drugs.   Family History:  The patient's family history includes Breast cancer in her  mother, sister, and sister; Heart failure in her brother; Uterine cancer in her sister.    ROS: .   All other systems are reviewed and negative.Unless otherwise mentioned in  H&P above.   PHYSICAL EXAM: VS:  There were no vitals taken for this visit. , BMI There is no weight on file to calculate BMI. GEN: Well nourished, well developed, in no acute distress HEENT: normal Neck: no  JVD, carotid bruits, or masses Cardiac: RRR;distant,  no murmurs, rubs, or gallops,no edema  Respiratory:  clear to auscultation bilaterally, normal work of breathing GI: soft, nontender, nondistended, + BS MS: no deformity or atrophy Skin: warm and dry, no rash Neuro:  Strength and sensation are intact Psych: euthymic mood, full affect    Recent Labs: 11/08/2013: Hemoglobin 14.2; Platelets 217 11/09/2013: BUN 15; Creatinine 0.71; Potassium 3.9; Sodium 140    Lipid Panel    Component Value Date/Time   CHOL 152 01/03/2013 0040   TRIG 139 01/03/2013 0040   HDL 46 01/03/2013 0040   CHOLHDL 3.3 01/03/2013 0040   VLDL 28 01/03/2013 0040   LDLCALC 78 01/03/2013 0040      Wt Readings from Last 3 Encounters:  02/24/14 199 lb (90.266 kg)  01/31/14 199 lb (90.266 kg)  11/09/13 194 lb 0.1 oz (88 kg)      Other studies Reviewed: Additional studies/ records that were reviewed today include: Echocardiogram. Review of the above records demonstrates: Normal LV fx EF of 55%, Grade I diastolic fx.   ASSESSMENT AND PLAN:  1.Mild Acute/Chronic Diastolic CHF: She has gained 4-5 lbs with associated worsening dyspnea and abdominal distention. She admits to eating salty foods such as boxed macaroni and cheese, and other salty foods. She is advised to take one extra dose of lasix today, with potassium. She does not have any potassium supplements as she is on spironolactone, but will give her one dose with extra lasix X 1. BMET is ordered.   2. CAD: Stentes to the RCA, LAD, and PDA. Continue DAPT and BB and ACE.    3. Ischemic CM: Improved EF per echo this week to normal. Continue current medications. Keep appt with Dr. Wyline Mood in May..      Current medicines are reviewed at length with the patient today.    Labs/ tests ordered today include: BMET No orders of the defined types were placed in this encounter.     Disposition:   FU with Dr. Wyline Mood in May as directed.  Signed, Joni Reining, NP  05/26/2014 7:36 AM     [facility] [address] [place], [place] [address] Phone: [phone]; Fax: [phone]

## 2014-05-26 NOTE — Patient Instructions (Addendum)
Your physician recommends that you schedule a follow-up appointment in: Please keep appt. With Dr. Wyline Mood  Your physician has recommended you make the following change in your medication:   Start taking Potassium 20 meq daily   * Today take Potassium and an extra Lasix. May repeat tomorrow if you do not have a 3 pound weight loss. *  You have been given a copy of a low salt diet.   Thank you for choosing [facility]!

## 2014-05-26 NOTE — Progress Notes (Deleted)
Name: CHRISTIANNE ZACHER    DOB: 03/14/1950  Age: 64 y.o.  MR#: [id number]       PCP:  Bedelia Person, MD      Insurance: Payor: [employer] / Plan: [employer] OTHER / Product Type: *No Product type* /   CC:    Chief Complaint  Patient presents with  . Cardiomyopathy    Ischemic  . Coronary Artery Disease  . Shortness of Breath    VS Filed Vitals:   05/26/14 1350  BP: 122/74  Pulse: 69  Height: 5' (1.524 m)  Weight: 201 lb 6.4 oz (91.354 kg)  SpO2: 94%    Weights Current Weight  05/26/14 201 lb 6.4 oz (91.354 kg)  02/24/14 199 lb (90.266 kg)  01/31/14 199 lb (90.266 kg)    Blood Pressure  BP Readings from Last 3 Encounters:  05/26/14 122/74  02/24/14 118/68  01/31/14 150/84     Admit date:  (Not on file) Last encounter with RMR:  Visit date not found   Allergy Tape; Codeine; Prozac; Fosamax; Keflex; and Lipitor  Current Outpatient Prescriptions  Medication Sig Dispense Refill  . albuterol (PROAIR HFA) 108 (90 BASE) MCG/ACT inhaler Inhale 2 puffs into the lungs every 4 (four) hours as needed for shortness of breath.     . Aspirin-Caffeine (BC FAST PAIN RELIEF PO) Take by mouth.    . Calcium Carbonate-Vit D-Min (CALCIUM 1200 PO) Take 1,200 mg by mouth daily.    . cetirizine (ZYRTEC) 10 MG tablet Take 10 mg by mouth daily.    . Cholecalciferol (VITAMIN D) 2000 UNITS CAPS Take 2,000 Units by mouth daily.    . clopidogrel (PLAVIX) 75 MG tablet Take 75 mg by mouth daily.     Marland Kitchen escitalopram (LEXAPRO) 20 MG tablet Take 20 mg by mouth daily.     Marland Kitchen ezetimibe (ZETIA) 10 MG tablet Take 1 tablet (10 mg total) by mouth daily. 30 tablet 6  . Fluticasone-Salmeterol (ADVAIR) 500-50 MCG/DOSE AEPB Inhale 1 puff into the lungs 2 (two) times daily.    . furosemide (LASIX) 20 MG tablet Take 20 mg by mouth daily.    Marland Kitchen HYDROcodone-acetaminophen (NORCO/VICODIN) 5-325 MG per tablet Take 1 tablet by mouth every 4 (four) hours as needed (for pain).     Marland Kitchen ipratropium-albuterol  (DUONEB) 0.5-2.5 (3) MG/3ML SOLN Take 3 mLs by nebulization every 6 (six) hours as needed (shortness of breath).    . lactulose (CHRONULAC) 10 GM/15ML solution Take 10-20 g by mouth 2 (two) times daily as needed (for constipation).     Marland Kitchen lisinopril (PRINIVIL,ZESTRIL) 5 MG tablet Take 1 tablet (5 mg total) by mouth daily. 30 tablet 6  . metoprolol succinate (TOPROL-XL) 200 MG 24 hr tablet Take 1 tablet (200 mg total) by mouth daily. 30 tablet 6  . montelukast (SINGULAIR) 10 MG tablet Take 10 mg by mouth every morning.     . nitroGLYCERIN (NITROSTAT) 0.4 MG SL tablet Place 1 tablet (0.4 mg total) under the tongue every 5 (five) minutes x 3 doses as needed. If no relief after 3rd dose, proceed to ED 25 tablet 11  . pantoprazole (PROTONIX) 40 MG tablet TAKE 1 TABLET BY MOUTH DAILY 30 tablet 6  . rOPINIRole (REQUIP) 0.5 MG tablet Take 1 mg by mouth at bedtime.     Marland Kitchen SALINE NASAL SPRAY NA Place 1-2 sprays into the nose as needed (for congestion).     . SPIRIVA HANDIHALER 18 MCG inhalation capsule Place 1 capsule into  inhaler and inhale daily.    Marland Kitchen spironolactone (ALDACTONE) 25 MG tablet Take 12.5 mg by mouth daily.    . traMADol (ULTRAM) 50 MG tablet Take 100 mg by mouth 2 (two) times daily.     Marland Kitchen amoxicillin-clavulanate (AUGMENTIN) 875-125 MG per tablet   0  . meclizine (ANTIVERT) 25 MG tablet   0   No current facility-administered medications for this visit.    Discontinued Meds:    Medications Discontinued During This Encounter  Medication Reason  . aspirin 81 MG chewable tablet Error    Patient Active Problem List   Diagnosis Date Noted  . Chronic systolic dysfunction of left ventricle 11/08/2013  . Chronic systolic CHF (congestive heart failure) 06/02/2013  . Cardiomyopathy, ischemic 04/27/2013  . NSTEMI (non-ST elevated myocardial infarction) 01/02/2013  . Syncope 01/02/2013  . Coronary atherosclerosis of native coronary artery 12/13/2012  . Chest pain 12/13/2012  . Essential  hypertension 12/13/2012  . Hyperlipidemia 12/13/2012  . Palpitations 12/13/2012    LABS    Component Value Date/Time   NA 140 11/09/2013 0322   NA 138 11/08/2013 0925   NA 138 06/03/2013 0500   K 3.9 11/09/2013 0322   K 3.7 11/08/2013 0925   K 3.5* 06/03/2013 0500   CL 104 11/09/2013 0322   CL 102 11/08/2013 0925   CL 104 06/03/2013 0500   CO2 26 11/09/2013 0322   CO2 26 11/08/2013 0925   CO2 23 06/03/2013 0500   GLUCOSE 132* 11/09/2013 0322   GLUCOSE 102* 11/08/2013 0925   GLUCOSE 149* 06/03/2013 0500   BUN 15 11/09/2013 0322   BUN 21 11/08/2013 0925   BUN 9 06/03/2013 0500   CREATININE 0.71 11/09/2013 0322   CREATININE 0.92 11/08/2013 0925   CREATININE 0.61 06/03/2013 0500   CALCIUM 9.7 11/09/2013 0322   CALCIUM 10.4 11/08/2013 0925   CALCIUM 9.4 06/03/2013 0500   GFRNONAA 90* 11/09/2013 0322   GFRNONAA 65* 11/08/2013 0925   GFRNONAA >90 06/03/2013 0500   GFRAA >90 11/09/2013 0322   GFRAA 75* 11/08/2013 0925   GFRAA >90 06/03/2013 0500   CMP     Component Value Date/Time   NA 140 11/09/2013 0322   K 3.9 11/09/2013 0322   CL 104 11/09/2013 0322   CO2 26 11/09/2013 0322   GLUCOSE 132* 11/09/2013 0322   BUN 15 11/09/2013 0322   CREATININE 0.71 11/09/2013 0322   CALCIUM 9.7 11/09/2013 0322   GFRNONAA 90* 11/09/2013 0322   GFRAA >90 11/09/2013 0322       Component Value Date/Time   WBC 5.8 11/08/2013 0925   WBC 8.1 06/02/2013 1208   WBC 6.4 01/05/2013 1005   HGB 14.2 11/08/2013 0925   HGB 15.2* 06/02/2013 1208   HGB 13.5 01/05/2013 1005   HCT 41.8 11/08/2013 0925   HCT 44.1 06/02/2013 1208   HCT 38.4 01/05/2013 1005   MCV 91.5 11/08/2013 0925   MCV 91.1 06/02/2013 1208   MCV 90.4 01/05/2013 1005    Lipid Panel     Component Value Date/Time   CHOL 152 01/03/2013 0040   TRIG 139 01/03/2013 0040   HDL 46 01/03/2013 0040   CHOLHDL 3.3 01/03/2013 0040   VLDL 28 01/03/2013 0040   LDLCALC 78 01/03/2013 0040    ABG No results found for: PHART,  PCO2ART, PO2ART, HCO3, TCO2, ACIDBASEDEF, O2SAT   No results found for: TSH BNP (last 3 results) No results for input(s): BNP in the last 8760 hours.  ProBNP (last 3 results)  No results for input(s): PROBNP in the last 8760 hours.  Cardiac Panel (last 3 results) No results for input(s): CKTOTAL, CKMB, TROPONINI, RELINDX in the last 72 hours.  Iron/TIBC/Ferritin/ %Sat No results found for: IRON, TIBC, FERRITIN, IRONPCTSAT   EKG Orders placed or performed during the hospital encounter of 11/08/13  . EKG 12-Lead  . EKG 12-Lead  . EKG 12-Lead in am (before 8am)  . EKG 12-Lead in am (before 8am)  . EKG     Prior Assessment and Plan Problem List as of 05/26/2014      Cardiovascular and Mediastinum   Coronary atherosclerosis of native coronary artery   Essential hypertension   NSTEMI (non-ST elevated myocardial infarction)   Syncope   Cardiomyopathy, ischemic   Chronic systolic CHF (congestive heart failure)   Chronic systolic dysfunction of left ventricle     Other   Chest pain   Hyperlipidemia   Palpitations       Imaging: No results found.

## 2014-05-27 LAB — BASIC METABOLIC PANEL
BUN: 12 mg/dL (ref 6–23)
CO2: 26 meq/L (ref 19–32)
Calcium: 10.2 mg/dL (ref 8.4–10.5)
Chloride: 102 mEq/L (ref 96–112)
Creat: 1.01 mg/dL (ref 0.50–1.10)
Glucose, Bld: 91 mg/dL (ref 70–99)
Potassium: 4.2 mEq/L (ref 3.5–5.3)
SODIUM: 137 meq/L (ref 135–145)

## 2014-05-27 LAB — MAGNESIUM: Magnesium: 1.7 mg/dL (ref 1.5–2.5)

## 2014-05-29 ENCOUNTER — Telehealth: Payer: Self-pay

## 2014-05-29 NOTE — Telephone Encounter (Signed)
Pt notified ,will start magnesium 400 mg daily

## 2014-05-29 NOTE — Telephone Encounter (Signed)
-----   Message from Jodelle Gross, NP sent at 05/29/2014  7:02 AM EDT ----- Begin magnesium 400 mg daily for low magnesium level of 1.7. Would like to keep it at 2 for CAD patients.

## 2014-05-30 ENCOUNTER — Telehealth: Payer: Self-pay | Admitting: *Deleted

## 2014-05-30 NOTE — Telephone Encounter (Signed)
Patient informed via message machine. 

## 2014-05-30 NOTE — Telephone Encounter (Signed)
Notes Recorded by Hillis Range, MD on 05/24/2014 at 4:46 PM Results reviewed. Please inform pt of result. EF has normalized with biv pacing!

## 2014-06-03 ENCOUNTER — Other Ambulatory Visit: Payer: Self-pay | Admitting: Cardiology

## 2014-06-08 ENCOUNTER — Other Ambulatory Visit: Payer: Self-pay | Admitting: Cardiology

## 2014-06-12 ENCOUNTER — Ambulatory Visit (INDEPENDENT_AMBULATORY_CARE_PROVIDER_SITE_OTHER): Payer: Medicare Other | Admitting: *Deleted

## 2014-06-12 ENCOUNTER — Encounter: Payer: Self-pay | Admitting: *Deleted

## 2014-06-12 ENCOUNTER — Encounter: Payer: Self-pay | Admitting: Internal Medicine

## 2014-06-12 DIAGNOSIS — I519 Heart disease, unspecified: Secondary | ICD-10-CM | POA: Diagnosis not present

## 2014-06-12 DIAGNOSIS — I255 Ischemic cardiomyopathy: Secondary | ICD-10-CM | POA: Diagnosis not present

## 2014-06-12 DIAGNOSIS — Z95810 Presence of automatic (implantable) cardiac defibrillator: Secondary | ICD-10-CM

## 2014-06-12 LAB — MDC_IDC_ENUM_SESS_TYPE_REMOTE
Battery Remaining Longevity: 89 mo
Battery Voltage: 2.99 V
Brady Statistic AP VS Percent: 0.24 %
Brady Statistic AS VS Percent: 1.31 %
Brady Statistic RA Percent Paced: 12.93 %
Brady Statistic RV Percent Paced: 2.71 %
Date Time Interrogation Session: 20160425122605
HIGH POWER IMPEDANCE MEASURED VALUE: 92 Ohm
Lead Channel Impedance Value: 342 Ohm
Lead Channel Impedance Value: 399 Ohm
Lead Channel Impedance Value: 456 Ohm
Lead Channel Impedance Value: 532 Ohm
Lead Channel Impedance Value: 570 Ohm
Lead Channel Impedance Value: 855 Ohm
Lead Channel Pacing Threshold Pulse Width: 0.4 ms
Lead Channel Pacing Threshold Pulse Width: 0.4 ms
Lead Channel Pacing Threshold Pulse Width: 0.8 ms
Lead Channel Setting Pacing Amplitude: 2 V
Lead Channel Setting Pacing Amplitude: 2 V
Lead Channel Setting Pacing Pulse Width: 0.8 ms
Lead Channel Setting Sensing Sensitivity: 0.3 mV
MDC IDC MSMT LEADCHNL LV IMPEDANCE VALUE: 342 Ohm
MDC IDC MSMT LEADCHNL LV IMPEDANCE VALUE: 437 Ohm
MDC IDC MSMT LEADCHNL LV IMPEDANCE VALUE: 589 Ohm
MDC IDC MSMT LEADCHNL LV IMPEDANCE VALUE: 779 Ohm
MDC IDC MSMT LEADCHNL LV IMPEDANCE VALUE: 855 Ohm
MDC IDC MSMT LEADCHNL LV PACING THRESHOLD AMPLITUDE: 1.625 V
MDC IDC MSMT LEADCHNL RA PACING THRESHOLD AMPLITUDE: 0.5 V
MDC IDC MSMT LEADCHNL RA SENSING INTR AMPL: 3.375 mV
MDC IDC MSMT LEADCHNL RV IMPEDANCE VALUE: 532 Ohm
MDC IDC MSMT LEADCHNL RV IMPEDANCE VALUE: 627 Ohm
MDC IDC MSMT LEADCHNL RV PACING THRESHOLD AMPLITUDE: 0.625 V
MDC IDC MSMT LEADCHNL RV SENSING INTR AMPL: 6.875 mV
MDC IDC SET LEADCHNL RA PACING AMPLITUDE: 2 V
MDC IDC SET LEADCHNL RV PACING PULSEWIDTH: 0.4 ms
MDC IDC SET ZONE DETECTION INTERVAL: 350 ms
MDC IDC SET ZONE DETECTION INTERVAL: 360 ms
MDC IDC STAT BRADY AP VP PERCENT: 12.69 %
MDC IDC STAT BRADY AS VP PERCENT: 85.76 %
Zone Setting Detection Interval: 300 ms
Zone Setting Detection Interval: 360 ms

## 2014-06-12 NOTE — Progress Notes (Signed)
EPIC Encounter for ICM Monitoring  Patient Name: Becky [name] is a 64 y.o. female Date: 06/12/2014 Primary Care Physican: Bedelia Person, MD Primary Cardiologist: Branch Electrophysiologist: Allred Dry Weight: 190 lbs       In the past month, have you:  1. Gained more than 2 pounds in a day or more than 5 pounds in a week? no  2. Had changes in your medications (with verification of current medications)? She has taken some decongestants for sinus congestion.  3. Had more shortness of breath than is usual for you? no  4. Limited your activity because of shortness of breath? no  5. Not been able to sleep because of shortness of breath? no  6. Had increased swelling in your feet or ankles? no  7. Had symptoms of dehydration (dizziness, dry mouth, increased thirst, decreased urine output) Per the patient's report, she has felt a little dehydrated the last couple of days. She did have an episode of palpitations on Saturday, but none since then. She has been trying to increase her water intake today.   8. Had changes in sodium restriction? no  9. Been compliant with medication? Yes   ICM trend:   Follow-up plan: ICM clinic phone appointment: 07/18/14. The patient's impedence has been above baseline from ~4/7- present. I have advised her to increase her water intake for the next 2 days. I have also advised that she hold her lasix x 1 dose tomorrow, then resume normal dosing on 06/14/14. She verbalizes understanding.  Copy of note sent to patient's primary care physician, primary cardiologist, and device following physician.  Sherri Rad, RN, BSN 06/12/2014 1:13 PM

## 2014-06-12 NOTE — Progress Notes (Signed)
Remote ICD transmission.   

## 2014-06-20 ENCOUNTER — Encounter: Payer: Self-pay | Admitting: Cardiology

## 2014-07-11 ENCOUNTER — Encounter: Payer: Self-pay | Admitting: Cardiology

## 2014-07-11 ENCOUNTER — Ambulatory Visit (INDEPENDENT_AMBULATORY_CARE_PROVIDER_SITE_OTHER): Payer: Medicare Other | Admitting: Cardiology

## 2014-07-11 VITALS — BP 124/76 | HR 68 | Ht 60.0 in | Wt 201.0 lb

## 2014-07-11 DIAGNOSIS — E785 Hyperlipidemia, unspecified: Secondary | ICD-10-CM

## 2014-07-11 DIAGNOSIS — I10 Essential (primary) hypertension: Secondary | ICD-10-CM

## 2014-07-11 DIAGNOSIS — I5022 Chronic systolic (congestive) heart failure: Secondary | ICD-10-CM | POA: Diagnosis not present

## 2014-07-11 DIAGNOSIS — I2510 Atherosclerotic heart disease of native coronary artery without angina pectoris: Secondary | ICD-10-CM

## 2014-07-11 NOTE — Progress Notes (Signed)
Clinical Summary Ms. Ikard is a 64 y.o.female seen today for follow up of the following medical problems.   1. ICM/Chronic systolic heart failure - history of multiple PCIs as described below, mainly in [place].  - 04/2013 LVEF 30-35%. Repeat echo after BiV AICD 05/2014 shows LVEF 55%, grade I diastolic dysfunction.  - 05/2014 BiV AICD check normal function, 98% biV pacing.  - medical therapy has been limited by orthostatic symtpoms - she has been on long term plavix due to her extensive stent burden  - last visit 05/26/14 seen by NP Lawerence with 4-5 lbs weight gain and increased SOB and abdominal distension. She had not been limiting her salt intake.  - weight improved since that time with short course of increased lasix. Weighs at home 2-3 times a week, stable around 200 lbs. Improved SOB - started physical therapy/water aerobics, tolerating well.  - denies any chest pain   2. OSA  - compliant with CPAP, followed by Dr Andrey Campanile  3. Hyperlipidemia - muscle aches on multiple statins, currently on zetia - no recent lipid panel in our system   4. HTN - compliant with meds Past Medical History  Diagnosis Date  . GERD (gastroesophageal reflux disease)   . Hypertension   . HLD (hyperlipidemia)   . CAD (coronary artery disease)     a. 5/08: s/p DES to PDA and DES to LAD; b. 1/09: s/p DES to pLAD; c. 1/11: s/p DES to LAD x 2, d. LHC (3/12 in [place] - EF 55%, mild ant HK, nl LM, LAD stents ok, oLAD 30, RCA stents ok;  e.  4/13:  s/p DES to RCA, f. 1/14: dLM 20-30, mOM3 20-30, mLAD 80 ISR => s/p 2.5x16 mm Promus Element DES; EF 45-50%   . LBBB (left bundle Dionta[place] Route block)   . COPD (chronic obstructive pulmonary disease)   . Ischemic cardiomyopathy     a. Echo (11/14):  EF 25%   . Fibromyalgia   . Syncope 11/14    LifeVest placed  . CHF (congestive heart failure)   . Depression   . Myocardial infarction 12/2012  . Asthma   . Chronic bronchitis   . Daily headache      "might be from sinus" (06/02/2013)  . Arthritis     "all my joints; neck, back, legs, arms, elbows" (06/02/2013)  . Chronic lower back pain     "into pelvis region" (06/02/2013)  . Anxiety      Allergies  Allergen Reactions  . Tape Rash and Other (See Comments)    Severe Reaction (latex tape): Burned hand, Redness-took days to clear up (03/02/12)  . Codeine Nausea And Vomiting  . Prozac [Fluoxetine Hcl] Other (See Comments)    Mean/violent thoughts  . Fosamax [Alendronate Sodium] Nausea Only and Other (See Comments)    Stomach pain  . Keflex [Cephalexin] Nausea Only  . Lipitor [Atorvastatin] Other (See Comments)    Leg pain     Current Outpatient Prescriptions  Medication Sig Dispense Refill  . albuterol (PROAIR HFA) 108 (90 BASE) MCG/ACT inhaler Inhale 2 puffs into the lungs every 4 (four) hours as needed for shortness of breath.     Marland Kitchen amoxicillin-clavulanate (AUGMENTIN) 875-125 MG per tablet   0  . Aspirin-Caffeine (BC FAST PAIN RELIEF PO) Take by mouth.    . Calcium Carbonate-Vit D-Min (CALCIUM 1200 PO) Take 1,200 mg by mouth daily.    . cetirizine (ZYRTEC) 10 MG tablet Take 10 mg by mouth  daily.    . Cholecalciferol (VITAMIN D) 2000 UNITS CAPS Take 2,000 Units by mouth daily.    . clopidogrel (PLAVIX) 75 MG tablet Take 75 mg by mouth daily.     Marland Kitchen escitalopram (LEXAPRO) 20 MG tablet Take 20 mg by mouth daily.     Marland Kitchen ezetimibe (ZETIA) 10 MG tablet Take 1 tablet (10 mg total) by mouth daily. 30 tablet 6  . Fluticasone-Salmeterol (ADVAIR) 500-50 MCG/DOSE AEPB Inhale 1 puff into the lungs 2 (two) times daily.    . furosemide (LASIX) 20 MG tablet Take 20 mg by mouth daily.    Marland Kitchen HYDROcodone-acetaminophen (NORCO/VICODIN) 5-325 MG per tablet Take 1 tablet by mouth every 4 (four) hours as needed (for pain).     Marland Kitchen ipratropium-albuterol (DUONEB) 0.5-2.5 (3) MG/3ML SOLN Take 3 mLs by nebulization every 6 (six) hours as needed (shortness of breath).    . lactulose (CHRONULAC) 10 GM/15ML  solution Take 10-20 g by mouth 2 (two) times daily as needed (for constipation).     Marland Kitchen lisinopril (PRINIVIL,ZESTRIL) 5 MG tablet TAKE 1 TABLET BY MOUTH EVERY DAY 30 tablet 6  . magnesium oxide (MAG-OX) 400 MG tablet Take 400 mg by mouth daily.    . meclizine (ANTIVERT) 25 MG tablet   0  . metoprolol succinate (TOPROL-XL) 200 MG 24 hr tablet Take 1 tablet (200 mg total) by mouth daily. 30 tablet 6  . montelukast (SINGULAIR) 10 MG tablet Take 10 mg by mouth every morning.     . nitroGLYCERIN (NITROSTAT) 0.4 MG SL tablet Place 1 tablet (0.4 mg total) under the tongue every 5 (five) minutes x 3 doses as needed. If no relief after 3rd dose, proceed to ED 25 tablet 11  . pantoprazole (PROTONIX) 40 MG tablet TAKE 1 TABLET BY MOUTH DAILY 30 tablet 6  . potassium chloride SA (KLOR-CON M20) 20 MEQ tablet Take 1 tablet (20 mEq total) by mouth daily. 90 tablet 3  . rOPINIRole (REQUIP) 0.5 MG tablet Take 1 mg by mouth at bedtime.     Marland Kitchen SALINE NASAL SPRAY NA Place 1-2 sprays into the nose as needed (for congestion).     . SPIRIVA HANDIHALER 18 MCG inhalation capsule Place 1 capsule into inhaler and inhale daily.    Marland Kitchen spironolactone (ALDACTONE) 25 MG tablet TAKE 1/2 TABLET BY MOUTH DAILY 15 tablet 6  . traMADol (ULTRAM) 50 MG tablet Take 100 mg by mouth 2 (two) times daily.      No current facility-administered medications for this visit.     Past Surgical History  Procedure Laterality Date  . Orif ankle fracture Right 10/21/2009  . Tubal ligation  1989  . Coronary stent placement  02/2012  . Bi-ventricular implantable cardioverter defibrillator  (crt-d)  06/02/2013    MDT VivaQuad CRTD implanted by Dr Johney Frame for ICM, CHF  . Hysteroscopy w/ endometrial ablation  ~ 1998  . Coronary angioplasty with stent placement  2007; 2009; 2011    "?2 +1 +1 "  . Cardiac catheterization  12/2012    S/P MI  . Lead revision  11-08-13    RA and LV lead revision by Dr Johney Frame  . Left heart catheterization with coronary  angiogram N/A 01/03/2013    Procedure: LEFT HEART CATHETERIZATION WITH CORONARY ANGIOGRAM;  Surgeon: Kathleene Hazel, MD;  Location[facility] CATH LAB;  Service: Cardiovascular;  Laterality: N/A;  . Bi-ventricular implantable cardioverter defibrillator N/A 06/02/2013    Procedure: BI-VENTRICULAR IMPLANTABLE CARDIOVERTER DEFIBRILLATOR  (CRT-D);  Surgeon: Fayrene Fearing  D Allred, MD;  Location: [facility] CATH LAB;  Service: Cardiovascular;  Laterality: N/A;  . Lead revision Left 11/08/2013    Procedure: LEAD REVISION;  Surgeon: Gardiner Rhyme, MD;  Location: [facility] CATH LAB;  Service: Cardiovascular;  Laterality: Left;     Allergies  Allergen Reactions  . Tape Rash and Other (See Comments)    Severe Reaction (latex tape): Burned hand, Redness-took days to clear up (03/02/12)  . Codeine Nausea And Vomiting  . Prozac [Fluoxetine Hcl] Other (See Comments)    Mean/violent thoughts  . Fosamax [Alendronate Sodium] Nausea Only and Other (See Comments)    Stomach pain  . Keflex [Cephalexin] Nausea Only  . Lipitor [Atorvastatin] Other (See Comments)    Leg pain      Family History  Problem Relation Age of Onset  . Breast cancer Mother   . Heart failure Brother   . Breast cancer Sister   . Breast cancer Sister   . Uterine cancer Sister      Social History Ms. Allis reports that she has been smoking Cigarettes.  She started smoking about 23 years ago. She has a 11.5 pack-year smoking history. She has never used smokeless tobacco. Ms. Myhre reports that she drinks alcohol.   Review of Systems CONSTITUTIONAL: No weight loss, fever, chills, weakness or fatigue.  HEENT: Eyes: No visual loss, blurred vision, double vision or yellow sclerae.No hearing loss, sneezing, congestion, runny nose or sore throat.  SKIN: No rash or itching.  CARDIOVASCULAR: per HPI RESPIRATORY: No shortness of breath, cough or sputum.  GASTROINTESTINAL: No anorexia, nausea, vomiting or diarrhea. No abdominal pain or blood.    GENITOURINARY: No burning on urination, no polyuria NEUROLOGICAL: No headache, dizziness, syncope, paralysis, ataxia, numbness or tingling in the extremities. No change in bowel or bladder control.  MUSCULOSKELETAL: No muscle, back pain, joint pain or stiffness.  LYMPHATICS: No enlarged nodes. No history of splenectomy.  PSYCHIATRIC: No history of depression or anxiety.  ENDOCRINOLOGIC: No reports of sweating, cold or heat intolerance. No polyuria or polydipsia.  Marland Kitchen   Physical Examination Filed Vitals:   07/11/14 0843  BP: 124/76  Pulse: 68   Filed Vitals:   07/11/14 0843  Height: 5' (1.524 m)  Weight: 201 lb (91.173 kg)     Gen: resting comfortably, no acute distress HEENT: no scleral icterus, pupils equal round and reactive, no palptable cervical adenopathy,  CV: RRR, no m/r/g, no JVD Resp: Clear to auscultation bilaterally GI: abdomen is soft, non-tender, non-distended, normal bowel sounds, no hepatosplenomegaly MSK: extremities are warm, no edema.  Skin: warm, no rash Neuro:  no focal deficits Psych: appropriate affect   Diagnostic Studies  04/2010 Cath [place], [place]: LVEF 55%, mild anterior hypokinesis. LM normal, LAD with stents in prox and mid portion widely patent, LAD ostial 30%. Diags with luminal irregs. LCX with luminial irregs. RCA with distal patent stents  Stent cards  May 2008 DES to PDA, DES to LAD  Jan 2009 DES to prox LAD  Mar 05 2009 DES to LAD x2  05/2011 DES to RCA  Jan 2014 stent done [place], [place]  03/2010 Echo: LVEF 40%, hypokinesis of the anteroseptum.   01/02/13 Cath  Hemodynamic Findings:  Central aortic pressure: 147/79  Left ventricular pressure: 130/22/26  Angiographic Findings:  Left main: 30% distal stenosis. It appears that the distal left main has a stent that continues into the LAD.  Left Anterior Descending Artery: Large vessel that courses to the apex. The entire proximal and  mid LAD is stented. There is mild  stent restenosis in the proximal segment. The mid stented segment has diffuse 30% stent restenosis. The distal vessel becomes small in caliber and has mild diffuse plaque. There is a moderate caliber first diagonal Trayvon Trumbull with mild plaque disease.  Circumflex Artery: Moderate caliber non-dominant vessel with three moderate caliber obtuse marginal branches. No obstructive disease.  Right Coronary Artery: Large dominant vessel with 30% proximal stenosis, heavily calcified mid vessel with patent stent in the mid vessel (no significant restenosis). The distal vessel has diffuse 40-50% stenosis. The posterolateral Jahlon Baines and PDA are moderate caliber, patent vessels with mild plaque disease.  Left Ventricular Angiogram: LVEF=25-30%. Hypokinesis of the antero-apical wall.  Impression:  1. 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 Elevated troponin following syncopal event. No culprit lesions seen.  3. Moderate to severe LV systolic dysfunction.  Recommendations: Continue medical management of CAD. Workup for syncope is underway. Carotid dopplers today. Will check d-dimer as PE is a possibility. She could also have had an arrythmia with LV dysfunction.  Complications: None. The patient tolerated the procedure well.  01/03/13 Echo  - LVEF 25-30%, akinesis of the anteroseptal and apical myocardium, grade I diastolic dysfunction,  01/03/13 Carotid US  - The vertebral arteries appear patent with antegrade flow. - Findings consistent with1- 39 percent stenosis,high end of scale,involving the right internal carotid artery. - Findings consistent with 40 - 59 percent stenosis, low end of scale,involving the left internal carotid artery. - ICA/CCA ratio. right =1.72. left = 1.52 Other specific details can be found in the table(s) above. Prepared and Electronically Authenticated by  12/13/12 Event monitor  No symptoms reported, sinus rhythm with conduction delay, occasional PVCs.   04/2013 Echo  Study Conclusions  - Left ventricle: The cavity size was normal. Wall thickness was increased in a pattern of mild LVH with moderate basal septal hypertrophy. Systolic function was moderately to severely reduced. The estimated ejection fraction was in the range of 30% to 35%. There is akinesis to dyskinesis of the mid-distal anteroseptal and apical myocardium. There is akinesis of the distalinferoseptal myocardium. Doppler parameters are consistent with abnormal left ventricular relaxation (grade 1 diastolic dysfunction). - Ventricular septum: Septal motion showed abnormal function and dyssynergy. - Aortic valve: Mildly calcified annulus. Probably trileaflet; mildly calcified leaflets. No significant regurgitation. - Mitral valve: Calcified annulus. Trivial regurgitation. - Left atrium: The atrium was mildly dilated. - Right atrium: Central venous pressure: 8mm Hg (est). - Tricuspid valve: Trivial regurgitation. - Pulmonary arteries: PA peak pressure: 20mm Hg (S). - Pericardium, extracardiac: There was no pericardial effusion. Impressions:  - MildLVH with moderate basal septal hypertrophy, LVEF 30-35% with wall motion abnormalities as outlined, grade 1 diastolic dysfunction. Mild left atrial enlagement. MAC with trivial mitral regurgitation. MIldlysclerotic aortic valve. Trivial tricuspid regurgitation with normal PASP 20 mmHg.  05/2014 echo Study Conclusions  - Left ventricle: Technically limited study. The cavity size was normal. Wall thickness was increased in a pattern of moderate LVH. The estimated ejection fraction was 55%. Doppler parameters are consistent with abnormal left ventricular relaxation (grade 1 diastolic dysfunction). - Aortic valve: Sclerosis without stenosis. - Right ventricle: The cavity size was normal. Pacer wire or catheter noted in right ventricle. Systolic function was normal. - Impressions: Since the study in 2015,  there is definite improvement in LV function.  Impressions:  - Since the study in 2015, there is definite improvement in LV function.   Assessment and Plan  1. ICM/Chronic systolic heart failure  - previous LVEF 30-35%. After BiV AICD repeat echo with normalized LVEF - medical therapy has been limited due to orthstatic symptoms, continue current meds - indefinite plavix due to large stent burden  2. OSA - continue CPAP  3. Hyperlipidemia - intolerant to statins, continue dietary and lifestyle modification - continue zetia, repeat lipid panel. Pending results may be candidate for PCSK9 inhibitor.    4. HTN -at goal, continue current meds  F/u 6 months     Antoine Poche, M.D

## 2014-07-11 NOTE — Patient Instructions (Signed)
Your physician wants you to follow-up in: 6 MONTHS WITH DR. BRANCH You will receive a reminder letter in the mail two months in advance. If you don't receive a letter, please call our office to schedule the follow-up appointment.  Your physician recommends that you continue on your current medications as directed. Please refer to the Current Medication list given to you today.  Your physician recommends that you return for lab work LIPIDS/CBC PLEASE FAST PRIOR TO LAB WORK  Thank you for choosing  [facility]!!

## 2014-07-18 ENCOUNTER — Telehealth: Payer: Self-pay | Admitting: *Deleted

## 2014-07-18 ENCOUNTER — Encounter: Payer: Self-pay | Admitting: *Deleted

## 2014-07-18 ENCOUNTER — Ambulatory Visit (INDEPENDENT_AMBULATORY_CARE_PROVIDER_SITE_OTHER): Payer: Medicare Other | Admitting: *Deleted

## 2014-07-18 DIAGNOSIS — I5022 Chronic systolic (congestive) heart failure: Secondary | ICD-10-CM

## 2014-07-18 DIAGNOSIS — Z95810 Presence of automatic (implantable) cardiac defibrillator: Secondary | ICD-10-CM

## 2014-07-18 DIAGNOSIS — I255 Ischemic cardiomyopathy: Secondary | ICD-10-CM

## 2014-07-18 NOTE — Progress Notes (Signed)
EPIC Encounter for ICM Monitoring  Patient Name: Becky [name] is a 64 y.o. female Date: 07/18/2014 Primary Care Physican: Bedelia Person, MD Primary Cardiologist: Branch Electrophysiologist: Allred Dry Weight: 190 lbs       In the past month, have you:  1. Gained more than 2 pounds in a day or more than 5 pounds in a week? no  2. Had changes in your medications (with verification of current medications)? no  3. Had more shortness of breath than is usual for you? no  4. Limited your activity because of shortness of breath? no  5. Not been able to sleep because of shortness of breath? no  6. Had increased swelling in your feet or ankles? no  7. Had symptoms of dehydration (dizziness, dry mouth, increased thirst, decreased urine output) no  8. Had changes in sodium restriction? no  9. Been compliant with medication? Yes   ICM trend:   Follow-up plan: ICM clinic phone appointment: 08/22/14 The patient is doing well from a cardiac standpoint per her report. Last month she was slightly dehydrated. She has been drinking extra water this month if she feels like she may be getting this way. I have advised her to continue to monitor her symptoms and if she continues to have periods where she feels dehydrated, then we may need to check with Dr. Wyline Mood and see if her lasix can be decreased to every other day. I will recheck her optivol readings in 1 month.  Copy of note sent to patient's primary care physician, primary cardiologist, and device following physician.  Sherri Rad, RN, BSN 07/18/2014 4:51 PM

## 2014-07-18 NOTE — Telephone Encounter (Signed)
ICM transmission received. I left a message for the patient to call. 

## 2014-07-18 NOTE — Telephone Encounter (Signed)
I spoke with the patient. 

## 2014-08-22 ENCOUNTER — Encounter: Payer: Self-pay | Admitting: *Deleted

## 2014-08-22 ENCOUNTER — Ambulatory Visit (INDEPENDENT_AMBULATORY_CARE_PROVIDER_SITE_OTHER): Payer: Medicare Other | Admitting: *Deleted

## 2014-08-22 DIAGNOSIS — I5022 Chronic systolic (congestive) heart failure: Secondary | ICD-10-CM | POA: Diagnosis not present

## 2014-08-22 DIAGNOSIS — Z95810 Presence of automatic (implantable) cardiac defibrillator: Secondary | ICD-10-CM

## 2014-08-24 ENCOUNTER — Encounter: Payer: Self-pay | Admitting: *Deleted

## 2014-08-24 NOTE — Progress Notes (Signed)
EPIC Encounter for ICM Monitoring  Patient Name: Becky [name] is a 64 y.o. female Date: 08/24/2014 Primary Care Physican: Bedelia Person, MD Primary Cardiologist: Branch  Electrophysiologist: Allred Dry Weight: 190 lbs       In the past month, have you:  1. Gained more than 2 pounds in a day or more than 5 pounds in a week? no  2. Had changes in your medications (with verification of current medications)? no  3. Had more shortness of breath than is usual for you? no  4. Limited your activity because of shortness of breath? no  5. Not been able to sleep because of shortness of breath? no  6. Had increased swelling in your feet or ankles? no  7. Had symptoms of dehydration (dizziness, dry mouth, increased thirst, decreased urine output) no  8. Had changes in sodium restriction? no  9. Been compliant with medication? Yes   ICM trend:   Follow-up plan: ICM clinic phone appointment: 09/25/14. The patient's optivol readings were slightly elevated from ~ 6/14-present. Impedence does appear to be approaching baseline at present. The patient is with out symptoms at the present time. No changes made today.  Copy of note sent to patient's primary care physician, primary cardiologist, and device following physician.  Sherri Rad, RN, BSN 08/24/2014 2:20 PM

## 2014-08-31 ENCOUNTER — Encounter: Payer: Self-pay | Admitting: *Deleted

## 2014-09-01 ENCOUNTER — Other Ambulatory Visit: Payer: Self-pay | Admitting: Cardiology

## 2014-09-25 ENCOUNTER — Encounter: Payer: Self-pay | Admitting: Internal Medicine

## 2014-09-25 ENCOUNTER — Ambulatory Visit (INDEPENDENT_AMBULATORY_CARE_PROVIDER_SITE_OTHER): Payer: Medicare Other | Admitting: *Deleted

## 2014-09-25 DIAGNOSIS — I255 Ischemic cardiomyopathy: Secondary | ICD-10-CM | POA: Diagnosis not present

## 2014-09-25 NOTE — Progress Notes (Signed)
Remote ICD transmission.   

## 2014-09-27 NOTE — Progress Notes (Addendum)
EPIC Encounter for ICM Monitoring  Patient Name: Becky [name] is a 64 y.o. female Date: 09/27/2014 Primary Care Physican: Bedelia Person, MD Primary Cardiologist: Branch Electrophysiologist: Allred Dry Weight: 197 lbs    BiV Pacing is 97.9%      In the past month, have you:  1. Gained more than 2 pounds in a day or more than 5 pounds in a week? no  2. Had changes in your medications (with verification of current medications)? no  3. Had more shortness of breath than is usual for you? no  4. Limited your activity because of shortness of breath? no  5. Not been able to sleep because of shortness of breath? no  6. Had increased swelling in your feet or ankles? no  7. Had symptoms of dehydration (dizziness, dry mouth, increased thirst, decreased urine output) no  8. Had changes in sodium restriction? no  9. Been compliant with medication? Yes   ICM trend:   Follow-up plan: ICM clinic phone appointment 10/31/2014.  Optivol fluid index slightly elevated and thoracic impedance slightly below reference line during July 29-31but trending toward base line August 6.  Member reported SOB but stated it resolves after taking allergy med and inhaler.  She has a 7lb weight gain since 08/22/2014 per ICM call.  She stated she her legs have been hurting and she will make appointment with PCP but denied any swelling.  She reported she is compliant with limiting salt intake.   Advised to call for weight gain of more than 2 pounds in a day or more than 5 pounds a week, swelling in legs/abdomen, and/or shortness of breath.        No changes made today.   Copy of note sent to patient's primary care physician, primary cardiologist, and device following physician.  Karie Soda, RN, CCM 09/27/2014 11:38 AM

## 2014-10-03 LAB — CUP PACEART REMOTE DEVICE CHECK
Brady Statistic AP VP Percent: 65.67 %
Brady Statistic AP VS Percent: 1.15 %
Brady Statistic AS VP Percent: 32.59 %
Brady Statistic RA Percent Paced: 66.82 %
Date Time Interrogation Session: 20160808052403
HighPow Impedance: 84 Ohm
Lead Channel Impedance Value: 342 Ohm
Lead Channel Impedance Value: 380 Ohm
Lead Channel Impedance Value: 494 Ohm
Lead Channel Impedance Value: 513 Ohm
Lead Channel Impedance Value: 570 Ohm
Lead Channel Impedance Value: 760 Ohm
Lead Channel Pacing Threshold Amplitude: 0.375 V
Lead Channel Pacing Threshold Pulse Width: 0.4 ms
Lead Channel Sensing Intrinsic Amplitude: 7.125 mV
Lead Channel Setting Pacing Amplitude: 2 V
Lead Channel Setting Pacing Amplitude: 2 V
Lead Channel Setting Pacing Pulse Width: 0.8 ms
Lead Channel Setting Sensing Sensitivity: 0.3 mV
MDC IDC MSMT BATTERY REMAINING LONGEVITY: 81 mo
MDC IDC MSMT BATTERY VOLTAGE: 2.98 V
MDC IDC MSMT LEADCHNL LV IMPEDANCE VALUE: 323 Ohm
MDC IDC MSMT LEADCHNL LV IMPEDANCE VALUE: 342 Ohm
MDC IDC MSMT LEADCHNL LV IMPEDANCE VALUE: 342 Ohm
MDC IDC MSMT LEADCHNL LV IMPEDANCE VALUE: 494 Ohm
MDC IDC MSMT LEADCHNL LV IMPEDANCE VALUE: 703 Ohm
MDC IDC MSMT LEADCHNL LV IMPEDANCE VALUE: 703 Ohm
MDC IDC MSMT LEADCHNL LV PACING THRESHOLD AMPLITUDE: 1.75 V
MDC IDC MSMT LEADCHNL LV PACING THRESHOLD PULSEWIDTH: 0.8 ms
MDC IDC MSMT LEADCHNL RA IMPEDANCE VALUE: 456 Ohm
MDC IDC MSMT LEADCHNL RA PACING THRESHOLD AMPLITUDE: 0.625 V
MDC IDC MSMT LEADCHNL RA SENSING INTR AMPL: 3.25 mV
MDC IDC MSMT LEADCHNL RA SENSING INTR AMPL: 3.25 mV
MDC IDC MSMT LEADCHNL RV PACING THRESHOLD PULSEWIDTH: 0.4 ms
MDC IDC MSMT LEADCHNL RV SENSING INTR AMPL: 7.125 mV
MDC IDC SET LEADCHNL RA PACING AMPLITUDE: 2 V
MDC IDC SET LEADCHNL RV PACING PULSEWIDTH: 0.4 ms
MDC IDC SET ZONE DETECTION INTERVAL: 300 ms
MDC IDC SET ZONE DETECTION INTERVAL: 350 ms
MDC IDC STAT BRADY AS VS PERCENT: 0.59 %
MDC IDC STAT BRADY RV PERCENT PACED: 40.38 %
Zone Setting Detection Interval: 360 ms
Zone Setting Detection Interval: 360 ms

## 2014-10-10 ENCOUNTER — Encounter: Payer: Self-pay | Admitting: Cardiology

## 2014-11-02 ENCOUNTER — Telehealth: Payer: Self-pay

## 2014-11-02 NOTE — Telephone Encounter (Signed)
Follow up ° ° ° ° °Returning a nurses call °

## 2014-11-02 NOTE — Telephone Encounter (Signed)
ICM transmission received.  Attempted call to patient and left message for return call. 

## 2014-11-02 NOTE — Telephone Encounter (Signed)
Received voice mail message from patient stating she was returning the call.  Attempted call back to patient and left voice mail.

## 2014-11-03 NOTE — Telephone Encounter (Signed)
Attempted ICM follow up call and left message for return call.

## 2014-11-13 NOTE — Telephone Encounter (Signed)
Unable to reach patient for ICM follow up.   Next ICM transmission scheduled for 12/25/2014.

## 2014-11-25 IMAGING — CR DG CHEST 2V
2 series · 2 of 2 positions shown · non-contrast
Comparison: 06/03/2013

CLINICAL DATA: Evaluate ICD placement. Ischemic cardiomyopathy.
Hypertension.

EXAM:
CHEST  2 VIEW

[view not recorded (1 of 2)]
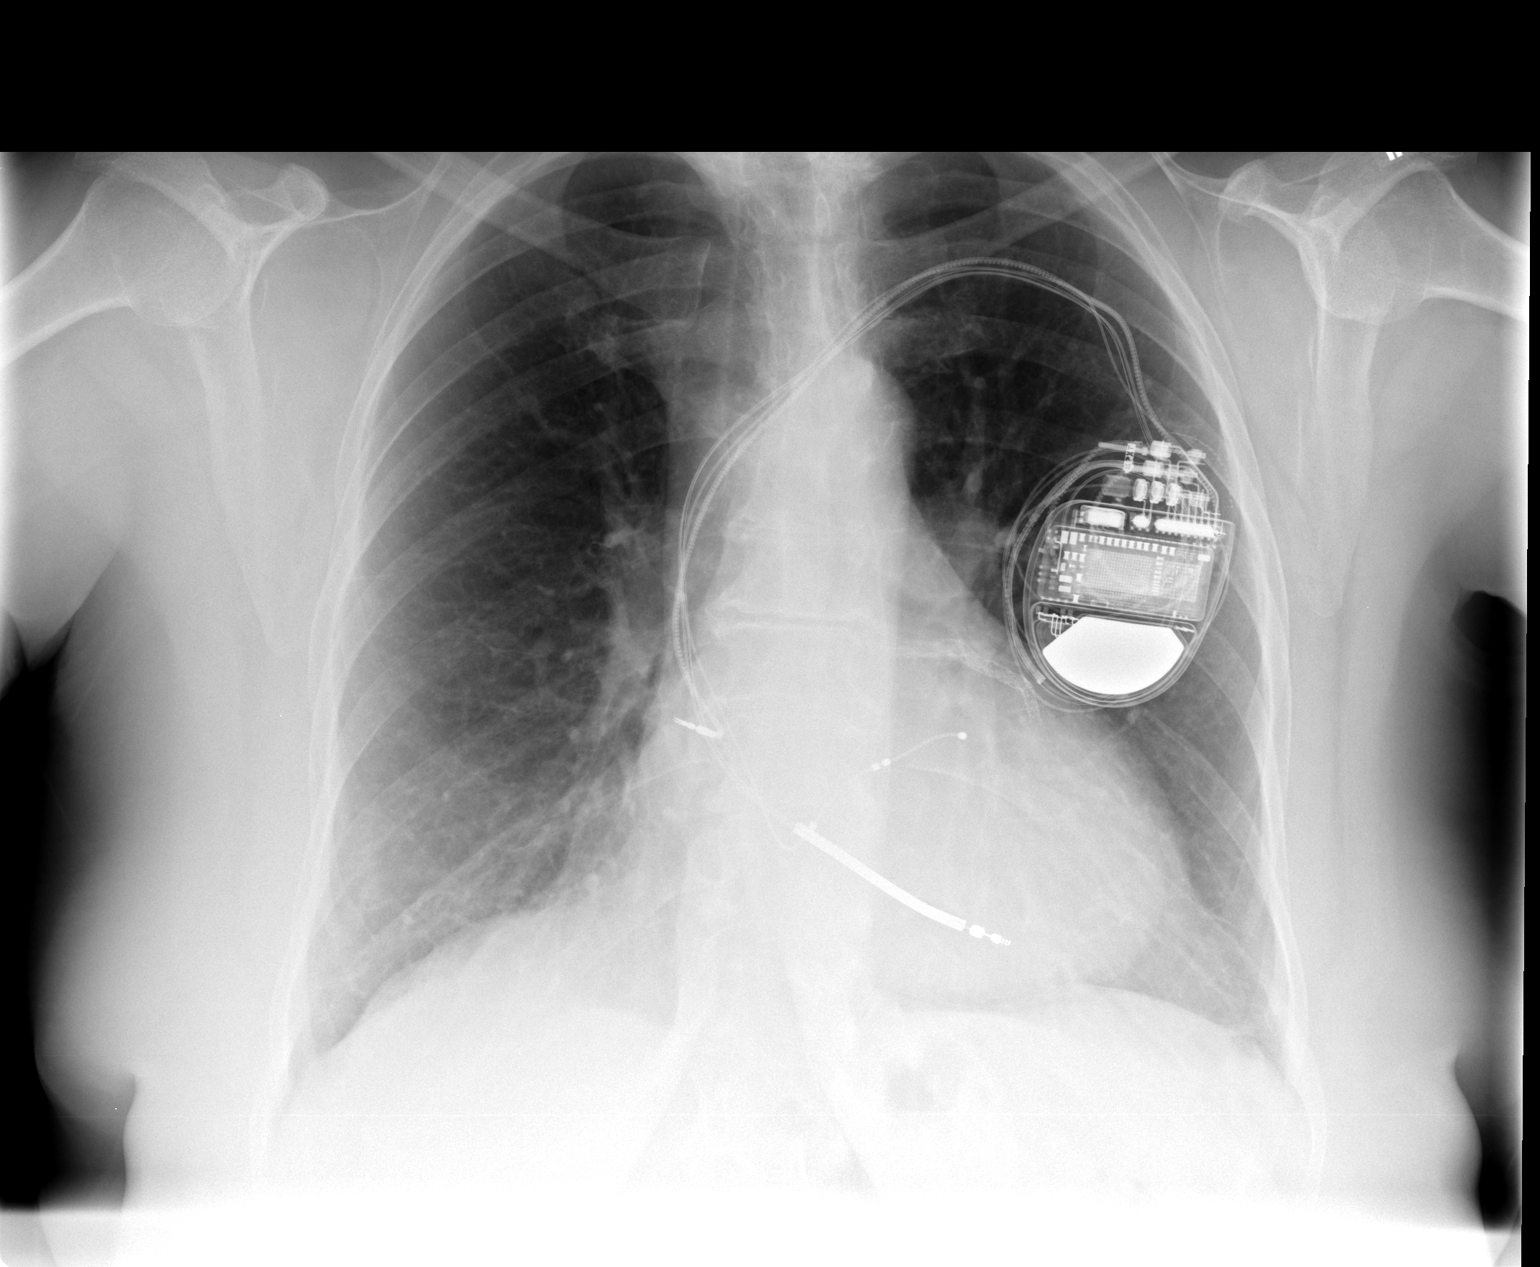

[view not recorded (2 of 2)]
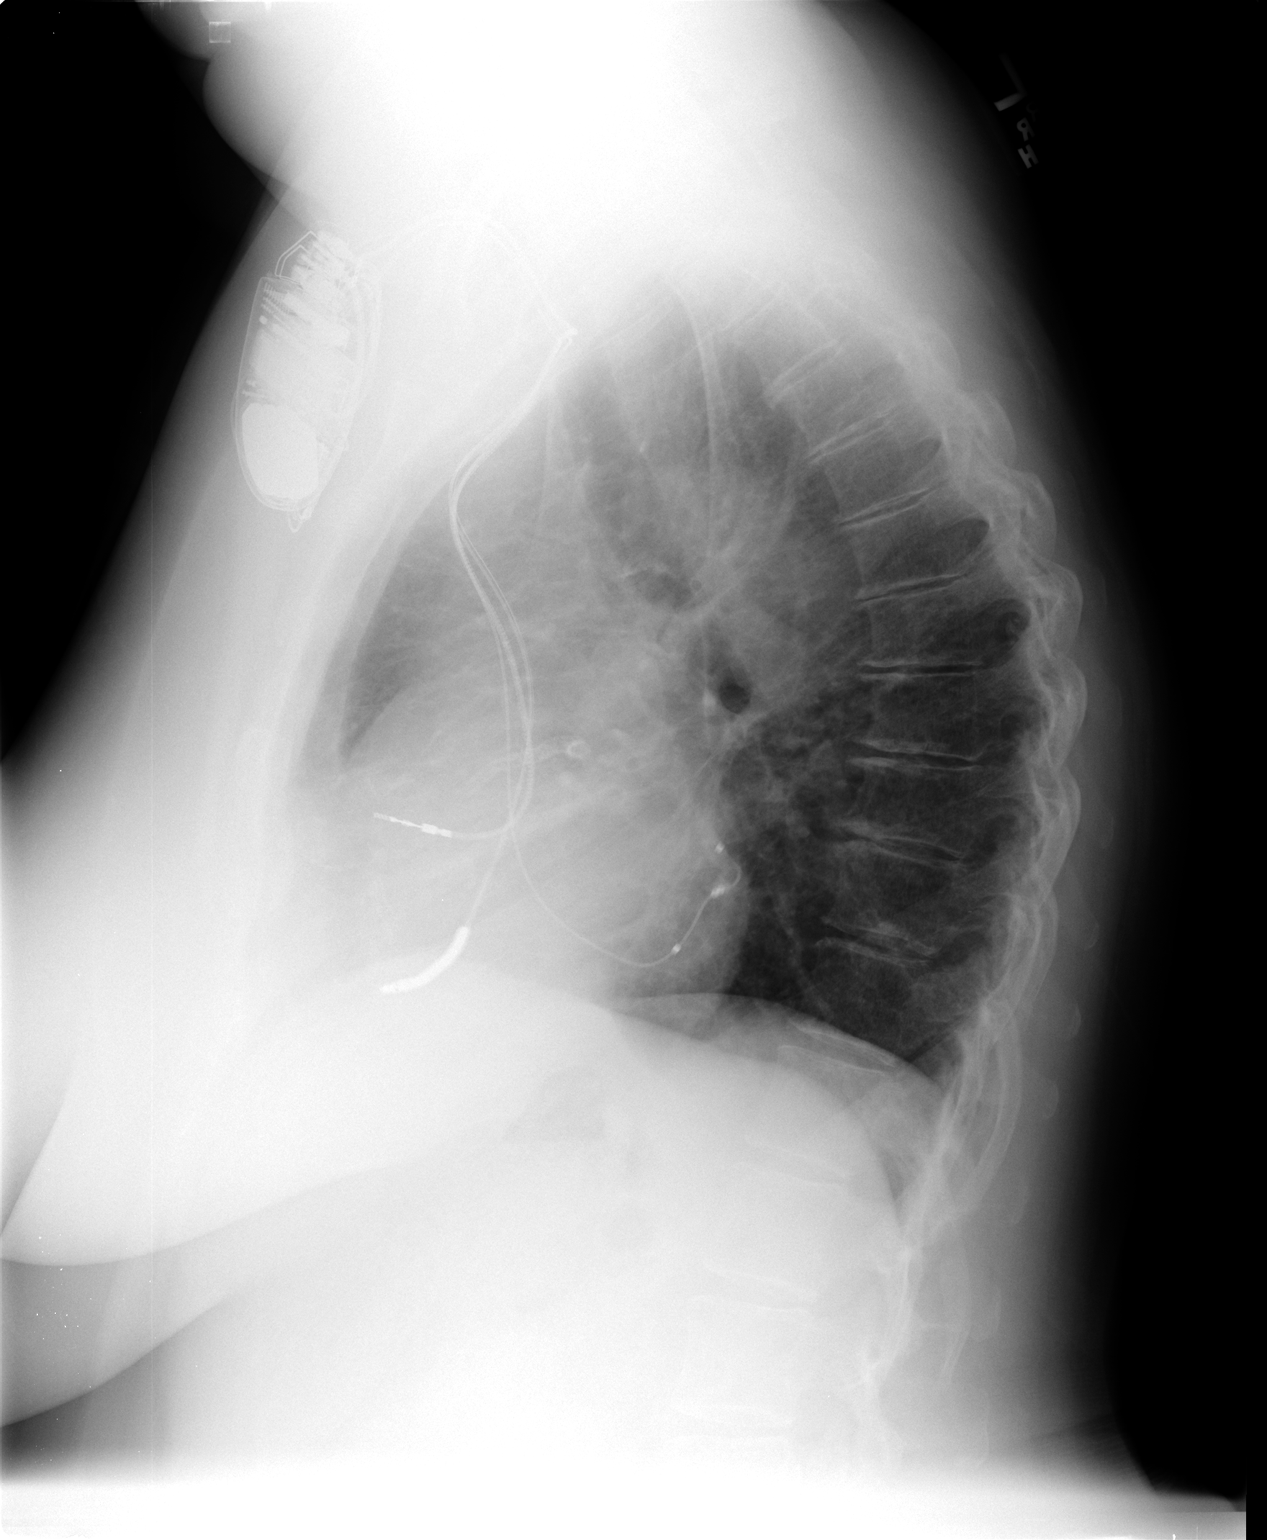

[2 of 2 positions shown; findings below may reference images not displayed]

FINDINGS: There are 3 pacer leads, 1 projecting in the right atrium, another
in the right ventricle and a third in the left atrium.

Cardiac silhouette is mildly enlarged. Stable left coronary artery
stent. Normal mediastinal and hilar contours.

Lungs are clear.  No pleural effusion.  No pneumothorax.

Bony thorax is intact.
IMPRESSION: No acute cardiopulmonary disease.

## 2014-12-04 ENCOUNTER — Other Ambulatory Visit: Payer: Self-pay | Admitting: Cardiology

## 2014-12-25 ENCOUNTER — Ambulatory Visit (INDEPENDENT_AMBULATORY_CARE_PROVIDER_SITE_OTHER): Payer: Medicare Other | Admitting: *Deleted

## 2014-12-25 DIAGNOSIS — I5022 Chronic systolic (congestive) heart failure: Secondary | ICD-10-CM

## 2014-12-25 DIAGNOSIS — Z95810 Presence of automatic (implantable) cardiac defibrillator: Secondary | ICD-10-CM | POA: Diagnosis not present

## 2014-12-25 DIAGNOSIS — I255 Ischemic cardiomyopathy: Secondary | ICD-10-CM | POA: Diagnosis not present

## 2014-12-26 NOTE — Progress Notes (Signed)
Remote ICD transmission.   

## 2014-12-27 ENCOUNTER — Encounter: Payer: Self-pay | Admitting: Cardiology

## 2014-12-27 LAB — CUP PACEART REMOTE DEVICE CHECK
Battery Voltage: 2.98 V
Brady Statistic AP VS Percent: 0.7 %
Brady Statistic AS VS Percent: 1.08 %
HighPow Impedance: 78 Ohm
Implantable Lead Implant Date: 20150416
Implantable Lead Implant Date: 20150922
Implantable Lead Location: 753860
Implantable Lead Model: 5076
Implantable Lead Model: 6935
Lead Channel Impedance Value: 342 Ohm
Lead Channel Impedance Value: 437 Ohm
Lead Channel Impedance Value: 494 Ohm
Lead Channel Impedance Value: 532 Ohm
Lead Channel Impedance Value: 589 Ohm
Lead Channel Impedance Value: 589 Ohm
Lead Channel Impedance Value: 779 Ohm
Lead Channel Impedance Value: 779 Ohm
Lead Channel Impedance Value: 779 Ohm
Lead Channel Pacing Threshold Amplitude: 0.5 V
Lead Channel Pacing Threshold Amplitude: 0.625 V
Lead Channel Pacing Threshold Amplitude: 2 V
Lead Channel Pacing Threshold Pulse Width: 0.4 ms
Lead Channel Pacing Threshold Pulse Width: 0.8 ms
Lead Channel Sensing Intrinsic Amplitude: 2.625 mV
Lead Channel Sensing Intrinsic Amplitude: 6.625 mV
Lead Channel Setting Pacing Amplitude: 2 V
Lead Channel Setting Pacing Pulse Width: 0.8 ms
Lead Channel Setting Sensing Sensitivity: 0.3 mV
MDC IDC LEAD IMPLANT DT: 20150922
MDC IDC LEAD LOCATION: 753858
MDC IDC LEAD LOCATION: 753859
MDC IDC LEAD MODEL: 4598
MDC IDC MSMT BATTERY REMAINING LONGEVITY: 79 mo
MDC IDC MSMT LEADCHNL LV IMPEDANCE VALUE: 342 Ohm
MDC IDC MSMT LEADCHNL LV IMPEDANCE VALUE: 380 Ohm
MDC IDC MSMT LEADCHNL LV IMPEDANCE VALUE: 532 Ohm
MDC IDC MSMT LEADCHNL RA IMPEDANCE VALUE: 399 Ohm
MDC IDC MSMT LEADCHNL RA SENSING INTR AMPL: 2.625 mV
MDC IDC MSMT LEADCHNL RV PACING THRESHOLD PULSEWIDTH: 0.4 ms
MDC IDC MSMT LEADCHNL RV SENSING INTR AMPL: 6.625 mV
MDC IDC SESS DTM: 20161107083424
MDC IDC SET LEADCHNL LV PACING AMPLITUDE: 2 V
MDC IDC STAT BRADY AP VP PERCENT: 39 %
MDC IDC STAT BRADY AS VP PERCENT: 59.22 %
MDC IDC STAT BRADY RA PERCENT PACED: 39.7 %
MDC IDC STAT BRADY RV PERCENT PACED: 19.09 %

## 2014-12-28 ENCOUNTER — Telehealth: Payer: Self-pay

## 2014-12-28 NOTE — Telephone Encounter (Signed)
Spoke with patient.

## 2014-12-28 NOTE — Progress Notes (Signed)
EPIC Encounter for ICM Monitoring  Patient Name: Becky [name] is a 64 y.o. female Date: 12/28/2014 Primary Care Physican: Bedelia Person, MD Primary Cardiologist: Branch Electrophysiologist: Allred Dry Weight: 193 lbs       Bi-V Pacing 97.9%  In the past month, have you:  1. Gained more than 2 pounds in a day or more than 5 pounds in a week? Yes, in October she had weight gain.   2. Had changes in your medications (with verification of current medications)? no  3. Had more shortness of breath than is usual for you? no  4. Limited your activity because of shortness of breath? no  5. Not been able to sleep because of shortness of breath? no  6. Had increased swelling in your feet or ankles? no  7. Had symptoms of dehydration (dizziness, dry mouth, increased thirst, decreased urine output) no  8. Had changes in sodium restriction? no  9. Been compliant with medication? Yes   ICM trend:  12/25/2014   Follow-up plan: ICM clinic phone appointment on 01/30/2015 and appointment with Dr Johney Frame 02/23/2015.  Optivol daily thoracic impedance below baseline ~ 11/13/2014 to 12/16/2014 and 12/20/2014 to 12/25/2014 suggesting fluid retention.  She stated she had HF symptoms during October which included weight gain, leg/feet swelling and SOB.  She stated she was told by Dr Wyline Mood she could take extra Furosemide dosages for symptoms and she doubled the dose of Furosemide x 3 days and symptoms resolved.  She stated she is not feeling as well today and she may take an extra one today.  She has appointment with Dr Wyline Mood tomorrow and will discuss the HF symptoms she experienced in the last month.  Encouraged her to confirm the guidelines with Dr Wyline Mood regarding taking extra Furosemide dosages for symptoms.  Education given on limiting sodium intake to 2000mg  daily and fluid intake 64 oz daily.  Explained making lifestyle change may decrease the fluid retention.  Encouraged her to read labels of foods  she typically eats and add the sodium amounts to get an idea of amount she is consuming per day.   Reference material on Low Sodium Eating Plan mailed to patient.  Advised to call office if she is having HF symptoms.    Copy of note sent to patient's primary care physician, primary cardiologist, and device following physician.  Karie Soda, RN, CCM 12/28/2014 1:35 PM

## 2014-12-28 NOTE — Telephone Encounter (Signed)
ICM transmission received.  Attempted call to patient and left message to return call.

## 2014-12-29 ENCOUNTER — Ambulatory Visit (INDEPENDENT_AMBULATORY_CARE_PROVIDER_SITE_OTHER): Payer: Medicare Other | Admitting: Cardiology

## 2014-12-29 ENCOUNTER — Encounter: Payer: Self-pay | Admitting: Cardiology

## 2014-12-29 VITALS — BP 142/80 | HR 62 | Ht 60.0 in | Wt 206.0 lb

## 2014-12-29 DIAGNOSIS — I10 Essential (primary) hypertension: Secondary | ICD-10-CM

## 2014-12-29 DIAGNOSIS — I5022 Chronic systolic (congestive) heart failure: Secondary | ICD-10-CM | POA: Diagnosis not present

## 2014-12-29 DIAGNOSIS — E785 Hyperlipidemia, unspecified: Secondary | ICD-10-CM

## 2014-12-29 MED ORDER — PRAVASTATIN SODIUM 20 MG PO TABS: ORAL_TABLET | ORAL | Status: DC

## 2014-12-29 MED ORDER — PRAVASTATIN SODIUM 20 MG PO TABS: 20.0000 mg | ORAL_TABLET | Freq: Every evening | ORAL | Status: DC

## 2014-12-29 MED ORDER — NITROGLYCERIN 0.4 MG SL SUBL: 0.4000 mg | SUBLINGUAL_TABLET | SUBLINGUAL | Status: AC | PRN

## 2014-12-29 MED ORDER — FUROSEMIDE 40 MG PO TABS: ORAL_TABLET | ORAL | Status: DC

## 2014-12-29 NOTE — Progress Notes (Signed)
Patient ID: Becky [name], female   DOB: Sep 29, 1950, 64 y.o.   MRN: [id number]     Clinical Summary Becky [name] is a 64 y.o.female seen today for follow up of the following medical problems.  1. ICM/Chronic systolic heart failure - history of multiple PCIs as described below, mainly in [place].  - 04/2013 LVEF 30-35%. Repeat echo after BiV AICD 05/2014 shows LVEF 55%, grade I diastolic dysfunction.  - 12/2014 BiV AICD check normal function - medical therapy has been limited by orthostatic symtpoms - she has been on long term plavix due to her extensive stent burden  - denies any chest pain - home weights inconsistent per her report, she states she has a "cheap scale". Increased swelling over the last few weeks. She admits to some dietary sodium indiscretions.    2. OSA  - compliant with CPAP  3. Hyperlipidemia - muscle aches on multiple statins incluing on pravatastatin and lovastatin in the past, currently on zetia - 07/2014 TC 207 TG 242 HDL 41 LDL 118 - insurance no longer covers zetia, she is asking to stop  4. HTN - compliant with meds, but has not taken yet today   Past Medical History  Diagnosis Date  . GERD (gastroesophageal reflux disease)   . Hypertension   . HLD (hyperlipidemia)   . CAD (coronary artery disease)     a. 5/08: s/p DES to PDA and DES to LAD; b. 1/09: s/p DES to pLAD; c. 1/11: s/p DES to LAD x 2, d. LHC (3/12 in [place] - EF 55%, mild ant HK, nl LM, LAD stents ok, oLAD 30, RCA stents ok;  e.  4/13:  s/p DES to RCA, f. 1/14: dLM 20-30, mOM3 20-30, mLAD 80 ISR => s/p 2.5x16 mm Promus Element DES; EF 45-50%   . LBBB (left bundle Dalonda Simoni block)   . COPD (chronic obstructive pulmonary disease)   . Ischemic cardiomyopathy     a. Echo (11/14):  EF 25%   . Fibromyalgia   . Syncope 11/14    LifeVest placed  . CHF (congestive heart failure)   . Depression   . Myocardial infarction (HCC) 12/2012  . Asthma   . Chronic bronchitis   . Daily  headache     "might be from sinus" (06/02/2013)  . Arthritis     "all my joints; neck, back, legs, arms, elbows" (06/02/2013)  . Chronic lower back pain     "into pelvis region" (06/02/2013)  . Anxiety      Allergies  Allergen Reactions  . Tape Rash and Other (See Comments)    Severe Reaction (latex tape): Burned hand, Redness-took days to clear up (03/02/12)  . Codeine Nausea And Vomiting  . Prozac [Fluoxetine Hcl] Other (See Comments)    Mean/violent thoughts  . Fosamax [Alendronate Sodium] Nausea Only and Other (See Comments)    Stomach pain  . Keflex [Cephalexin] Nausea Only  . Lipitor [Atorvastatin] Other (See Comments)    Leg pain     Current Outpatient Prescriptions  Medication Sig Dispense Refill  . albuterol (PROAIR HFA) 108 (90 BASE) MCG/ACT inhaler Inhale 2 puffs into the lungs every 4 (four) hours as needed for shortness of breath.     Marland Kitchen aspirin 81 MG tablet Take 81 mg by mouth daily.    . Aspirin-Caffeine (BC FAST PAIN RELIEF PO) Take by mouth.    . Calcium Carbonate-Vit D-Min (CALCIUM 1200 PO) Take 1,200 mg by mouth daily.    . cetirizine (ZYRTEC)  10 MG tablet Take 10 mg by mouth daily.    . Cholecalciferol (VITAMIN D) 2000 UNITS CAPS Take 2,000 Units by mouth daily.    . clopidogrel (PLAVIX) 75 MG tablet Take 75 mg by mouth daily.     Marland Kitchen escitalopram (LEXAPRO) 20 MG tablet Take 20 mg by mouth daily.     . Fluticasone-Salmeterol (ADVAIR) 500-50 MCG/DOSE AEPB Inhale 1 puff into the lungs 2 (two) times daily.    . furosemide (LASIX) 20 MG tablet Take 20 mg by mouth daily.    Marland Kitchen GENERLAC 10 GM/15ML SOLN Take 15 mLs by mouth daily as needed.  5  . HYDROcodone-acetaminophen (NORCO/VICODIN) 5-325 MG per tablet Take 1 tablet by mouth every 4 (four) hours as needed (for pain).     Marland Kitchen ipratropium-albuterol (DUONEB) 0.5-2.5 (3) MG/3ML SOLN Take 3 mLs by nebulization every 6 (six) hours as needed (shortness of breath).    . lactulose (CHRONULAC) 10 GM/15ML solution Take 10-20 g by  mouth 2 (two) times daily as needed (for constipation).     Marland Kitchen lisinopril (PRINIVIL,ZESTRIL) 5 MG tablet TAKE 1 TABLET BY MOUTH EVERY DAY 30 tablet 6  . magnesium oxide (MAG-OX) 400 MG tablet Take 400 mg by mouth daily.    . meclizine (ANTIVERT) 25 MG tablet   0  . metoprolol (TOPROL-XL) 200 MG 24 hr tablet TAKE 1 TABLET BY MOUTH DAILY(DOSE INCREASE) 30 tablet 6  . montelukast (SINGULAIR) 10 MG tablet Take 10 mg by mouth every morning.     . nitroGLYCERIN (NITROSTAT) 0.4 MG SL tablet Place 1 tablet (0.4 mg total) under the tongue every 5 (five) minutes x 3 doses as needed. If no relief after 3rd dose, proceed to ED 25 tablet 11  . pantoprazole (PROTONIX) 40 MG tablet TAKE 1 TABLET BY MOUTH DAILY 30 tablet 6  . potassium chloride SA (KLOR-CON M20) 20 MEQ tablet Take 1 tablet (20 mEq total) by mouth daily. 90 tablet 3  . rOPINIRole (REQUIP) 0.5 MG tablet Take 1 mg by mouth at bedtime.     Marland Kitchen SALINE NASAL SPRAY NA Place 1-2 sprays into the nose as needed (for congestion).     . SPIRIVA HANDIHALER 18 MCG inhalation capsule Place 1 capsule into inhaler and inhale daily.    Marland Kitchen spironolactone (ALDACTONE) 25 MG tablet TAKE 1/2 TABLET BY MOUTH DAILY 15 tablet 6  . traMADol (ULTRAM) 50 MG tablet Take 100 mg by mouth 2 (two) times daily.     Marland Kitchen ZETIA 10 MG tablet TAKE 1 TABLET BY MOUTH DAILY 30 tablet 6   No current facility-administered medications for this visit.     Past Surgical History  Procedure Laterality Date  . Orif ankle fracture Right 10/21/2009  . Tubal ligation  1989  . Coronary stent placement  02/2012  . Bi-ventricular implantable cardioverter defibrillator  (crt-d)  06/02/2013    MDT VivaQuad CRTD implanted by Dr Johney Frame for ICM, CHF  . Hysteroscopy w/ endometrial ablation  ~ 1998  . Coronary angioplasty with stent placement  2007; 2009; 2011    "?2 +1 +1 "  . Cardiac catheterization  12/2012    S/P MI  . Lead revision  11-08-13    RA and LV lead revision by Dr Johney Frame  . Left heart  catheterization with coronary angiogram N/A 01/03/2013    Procedure: LEFT HEART CATHETERIZATION WITH CORONARY ANGIOGRAM;  Surgeon: Kathleene Hazel, MD;  Location: [facility] CATH LAB;  Service: Cardiovascular;  Laterality: N/A;  . Bi-ventricular implantable  cardioverter defibrillator N/A 06/02/2013    Procedure: BI-VENTRICULAR IMPLANTABLE CARDIOVERTER DEFIBRILLATOR  (CRT-D);  Surgeon: Gardiner Rhyme, MD;  Location: [facility] CATH LAB;  Service: Cardiovascular;  Laterality: N/A;  . Lead revision Left 11/08/2013    Procedure: LEAD REVISION;  Surgeon: Gardiner Rhyme, MD;  Location: [facility] CATH LAB;  Service: Cardiovascular;  Laterality: Left;     Allergies  Allergen Reactions  . Tape Rash and Other (See Comments)    Severe Reaction (latex tape): Burned hand, Redness-took days to clear up (03/02/12)  . Codeine Nausea And Vomiting  . Prozac [Fluoxetine Hcl] Other (See Comments)    Mean/violent thoughts  . Fosamax [Alendronate Sodium] Nausea Only and Other (See Comments)    Stomach pain  . Keflex [Cephalexin] Nausea Only  . Lipitor [Atorvastatin] Other (See Comments)    Leg pain      Family History  Problem Relation Age of Onset  . Breast cancer Mother   . Heart failure Brother   . Breast cancer Sister   . Breast cancer Sister   . Uterine cancer Sister      Social History Becky [name] reports that she has been smoking Cigarettes.  She started smoking about 23 years ago. She has a 11.5 pack-year smoking history. She has never used smokeless tobacco. Becky [name] reports that she drinks alcohol.   Review of Systems CONSTITUTIONAL: No weight loss, fever, chills, weakness or fatigue.  HEENT: Eyes: No visual loss, blurred vision, double vision or yellow sclerae.No hearing loss, sneezing, congestion, runny nose or sore throat.  SKIN: No rash or itching.  CARDIOVASCULAR: per HPI RESPIRATORY: No cough or sputum.  GASTROINTESTINAL: No anorexia, nausea, vomiting or diarrhea. No abdominal pain or blood.    GENITOURINARY: No burning on urination, no polyuria NEUROLOGICAL: No headache, dizziness, syncope, paralysis, ataxia, numbness or tingling in the extremities. No change in bowel or bladder control.  MUSCULOSKELETAL: No muscle, back pain, joint pain or stiffness.  LYMPHATICS: No enlarged nodes. No history of splenectomy.  PSYCHIATRIC: No history of depression or anxiety.  ENDOCRINOLOGIC: No reports of sweating, cold or heat intolerance. No polyuria or polydipsia.  Marland Kitchen   Physical Examination Filed Vitals:   12/29/14 0836  BP: 142/80  Pulse: 62   Filed Vitals:   12/29/14 0836  Height: 5' (1.524 m)  Weight: 206 lb (93.441 kg)    Gen: resting comfortably, no acute distress HEENT: no scleral icterus, pupils equal round and reactive, no palptable cervical adenopathy,  CV: RRR, no m/r/g, no jvd Resp: Clear to auscultation bilaterally GI: abdomen is soft, non-tender, non-distended, normal bowel sounds, no hepatosplenomegaly MSK: extremities are warm,1+ bilateral edema Skin: warm, no rash Neuro:  no focal deficits Psych: appropriate affect   Diagnostic Studies  04/2010 Cath [place], [place]: LVEF 55%, mild anterior hypokinesis. LM normal, LAD with stents in prox and mid portion widely patent, LAD ostial 30%. Diags with luminal irregs. LCX with luminial irregs. RCA with distal patent stents  Stent cards  May 2008 DES to PDA, DES to LAD  Jan 2009 DES to prox LAD  Mar 05 2009 DES to LAD x2  05/2011 DES to RCA  Jan 2014 stent done [place], [place]  03/2010 Echo: LVEF 40%, hypokinesis of the anteroseptum.   01/02/13 Cath  Hemodynamic Findings:  Central aortic pressure: 147/79  Left ventricular pressure: 130/22/26  Angiographic Findings:  Left main: 30% distal stenosis. It appears that the distal left main has a stent that continues into the LAD.  Left Anterior Descending  Artery: Large vessel that courses to the apex. The entire proximal and mid LAD is stented. There is  mild stent restenosis in the proximal segment. The mid stented segment has diffuse 30% stent restenosis. The distal vessel becomes small in caliber and has mild diffuse plaque. There is a moderate caliber first diagonal Chauntel Windsor with mild plaque disease.  Circumflex Artery: Moderate caliber non-dominant vessel with three moderate caliber obtuse marginal branches. No obstructive disease.  Right Coronary Artery: Large dominant vessel with 30% proximal stenosis, heavily calcified mid vessel with patent stent in the mid vessel (no significant restenosis). The distal vessel has diffuse 40-50% stenosis. The posterolateral Dyshawn Cangelosi and PDA are moderate caliber, patent vessels with mild plaque disease.  Left Ventricular Angiogram: LVEF=25-30%. Hypokinesis of the antero-apical wall.  Impression:  1. 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 Elevated troponin following syncopal event. No culprit lesions seen.  3. Moderate to severe LV systolic dysfunction.  Recommendations: Continue medical management of CAD. Workup for syncope is underway. Carotid dopplers today. Will check d-dimer as PE is a possibility. She could also have had an arrythmia with LV dysfunction.  Complications: None. The patient tolerated the procedure well.  01/03/13 Echo  - LVEF 25-30%, akinesis of the anteroseptal and apical myocardium, grade I diastolic dysfunction,  01/03/13 Carotid US  - The vertebral arteries appear patent with antegrade flow. - Findings consistent with1- 39 percent stenosis,high end of scale,involving the right internal carotid artery. - Findings consistent with 40 - 59 percent stenosis, low end of scale,involving the left internal carotid artery. - ICA/CCA ratio. right =1.72. left = 1.52 Other specific details can be found in the table(s) above. Prepared and Electronically Authenticated by  12/13/12 Event monitor  No symptoms reported, sinus rhythm with conduction delay, occasional  PVCs.  04/2013 Echo  Study Conclusions  - Left ventricle: The cavity size was normal. Wall thickness was increased in a pattern of mild LVH with moderate basal septal hypertrophy. Systolic function was moderately to severely reduced. The estimated ejection fraction was in the range of 30% to 35%. There is akinesis to dyskinesis of the mid-distal anteroseptal and apical myocardium. There is akinesis of the distalinferoseptal myocardium. Doppler parameters are consistent with abnormal left ventricular relaxation (grade 1 diastolic dysfunction). - Ventricular septum: Septal motion showed abnormal function and dyssynergy. - Aortic valve: Mildly calcified annulus. Probably trileaflet; mildly calcified leaflets. No significant regurgitation. - Mitral valve: Calcified annulus. Trivial regurgitation. - Left atrium: The atrium was mildly dilated. - Right atrium: Central venous pressure: 8mm Hg (est). - Tricuspid valve: Trivial regurgitation. - Pulmonary arteries: PA peak pressure: 20mm Hg (S). - Pericardium, extracardiac: There was no pericardial effusion. Impressions:  - MildLVH with moderate basal septal hypertrophy, LVEF 30-35% with wall motion abnormalities as outlined, grade 1 diastolic dysfunction. Mild left atrial enlagement. MAC with trivial mitral regurgitation. MIldlysclerotic aortic valve. Trivial tricuspid regurgitation with normal PASP 20 mmHg.  05/2014 echo Study Conclusions  - Left ventricle: Technically limited study. The cavity size was normal. Wall thickness was increased in a pattern of moderate LVH. The estimated ejection fraction was 55%. Doppler parameters are consistent with abnormal left ventricular relaxation (grade 1 diastolic dysfunction). - Aortic valve: Sclerosis without stenosis. - Right ventricle: The cavity size was normal. Pacer wire or catheter noted in right ventricle. Systolic function was normal. - Impressions: Since the study in  2015, there is definite improvement in LV function.  Impressions:  - Since the study in 2015, there  is definite improvement in LV function.    Assessment and Plan  1. ICM/Chronic systolic heart failure  - previous LVEF 30-35%. After BiV AICD repeat echo with normalized LVEF - medical therapy has been limited due to orthstatic symptoms, continue current meds - indefinite plavix due to large stent burden - recent swelling, we will increase her lasix to  daily. Check BMET and Mg in 2 weeks.  - counseled on sodium restriction  2. OSA - continue CPAP  3. Hyperlipidemia - intolerant to statins due to muscle aches. Zetia is no long covered by her insurance - we will try pravastatin  every other day to see if she can tolerate.   4. HTN -upper end of goal in clinic, she has not taken her meds yet today. Continue to follow  F/u 4 months      Becky [name], Becky [name]

## 2014-12-29 NOTE — Patient Instructions (Addendum)
Your physician recommends that you schedule a follow-up appointment in: 4 MONTHS WITH DR. BRANCH  Your physician has recommended you make the following change in your medication:   INCREASE LASIX 40 MG 1-2 TABS DAILY  STOP ZETIA   START PRAVASTATIN 20 MG EVERY OTHER DAY  Your physician recommends that you return for lab work in: 2 WEEKS BMP/MG  Thank you for choosing [facility]!!

## 2015-01-04 ENCOUNTER — Other Ambulatory Visit: Payer: Self-pay | Admitting: Cardiology

## 2015-01-16 ENCOUNTER — Encounter: Payer: Self-pay | Admitting: *Deleted

## 2015-01-19 ENCOUNTER — Telehealth: Payer: Self-pay | Admitting: *Deleted

## 2015-01-19 MED ORDER — POTASSIUM CHLORIDE CRYS ER 20 MEQ PO TBCR: 20.0000 meq | EXTENDED_RELEASE_TABLET | ORAL | Status: DC

## 2015-01-19 NOTE — Telephone Encounter (Signed)
-----   Message from Antoine Poche, MD sent at 01/19/2015  4:28 PM EST ----- Labs look ok. Potassium is a little high. Confirm she is takign KCl daily and if so change to every other day.  Dominga Ferry MD

## 2015-01-19 NOTE — Telephone Encounter (Signed)
Pt verbalized understanding of potassium to qod. Medication list updated

## 2015-01-31 ENCOUNTER — Telehealth: Payer: Self-pay

## 2015-01-31 NOTE — Telephone Encounter (Signed)
ICM transmission received.  Attempted call and left message for return call.  

## 2015-02-06 ENCOUNTER — Telehealth: Payer: Self-pay | Admitting: Internal Medicine

## 2015-02-06 ENCOUNTER — Ambulatory Visit (INDEPENDENT_AMBULATORY_CARE_PROVIDER_SITE_OTHER): Payer: Medicare Other

## 2015-02-06 DIAGNOSIS — I5022 Chronic systolic (congestive) heart failure: Secondary | ICD-10-CM | POA: Diagnosis not present

## 2015-02-06 DIAGNOSIS — Z95810 Presence of automatic (implantable) cardiac defibrillator: Secondary | ICD-10-CM | POA: Diagnosis not present

## 2015-02-06 NOTE — Telephone Encounter (Signed)
ICM call to patient.  She stated she had not been feeling well today and took extra fluid pill.  Requested to send new transmission today since the one from 01/30/2015 would not show update.  She will manually send transmission.   Voice mail message from patient stating she was returning my call.

## 2015-02-06 NOTE — Progress Notes (Signed)
EPIC Encounter for ICM Monitoring  Patient Name: Becky [name] is a 64 y.o. female Date: 02/06/2015 Primary Care Physican: Bedelia Person, MD Primary Cardiologist: Branch Electrophysiologist: Allred Dry Weight: 191 lb       In the past month, have you:  1. Gained more than 2 pounds in a day or more than 5 pounds in a week? no  2. Had changes in your medications (with verification of current medications)? no  3. Had more shortness of breath than is usual for you? no  4. Limited your activity because of shortness of breath? no  5. Not been able to sleep because of shortness of breath? no  6. Had increased swelling in your feet or ankles? no  7. Had symptoms of dehydration (dizziness, dry mouth, increased thirst, decreased urine output) no  8. Had changes in sodium restriction? no  9. Been compliant with medication? Yes   ICM trend: 1 year view   ICM Trend:  3 month view   Follow-up plan: ICM clinic phone appointment on 02/15/2015.  Optivol thoracic impedance below baseline ~01/05/2015 to 01/15/2015, 01/23/2015 to 02/06/2015 suggesting fluid retention.  She reported SOB, little ankle swelling and slight weight gain.   She stated she has already taken an extra 1/2 tab of Furosemide today.  She reported she normally takes Furosemide 1 tablet 40 mg daily but can take up to 2 tabs if needed.   Advised to take Furosemide 40 mg bid x 2 days and then resume 40 mg daily.  Will repeat ICM transmission on 02/15/2015.  She was glad we spoke today and able to review transmission since she was not feeling well.  Reminded her to follow low salt diet.    Last BUN and Creatinine were within normal limits on 01/15/2015 and potassium was 5.2.     Advised will send to Dr Wyline Mood and Dr Johney Frame for review and if any further recommendations, will call her back.   Copy of note sent to patient's primary care physician, primary cardiologist, and device following physician.  Karie Soda, RN,  CCM 02/06/2015 4:26 PM

## 2015-02-06 NOTE — Telephone Encounter (Signed)
Unable to reach patient for ICM follow up from transmission sent on 01/30/2015.  Impedance below baseline ~12/25/2014 to 11/11/25016, 01/05/2015 to 01/14/2015, 01/22/2015 to 01/30/2015 suggesting fluid retention.  Patient letter sent with new ICM transmission date, 02/25/2015, and to call if she has any symptoms.  She has in office appointment with Dr Johney Frame on 02/23/2015.    FYI for Dr Johney Frame and Dr Wyline Mood

## 2015-02-06 NOTE — Telephone Encounter (Signed)
F/u   Pt returning Laurie phone call. Please call back and discuss.   

## 2015-02-07 NOTE — Telephone Encounter (Signed)
Spoke with patient.

## 2015-02-08 NOTE — Progress Notes (Addendum)
Device fluid readings  Received: Today    Antoine Poche, MD  Karie Soda, RN            Thanks Jacki Cones,  I agree with your assessment and plan regarding two days of increased lasix. We will see what her repeat check shows next week.   Dominga Ferry MD

## 2015-02-15 ENCOUNTER — Ambulatory Visit (INDEPENDENT_AMBULATORY_CARE_PROVIDER_SITE_OTHER): Payer: Medicare Other

## 2015-02-15 DIAGNOSIS — Z95810 Presence of automatic (implantable) cardiac defibrillator: Secondary | ICD-10-CM

## 2015-02-15 DIAGNOSIS — I5022 Chronic systolic (congestive) heart failure: Secondary | ICD-10-CM

## 2015-02-16 ENCOUNTER — Telehealth: Payer: Self-pay

## 2015-02-16 NOTE — Progress Notes (Addendum)
EPIC Encounter for ICM Monitoring  Patient Name: Becky [name] is a 64 y.o. female Date: 02/16/2015 Primary Care Physican: Bedelia Person, MD Primary Cardiologist: Branch Electrophysiologist: Allred Dry Weight: 191 lbs  Bi-V Pacing 97.9%       In the past month, have you:  1. Gained more than 2 pounds in a day or more than 5 pounds in a week? no  2. Had changes in your medications (with verification of current medications)? no  3. Had more shortness of breath than is usual for you? no  4. Limited your activity because of shortness of breath? no  5. Not been able to sleep because of shortness of breath? no  6. Had increased swelling in your feet or ankles? no  7. Had symptoms of dehydration (dizziness, dry mouth, increased thirst, decreased urine output) no  8. Had changes in sodium restriction? no  9. Been compliant with medication? Yes   ICM trend: 02/15/2015 - 1 year view  ICM Trend:  3 month view    Follow-up plan: ICM clinic phone appointment 03/28/2015 and office appointment with Dr Johney Frame on 02/23/2015.  Repeat transmission showed Optivol thoracic impedance has improved since taking extra Furosemide dosage 01/2015.  Impedance above baseline and along baseline suggesting fluid levels are more balanced.  She stated she is feeling so much better.  Advised to call should she have any HF symptoms before next transmission.  Advised Dr Johney Frame will do a device check on 02/23/2015.  No changes today.   Copy of note sent to patient's primary care physician, primary cardiologist, and device following physician.  Karie Soda, RN, CCM 02/16/2015 12:09 PM   Hillis Range, MD   Sent: Caleen Essex February 16, 2015 3:57 PM    To: Karie Soda, RN        Message     Randie Heinz work!

## 2015-02-16 NOTE — Telephone Encounter (Signed)
Repeat ICM transmission received.  Attempted patient call and left message to return call.

## 2015-02-16 NOTE — Telephone Encounter (Signed)
Spoke with patient.

## 2015-02-23 ENCOUNTER — Encounter: Payer: Self-pay | Admitting: Internal Medicine

## 2015-02-23 ENCOUNTER — Ambulatory Visit (INDEPENDENT_AMBULATORY_CARE_PROVIDER_SITE_OTHER): Payer: Medicare Other | Admitting: Internal Medicine

## 2015-02-23 VITALS — BP 138/78 | HR 60 | Ht 60.0 in | Wt 203.0 lb

## 2015-02-23 DIAGNOSIS — I5022 Chronic systolic (congestive) heart failure: Secondary | ICD-10-CM

## 2015-02-23 DIAGNOSIS — Z95810 Presence of automatic (implantable) cardiac defibrillator: Secondary | ICD-10-CM | POA: Diagnosis not present

## 2015-02-23 LAB — CUP PACEART INCLINIC DEVICE CHECK
Brady Statistic AP VP Percent: 39.5 %
Brady Statistic AP VS Percent: 0.71 %
Brady Statistic AS VP Percent: 58.79 %
Brady Statistic AS VS Percent: 1 %
Brady Statistic RA Percent Paced: 40.21 %
Brady Statistic RV Percent Paced: 22.95 %
HighPow Impedance: 73 Ohm
Implantable Lead Implant Date: 20150922
Implantable Lead Implant Date: 20150922
Implantable Lead Location: 753859
Implantable Lead Model: 5076
Lead Channel Impedance Value: 304 Ohm
Lead Channel Impedance Value: 323 Ohm
Lead Channel Impedance Value: 323 Ohm
Lead Channel Impedance Value: 342 Ohm
Lead Channel Impedance Value: 399 Ohm
Lead Channel Impedance Value: 456 Ohm
Lead Channel Impedance Value: 532 Ohm
Lead Channel Impedance Value: 627 Ohm
Lead Channel Pacing Threshold Amplitude: 0.75 V
Lead Channel Pacing Threshold Amplitude: 2 V
Lead Channel Pacing Threshold Pulse Width: 0.4 ms
Lead Channel Sensing Intrinsic Amplitude: 8.5 mV
Lead Channel Setting Pacing Amplitude: 2 V
Lead Channel Setting Pacing Amplitude: 3 V
Lead Channel Setting Sensing Sensitivity: 0.3 mV
MDC IDC LEAD IMPLANT DT: 20150416
MDC IDC LEAD LOCATION: 753858
MDC IDC LEAD LOCATION: 753860
MDC IDC LEAD MODEL: 4598
MDC IDC LEAD MODEL: 6935
MDC IDC MSMT BATTERY REMAINING LONGEVITY: 52 mo
MDC IDC MSMT BATTERY VOLTAGE: 2.98 V
MDC IDC MSMT LEADCHNL LV IMPEDANCE VALUE: 342 Ohm
MDC IDC MSMT LEADCHNL LV IMPEDANCE VALUE: 513 Ohm
MDC IDC MSMT LEADCHNL LV IMPEDANCE VALUE: 627 Ohm
MDC IDC MSMT LEADCHNL LV IMPEDANCE VALUE: 665 Ohm
MDC IDC MSMT LEADCHNL LV PACING THRESHOLD PULSEWIDTH: 0.8 ms
MDC IDC MSMT LEADCHNL RA PACING THRESHOLD AMPLITUDE: 0.75 V
MDC IDC MSMT LEADCHNL RA PACING THRESHOLD PULSEWIDTH: 0.4 ms
MDC IDC MSMT LEADCHNL RA SENSING INTR AMPL: 3.5 mV
MDC IDC MSMT LEADCHNL RV IMPEDANCE VALUE: 399 Ohm
MDC IDC SESS DTM: 20170106103425
MDC IDC SET LEADCHNL LV PACING PULSEWIDTH: 0.8 ms

## 2015-02-23 NOTE — Progress Notes (Signed)
PCP: Bedelia Person, MD Primary Cardiologist:  Dr Crisoforo Oxford Becky [name] is a 65 y.o. female who presents today for electrophysiology followup. Since her last visit, she has done very well.  She is pleased with her current health state.  Her SOB and exercise tolerance are much improved.  EF recovered by echo 4/16 (reviewed with her today). Today she denies CP, dizziness, presyncope, syncope or other issues.   Past Medical History  Diagnosis Date  . GERD (gastroesophageal reflux disease)   . Hypertension   . HLD (hyperlipidemia)   . CAD (coronary artery disease)     a. 5/08: s/p DES to PDA and DES to LAD; b. 1/09: s/p DES to pLAD; c. 1/11: s/p DES to LAD x 2, d. LHC (3/12 in [place] - EF 55%, mild ant HK, nl LM, LAD stents ok, oLAD 30, RCA stents ok;  e.  4/13:  s/p DES to RCA, f. 1/14: dLM 20-30, mOM3 20-30, mLAD 80 ISR => s/p 2.5x16 mm Promus Element DES; EF 45-50%   . LBBB (left bundle branch block)   . COPD (chronic obstructive pulmonary disease) (HCC)   . Ischemic cardiomyopathy     a. Echo (11/14):  EF 25%   . Fibromyalgia   . Syncope 11/14    LifeVest placed  . CHF (congestive heart failure) (HCC)   . Depression   . Myocardial infarction (HCC) 12/2012  . Asthma   . Chronic bronchitis (HCC)   . Daily headache     "might be from sinus" (06/02/2013)  . Arthritis     "all my joints; neck, back, legs, arms, elbows" (06/02/2013)  . Chronic lower back pain     "into pelvis region" (06/02/2013)  . Anxiety    Past Surgical History  Procedure Laterality Date  . Orif ankle fracture Right 10/21/2009  . Tubal ligation  1989  . Coronary stent placement  02/2012  . Bi-ventricular implantable cardioverter defibrillator  (crt-d)  06/02/2013    MDT VivaQuad CRTD implanted by Dr Johney Frame for ICM, CHF  . Hysteroscopy w/ endometrial ablation  ~ 1998  . Coronary angioplasty with stent placement  2007; 2009; 2011    "?2 +1 +1 "  . Cardiac catheterization  12/2012    S/P MI  . Lead revision   11-08-13    RA and LV lead revision by Dr Johney Frame  . Left heart catheterization with coronary angiogram N/A 01/03/2013    Procedure: LEFT HEART CATHETERIZATION WITH CORONARY ANGIOGRAM;  Surgeon: Kathleene Hazel, MD;  Location[facility] C[facility] LAB;  Service: Cardiovascular;  Laterality: N/A;  . Bi-ventricular implantable cardioverter defibrillator N/A 06/02/2013    Procedure: BI-VENTRICULAR IMPLANTABLE CARDIOVERTER DEFIBRILLATOR  (CRT-D);  Surgeon: Gardiner Rhyme, MD;  Location: [facility] CATH LAB;  Service: Cardiovascular;  Laterality: N/A;  . Lead revision Left 11/08/2013    Procedure: LEAD REVISION;  Surgeon: Gardiner Rhyme, MD;  Location: [facility] CATH LAB;  Service: Cardiovascular;  Laterality: Left;    Current Outpatient Prescriptions  Medication Sig Dispense Refill  . albuterol (PROAIR HFA) 108 (90 BASE) MCG/ACT inhaler Inhale 2 puffs into the lungs every 4 (four) hours as needed for shortness of breath.     Marland Kitchen aspirin 81 MG tablet Take 81 mg by mouth daily.    . cetirizine (ZYRTEC) 10 MG tablet Take 10 mg by mouth daily.    . Cholecalciferol (VITAMIN D) 2000 UNITS CAPS Take 2,000 Units by mouth daily.    . clopidogrel (PLAVIX) 75 MG tablet Take  75 mg by mouth daily.     Marland Kitchen escitalopram (LEXAPRO) 20 MG tablet Take 20 mg by mouth daily.     . furosemide (LASIX) 40 MG tablet TAKE 1-2 TABS DAILY 90 tablet 3  . GENERLAC 10 GM/15ML SOLN Take 15 mLs by mouth daily as needed.  5  . HYDROcodone-acetaminophen (NORCO/VICODIN) 5-325 MG per tablet Take 1 tablet by mouth every 4 (four) hours as needed (for pain).     Marland Kitchen ipratropium-albuterol (DUONEB) 0.5-2.5 (3) MG/3ML SOLN Take 3 mLs by nebulization every 6 (six) hours as needed (shortness of breath).    Marland Kitchen lisinopril (PRINIVIL,ZESTRIL) 5 MG tablet TAKE 1 TABLET BY MOUTH EVERY DAY 30 tablet 11  . magnesium oxide (MAG-OX) 400 MG tablet Take 400 mg by mouth daily.    . meclizine (ANTIVERT) 25 MG tablet   0  . metoprolol (TOPROL-XL) 200 MG 24 hr tablet TAKE 1 TABLET BY  MOUTH DAILY(DOSE INCREASE) 30 tablet 6  . montelukast (SINGULAIR) 10 MG tablet Take 10 mg by mouth every morning.     . nitroGLYCERIN (NITROSTAT) 0.4 MG SL tablet Place 1 tablet (0.4 mg total) under the tongue every 5 (five) minutes x 3 doses as needed. If no relief after 3rd dose, proceed to ED 25 tablet 5  . pantoprazole (PROTONIX) 40 MG tablet TAKE 1 TABLET BY MOUTH DAILY 30 tablet 6  . potassium chloride SA (K-DUR,KLOR-CON) 20 MEQ tablet Take 1 tablet (20 mEq total) by mouth every other day. 45 tablet 3  . pravastatin (PRAVACHOL) 20 MG tablet TAKE 1 TAB EVERY OTHER DAY 45 tablet 3  . rOPINIRole (REQUIP) 0.5 MG tablet Take 1 mg by mouth at bedtime.     Marland Kitchen SALINE NASAL SPRAY NA Place 1-2 sprays into the nose as needed (for congestion).     . SPIRIVA HANDIHALER 18 MCG inhalation capsule Place 1 capsule into inhaler and inhale daily.    Marland Kitchen spironolactone (ALDACTONE) 25 MG tablet TAKE 1/2 TABLET BY MOUTH DAILY 15 tablet 11  . traMADol (ULTRAM) 50 MG tablet Take 100 mg by mouth 2 (two) times daily.      No current facility-administered medications for this visit.    Physical Exam: Filed Vitals:   02/23/15 0837  BP: 138/78  Pulse: 60  Height: 5' (1.524 m)  Weight: 203 lb (92.08 kg)  SpO2: 94%    GEN- The patient is overweight appearing, alert and oriented x 3 today.   Head- normocephalic, atraumatic Eyes-  Sclera clear, conjunctiva pink Ears- hearing intact Oropharynx- clear Lungs- Clear to ausculation bilaterally, normal work of breathing Chest- ICD pocket is well healed Heart- Regular rate and rhythm, no murmurs, rubs or gallops, PMI not laterally displaced GI- soft, NT, ND, + BS Extremities- no clubbing, cyanosis, or edema  ICD interrogation- reviewed in detail today,  See PACEART report  Assessment and Plan:  1.  Chronic systolic dysfunction/ Ischemic CM euvolemic today Stable on an appropriate medical regimen Normal ICD function See Pace Art report No changes  today Followed in ICM device clinic  2. Obesity Body mass index is 39.65 kg/(m^2). I am very concerned by her weight Lifestyle modification and regular exercise were strongly encouraged today  carelink Return to see me in 1 year Agree with Dr Wyline Mood that repeat echo in 3 months would be ideal to assess response to CRT.   Hillis Range MD, [facility] 02/23/2015 9:21 AM

## 2015-02-23 NOTE — Patient Instructions (Signed)
Your physician recommends that you continue on your current medications as directed. Please refer to the Current Medication list given to you today. Device check on 05/28/15. Your physician recommends that you schedule a follow-up appointment in 1 year. You can schedule this appointment today or you can wait for your letter to come in the mail in about 10 months reminding you to call and schedule this appointment. If you do not receive this letter, please contact our office for your appointment.  

## 2015-03-22 IMAGING — CR DG CHEST 2V
2 series · 2 of 2 positions shown · non-contrast
Comparison: July 15, 2013

CLINICAL DATA: Cardiac arrhythmia

EXAM:
CHEST  2 VIEW

[w chest pa]
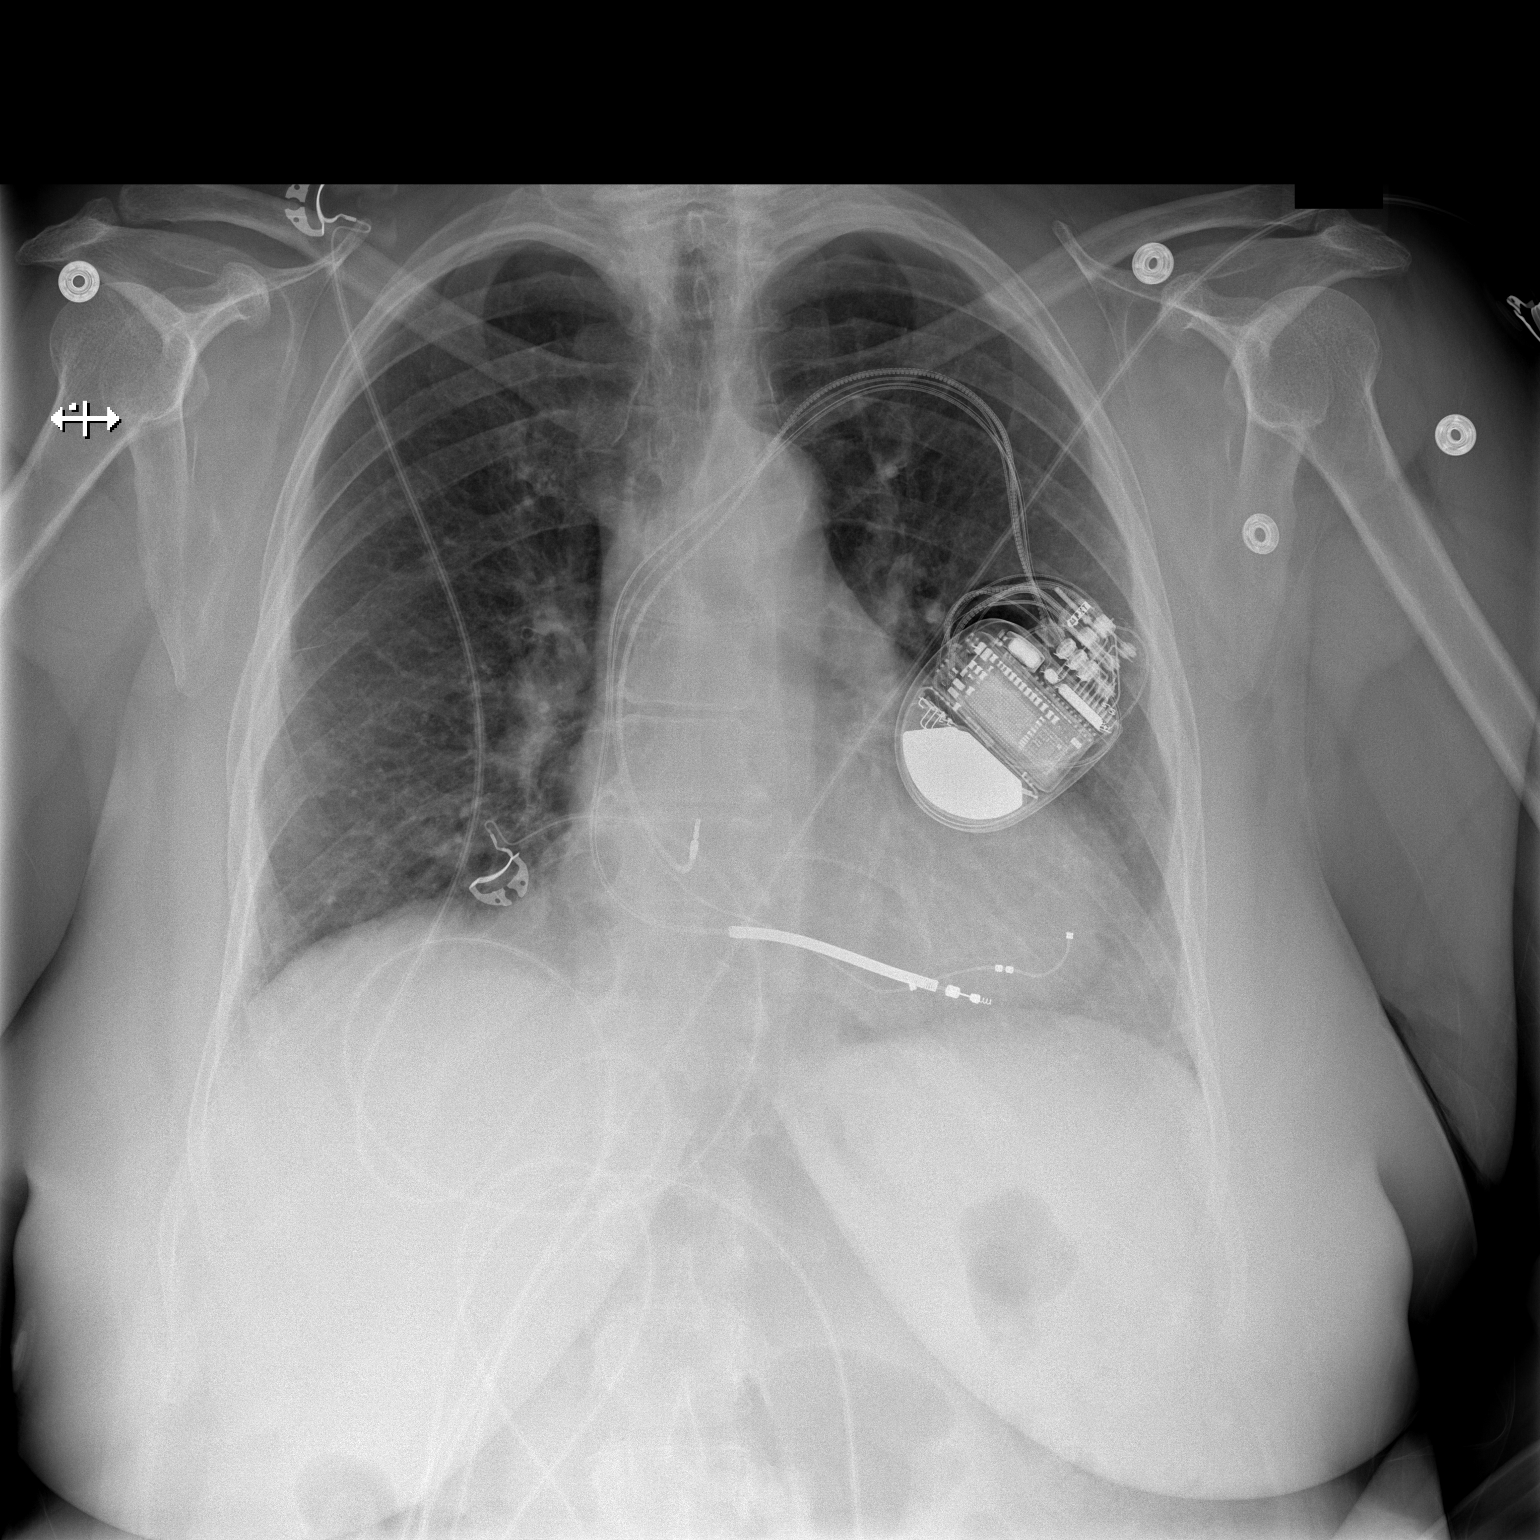

[w chest lat]
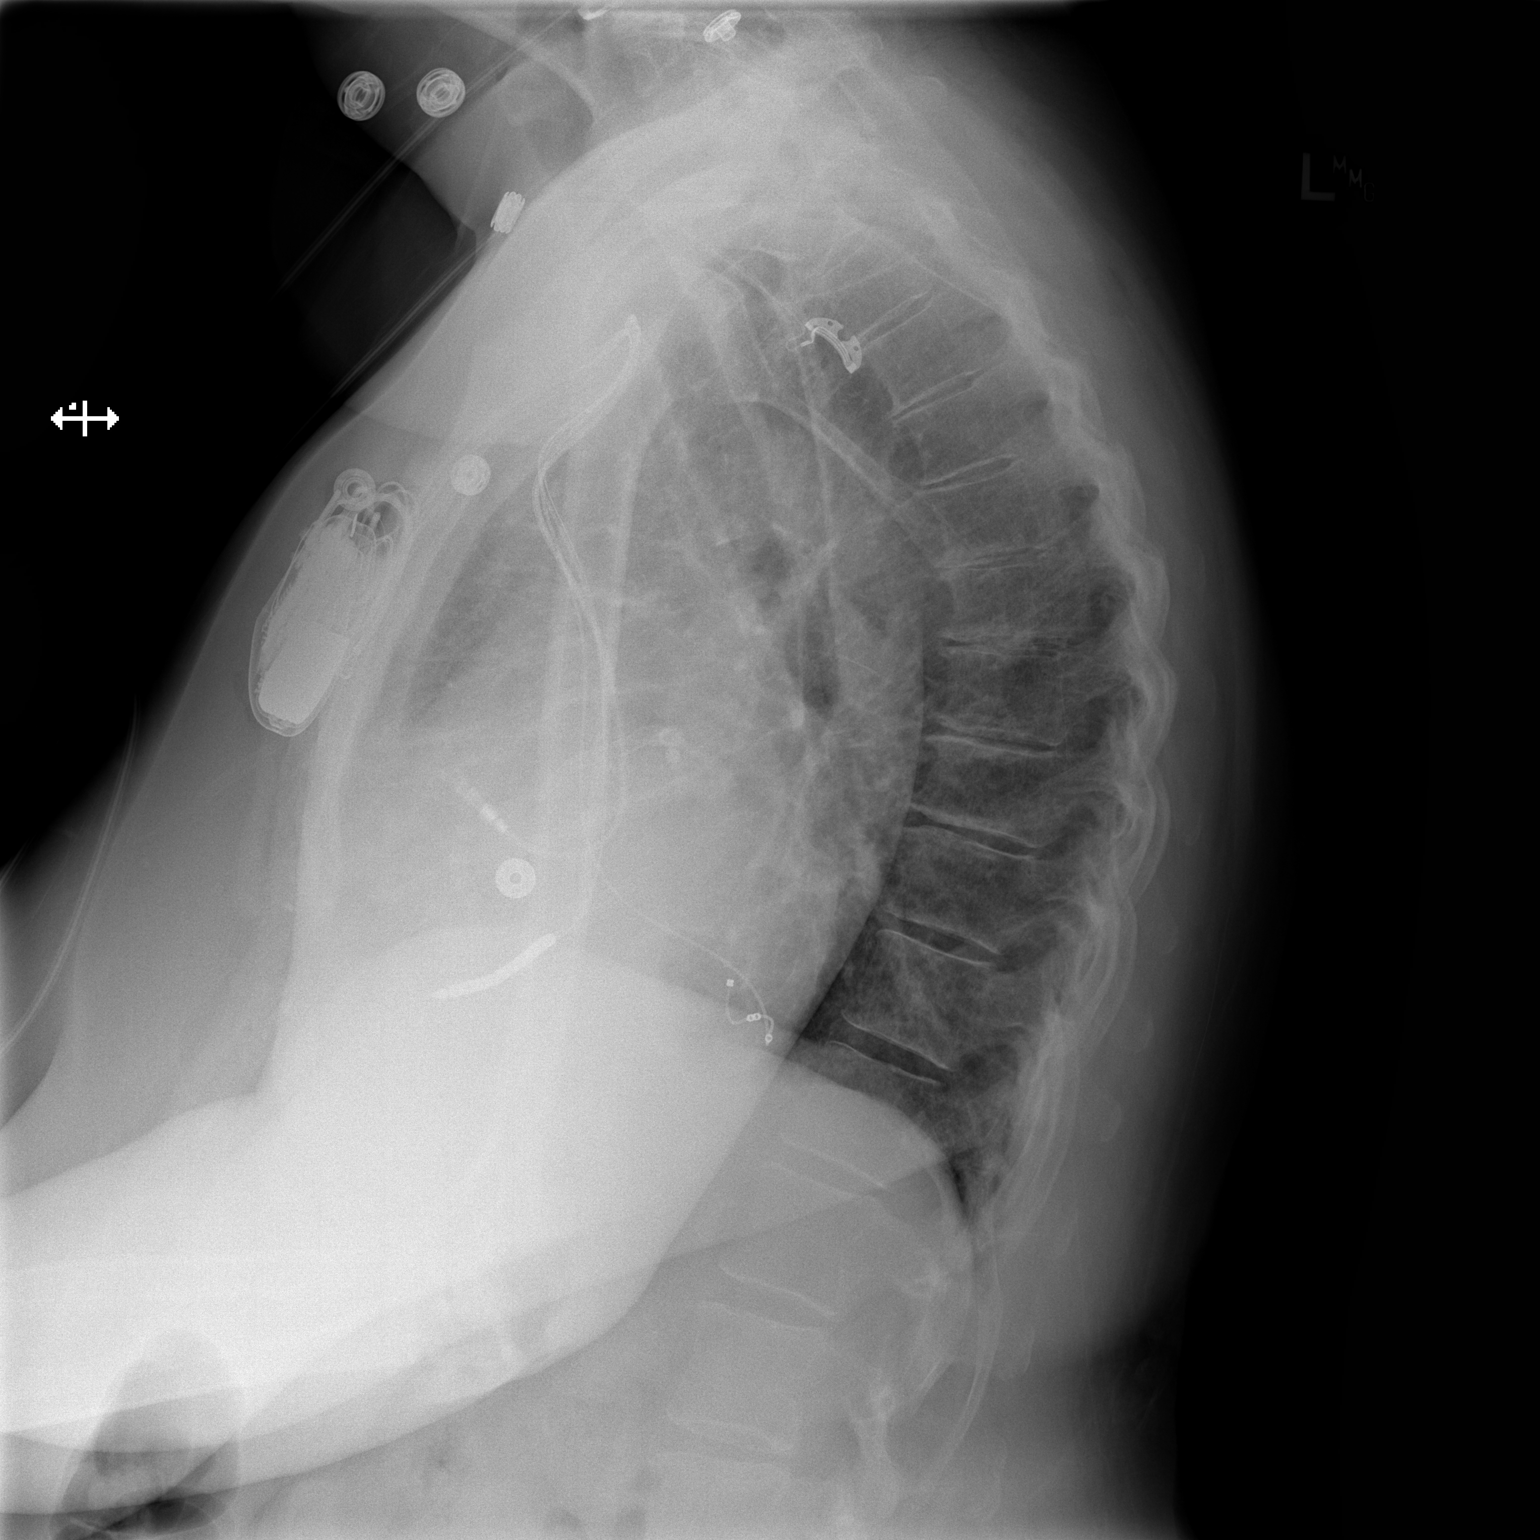

[2 of 2 positions shown; findings below may reference images not displayed]

FINDINGS: Pacemaker leads are attached to the right atrium, right ventricle,
and left ventricle. No pneumothorax.

Lungs are clear. Heart is upper normal in size with pulmonary
vascularity within normal limits. No adenopathy. There is mild
degenerative change in the thoracic spine. There is bilateral
carotid artery calcification.
IMPRESSION: Pacemaker leads as noted above. No pneumothorax. No edema or
consolidation. There is calcification in both carotid arteries.

## 2015-03-28 ENCOUNTER — Ambulatory Visit (INDEPENDENT_AMBULATORY_CARE_PROVIDER_SITE_OTHER): Payer: Medicare Other

## 2015-03-28 DIAGNOSIS — I5022 Chronic systolic (congestive) heart failure: Secondary | ICD-10-CM

## 2015-03-28 DIAGNOSIS — Z95810 Presence of automatic (implantable) cardiac defibrillator: Secondary | ICD-10-CM | POA: Diagnosis not present

## 2015-03-29 NOTE — Progress Notes (Signed)
EPIC Encounter for ICM Monitoring  Patient Name: Becky [name] is a 65 y.o. female Date: 03/29/2015 Primary Care Physican: Bedelia Person, MD Primary Cardiologist: Branch Electrophysiologist: Allred Dry Weight: 191 lbs  Bi-V Pacing 97.3%       In the past month, have you:  1. Gained more than 2 pounds in a day or more than 5 pounds in a week? Yes, 2 pounds   2. Had changes in your medications (with verification of current medications)? no  3. Had more shortness of breath than is usual for you? Yes  4. Limited your activity because of shortness of breath? no  5. Not been able to sleep because of shortness of breath? no  6. Had increased swelling in your feet or ankles? no  7. Had symptoms of dehydration (dizziness, dry mouth, increased thirst, decreased urine output) no  8. Had changes in sodium restriction? no  9. Been compliant with medication? Yes   ICM trend: 3 month view for 03/27/2015   ICM trend: 1 year view for 03/27/2015   Follow-up plan: ICM clinic phone appointment on 04/11/2015.  Optivol thoracic impedance below reference line from 03/02/2015 to 03/28/2015 suggesting fluid retention.  She reported weight gain and SOB in the last 2 weeks and did take an extra dose of Furosemide 1 day in the lat 2 weeks which improved symptoms. She has had a cold for several weeks which may contribute to her symptoms as well.  She still has some SOB.    01/15/2015 BUN 24, Potassium 5.2, Creatinine 1.03  Recommended she increase Furosemide to 40 mg to twice a day x 2 days and then resume prescribed dosage of 40 mg daily.  Repeat transmission on 04/11/2015.  Advised her to call should she have any difficulty taking extra medication or if fluid symptoms worsen.    Copy of note sent to patient's primary care physician, primary cardiologist, and device following physician.  Karie Soda, RN, CCM 03/29/2015 10:41 AM    Hillis Range, MD   Sent: Thu March 29, 2015 1:45 PM    To:  Karie Soda, RN        Message     Thanks!

## 2015-03-31 ENCOUNTER — Other Ambulatory Visit: Payer: Self-pay | Admitting: Cardiology

## 2015-04-11 ENCOUNTER — Telehealth: Payer: Self-pay

## 2015-04-11 ENCOUNTER — Ambulatory Visit (INDEPENDENT_AMBULATORY_CARE_PROVIDER_SITE_OTHER): Payer: Medicare Other

## 2015-04-11 DIAGNOSIS — I5022 Chronic systolic (congestive) heart failure: Secondary | ICD-10-CM

## 2015-04-11 DIAGNOSIS — Z95810 Presence of automatic (implantable) cardiac defibrillator: Secondary | ICD-10-CM

## 2015-04-11 NOTE — Telephone Encounter (Signed)
Remote ICM transmission received.  Attempted patient call and left message for return call.   

## 2015-04-11 NOTE — Progress Notes (Signed)
EPIC Encounter for ICM Monitoring  Patient Name: Becky [name] is a 65 y.o. female Date: 04/11/2015 Primary Care Physican: Bedelia Person, MD Primary Cardiologist: Branch Electrophysiologist: Allred Dry Weight: 191 lbs  Bi-V Pacing 97.3%       In the past month, have you:  1. Gained more than 2 pounds in a day or more than 5 pounds in a week? no  2. Had changes in your medications (with verification of current medications)? no  3. Had more shortness of breath than is usual for you? no  4. Limited your activity because of shortness of breath? no  5. Not been able to sleep because of shortness of breath? no  6. Had increased swelling in your feet or ankles? no  7. Had symptoms of dehydration (dizziness, dry mouth, increased thirst, decreased urine output) no  8. Had changes in sodium restriction? no  9. Been compliant with medication? Yes   ICM trend: 3 month view    ICM trend: 1 year view   Follow-up plan: ICM clinic phone appointment on 05/04/2015.  Appointment with Dr Wyline Mood on 04/20/2015.  Optivol thoracic impedance trending along reference line since last ICM transmission on 03/28/2015 and taking additional Furosemide.  She stated SOB has improved and she is feeling better.  She stated she is doing ok now.  Encouraged to call if she has any fluid symptoms.   No changes today.   Copy of note sent to patient's primary care physician, primary cardiologist, and device following physician.  Karie Soda, RN, CCM 04/11/2015 3:21 PM

## 2015-04-12 NOTE — Telephone Encounter (Signed)
Spoke with patient.

## 2015-04-20 ENCOUNTER — Ambulatory Visit: Payer: Medicare Other | Admitting: Cardiology

## 2015-05-03 ENCOUNTER — Ambulatory Visit (INDEPENDENT_AMBULATORY_CARE_PROVIDER_SITE_OTHER): Payer: Medicare Other | Admitting: Cardiology

## 2015-05-03 ENCOUNTER — Encounter: Payer: Self-pay | Admitting: Cardiology

## 2015-05-03 ENCOUNTER — Encounter: Payer: Self-pay | Admitting: *Deleted

## 2015-05-03 VITALS — BP 136/83 | HR 61 | Ht 60.0 in | Wt 203.0 lb

## 2015-05-03 DIAGNOSIS — I10 Essential (primary) hypertension: Secondary | ICD-10-CM | POA: Diagnosis not present

## 2015-05-03 DIAGNOSIS — E785 Hyperlipidemia, unspecified: Secondary | ICD-10-CM

## 2015-05-03 DIAGNOSIS — I2510 Atherosclerotic heart disease of native coronary artery without angina pectoris: Secondary | ICD-10-CM

## 2015-05-03 DIAGNOSIS — R7309 Other abnormal glucose: Secondary | ICD-10-CM

## 2015-05-03 DIAGNOSIS — I5022 Chronic systolic (congestive) heart failure: Secondary | ICD-10-CM | POA: Diagnosis not present

## 2015-05-03 NOTE — Patient Instructions (Signed)
Your physician wants you to follow-up in: 6 months with Dr Lurena Joiner will receive a reminder letter in the mail two months in advance. If you don't receive a letter, please call our office to schedule the follow-up appointment.  Your physician recommends that you continue on your current medications as directed. Please refer to the Current Medication list given to you today.  Your physician recommends that you return for lab work [email or web address].[id number].  Thank you for choosing [facility]!!

## 2015-05-03 NOTE — Progress Notes (Signed)
Patient ID: NATHIFA CZARNOTA, female   DOB: [birth year], 65 y.o.   MRN: [id number]     Clinical Summary Ms. Goenner is a 65 y.o.female seen today for follow up of the following medical problems.  1. ICM/Chronic systolic heart failure - history of multiple PCIs as described below, mainly in [employer] [place].  - 04/2013 LVEF 30-35%. Repeat echo after BiV AICD 05/2014 shows LVEF 55%, grade I diastolic dysfunction.  - Jan 2017 BiV AICD check normal function - medical therapy has been limited by orthostatic symtpoms - she has been on long term plavix due to her extensive stent burden     - went to [facility] ER with chest pain. Dull pain bilateral ribs, worst with coughing and movement. - no SOB or DOE, though has had cough and sinus drainage.  - compliant with meds. Limiting salt intake. Denies any LE edema..   2. OSA  - compliant with CPAP  3. Hyperlipidemia - muscle aches on multiple statins incluing on pravatastatin and lovastatin in the past, currently on zetia - 07/2014 TC 207 TG 242 HDL 41 LDL 118 - tolerating pravastatin every other day   4. HTN - compliant with meds  Past Medical History  Diagnosis Date  . GERD (gastroesophageal reflux disease)   . Hypertension   . HLD (hyperlipidemia)   . CAD (coronary artery disease)     a. 5/08: s/p DES to PDA and DES to LAD; b. 1/09: s/p DES to pLAD; c. 1/11: s/p DES to LAD x 2, d. LHC (3/12 in [place] - EF 55%, mild ant HK, nl LM, LAD stents ok, oLAD 30, RCA stents ok;  e.  4/13:  s/p DES to RCA, f. 1/14: dLM 20-30, mOM3 20-30, mLAD 80 ISR => s/p 2.5x16 mm Promus Element DES; EF 45-50%   . LBBB (left bundle [place] Jr block)   . COPD (chronic obstructive pulmonary disease) (HCC)   . Ischemic cardiomyopathy     a. Echo (11/14):  EF 25%   . Fibromyalgia   . Syncope 11/14    LifeVest placed  . CHF (congestive heart failure) (HCC)   . Depression   . Myocardial infarction (HCC) 12/2012  . Asthma   . Chronic bronchitis (HCC)     . Daily headache     "might be from sinus" (06/02/2013)  . Arthritis     "all my joints; neck, back, legs, arms, elbows" (06/02/2013)  . Chronic lower back pain     "into pelvis region" (06/02/2013)  . Anxiety      Allergies  Allergen Reactions  . Tape Rash and Other (See Comments)    Severe Reaction (latex tape): Burned hand, Redness-took days to clear up (03/02/12)  . Codeine Nausea And Vomiting  . Prozac [Fluoxetine Hcl] Other (See Comments)    Mean/violent thoughts  . Fosamax [Alendronate Sodium] Nausea Only and Other (See Comments)    Stomach pain  . Keflex [Cephalexin] Nausea Only  . Lipitor [Atorvastatin] Other (See Comments)    Leg pain     Current Outpatient Prescriptions  Medication Sig Dispense Refill  . acetaminophen-codeine (TYLENOL #3) 300-30 MG tablet Take 1 tablet by mouth every 6 (six) hours as needed.  2  . albuterol (PROAIR HFA) 108 (90 BASE) MCG/ACT inhaler Inhale 2 puffs into the lungs every 4 (four) hours as needed for shortness of breath.     Marland Kitchen aspirin 81 MG tablet Take 81 mg by mouth daily.    . Cholecalciferol (VITAMIN D) 2000 UNITS CAPS  Take 2,000 Units by mouth daily.    . clopidogrel (PLAVIX) 75 MG tablet Take 75 mg by mouth daily.     Marland Kitchen escitalopram (LEXAPRO) 20 MG tablet Take 20 mg by mouth daily.     . furosemide (LASIX) 40 MG tablet TAKE 1-2 TABS DAILY 90 tablet 3  . GENERLAC 10 GM/15ML SOLN Take 15 mLs by mouth daily as needed.  5  . ipratropium-albuterol (DUONEB) 0.5-2.5 (3) MG/3ML SOLN Take 3 mLs by nebulization every 6 (six) hours as needed (shortness of breath).    Marland Kitchen lisinopril (PRINIVIL,ZESTRIL) 5 MG tablet TAKE 1 TABLET BY MOUTH EVERY DAY 30 tablet 11  . magnesium oxide (MAG-OX) 400 MG tablet Take 400 mg by mouth daily.    . meclizine (ANTIVERT) 25 MG tablet Take 25 mg by mouth 3 (three) times daily as needed.   0  . metoprolol (TOPROL-XL) 200 MG 24 hr tablet TAKE 1 TABLET BY MOUTH DAILY 30 tablet 3  . montelukast (SINGULAIR) 10 MG tablet  Take 10 mg by mouth every morning.     . nitroGLYCERIN (NITROSTAT) 0.4 MG SL tablet Place 1 tablet (0.4 mg total) under the tongue every 5 (five) minutes x 3 doses as needed. If no relief after 3rd dose, proceed to ED 25 tablet 5  . pantoprazole (PROTONIX) 40 MG tablet TAKE 1 TABLET BY MOUTH DAILY 30 tablet 6  . potassium chloride SA (K-DUR,KLOR-CON) 20 MEQ tablet Take 1 tablet (20 mEq total) by mouth every other day. 45 tablet 3  . pravastatin (PRAVACHOL) 20 MG tablet TAKE 1 TAB EVERY OTHER DAY 45 tablet 3  . rOPINIRole (REQUIP) 0.5 MG tablet Take 1 mg by mouth at bedtime.     Marland Kitchen SPIRIVA HANDIHALER 18 MCG inhalation capsule Place 1 capsule into inhaler and inhale daily.    Marland Kitchen spironolactone (ALDACTONE) 25 MG tablet TAKE 1/2 TABLET BY MOUTH DAILY 15 tablet 11   No current facility-administered medications for this visit.     Past Surgical History  Procedure Laterality Date  . Orif ankle fracture Right 10/21/2009  . Tubal ligation  1989  . Coronary stent placement  02/2012  . Bi-ventricular implantable cardioverter defibrillator  (crt-d)  06/02/2013    MDT VivaQuad CRTD implanted by Dr Johney Frame for ICM, CHF  . Hysteroscopy w/ endometrial ablation  ~ 1998  . Coronary angioplasty with stent placement  2007; 2009; 2011    "?2 +1 +1 "  . Cardiac catheterization  12/2012    S/P MI  . Lead revision  11-08-13    RA and LV lead revision by Dr Johney Frame  . Left heart catheterization with coronary angiogram N/A 01/03/2013    Procedure: LEFT HEART CATHETERIZATION WITH CORONARY ANGIOGRAM;  Surgeon: Kathleene Hazel, MD;  Location: [facility] CATH LAB;  Service: Cardiovascular;  Laterality: N/A;  . Bi-ventricular implantable cardioverter defibrillator N/A 06/02/2013    Procedure: BI-VENTRICULAR IMPLANTABLE CARDIOVERTER DEFIBRILLATOR  (CRT-D);  Surgeon: Gardiner Rhyme, MD;  Location: [facility] CATH LAB;  Service: Cardiovascular;  Laterality: N/A;  . Lead revision Left 11/08/2013    Procedure: LEAD REVISION;  Surgeon:  Gardiner Rhyme, MD;  Location: [facility] CATH LAB;  Service: Cardiovascular;  Laterality: Left;     Allergies  Allergen Reactions  . Tape Rash and Other (See Comments)    Severe Reaction (latex tape): Burned hand, Redness-took days to clear up (03/02/12)  . Codeine Nausea And Vomiting  . Prozac [Fluoxetine Hcl] Other (See Comments)    Mean/violent thoughts  .  Fosamax [Alendronate Sodium] Nausea Only and Other (See Comments)    Stomach pain  . Keflex [Cephalexin] Nausea Only  . Lipitor [Atorvastatin] Other (See Comments)    Leg pain      Family History  Problem Relation Age of Onset  . Breast cancer Mother   . Heart failure Brother   . Breast cancer Sister   . Breast cancer Sister   . Uterine cancer Sister      Social History Ms. Grabski reports that she has been smoking Cigarettes.  She started smoking about 24 years ago. She has a 11.5 pack-year smoking history. She has never used smokeless tobacco. Ms. Tokarski reports that she drinks alcohol.   Review of Systems CONSTITUTIONAL: No weight loss, fever, chills, weakness or fatigue.  HEENT: Eyes: No visual loss, blurred vision, double vision or yellow sclerae.No hearing loss, sneezing, congestion, runny nose or sore throat.  SKIN: No rash or itching.  CARDIOVASCULAR: per HPI RESPIRATORY: No shortness of breath, cough or sputum.  GASTROINTESTINAL: No anorexia, nausea, vomiting or diarrhea. No abdominal pain or blood.  GENITOURINARY: No burning on urination, no polyuria NEUROLOGICAL: No headache, dizziness, syncope, paralysis, ataxia, numbness or tingling in the extremities. No change in bowel or bladder control.  MUSCULOSKELETAL: No muscle, back pain, joint pain or stiffness.  LYMPHATICS: No enlarged nodes. No history of splenectomy.  PSYCHIATRIC: No history of depression or anxiety.  ENDOCRINOLOGIC: No reports of sweating, cold or heat intolerance. No polyuria or polydipsia.  Marland Kitchen   Physical Examination Filed Vitals:    05/03/15 0817  BP: 136/83  Pulse: 61   Filed Weights   05/03/15 0817  Weight: 203 lb (92.08 kg)    Gen: resting comfortably, no acute distress HEENT: no scleral icterus, pupils equal round and reactive, no palptable cervical adenopathy,  CV: RRR, no m/r/g no jvd Resp: Clear to auscultation bilaterally GI: abdomen is soft, non-tender, non-distended, normal bowel sounds, no hepatosplenomegaly MSK: extremities are warm, no edema.  Skin: warm, no rash Neuro:  no focal deficits Psych: appropriate affect   Diagnostic Studies 04/2010 Cath [place], [place]: LVEF 55%, mild anterior hypokinesis. LM normal, LAD with stents in prox and mid portion widely patent, LAD ostial 30%. Diags with luminal irregs. LCX with luminial irregs. RCA with distal patent stents  Stent cards  May 2008 DES to PDA, DES to LAD  Jan 2009 DES to prox LAD  Mar 05 2009 DES to LAD x2  05/2011 DES to RCA  Jan 2014 stent done [place], [place]  03/2010 Echo: LVEF 40%, hypokinesis of the anteroseptum.   01/02/13 Cath  Hemodynamic Findings:  Central aortic pressure: 147/79  Left ventricular pressure: 130/22/26  Angiographic Findings:  Left main: 30% distal stenosis. It appears that the distal left main has a stent that continues into the LAD.  Left Anterior Descending Artery: Large vessel that courses to the apex. The entire proximal and mid LAD is stented. There is mild stent restenosis in the proximal segment. The mid stented segment has diffuse 30% stent restenosis. The distal vessel becomes small in caliber and has mild diffuse plaque. There is a moderate caliber first diagonal Suraiya Dickerson with mild plaque disease.  Circumflex Artery: Moderate caliber non-dominant vessel with three moderate caliber obtuse marginal branches. No obstructive disease.  Right Coronary Artery: Large dominant vessel with 30% proximal stenosis, heavily calcified mid vessel with patent stent in the mid vessel (no significant  restenosis). The distal vessel has diffuse 40-50% stenosis. The posterolateral Calvyn Kurtzman and PDA are moderate  caliber, patent vessels with mild plaque disease.  Left Ventricular Angiogram: LVEF=25-30%. Hypokinesis of the antero-apical wall.  Impression:  1. 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 Elevated troponin following syncopal event. No culprit lesions seen.  3. Moderate to severe LV systolic dysfunction.  Recommendations: Continue medical management of CAD. Workup for syncope is underway. Carotid dopplers today. Will check d-dimer as PE is a possibility. She could also have had an arrythmia with LV dysfunction.  Complications: None. The patient tolerated the procedure well.  01/03/13 Echo  - LVEF 25-30%, akinesis of the anteroseptal and apical myocardium, grade I diastolic dysfunction,  01/03/13 Carotid US  - The vertebral arteries appear patent with antegrade flow. - Findings consistent with1- 39 percent stenosis,high end of scale,involving the right internal carotid artery. - Findings consistent with 40 - 59 percent stenosis, low end of scale,involving the left internal carotid artery. - ICA/CCA ratio. right =1.72. left = 1.52 Other specific details can be found in the table(s) above. Prepared and Electronically Authenticated by  12/13/12 Event monitor  No symptoms reported, sinus rhythm with conduction delay, occasional PVCs.  04/2013 Echo  Study Conclusions  - Left ventricle: The cavity size was normal. Wall thickness was increased in a pattern of mild LVH with moderate basal septal hypertrophy. Systolic function was moderately to severely reduced. The estimated ejection fraction was in the range of 30% to 35%. There is akinesis to dyskinesis of the mid-distal anteroseptal and apical myocardium. There is akinesis of the distalinferoseptal myocardium. Doppler parameters are consistent with abnormal left ventricular relaxation (grade 1 diastolic  dysfunction). - Ventricular septum: Septal motion showed abnormal function and dyssynergy. - Aortic valve: Mildly calcified annulus. Probably trileaflet; mildly calcified leaflets. No significant regurgitation. - Mitral valve: Calcified annulus. Trivial regurgitation. - Left atrium: The atrium was mildly dilated. - Right atrium: Central venous pressure: 8mm Hg (est). - Tricuspid valve: Trivial regurgitation. - Pulmonary arteries: PA peak pressure: 20mm Hg (S). - Pericardium, extracardiac: There was no pericardial effusion. Impressions:  - MildLVH with moderate basal septal hypertrophy, LVEF 30-35% with wall motion abnormalities as outlined, grade 1 diastolic dysfunction. Mild left atrial enlagement. MAC with trivial mitral regurgitation. MIldlysclerotic aortic valve. Trivial tricuspid regurgitation with normal PASP 20 mmHg.  05/2014 echo Study Conclusions  - Left ventricle: Technically limited study. The cavity size was normal. Wall thickness was increased in a pattern of moderate LVH. The estimated ejection fraction was 55%. Doppler parameters are consistent with abnormal left ventricular relaxation (grade 1 diastolic dysfunction). - Aortic valve: Sclerosis without stenosis. - Right ventricle: The cavity size was normal. Pacer wire or catheter noted in right ventricle. Systolic function was normal. - Impressions: Since the study in 2015, there is definite improvement in LV function.  Impressions:  - Since the study in 2015, there is definite improvement in LV function.     Assessment and Plan   1. ICM/Chronic systolic heart failure  - previous LVEF 30-35%. After BiV AICD repeat echo with normalized LVEF - medical therapy has been limited due to orthstatic symptoms, continue current meds - indefinite plavix due to large stent burden - no recent symptoms, continue current meds  2. OSA - continue CPAP  3. Hyperlipidemia - difficulty tolerating  statins in the past. Doing ok on pravastatin every other day, will continue.   4. HTN -at goal, continue current meds      Antoine Poche, M.D.

## 2015-05-04 ENCOUNTER — Ambulatory Visit (INDEPENDENT_AMBULATORY_CARE_PROVIDER_SITE_OTHER): Payer: Medicare Other

## 2015-05-04 DIAGNOSIS — Z95810 Presence of automatic (implantable) cardiac defibrillator: Secondary | ICD-10-CM

## 2015-05-04 DIAGNOSIS — I5022 Chronic systolic (congestive) heart failure: Secondary | ICD-10-CM

## 2015-05-07 NOTE — Progress Notes (Addendum)
EPIC Encounter for ICM Monitoring  Patient Name: Becky [name] is a 65 y.o. female Date: 05/07/2015 Primary Care Physican: Bedelia Person, MD Primary Cardiologist: Branch Electrophysiologist: Allred Dry Weight: unknown  Bi-V Pacing 98.2%      In the past month, have you:  1. Gained more than 2 pounds in a day or more than 5 pounds in a week? N/A  2. Had changes in your medications (with verification of current medications)? N/A  3. Had more shortness of breath than is usual for you? N/A  4. Limited your activity because of shortness of breath? N/A  5. Not been able to sleep because of shortness of breath? N/A  6. Had increased swelling in your feet or ankles? N/A  7. Had symptoms of dehydration (dizziness, dry mouth, increased thirst, decreased urine output) N/A  8. Had changes in sodium restriction? N/A  9. Been compliant with medication? N/A   ICM trend: 3 month view for 05/04/2015         ICM trend: 1 year view for 05/04/2015   Follow-up plan: ICM clinic phone appointment on 05/17/2015.  Attempted call to patient x 2 and unable to reach.  Transmission reviewed.  Thoracic impedance below reference line from 04/21/2015 to 05/04/2015 suggesting fluid accumulation.     Unable to reach patient for ICM follow and will repeat transmission on 05/17/2015  Copy of note sent to patient's primary care physician, primary cardiologist, and device following physician.  Karie Soda, RN, CCM 05/07/2015 10:10 AM

## 2015-05-08 NOTE — Progress Notes (Signed)
Thanks Jacki Cones, I had seen her just a few days ago and she seemed to be doing well. Would just continue regular monitoring at this time  Dominga Ferry MD

## 2015-05-09 ENCOUNTER — Telehealth: Payer: Self-pay

## 2015-05-09 ENCOUNTER — Ambulatory Visit (INDEPENDENT_AMBULATORY_CARE_PROVIDER_SITE_OTHER): Payer: Medicare Other

## 2015-05-09 DIAGNOSIS — I5022 Chronic systolic (congestive) heart failure: Secondary | ICD-10-CM

## 2015-05-09 DIAGNOSIS — Z95810 Presence of automatic (implantable) cardiac defibrillator: Secondary | ICD-10-CM

## 2015-05-09 NOTE — Progress Notes (Signed)
Patient returned call today.  Advised the last ICM transmission showed fluid retention and requested she send an updated transmission today and she stated she would do so now.

## 2015-05-09 NOTE — Telephone Encounter (Signed)
Follow Up ° °Pt returned call//  °

## 2015-05-10 NOTE — Telephone Encounter (Signed)
Spoke with patient.

## 2015-05-10 NOTE — Progress Notes (Signed)
Late entry for 05/09/2015  EPIC Encounter for ICM Monitoring  Patient Name: Becky [name] is a 65 y.o. female Date: 05/10/2015 Primary Care Physican: Bedelia Person, MD Primary Cardiologist: Branch Electrophysiologist: Allred Dry Weight: 200 lb   Bi-V Pacing 98.1%      In the past month, have you:  1. Gained more than 2 pounds in a day or more than 5 pounds in a week? no  2. Had changes in your medications (with verification of current medications)? no  3. Had more shortness of breath than is usual for you? no  4. Limited your activity because of shortness of breath? no  5. Not been able to sleep because of shortness of breath? no  6. Had increased swelling in your feet or ankles? no  7. Had symptoms of dehydration (dizziness, dry mouth, increased thirst, decreased urine output) no  8. Had changes in sodium restriction? no  9. Been compliant with medication? Yes   ICM trend: 3 month view for 05/09/2015   ICM trend: 1 year view for 05/09/2015   Follow-up plan: ICM clinic phone appointment on 05/28/2015.  Thoracic impedance trending back to reference line on 05/09/2015.  Thoracic impedance below reference line 03/23/2015 to 05/08/2015.  Patient reported she did not feel well around 05/04/2015.  She reported her grandchildren were staying with her at that time and she ate foods that would not be on low sodium diet.  She also reported she did not take Furosemide for a couple of days during that time as well.      Education given on importance of taking meds as prescribed and to limit sodium intake to < 2000 mg.   She stated she does not currently have any fluid symptoms and encouraged to call if she develops any symptoms.  No changes today.    Copy of note sent to patient's primary care physician, primary cardiologist, and device following physician.  Karie Soda, RN, CCM 05/10/2015 9:46 AM

## 2015-05-15 ENCOUNTER — Ambulatory Visit (INDEPENDENT_AMBULATORY_CARE_PROVIDER_SITE_OTHER): Payer: Medicare Other

## 2015-05-15 ENCOUNTER — Telehealth: Payer: Self-pay

## 2015-05-15 DIAGNOSIS — I5022 Chronic systolic (congestive) heart failure: Secondary | ICD-10-CM

## 2015-05-15 DIAGNOSIS — Z95810 Presence of automatic (implantable) cardiac defibrillator: Secondary | ICD-10-CM

## 2015-05-15 NOTE — Telephone Encounter (Signed)
Spoke with Tiffany at [facility] and requested a faxed copy of lab results that were completed on 05/14/2015.  She stated she would fax the results.

## 2015-05-15 NOTE — Progress Notes (Signed)
EPIC Encounter for ICM Monitoring  Patient Name: Becky [name] is a 65 y.o. female Date: 05/15/2015 Primary Care Physican: Bedelia Person, MD Primary Cardiologist: Branch Electrophysiologist: Allred Dry Weight: 192 lb   Bi-V Pacing 98.1%      In the past month, have you:  1. Gained more than 2 pounds in a day or more than 5 pounds in a week? no  2. Had changes in your medications (with verification of current medications)? no  3. Had more shortness of breath than is usual for you? Yes  4. Limited your activity because of shortness of breath? no  5. Not been able to sleep because of shortness of breath? no  6. Had increased swelling in your feet or ankles? Yes in ankles  7. Had symptoms of dehydration (dizziness, dry mouth, increased thirst, decreased urine output) no  8. Had changes in sodium restriction? no  9. Been compliant with medication? Yes, confirmed her usual Furosemide dosage 40 mg is taken once daily.   It is prescribed she can take up to 2 tablets a day.     ICM trend: 3 month view for 05/15/2015    ICM trend: 1 year view for 05/15/2015    Follow-up plan: ICM clinic phone appointment on 05/24/2015.  Thoracic impedance below reference line from 04/23/2015 to 05/15/2015 suggesting fluid accumulation.  Patient has fluid symptoms of ankle swelling, SOB and cough.   Encouraged to call if any fluid symptoms worsen or do not resolve.  She reported she did take an extra fluid pill on 05/13/2015.    Recommended to increase Furosemide 40 mg to 2 tablets daily x 3 days and Potassium 20 meq to bid x 3 days.  After the 3rd day, return to normally prescribed dosage Furosemide 40 mg one tablet daily and Potassium 20 meq one tablet daily.  Will repeat transmission on 05/24/2015.  Patient had labs drawn on 05/15/2015.  Requested faxed copy of results from [facility].  Creatinine 0.94, BUN 19, Potassium 4.4  Copy of note sent to patient's primary care physician, primary  cardiologist, and device following physician.  Karie Soda, RN, CCM 05/15/2015 11:05 AM

## 2015-05-24 ENCOUNTER — Telehealth: Payer: Self-pay | Admitting: Cardiology

## 2015-05-24 ENCOUNTER — Ambulatory Visit (INDEPENDENT_AMBULATORY_CARE_PROVIDER_SITE_OTHER): Payer: Medicare Other

## 2015-05-24 DIAGNOSIS — Z95810 Presence of automatic (implantable) cardiac defibrillator: Secondary | ICD-10-CM

## 2015-05-24 DIAGNOSIS — I5022 Chronic systolic (congestive) heart failure: Secondary | ICD-10-CM

## 2015-05-24 NOTE — Telephone Encounter (Signed)
LMOVM reminding pt to send remote transmission.   

## 2015-05-24 NOTE — Progress Notes (Signed)
EPIC Encounter for ICM Monitoring  Patient Name: Becky [name] is a 65 y.o. female Date: 05/24/2015 Primary Care Physican: Bedelia Person, MD Primary Cardiologist: Branch Electrophysiologist: Allred Dry Weight: unknown   Bi-V Pacing 98.1%      In the past month, have you:  1. Gained more than 2 pounds in a day or more than 5 pounds in a week? no  2. Had changes in your medications (with verification of current medications)? no  3. Had more shortness of breath than is usual for you? no  4. Limited your activity because of shortness of breath? no  5. Not been able to sleep because of shortness of breath? no  6. Had increased swelling in your feet or ankles? no  7. Had symptoms of dehydration (dizziness, dry mouth, increased thirst, decreased urine output) no  8. Had changes in sodium restriction? no  9. Been compliant with medication? Yes   ICM trend: 3 month view for 05/24/2015    ICM trend: 1 year view for 05/24/2015    Follow-up plan: ICM clinic phone appointment on 06/26/2015.  Since last ICM transmission on 05/15/2015, thoracic impedance above reference line which correlates with taking extra Furosemide and Potassium x 3 days.  Patient reported fluid symptoms resolved, ankle swelling, weight gain and SOB.  She stated she is feeling much better.   Encouraged to call for any fluid symptoms.  No changes today.    Patient's thoracic impedance has returned to reference line.    Copy of note sent to patient's primary care physician, primary cardiologist, and device following physician.  Karie Soda, RN, CCM 05/24/2015 2:58 PM

## 2015-05-28 ENCOUNTER — Ambulatory Visit (INDEPENDENT_AMBULATORY_CARE_PROVIDER_SITE_OTHER): Payer: Medicare Other | Admitting: *Deleted

## 2015-05-28 DIAGNOSIS — Z95810 Presence of automatic (implantable) cardiac defibrillator: Secondary | ICD-10-CM | POA: Diagnosis not present

## 2015-05-28 DIAGNOSIS — I255 Ischemic cardiomyopathy: Secondary | ICD-10-CM

## 2015-05-28 NOTE — Progress Notes (Signed)
Remote ICD transmission.   

## 2015-05-29 ENCOUNTER — Telehealth: Payer: Self-pay | Admitting: *Deleted

## 2015-05-29 NOTE — Telephone Encounter (Signed)
Pt aware, says pcp increased pravastatin 40 mg daily and pt is tolerating ok at this time, updated medication list

## 2015-05-29 NOTE — Telephone Encounter (Signed)
-----   Message from Antoine Poche, MD sent at 05/22/2015  2:53 PM EDT ----- Labs look ok other than cholesterol is high. Limited options given her side effects on cholesterol meds, continue current meds  J BrancH MD

## 2015-06-19 LAB — CUP PACEART REMOTE DEVICE CHECK
Battery Voltage: 2.98 V
Brady Statistic AP VP Percent: 20.85 %
Brady Statistic AS VP Percent: 77.48 %
Brady Statistic AS VS Percent: 1.28 %
Brady Statistic RV Percent Paced: 0.5 %
Date Time Interrogation Session: 20170410062603
HIGH POWER IMPEDANCE MEASURED VALUE: 98 Ohm
Implantable Lead Implant Date: 20150416
Implantable Lead Implant Date: 20150922
Implantable Lead Implant Date: 20150922
Implantable Lead Location: 753859
Lead Channel Impedance Value: 323 Ohm
Lead Channel Impedance Value: 342 Ohm
Lead Channel Impedance Value: 456 Ohm
Lead Channel Impedance Value: 532 Ohm
Lead Channel Impedance Value: 760 Ohm
Lead Channel Pacing Threshold Amplitude: 0.5 V
Lead Channel Pacing Threshold Amplitude: 0.625 V
Lead Channel Pacing Threshold Amplitude: 2.125 V
Lead Channel Pacing Threshold Pulse Width: 0.4 ms
Lead Channel Sensing Intrinsic Amplitude: 4 mV
Lead Channel Sensing Intrinsic Amplitude: 4 mV
Lead Channel Setting Sensing Sensitivity: 0.3 mV
MDC IDC LEAD LOCATION: 753858
MDC IDC LEAD LOCATION: 753860
MDC IDC LEAD MODEL: 4598
MDC IDC LEAD MODEL: 6935
MDC IDC MSMT BATTERY REMAINING LONGEVITY: 59 mo
MDC IDC MSMT LEADCHNL LV IMPEDANCE VALUE: 380 Ohm
MDC IDC MSMT LEADCHNL LV IMPEDANCE VALUE: 380 Ohm
MDC IDC MSMT LEADCHNL LV IMPEDANCE VALUE: 627 Ohm
MDC IDC MSMT LEADCHNL LV IMPEDANCE VALUE: 627 Ohm
MDC IDC MSMT LEADCHNL LV IMPEDANCE VALUE: 703 Ohm
MDC IDC MSMT LEADCHNL LV IMPEDANCE VALUE: 722 Ohm
MDC IDC MSMT LEADCHNL LV PACING THRESHOLD PULSEWIDTH: 0.8 ms
MDC IDC MSMT LEADCHNL RA PACING THRESHOLD PULSEWIDTH: 0.4 ms
MDC IDC MSMT LEADCHNL RV IMPEDANCE VALUE: 342 Ohm
MDC IDC MSMT LEADCHNL RV IMPEDANCE VALUE: 437 Ohm
MDC IDC MSMT LEADCHNL RV SENSING INTR AMPL: 6.75 mV
MDC IDC MSMT LEADCHNL RV SENSING INTR AMPL: 6.75 mV
MDC IDC SET LEADCHNL LV PACING AMPLITUDE: 2.5 V
MDC IDC SET LEADCHNL LV PACING PULSEWIDTH: 0.8 ms
MDC IDC SET LEADCHNL RA PACING AMPLITUDE: 2 V
MDC IDC STAT BRADY AP VS PERCENT: 0.39 %
MDC IDC STAT BRADY RA PERCENT PACED: 21.24 %

## 2015-06-27 ENCOUNTER — Encounter: Payer: Self-pay | Admitting: Cardiology

## 2015-07-03 NOTE — Progress Notes (Unsigned)
No ICM transmission received for 06/26/2015.  Next remote ICM scheduled for 07/24/2015.

## 2015-07-24 ENCOUNTER — Ambulatory Visit (INDEPENDENT_AMBULATORY_CARE_PROVIDER_SITE_OTHER): Payer: Medicare Other

## 2015-07-24 DIAGNOSIS — Z95810 Presence of automatic (implantable) cardiac defibrillator: Secondary | ICD-10-CM | POA: Diagnosis not present

## 2015-07-24 DIAGNOSIS — I5022 Chronic systolic (congestive) heart failure: Secondary | ICD-10-CM

## 2015-07-24 NOTE — Progress Notes (Signed)
EPIC Encounter for ICM Monitoring  Patient Name: Becky [name] is a 65 y.o. female Date: 07/24/2015 Primary Care Physican: Bedelia Person, MD Primary Cardiologist: Branch Electrophysiologist: Allred Dry Weight: unknown   Bi-V Pacing 97%      In the past month, have you:  1. Gained more than 2 pounds in a day or more than 5 pounds in a week? N/A  2. Had changes in your medications (with verification of current medications)? N/A  3. Had more shortness of breath than is usual for you? N/A  4. Limited your activity because of shortness of breath? N/A  5. Not been able to sleep because of shortness of breath? N/A  6. Had increased swelling in your feet or ankles? N/A  7. Had symptoms of dehydration (dizziness, dry mouth, increased thirst, decreased urine output) N/A  8. Had changes in sodium restriction? N/A  9. Been compliant with medication? N/A   ICM trend: 3 month view for 07/24/2015   ICM trend: 1 year view for 07/24/2015   Follow-up plan: ICM clinic phone appointment on 08/06/2015.  Attempted call to patient and unable to reach.  Transmission reviewed.  Thoracic impedance below reference line from 06/23/2015 to 07/24/2015 suggesting fluid accumulation.    Copy of note sent to patient's primary care physician, primary cardiologist, and device following physician.  Karie Soda, RN, CCM 07/24/2015 3:38 PM

## 2015-07-27 ENCOUNTER — Telehealth: Payer: Self-pay | Admitting: Internal Medicine

## 2015-07-27 NOTE — Telephone Encounter (Signed)
Spoke w/ pt and informed her that ICM nurse was out of office today and I would let her know on Monday (08-29-15) that pt had called. Pt verbalized understanding.

## 2015-07-27 NOTE — Telephone Encounter (Signed)
Attempted to return call. No answer and unable to leave a message.  

## 2015-07-27 NOTE — Telephone Encounter (Signed)
New message  Pt is simply returning the call

## 2015-07-29 ENCOUNTER — Other Ambulatory Visit: Payer: Self-pay | Admitting: Cardiology

## 2015-07-30 ENCOUNTER — Telehealth: Payer: Self-pay | Admitting: Internal Medicine

## 2015-07-30 ENCOUNTER — Other Ambulatory Visit: Payer: Self-pay | Admitting: Cardiology

## 2015-07-30 NOTE — Telephone Encounter (Signed)
New message     The pt is returning Laurie's call

## 2015-07-31 ENCOUNTER — Telehealth: Payer: Self-pay | Admitting: Internal Medicine

## 2015-07-31 ENCOUNTER — Ambulatory Visit (INDEPENDENT_AMBULATORY_CARE_PROVIDER_SITE_OTHER): Payer: Medicare Other

## 2015-07-31 DIAGNOSIS — Z95810 Presence of automatic (implantable) cardiac defibrillator: Secondary | ICD-10-CM

## 2015-07-31 DIAGNOSIS — I5022 Chronic systolic (congestive) heart failure: Secondary | ICD-10-CM

## 2015-07-31 NOTE — Telephone Encounter (Signed)
Follow-up       The pt is retuning Laurie's call.

## 2015-07-31 NOTE — Telephone Encounter (Signed)
See phone note from Jacki Cones, [place] on 07/30/2015.

## 2015-07-31 NOTE — Progress Notes (Signed)
EPIC Encounter for ICM Monitoring  Patient Name: Becky [name] is a 65 y.o. female Date: 07/31/2015 Primary Care Physican: Bedelia Person, MD Primary Cardiologist: Branch Electrophysiologist: Allred Dry Weight: unknown  Bi-V Pacing 95.9%      In the past month, have you:  1. Gained more than 2 pounds in a day or more than 5 pounds in a week? no  2. Had changes in your medications (with verification of current medications)? no  3. Had more shortness of breath than is usual for you? no  4. Limited your activity because of shortness of breath? no  5. Not been able to sleep because of shortness of breath? no  6. Had increased swelling in your feet, ankles, legs or stomach area? no  7. Had symptoms of dehydration (dizziness, dry mouth, increased thirst, decreased urine output) no  8. Had changes in sodium restriction? no  9. Been compliant with medication? Yes  ICM trend: 3 month view for 07/31/2015  ICM trend: 1 year view for 07/31/2015  Follow-up plan: ICM clinic phone appointment 08/06/2015.    FLUID LEVELS: Since last ICM transmission 07/24/2015, Optivol thoracic impedance continues to be decreased since 06/26/2015 to 07/31/2015 suggesting fluid accumulation with the exception of 2 days at baseline.  Fluid index is >threshold.  SYMPTOMS:  She stated she has an increase in SOB and she feels more fatigued which is how she feels when she has fluid symptoms.     RECOMMENDATIONS:  Advised to increase Furosemide 40 mg bid x 3 days and Potassium 20 mEq daily x 3 days.  Advised after 3 days return to prescribed dosage of Furosemide 40 mg daily and Potassium 20 mEq Every other day.  Advised to call if she has any side effects from taking extra Furosemide dosage.  Recheck fluid levels 08/06/2015.    Advised will send to PCP, Dr. Wyline Mood and Dr. Johney Frame for review regarding symptoms and decreased thoracic impedance.  If any further recommendations, will call back.    Karie Soda, RN,  CCM 07/31/2015 3:31 PM

## 2015-07-31 NOTE — Telephone Encounter (Signed)
Returned patient call.  Left message to send ICM remote transmission.  Provided direct phone number for call back.

## 2015-08-01 NOTE — Telephone Encounter (Signed)
Spoke with patient.

## 2015-08-02 NOTE — Progress Notes (Signed)
Antoine Poche, MD   Sent: Thu August 02, 2015 1:15 PM    To: Karie Soda, RN        Message     I agree with your recs        Dominga Ferry MD

## 2015-08-06 ENCOUNTER — Telehealth: Payer: Self-pay | Admitting: Cardiology

## 2015-08-06 ENCOUNTER — Ambulatory Visit (INDEPENDENT_AMBULATORY_CARE_PROVIDER_SITE_OTHER): Payer: Medicare Other

## 2015-08-06 DIAGNOSIS — Z95810 Presence of automatic (implantable) cardiac defibrillator: Secondary | ICD-10-CM

## 2015-08-06 DIAGNOSIS — I5022 Chronic systolic (congestive) heart failure: Secondary | ICD-10-CM

## 2015-08-06 NOTE — Telephone Encounter (Signed)
LMOVM reminding pt to send remote transmission.   

## 2015-08-07 NOTE — Progress Notes (Signed)
EPIC Encounter for ICM Monitoring  Patient Name: Becky [name] is a 65 y.o. female Date: 08/07/2015 Primary Care Physican: Bedelia Person, MD Primary Cardiologist: Branch Electrophysiologist: Allred Dry Weight: 190 lbs  Bi-V Pacing 97%      In the past month, have you:  1. Gained more than 2 pounds in a day or more than 5 pounds in a week? No, weight gain has resolved  2. Had changes in your medications (with verification of current medications)? No  3. Had more shortness of breath than is usual for you? No   4. Limited your activity because of shortness of breath? No   5. Not been able to sleep because of shortness of breath? No   6. Had increased swelling in your feet, ankles, legs or stomach area? Has resolved  7. Had symptoms of dehydration (dizziness, dry mouth, increased thirst, decreased urine output) No   8. Had changes in sodium restriction? No   9. Been compliant with medication? Yes   ICM trend: 3 month view for 08/06/2015   ICM trend: 1 year view for 08/06/2015   Follow-up plan: ICM clinic phone appointment 08/27/2015.  Attempted call to patient and unable to reach.  Transmission reviewed.  FLUID LEVELS: Since last ICM transmission 07/31/2015, Optivol thoracic impedance returned to baseline suggesting fluid level is stabilizing which correlates with taking extra Furosemide and Potassium..    SYMPTOMS:   She reported taking the extra Furosemide and Potassium x 4 days instead of the 3 days that was recommended because she still had fluid retention on the 3rd day.  She thinks the extra day of increased dosage resolved her symptoms of swelling in her feet, hands, and stomach have resolved.  Weight has returned to baseline.  Encouraged to call for any fluid symptoms.   RECOMMENDATIONS: No changes today.    Advised will send updated ICM transmission to PCP, Dr. Wyline Mood and Dr. Johney Frame for review after taking extra Furosemide and Potassium x 4 days.      Karie Soda,  RN, CCM 08/07/2015 8:36 AM

## 2015-08-27 ENCOUNTER — Ambulatory Visit (INDEPENDENT_AMBULATORY_CARE_PROVIDER_SITE_OTHER): Payer: Medicare Other | Admitting: *Deleted

## 2015-08-27 ENCOUNTER — Telehealth: Payer: Self-pay

## 2015-08-27 DIAGNOSIS — Z95810 Presence of automatic (implantable) cardiac defibrillator: Secondary | ICD-10-CM

## 2015-08-27 DIAGNOSIS — I5022 Chronic systolic (congestive) heart failure: Secondary | ICD-10-CM | POA: Diagnosis not present

## 2015-08-27 DIAGNOSIS — I255 Ischemic cardiomyopathy: Secondary | ICD-10-CM

## 2015-08-27 NOTE — Progress Notes (Signed)
Remote ICD transmission.   

## 2015-08-27 NOTE — Progress Notes (Addendum)
EPIC Encounter for ICM Monitoring  Patient Name: Becky [name] is a 65 y.o. female Date: 08/27/2015 Primary Care Physican: Bedelia Person, MD Primary Cardiologist: Branch Electrophysiologist: Allred Dry Weight: 190 lb  Bi-V Pacing:  95.6%       Heart Failure questions reviewed, pt symptomatic with SOB.  Denied leg swelling or weight gain.  Thoracic impedence below reference line since 08/18/2015 suggesting fluid retention.   Low sodium diet education provided  Recommendations:   Recommended to increase Furosemide 40 mg to bid x 3 days and Potassium 20 mEq 1 tablet daily instead of every other day x 3 days.  Return to prescribed dosage after 3rd day of Furosemide 40 mg daily and Potassium 20 mEq every other day.   Repeat transmission 08/31/2015.  ICM trend: 08/27/2015      Follow-up plan: ICM clinic phone appointment on 08/31/2015.  Copy of ICM check sent to primary cardiologist and device physician for review and recommendations.   Karie Soda, RN 08/27/2015 3:24 PM

## 2015-08-27 NOTE — Telephone Encounter (Signed)
Attempted patient call and left message requesting to send remote transmission.

## 2015-08-28 LAB — CUP PACEART REMOTE DEVICE CHECK
Battery Remaining Longevity: 56 mo
Brady Statistic AP VP Percent: 63.15 %
Brady Statistic AP VS Percent: 1.06 %
Brady Statistic AS VS Percent: 0.63 %
HIGH POWER IMPEDANCE MEASURED VALUE: 94 Ohm
Implantable Lead Location: 753860
Implantable Lead Model: 4598
Implantable Lead Model: 5076
Lead Channel Impedance Value: 323 Ohm
Lead Channel Impedance Value: 342 Ohm
Lead Channel Impedance Value: 437 Ohm
Lead Channel Impedance Value: 532 Ohm
Lead Channel Impedance Value: 779 Ohm
Lead Channel Impedance Value: 779 Ohm
Lead Channel Pacing Threshold Amplitude: 0.75 V
Lead Channel Pacing Threshold Amplitude: 2.25 V
Lead Channel Pacing Threshold Pulse Width: 0.4 ms
Lead Channel Sensing Intrinsic Amplitude: 2.75 mV
Lead Channel Setting Sensing Sensitivity: 0.3 mV
MDC IDC LEAD IMPLANT DT: 20150416
MDC IDC LEAD IMPLANT DT: 20150922
MDC IDC LEAD IMPLANT DT: 20150922
MDC IDC LEAD LOCATION: 753858
MDC IDC LEAD LOCATION: 753859
MDC IDC LEAD MODEL: 6935
MDC IDC MSMT BATTERY VOLTAGE: 2.97 V
MDC IDC MSMT LEADCHNL LV IMPEDANCE VALUE: 380 Ohm
MDC IDC MSMT LEADCHNL LV IMPEDANCE VALUE: 532 Ohm
MDC IDC MSMT LEADCHNL LV IMPEDANCE VALUE: 570 Ohm
MDC IDC MSMT LEADCHNL LV IMPEDANCE VALUE: 589 Ohm
MDC IDC MSMT LEADCHNL LV IMPEDANCE VALUE: 779 Ohm
MDC IDC MSMT LEADCHNL LV PACING THRESHOLD PULSEWIDTH: 0.8 ms
MDC IDC MSMT LEADCHNL RA IMPEDANCE VALUE: 456 Ohm
MDC IDC MSMT LEADCHNL RA PACING THRESHOLD AMPLITUDE: 0.875 V
MDC IDC MSMT LEADCHNL RA SENSING INTR AMPL: 2.75 mV
MDC IDC MSMT LEADCHNL RV IMPEDANCE VALUE: 494 Ohm
MDC IDC MSMT LEADCHNL RV PACING THRESHOLD PULSEWIDTH: 0.4 ms
MDC IDC MSMT LEADCHNL RV SENSING INTR AMPL: 7.25 mV
MDC IDC MSMT LEADCHNL RV SENSING INTR AMPL: 7.25 mV
MDC IDC SESS DTM: 20170710170327
MDC IDC SET LEADCHNL LV PACING AMPLITUDE: 2.5 V
MDC IDC SET LEADCHNL LV PACING PULSEWIDTH: 0.8 ms
MDC IDC SET LEADCHNL RA PACING AMPLITUDE: 2 V
MDC IDC STAT BRADY AS VP PERCENT: 35.16 %
MDC IDC STAT BRADY RA PERCENT PACED: 64.21 %
MDC IDC STAT BRADY RV PERCENT PACED: 27.86 %

## 2015-08-29 ENCOUNTER — Encounter: Payer: Self-pay | Admitting: Cardiology

## 2015-08-31 ENCOUNTER — Ambulatory Visit (INDEPENDENT_AMBULATORY_CARE_PROVIDER_SITE_OTHER): Payer: Medicare Other

## 2015-08-31 DIAGNOSIS — I5022 Chronic systolic (congestive) heart failure: Secondary | ICD-10-CM

## 2015-08-31 DIAGNOSIS — Z95810 Presence of automatic (implantable) cardiac defibrillator: Secondary | ICD-10-CM

## 2015-08-31 NOTE — Progress Notes (Signed)
EPIC Encounter for ICM Monitoring  Patient Name: Becky [name] is a 65 y.o. female Date: 08/31/2015 Primary Care Physican: Bedelia Person, MD Primary Cardiologist: Branch Electrophysiologist: Allred Dry Weight: unknown Bi-V Pacing:  95.7%       Heart Failure questions reviewed, pt feeling tired but SOB has improved  Thoracic impedence returned to baseline after taking extra Furosemide x 3 days as recommended.  Recommendations: No changes.  Discussed foods she has in her home and the salt content.  Lot of education provided on low sodium diet.  She did not realize how much salt were already in foods.  Provided suggestions to replace high salt foods. Patient receptive to education and realizes this is causing her to retain fluid.    ICM trend: 08/31/2015    Follow-up plan: ICM clinic phone appointment on 10/01/2015.  Copy of ICM check sent to primary cardiologist and device physician to show impedance returned to baseline after taking extra Furosemide.   Karie Soda, RN 08/31/2015 1:18 PM

## 2015-10-01 ENCOUNTER — Telehealth: Payer: Self-pay

## 2015-10-01 ENCOUNTER — Ambulatory Visit (INDEPENDENT_AMBULATORY_CARE_PROVIDER_SITE_OTHER): Payer: Medicare Other

## 2015-10-01 DIAGNOSIS — I5022 Chronic systolic (congestive) heart failure: Secondary | ICD-10-CM

## 2015-10-01 DIAGNOSIS — Z95810 Presence of automatic (implantable) cardiac defibrillator: Secondary | ICD-10-CM

## 2015-10-01 NOTE — Progress Notes (Signed)
EPIC Encounter for ICM Monitoring  Patient Name: Becky [name] is a 65 y.o. female Date: 10/01/2015 Primary Care Physican: Bedelia Person, MD Primary Cardiologist: Branch Electrophysiologist: Allred Dry Weight: unknown Bi-V Pacing:  97%       Heart Failure questions reviewed, pt symptomatic with stomach bloating.  Last week she was taking Furosemide 40 mg 1 tablet daily.  Yesterday, 09/30/2015, she increased Furosemide to 2 tablets due to stomach bloating.   Thoracic impedance abnormal suggesting fluid accumulation 08/29/2015 to 09/10/2015 (1 day at baseline during that time) and 09/25/2015 to 10/01/2015.  LABS: 05/14/2015 Creatinine 0.94, BUN 19, Potassium 4.4, Sodium 138 03/21/2015 Creatinine 0.80, BUN 11, Potassium 3.3, Sodium 136  Recommendations: She took 2 Furosemide 40 mg tablets on 09/30/2015 and recommended she take Furosemide 40 mg to 2 tablets daily x 2 days and increase Potassium to 20 mEq daily while taking extra Furosemide.  Return to prescribed dosage of Furosemide 40 mg 1-2 tablets daily and Potassium 20 mEq every other day.  Low sodium diet education provided.  Repeat transmission 10/05/2015.   ICM trend: 10/01/2015     Follow-up plan: ICM clinic phone appointment on 10/05/2015.  Copy of ICM check sent to primary cardiologist and device physician for review.   Karie Soda, RN 10/01/2015 10:03 AM

## 2015-10-01 NOTE — Telephone Encounter (Signed)
Remote ICM transmission received.  Attempted patient call and left message for return call.   

## 2015-10-03 ENCOUNTER — Observation Stay (HOSPITAL_COMMUNITY): Payer: Medicare Other

## 2015-10-03 ENCOUNTER — Observation Stay (HOSPITAL_COMMUNITY)
Admission: AD | Admit: 2015-10-03 | Discharge: 2015-10-05 | Disposition: A | Discharge status: Home / Self Care | Payer: Medicare Other | Source: Other Acute Inpatient Hospital | Attending: Internal Medicine | Admitting: Internal Medicine

## 2015-10-03 ENCOUNTER — Encounter (HOSPITAL_COMMUNITY): Payer: Self-pay | Admitting: General Practice

## 2015-10-03 DIAGNOSIS — I447 Left bundle-branch block, unspecified: Secondary | ICD-10-CM | POA: Diagnosis not present

## 2015-10-03 DIAGNOSIS — F32A Depression, unspecified: Secondary | ICD-10-CM

## 2015-10-03 DIAGNOSIS — I5042 Chronic combined systolic (congestive) and diastolic (congestive) heart failure: Secondary | ICD-10-CM | POA: Diagnosis not present

## 2015-10-03 DIAGNOSIS — M797 Fibromyalgia: Secondary | ICD-10-CM | POA: Diagnosis not present

## 2015-10-03 DIAGNOSIS — I252 Old myocardial infarction: Secondary | ICD-10-CM | POA: Insufficient documentation

## 2015-10-03 DIAGNOSIS — R079 Chest pain, unspecified: Secondary | ICD-10-CM | POA: Diagnosis present

## 2015-10-03 DIAGNOSIS — F329 Major depressive disorder, single episode, unspecified: Secondary | ICD-10-CM | POA: Insufficient documentation

## 2015-10-03 DIAGNOSIS — K219 Gastro-esophageal reflux disease without esophagitis: Secondary | ICD-10-CM | POA: Diagnosis not present

## 2015-10-03 DIAGNOSIS — R0789 Other chest pain: Secondary | ICD-10-CM | POA: Diagnosis present

## 2015-10-03 DIAGNOSIS — Z720 Tobacco use: Secondary | ICD-10-CM

## 2015-10-03 DIAGNOSIS — I959 Hypotension, unspecified: Secondary | ICD-10-CM | POA: Diagnosis not present

## 2015-10-03 DIAGNOSIS — I5022 Chronic systolic (congestive) heart failure: Secondary | ICD-10-CM | POA: Diagnosis not present

## 2015-10-03 DIAGNOSIS — I25110 Atherosclerotic heart disease of native coronary artery with unstable angina pectoris: Secondary | ICD-10-CM | POA: Diagnosis not present

## 2015-10-03 DIAGNOSIS — J441 Chronic obstructive pulmonary disease with (acute) exacerbation: Secondary | ICD-10-CM | POA: Diagnosis not present

## 2015-10-03 DIAGNOSIS — F17210 Nicotine dependence, cigarettes, uncomplicated: Secondary | ICD-10-CM | POA: Diagnosis not present

## 2015-10-03 DIAGNOSIS — R072 Precordial pain: Secondary | ICD-10-CM

## 2015-10-03 DIAGNOSIS — Z955 Presence of coronary angioplasty implant and graft: Secondary | ICD-10-CM | POA: Diagnosis not present

## 2015-10-03 DIAGNOSIS — I110 Hypertensive heart disease with heart failure: Secondary | ICD-10-CM | POA: Insufficient documentation

## 2015-10-03 DIAGNOSIS — I10 Essential (primary) hypertension: Secondary | ICD-10-CM | POA: Diagnosis present

## 2015-10-03 DIAGNOSIS — J449 Chronic obstructive pulmonary disease, unspecified: Secondary | ICD-10-CM

## 2015-10-03 DIAGNOSIS — I2510 Atherosclerotic heart disease of native coronary artery without angina pectoris: Secondary | ICD-10-CM | POA: Diagnosis not present

## 2015-10-03 DIAGNOSIS — E785 Hyperlipidemia, unspecified: Secondary | ICD-10-CM | POA: Diagnosis not present

## 2015-10-03 DIAGNOSIS — I509 Heart failure, unspecified: Secondary | ICD-10-CM

## 2015-10-03 DIAGNOSIS — G4733 Obstructive sleep apnea (adult) (pediatric): Secondary | ICD-10-CM | POA: Insufficient documentation

## 2015-10-03 DIAGNOSIS — Z95810 Presence of automatic (implantable) cardiac defibrillator: Secondary | ICD-10-CM | POA: Insufficient documentation

## 2015-10-03 HISTORY — DX: Obstructive sleep apnea (adult) (pediatric): G47.33

## 2015-10-03 HISTORY — DX: Dependence on other enabling machines and devices: Z99.89

## 2015-10-03 HISTORY — DX: Other chronic pain: G89.29

## 2015-10-03 HISTORY — DX: Presence of automatic (implantable) cardiac defibrillator: Z95.810

## 2015-10-03 HISTORY — DX: Dorsalgia, unspecified: M54.9

## 2015-10-03 LAB — COMPREHENSIVE METABOLIC PANEL
ALBUMIN: 3.6 g/dL (ref 3.5–5.0)
ALT: 33 U/L (ref 14–54)
ANION GAP: 8 (ref 5–15)
AST: 22 U/L (ref 15–41)
Alkaline Phosphatase: 46 U/L (ref 38–126)
BILIRUBIN TOTAL: 0.7 mg/dL (ref 0.3–1.2)
BUN: 20 mg/dL (ref 6–20)
CO2: 26 mmol/L (ref 22–32)
Calcium: 10.6 mg/dL — ABNORMAL HIGH (ref 8.9–10.3)
Chloride: 102 mmol/L (ref 101–111)
Creatinine, Ser: 1.02 mg/dL — ABNORMAL HIGH (ref 0.44–1.00)
GFR calc Af Amer: >60 mL/min (ref >60)
GFR, EST NON AFRICAN AMERICAN: 56 mL/min — AB (ref >60)
Glucose, Bld: 139 mg/dL — ABNORMAL HIGH (ref 65–99)
POTASSIUM: 3.4 mmol/L — AB (ref 3.5–5.1)
Sodium: 136 mmol/L (ref 135–145)
TOTAL PROTEIN: 6.1 g/dL — AB (ref 6.5–8.1)

## 2015-10-03 LAB — CBC WITH DIFFERENTIAL/PLATELET
BASOS ABS: 0 10*3/uL (ref 0.0–0.1)
Basophils Relative: 0 %
Eosinophils Absolute: 0.4 10*3/uL (ref 0.0–0.7)
Eosinophils Relative: 5 %
HEMATOCRIT: 44.9 % (ref 36.0–46.0)
HEMOGLOBIN: 15.3 g/dL — AB (ref 12.0–15.0)
LYMPHS PCT: 31 %
Lymphs Abs: 2.5 10*3/uL (ref 0.7–4.0)
MCH: 32.1 pg (ref 26.0–34.0)
MCHC: 34.1 g/dL (ref 30.0–36.0)
MCV: 94.3 fL (ref 78.0–100.0)
Monocytes Absolute: 0.5 10*3/uL (ref 0.1–1.0)
Monocytes Relative: 7 %
NEUTROS ABS: 4.6 10*3/uL (ref 1.7–7.7)
Neutrophils Relative %: 57 %
Platelets: 230 10*3/uL (ref 150–400)
RBC: 4.76 MIL/uL (ref 3.87–5.11)
RDW: 12.8 % (ref 11.5–15.5)
WBC: 8 10*3/uL (ref 4.0–10.5)

## 2015-10-03 LAB — BRAIN NATRIURETIC PEPTIDE: B Natriuretic Peptide: 10.5 pg/mL (ref 0.0–100.0)

## 2015-10-03 LAB — URINALYSIS, ROUTINE W REFLEX MICROSCOPIC
GLUCOSE, UA: NEGATIVE mg/dL
Hgb urine dipstick: NEGATIVE
Ketones, ur: NEGATIVE mg/dL
LEUKOCYTES UA: NEGATIVE
Nitrite: NEGATIVE
PH: 5.5 (ref 5.0–8.0)
PROTEIN: NEGATIVE mg/dL
Specific Gravity, Urine: 1.03 (ref 1.005–1.030)

## 2015-10-03 LAB — TROPONIN I
Troponin I: <0.03 ng/mL (ref <0.03)
Troponin I: <0.03 ng/mL (ref <0.03)

## 2015-10-03 MED ORDER — SPIRONOLACTONE 25 MG PO TABS
12.5000 mg | ORAL_TABLET | Freq: Every day | ORAL | Status: DC
  Administered 2015-10-03 – 2015-10-05 (×2): 12.5 mg via ORAL
  Filled 2015-10-03 (×3): qty 1

## 2015-10-03 MED ORDER — GI COCKTAIL ~~LOC~~
30.0000 mL | Freq: Four times a day (QID) | ORAL | Status: DC | PRN
  Administered 2015-10-04 – 2015-10-05 (×2): 30 mL via ORAL
  Filled 2015-10-03 (×2): qty 30

## 2015-10-03 MED ORDER — ESCITALOPRAM OXALATE 20 MG PO TABS
20.0000 mg | ORAL_TABLET | Freq: Every day | ORAL | Status: DC
  Administered 2015-10-03 – 2015-10-05 (×2): 20 mg via ORAL
  Filled 2015-10-03 (×3): qty 1

## 2015-10-03 MED ORDER — TRAZODONE HCL 50 MG PO TABS: 25.0000 mg | ORAL_TABLET | Freq: Every evening | ORAL | Status: DC | PRN

## 2015-10-03 MED ORDER — PANTOPRAZOLE SODIUM 40 MG PO TBEC
40.0000 mg | DELAYED_RELEASE_TABLET | Freq: Every day | ORAL | Status: DC
  Administered 2015-10-03 – 2015-10-05 (×3): 40 mg via ORAL
  Filled 2015-10-03 (×3): qty 1

## 2015-10-03 MED ORDER — ONDANSETRON HCL 4 MG/2ML IJ SOLN: 4.0000 mg | Freq: Four times a day (QID) | INTRAMUSCULAR | Status: DC | PRN

## 2015-10-03 MED ORDER — POTASSIUM CHLORIDE CRYS ER 20 MEQ PO TBCR
20.0000 meq | EXTENDED_RELEASE_TABLET | ORAL | Status: DC
  Administered 2015-10-03 – 2015-10-05 (×2): 20 meq via ORAL
  Filled 2015-10-03 (×2): qty 1

## 2015-10-03 MED ORDER — MAGNESIUM CITRATE PO SOLN: 1.0000 | Freq: Once | ORAL | Status: DC | PRN

## 2015-10-03 MED ORDER — OXYCODONE-ACETAMINOPHEN 5-325 MG PO TABS
1.0000 | ORAL_TABLET | ORAL | Status: DC | PRN
  Administered 2015-10-03 – 2015-10-04 (×4): 1 via ORAL
  Administered 2015-10-04 – 2015-10-05 (×2): 2 via ORAL
  Administered 2015-10-05: 1 via ORAL
  Filled 2015-10-03 (×4): qty 1
  Filled 2015-10-03: qty 2
  Filled 2015-10-03: qty 1
  Filled 2015-10-03: qty 2

## 2015-10-03 MED ORDER — ENOXAPARIN SODIUM 40 MG/0.4ML ~~LOC~~ SOLN
40.0000 mg | SUBCUTANEOUS | Status: DC
  Administered 2015-10-03 – 2015-10-04 (×2): 40 mg via SUBCUTANEOUS
  Filled 2015-10-03 (×2): qty 0.4

## 2015-10-03 MED ORDER — PRAVASTATIN SODIUM 40 MG PO TABS
40.0000 mg | ORAL_TABLET | Freq: Every day | ORAL | Status: DC
  Administered 2015-10-03 – 2015-10-04 (×2): 40 mg via ORAL
  Filled 2015-10-03 (×2): qty 1

## 2015-10-03 MED ORDER — ROPINIROLE HCL 1 MG PO TABS
1.0000 mg | ORAL_TABLET | Freq: Every day | ORAL | Status: DC
  Administered 2015-10-03 – 2015-10-04 (×2): 1 mg via ORAL
  Filled 2015-10-03 (×2): qty 1

## 2015-10-03 MED ORDER — ASPIRIN EC 81 MG PO TBEC
81.0000 mg | DELAYED_RELEASE_TABLET | Freq: Every day | ORAL | Status: DC
  Administered 2015-10-05: 81 mg via ORAL
  Filled 2015-10-03 (×2): qty 1

## 2015-10-03 MED ORDER — ENOXAPARIN SODIUM 40 MG/0.4ML ~~LOC~~ SOLN: 40.0000 mg | SUBCUTANEOUS | Status: DC

## 2015-10-03 MED ORDER — MECLIZINE HCL 25 MG PO TABS: 25.0000 mg | ORAL_TABLET | Freq: Three times a day (TID) | ORAL | Status: DC | PRN

## 2015-10-03 MED ORDER — ACETAMINOPHEN 325 MG PO TABS: 650.0000 mg | ORAL_TABLET | ORAL | Status: DC | PRN

## 2015-10-03 MED ORDER — BISACODYL 10 MG RE SUPP
10.0000 mg | Freq: Every day | RECTAL | Status: DC | PRN
Start: 2015-10-03 — End: 2015-10-05

## 2015-10-03 MED ORDER — FUROSEMIDE 40 MG PO TABS
40.0000 mg | ORAL_TABLET | Freq: Every day | ORAL | Status: DC
  Administered 2015-10-03 – 2015-10-05 (×2): 40 mg via ORAL
  Filled 2015-10-03 (×3): qty 1

## 2015-10-03 MED ORDER — LISINOPRIL 5 MG PO TABS
5.0000 mg | ORAL_TABLET | Freq: Every day | ORAL | Status: DC
  Administered 2015-10-03: 5 mg via ORAL
  Filled 2015-10-03: qty 1

## 2015-10-03 MED ORDER — METOPROLOL SUCCINATE ER 100 MG PO TB24
200.0000 mg | ORAL_TABLET | Freq: Every day | ORAL | Status: DC
  Administered 2015-10-03: 200 mg via ORAL
  Filled 2015-10-03 (×2): qty 2

## 2015-10-03 MED ORDER — CLOPIDOGREL BISULFATE 75 MG PO TABS
75.0000 mg | ORAL_TABLET | Freq: Every day | ORAL | Status: DC
  Administered 2015-10-03 – 2015-10-05 (×3): 75 mg via ORAL
  Filled 2015-10-03 (×3): qty 1

## 2015-10-03 MED ORDER — IPRATROPIUM-ALBUTEROL 0.5-2.5 (3) MG/3ML IN SOLN
3.0000 mL | Freq: Four times a day (QID) | RESPIRATORY_TRACT | Status: DC | PRN
Start: 2015-10-03 — End: 2015-10-04

## 2015-10-03 MED ORDER — SODIUM CHLORIDE 0.9% FLUSH
3.0000 mL | Freq: Two times a day (BID) | INTRAVENOUS | Status: DC
  Administered 2015-10-03 – 2015-10-05 (×4): 3 mL via INTRAVENOUS

## 2015-10-03 NOTE — Progress Notes (Signed)
Pt transferred to 2w11 from [place]. MD paged. VSS. Hooked to tele monitor. Pt c/o 2/10 CP that has been ongoing x50month. EKG preforrmed. MD at bedisde. Call bell and phone within reach. Will continue to monitor.

## 2015-10-03 NOTE — Progress Notes (Signed)
Called re: transfer for patient by Maryjean Ka at [facility] ER.  66 yo F hx chron systolic CHF with BiV/ICD EF 55% last in 2016 followed by Dr. Konrad Dolores Cardiology and Dr. Johney Frame EP, HTN, OSA on CPAP, COPD and CAD presents with chest pain. (Per chart review she had Cath with PCI in 2008, 2009, 2011, 2013 and 2014, none since).  He states that the patient has been having several weeks fatigue, SOB with exertion, acutely worse today and associated with substernal pain. Not initially relieved with nitro but resolved spontaneously in ER. Initial troponin negative. Vital WNL.  Requesting transfer due to lack of bed availability at [facility].  Aspirin given. Pain currently resolved.  To OBS tele.

## 2015-10-03 NOTE — H&P (Signed)
History and Physical    Becky SealsSharon K Geer [name] DOB: [birth year] DOA: 10/03/2015   PCP: Bedelia PersonAARON,CAREN T, MD   Patient coming from:  Home    Chief Complaint:  HPI: Becky SealsSharon K [name] is a 65 y.o. 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 [facility] and Dr. Johney FrameAllred EP, hypertension, obstructive sleep apnea on Cipro, COPD, CAD last catheterization with PCI in 2014, [facility] with several weeks of fatigue, shortness of breath with exertion, and substernal chest pain, intermittent for the last 4 weeks. Last night, around 11 PM, the patient's symptoms became more evident, for which she presented to the ED. SHe received ASA 324 mg and Morphine and  Nitroglicerin. Currently she is chest pain free. Pain notworsened with deep inspiration, movement , but does report intermittent chest pain with exertion. SHe has been having about 4 episodes a day over the last 2 weeks.Denies any dizziness or falls. Reports some acute on chronic dyspnea, worse on exertion, which she initially thought it was related to her CHF and had increased her Lasix. Denies any cough. Denies any fever or chills.Reports intermittent nausea without  Vomiting. No abdominal pain. Appetite is normal and eats salt rich foods. Denies any leg swelling or calf pain. Denies any headaches or vision changes. Denies any seizures or confusion. No recent long distance trips. Denies any new stressors. HAd changed some of her COPD meds on Friday. Not on hormonal therapy.  No new herbal supplements. Does not smoke. No ETOH or recreational drugs. Initial BNP was 13, and Tn is negative to 0.01. EKG without acute changes.     BP 128/70   Pulse 64   Temp 97.6 F (36.4 C)   Ht 5' (1.524 m)   Wt 90.2 kg (198 lb 13.7 oz)   SpO2 97%   BMI 38.84 kg/m      Review of Systems: As per HPI otherwise 10 point review of systems negative.   Past Medical  History:  Diagnosis Date  . Anxiety   . Arthritis    "all my joints; neck, back, legs, arms, elbows" (06/02/2013)  . Asthma   . CAD (coronary artery disease)    a. 5/08: s/p DES to PDA and DES to LAD; b. 1/09: s/p DES to pLAD; c. 1/11: s/p DES to LAD x 2, d. LHC (3[place]12 [place] - EF 55%, mild ant HK, nl LM, LAD stents ok, oLAD 30, RCA stents ok;  e.  4/13:  s/p DES to RCA, f. 1/14: dLM 20-30, mOM3 20-30, mLAD 80 ISR => s/p 2.5x16 mm Promus Element DES; EF 45-50%   . CHF (congestive heart failure) (HCC)   . Chronic bronchitis (HCC)   . Chronic lower back pain    "into pelvis region" (06/02/2013)  . COPD (chronic obstructive pulmonary disease) (HCC)   . Daily headache    "might be from sinus" (06/02/2013)  . Depression   . Fibromyalgia   . GERD (gastroesophageal reflux disease)   . HLD (hyperlipidemia)   . Hypertension   . Ischemic cardiomyopathy    a. Echo (11/14):  EF 25%   . LBBB (left bundle branch block)   . Myocardial infarction (HCC) 12/2012  . Syncope 11/14   LifeVest placed    Past Surgical History:  Procedure Laterality Date  . BI-VENTRICULAR IMPLANTABLE CARDIOVERTER DEFIBRILLATOR N/A 06/02/2013   Procedure: BI-VENTRICULAR IMPLANTABLE CARDIOVERTER DEFIBRILLATOR  (CRT-D);  Surgeon: Gardiner RhymeJames D Allred,  MD;  Location: [facility] CATH LAB;  Service: Cardiovascular;  Laterality: N/A;  . BI-VENTRICULAR IMPLANTABLE CARDIOVERTER DEFIBRILLATOR  (CRT-D)  06/02/2013   MDT VivaQuad CRTD implanted by Dr Johney Frame for ICM, CHF  . CARDIAC CATHETERIZATION  12/2012   S/P MI  . CORONARY ANGIOPLASTY WITH STENT PLACEMENT  2007; 2009; 2011   "?2 +1 +1 "  . CORONARY STENT PLACEMENT  02/2012  . HYSTEROSCOPY W/ ENDOMETRIAL ABLATION  ~ 1998  . LEAD REVISION  11-08-13   RA and LV lead revision by Dr Johney Frame  . LEAD REVISION Left 11/08/2013   Procedure: LEAD REVISION;  Surgeon: Gardiner Rhyme, MD;  Location: [facility] CATH LAB;  Service: Cardiovascular;  Laterality: Left;  . LEFT HEART CATHETERIZATION WITH CORONARY  ANGIOGRAM N/A 01/03/2013   Procedure: LEFT HEART CATHETERIZATION WITH CORONARY ANGIOGRAM;  Surgeon: Kathleene Hazel, MD;  Location: [facility] CATH LAB;  Service: Cardiovascular;  Laterality: N/A;  . ORIF ANKLE FRACTURE Right 10/21/2009  . TUBAL LIGATION  1989    Social History Social History   Social History  . Marital status: Married    Spouse name: N/A  . Number of children: N/A  . Years of education: N/A   Occupational History  . Not on file.   Social History Main Topics  . Smoking status: Current Every Day Smoker    Packs/day: 0.50    Years: 23.00    Types: Cigarettes    Start date: 03/09/1991  . Smokeless tobacco: Never Used  . Alcohol use 0.0 oz/week     Comment: 06/02/2013 "last alcohol was 2/31/2004"  . Drug use: No  . Sexual activity: Yes   Other Topics Concern  . Not on file   Social History Narrative   Lives in [place]     Allergies  Allergen Reactions  . Tape Rash and Other (See Comments)    Severe Reaction (latex tape): Burned hand, Redness-took days to clear up (03/02/12)  . Codeine Nausea And Vomiting  . Prozac [Fluoxetine Hcl] Other (See Comments)    Mean/violent thoughts  . Fosamax [Alendronate Sodium] Nausea Only and Other (See Comments)    Stomach pain  . Keflex [Cephalexin] Nausea Only  . Lipitor [Atorvastatin] Other (See Comments)    Leg pain    Family History  Problem Relation Age of Onset  . Breast cancer Mother   . Heart failure Brother   . Breast cancer Sister   . Breast cancer Sister   . Uterine cancer Sister       Prior to Admission medications   Medication Sig Start Date End Date Taking? Authorizing Provider  acetaminophen-codeine (TYLENOL #3) 300-30 MG tablet Take 1 tablet by mouth every 6 (six) hours as needed. 03/16/15   Historical Provider, MD  albuterol (PROAIR HFA) 108 (90 BASE) MCG/ACT inhaler Inhale 2 puffs into the lungs every 4 (four) hours as needed for shortness of breath.     Historical Provider, MD  aspirin 81 MG  tablet Take 81 mg by mouth daily.    Historical Provider, MD  Cholecalciferol (VITAMIN D) 2000 UNITS CAPS Take 2,000 Units by mouth daily.    Historical Provider, MD  clopidogrel (PLAVIX) 75 MG tablet Take 75 mg by mouth daily.  11/29/12   Historical Provider, MD  escitalopram (LEXAPRO) 20 MG tablet Take 20 mg by mouth daily.  11/29/12   Historical Provider, MD  furosemide (LASIX) 40 MG tablet TAKE 1-2 TABS DAILY 12/29/14   Antoine Poche, MD  [facility] 10 GM/15ML SOLN  Take 15 mLs by mouth daily as needed. 06/03/14   Historical Provider, MD  ipratropium-albuterol (DUONEB) 0.5-2.5 (3) MG/3ML SOLN Take 3 mLs by nebulization every 6 (six) hours as needed (shortness of breath).    Historical Provider, MD  lisinopril (PRINIVIL,ZESTRIL) 5 MG tablet TAKE 1 TABLET BY MOUTH EVERY DAY 01/05/15   Antoine Poche, MD  magnesium oxide (MAG-OX) 400 MG tablet Take 400 mg by mouth daily.    Historical Provider, MD  meclizine (ANTIVERT) 25 MG tablet Take 25 mg by mouth 3 (three) times daily as needed.  04/27/14   Historical Provider, MD  metoprolol (TOPROL-XL) 200 MG 24 hr tablet TAKE 1 TABLET BY MOUTH DAILY 07/31/15   Antoine Poche, MD  montelukast (SINGULAIR) 10 MG tablet Take 10 mg by mouth every morning.  11/29/12   Historical Provider, MD  nitroGLYCERIN (NITROSTAT) 0.4 MG SL tablet Place 1 tablet (0.4 mg total) under the tongue every 5 (five) minutes x 3 doses as needed. If no relief after 3rd dose, proceed to ED 12/29/14   Antoine Poche, MD  pantoprazole (PROTONIX) 40 MG tablet TAKE 1 TABLET BY MOUTH DAILY 07/30/15   Antoine Poche, MD  potassium chloride SA (K-DUR,KLOR-CON) 20 MEQ tablet Take 1 tablet (20 mEq total) by mouth every other day. 01/19/15   Antoine Poche, MD  pravastatin (PRAVACHOL) 40 MG tablet Take 40 mg by mouth daily.    Historical Provider, MD  rOPINIRole (REQUIP) 0.5 MG tablet Take 1 mg by mouth at bedtime.  11/29/12   Historical Provider, MD  SPIRIVA HANDIHALER 18 MCG  inhalation capsule Place 1 capsule into inhaler and inhale daily. 11/29/12   Historical Provider, MD  spironolactone (ALDACTONE) 25 MG tablet TAKE 1/2 TABLET BY MOUTH DAILY 01/05/15   Antoine Poche, MD    Physical Exam:    Vitals:   10/03/15 0917  BP: 128/70  Pulse: 64  Temp: 97.6 F (36.4 C)  SpO2: 97%  Weight: 90.2 kg (198 lb 13.7 oz)  Height: 5' (1.524 m)       Constitutional: NAD, calm, comfortable Vitals:   10/03/15 0917  BP: 128/70  Pulse: 64  Temp: 97.6 F (36.4 C)  SpO2: 97%  Weight: 90.2 kg (198 lb 13.7 oz)  Height: 5' (1.524 m)   Eyes: PERRL, lids and conjunctivae normal ENMT: Mucous membranes are moist. Posterior pharynx clear of any exudate or lesions.Normal dentition.  Neck: normal, supple, no masses, no thyromegaly. No JVD Respiratory: clear to auscultation bilaterally, no wheezing, no crackles. Normal respiratory effort. No accessory muscle use.  Cardiovascular: Regular rate and rhythm, paced  no murmurs / rubs / gallops. No extremity edema. 2+ pedal pulses. No carotid bruits.  Abdomen: obese,no tenderness, no masses palpated. No hepatosplenomegaly. Bowel sounds positive.  Musculoskeletal: no clubbing / cyanosis. No joint deformity upper and lower extremities. Good ROM, no contractures. Normal muscle tone.  Skin: no rashes, lesions, ulcers.  Neurologic: CN 2-12 grossly intact. Sensation intact, DTR normal. Strength 5/5 in all 4.  Psychiatric: Normal judgment and insight. Alert and oriented x 3. Normal mood.     Labs on Admission: I have personally reviewed following labs and imaging studies  CBC: No results for input(s): WBC, NEUTROABS, HGB, HCT, MCV, PLT in the last 168 hours.  Basic Metabolic Panel: No results for input(s): NA, K, CL, CO2, GLUCOSE, BUN, CREATININE, CALCIUM, MG, PHOS in the last 168 hours.  GFR: CrCl cannot be calculated (Patient's most recent lab result is older  than the maximum 21 days allowed.).   Urine analysis: No  results found for: COLORURINE, APPEARANCEUR, LABSPEC, PHURINE, GLUCOSEU, HGBUR, BILIRUBINUR, KETONESUR, PROTEINUR, UROBILINOGEN, NITRITE, LEUKOCYTESUR  Sepsis Labs: @LABRCNTIP (procalcitonin:4,lacticidven:4) )No results found for this or any previous visit (from the past 240 hour(s)).   Radiological Exams on Admission: No results found.  EKG: Independently reviewed.  Assessment/Plan Active Problems:   Coronary atherosclerosis of native coronary artery   Chest pain   Essential hypertension   Hyperlipidemia   Chronic systolic CHF (congestive heart failure) (HCC)   Morbid obesity (HCC)   Depression    Chest pain syndrome/known CAD  HEART score 5 . Troponin 0.01 , EKG without evidence of ACS Received  nitroglycerin, morphine, aspirin. Currently chest pain free.   BiV/ICD EF 55% last in 2016 followed by Dr. Konrad Dolores Cardiology and Dr. Johney Frame EP, HAs Gr1 DD  Admit to Telemetry/ Observation Chest pain order set Cycle troponins EKG in am CXR stat continue ASA, O2 and NTG as needed Will obtain Cardiology consultation, will defer cardiac meds to cards  GI cocktail Consult to Cards, patient will be kept NPO with sips for meds for now in case that cardiac catheterization is indicated.     Hypertension BP 128/70   Pulse 64  Controlled Continue home anti-hypertensive medications    Chronic combined  heart failure,  55 EF Gr  1DD.   BNP 13, current weight  198 lbs. Appears compensated.    - Daily weights, strict I/O   Continue diuresis   Hyperlipidemia Continue home meds  Depression Continue Lexapro   COPD, stable  O2 normal at room air  Continue CPAP and home inhalers.     DVT prophylaxis: Lovenox Code Status:   Full    Family Communication:  Discussed with patient  Disposition Plan: Expect patient to be discharged to home after condition improves Consults called:   Cardiology Admission status:Tele  Obs     [facility] E, PA-C [facility]   If  7PM-7AM, please contact night-coverage [email or web address] Password [email or web address]  10/03/2015, 10:18 AM

## 2015-10-03 NOTE — Consult Note (Signed)
Date: 10/03/2015               Patient Name:  Becky [name] MRN: [id number]  DOB: Jun 20, 1950 Age / Sex: 64 y.o., female   PCP: Bedelia Person, MD         Requesting Physician: Dr. Leatha Gilding, MD    Consulting Reason:  Chest Pain     Chief Complaint: Fatigue and chest pain X 1 month  History of Present Illness:Becky [name] is a 65 y.o. 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 Konrad Dolores Cardiology and Dr. Johney Frame EP, hypertension, obstructive sleep apnea on biPAP at night, COPD, CAD last catheterization with PCI in 2014, transferred from [facility] with several weeks of fatigue, shortness of breath with exertion, and intermittent substernal chest pain, for the last 4 weeks. Yesterday her symptoms gets worse and she went to ED, was given ASA, Morphine and nitroglycerine and transferred to [facility] for further evaluation. She states her chest pain is intermittent, sharp, crampy radiating to her back and mostly accompanied with some muscle spasms below her shoulder blade bilaterally, it is 5-6/10 when worse and currently it was 3/10 . Also c/o her chest pain occasionally accompanied with mild nausea, palpitation and dyspnoea. She sleeps on 2-3 pillows which has not changed recently and c/o getting up 2-3 X a night with the feeling of not getting enough oxygen and also C/O day time sleepiness and morning headaches. She has h/o chronic sinus congestion and dry cough but denies any recent exacerbation,denies any diaphoresis,fever,chills, night sweats . No recent change in her wt. Appetite, bowel or urinary habits.  Meds: Current Facility-Administered Medications  Medication Dose Route Frequency Provider Last Rate Last Dose  . acetaminophen (TYLENOL) tablet 650 mg  650 mg Oral Q4H PRN Marcos Eke, PA-C      . aspirin tablet 81 mg  81 mg Oral Daily Marcos Eke, PA-C      . bisacodyl (DULCOLAX) suppository 10 mg  10  mg Rectal Daily PRN Marcos Eke, PA-C      . clopidogrel (PLAVIX) tablet 75 mg  75 mg Oral Daily Marcos Eke, PA-C      . enoxaparin (LOVENOX) injection 40 mg  40 mg Subcutaneous Q24H Sara E Wertman, PA-C      . escitalopram (LEXAPRO) tablet 20 mg  20 mg Oral Daily Marcos Eke, PA-C      . furosemide (LASIX) tablet 40 mg  40 mg Oral Daily Marcos Eke, PA-C      . gi cocktail (Maalox,Lidocaine,Donnatal)  30 mL Oral QID PRN Marcos Eke, PA-C      . ipratropium-albuterol (DUONEB) 0.5-2.5 (3) MG/3ML nebulizer solution 3 mL  3 mL Nebulization Q6H PRN Marcos Eke, PA-C      . lisinopril (PRINIVIL,ZESTRIL) tablet 5 mg  5 mg Oral Daily Marcos Eke, PA-C      . magnesium citrate solution 1 Bottle  1 Bottle Oral Once PRN Marcos Eke, PA-C      . meclizine (ANTIVERT) tablet 25 mg  25 mg Oral TID PRN Marcos Eke, PA-C      . metoprolol succinate (TOPROL-XL) 24 hr tablet 200 mg  200 mg Oral Daily Marcos Eke, PA-C      . ondansetron [facility]) injection 4 mg  4 mg Intravenous Q6H PRN Marcos Eke, PA-C      . pantoprazole (  PROTONIX) EC tablet 40 mg  40 mg Oral Daily Marcos Eke, PA-C      . potassium chloride SA (K-DUR,KLOR-CON) CR tablet 20 mEq  20 mEq Oral QODAY Marcos Eke, PA-C      . pravastatin (PRAVACHOL) tablet 40 mg  40 mg Oral Daily Marcos Eke, PA-C      . rOPINIRole (REQUIP) tablet 1 mg  1 mg Oral QHS Sung Amabile Wertman, PA-C      . sodium chloride flush (NS) 0.9 % injection 3 mL  3 mL Intravenous Q12H Marcos Eke, PA-C      . spironolactone (ALDACTONE) tablet 12.5 mg  12.5 mg Oral Daily Marcos Eke, PA-C      . traZODone (DESYREL) tablet 25 mg  25 mg Oral QHS PRN Marcos Eke, PA-C        Allergies: Allergies as of 10/03/2015 - Review Complete 05/03/2015  Allergen Reaction Noted  . Tape Rash and Other (See Comments) 01/02/2013  . Codeine Nausea And Vomiting 12/13/2012  . Prozac [fluoxetine hcl] Other (See Comments) 12/13/2012  . Fosamax  [alendronate sodium] Nausea Only and Other (See Comments) 12/13/2012  . Keflex [cephalexin] Nausea Only 12/13/2012  . Lipitor [atorvastatin] Other (See Comments) 12/13/2012   Past Medical History:  Diagnosis Date  . Anxiety   . Arthritis    "all my joints; neck, back, legs, arms, elbows" (06/02/2013)  . Asthma   . CAD (coronary artery disease)    a. 5/08: s/p DES to PDA and DES to LAD; b. 1/09: s/p DES to pLAD; c. 1/11: s/p DES to LAD x 2, d. LHC (3/12 in [place] - EF 55%, mild ant HK, nl LM, LAD stents ok, oLAD 30, RCA stents ok;  e.  4/13:  s/p DES to RCA, f. 1/14: dLM 20-30, mOM3 20-30, mLAD 80 ISR => s/p 2.5x16 mm Promus Element DES; EF 45-50%   . CHF (congestive heart failure) (HCC)   . Chronic bronchitis (HCC)   . Chronic lower back pain    "into pelvis region" (06/02/2013)  . COPD (chronic obstructive pulmonary disease) (HCC)   . Daily headache    "might be from sinus" (06/02/2013)  . Depression   . Fibromyalgia   . GERD (gastroesophageal reflux disease)   . HLD (hyperlipidemia)   . Hypertension   . Ischemic cardiomyopathy    a. Echo (11/14):  EF 25%   . LBBB (left bundle branch block)   . Myocardial infarction (HCC) 12/2012  . Syncope 11/14   LifeVest placed   Past Surgical History:  Procedure Laterality Date  . BI-VENTRICULAR IMPLANTABLE CARDIOVERTER DEFIBRILLATOR N/A 06/02/2013   Procedure: BI-VENTRICULAR IMPLANTABLE CARDIOVERTER DEFIBRILLATOR  (CRT-D);  Surgeon: Gardiner Rhyme, MD;  Location: [facility] CATH LAB;  Service: Cardiovascular;  Laterality: N/A;  . BI-VENTRICULAR IMPLANTABLE CARDIOVERTER DEFIBRILLATOR  (CRT-D)  06/02/2013   MDT VivaQuad CRTD implanted by Dr Johney Frame for ICM, CHF  . CARDIAC CATHETERIZATION  12/2012   S/P MI  . CORONARY ANGIOPLASTY WITH STENT PLACEMENT  2007; 2009; 2011   "?2 +1 +1 "  . CORONARY STENT PLACEMENT  02/2012  . HYSTEROSCOPY W/ ENDOMETRIAL ABLATION  ~ 1998  . LEAD REVISION  11-08-13   RA and LV lead revision by Dr Johney Frame  . LEAD  REVISION Left 11/08/2013   Procedure: LEAD REVISION;  Surgeon: Gardiner Rhyme, MD;  Location: [facility] CATH LAB;  Service: Cardiovascular;  Laterality: Left;  . LEFT HEART CATHETERIZATION WITH CORONARY ANGIOGRAM N/A 01/03/2013  Procedure: LEFT HEART CATHETERIZATION WITH CORONARY ANGIOGRAM;  Surgeon: Kathleene Hazel, MD;  Location: [facility] CATH LAB;  Service: Cardiovascular;  Laterality: N/A;  . ORIF ANKLE FRACTURE Right 10/21/2009  . TUBAL LIGATION  1989   Family History  Problem Relation Age of Onset  . Breast cancer Mother   . Heart failure Brother   . Breast cancer Sister   . Breast cancer Sister   . Uterine cancer Sister    Social History   Social History  . Marital status: Married    Spouse name: N/A  . Number of children: N/A  . Years of education: N/A   Occupational History  . Not on file.   Social History Main Topics  . Smoking status: Current Every Day Smoker    Packs/day: 0.50    Years: 23.00    Types: Cigarettes    Start date: 03/09/1991  . Smokeless tobacco: Never Used  . Alcohol use 0.0 oz/week     Comment: 06/02/2013 "last alcohol was 2/31/2004"  . Drug use: No  . Sexual activity: Yes   Other Topics Concern  . Not on file   Social History Narrative   Lives in [place]    Review of Systems: Pertinent items are noted in HPI.  Physical Exam: Blood pressure 128/70, pulse 64, temperature 97.6 F (36.4 C), height 5' (1.524 m), weight 198 lb 13.7 oz (90.2 kg), SpO2 97 %. BP 128/70   Pulse 64   Temp 97.6 F (36.4 C)   Ht 5' (1.524 m)   Wt 198 lb 13.7 oz (90.2 kg)   SpO2 97%   BMI 38.84 kg/m   General Appearance:    Alert, cooperative, no distress, appears stated age  Head:    Normocephalic, without obvious abnormality, atraumatic  Eyes:    PERRL, conjunctiva/corneas clear, EOM's intact, fundi    benign, both eyes     Nose:   Nares normal, septum midline, mucosa normal, no drainage    or sinus tenderness  Throat:   Lips, mucosa, and tongue normal; teeth  and gums normal  Neck:   Supple, symmetrical, trachea midline, no adenopathy;    thyroid:  no enlargement/tenderness/nodules; no carotid   bruit or JVD  Back:     Symmetric, no curvature, ROM normal, no CVA tenderness  Lungs:     Clear to auscultation bilaterally, respirations unlabored  Chest Wall:    Mild  tenderness around her ICD.No  deformity   Heart:    Regular rate and rhythm, S1 and S2 normal, no murmur, rub   or gallop  Breast Exam:    No tenderness, masses, or nipple abnormality  Abdomen:     Soft, non-tender, bowel sounds active all four quadrants,    no masses, no organomegaly        Extremities:   Extremities normal, atraumatic, no cyanosis or edema  Pulses:   2+ and symmetric all extremities  Skin:   Skin color, texture, turgor normal, no rashes or lesions     Neurologic:   CNII-XII intact, normal strength, sensation and reflexes    throughout     Lab results: CBC Latest Ref Rng & Units 10/03/2015 11/08/2013 06/02/2013  WBC 4.0 - 10.5 K/uL 8.0 5.8 8.1  Hemoglobin 12.0 - 15.0 g/dL 15.3(H) 14.2 15.2(H)  Hematocrit 36.0 - 46.0 % 44.9 41.8 44.1  Platelets 150 - 400 K/uL 230 217 256   CMP Latest Ref Rng & Units 10/03/2015 05/26/2014 11/09/2013  Glucose 65 - 99 mg/dL  139(H) 91 132(H)  BUN 6 - 20 mg/dL 20 12 15   Creatinine 0.44 - 1.00 mg/dL 1.61(W) 9.60 4.54  Sodium 135 - 145 mmol/L 136 137 140  Potassium 3.5 - 5.1 mmol/L 3.4(L) 4.2 3.9  Chloride 101 - 111 mmol/L 102 102 104  CO2 22 - 32 mmol/L 26 26 26   Calcium 8.9 - 10.3 mg/dL 10.6(H) 10.2 9.7  Total Protein 6.5 - 8.1 g/dL 6.1(L) - -  Total Bilirubin 0.3 - 1.2 mg/dL 0.7 - -  Alkaline Phos 38 - 126 U/L 46 - -  AST 15 - 41 U/L 22 - -  ALT 14 - 54 U/L 33 - -   Trop. <0.03  Imaging results:  DG Chest: FINDINGS: AICD in place. Heart size and pulmonary vascularity are normal. The lungs are clear. No effusions. No acute osseous abnormality.  Aortic calcifications and coronary artery calcifications.  IMPRESSION: No  acute abnormalities. Aortic atherosclerosis.   Other results: EKG:  Atrial-paced rhythm Non-specific intra-ventricular conduction block Possible Lateral infarct , age undetermined Inferior infarct , age undetermined Abnormal ECG No acute change as compare to previous one on 12/2014.  Assessment, Plan, & Recommendations by Problem: 65 year old female with history of  Combined CHF, BiV/ICD, was brought from [facility] Room where she presented for chest pain.   Chest pain with H/O CAD:Her description of chest pain looks like musculoskeletal and she has no acute changes in her ECG and negative Trop. Her on going fatigue and SOB on exertion might be related to her worsening lung functions, but with her PMH of CAD with PCI, and ICD we will do a chemical stress test and repeat ECHO to reassess her ischemic heart disease -Continue home meds. ASA, Plavix and Toprol XL 200mg   CHF: She does not look volume overload clinically with a normal DG Chest and no crackles and edema on exam. -Check BNP -Continue with home lasix 40 mg daily. -Continue home spironolactone 12.5 mg daily  HTN: Looks controlled. -Continue home Lisinopril 5 mg daily.  Tobacco Abuse: She currently smoke about 1 pack/day and not willing to quit at this point. All risks were discussed.  OSA: She is still having PND even with BiPAP at night and C/O morning headaches, may need a reevaluation on her settings.  Obesity: She need education regarding her wt. And current medical problems. When went to her room, had a hardies food bag on her bed side.   Dispo: Disposition is deferred at this time, awaiting improvement of current medical problems. Anticipated discharge in approximately 1-2 day(s).   The patient does have a current PCP (Caren Nadara Mustard, MD) and does need an [facility] hospital follow-up appointment after discharge.  The patient does not have transportation limitations that hinder transportation to clinic  appointments.  Signed: Arnetha Courser, MD 10/03/2015, 11:43 AM   Patient seen, examined. Available data reviewed. Agree with findings, assessment, and plan as outlined by Dr Nelson Chimes. The patient has poor insight into her medical problems. She has CAD with previous PCI, obstructive sleep apnea, COPD with ongoing cigarette use, and morbid obesity. She presents now with chest pain with both typical and atypical features. Serial cardiac markers are negative. She also reports progressive exertional dyspnea but has clear lung fields on examination and her chest x-ray is clear.  On my exam, she is alert and oriented in no distress. Lungs are clear with prolonged expiratory phase. Jugular venous pressure is normal. Heart is regular rate and rhythm without murmur or gallop. Abdomen is  soft and obese, nontender. There is no pretibial edema.  EKG shows ventricular pacing.  The patient has chest pain with intermediate risk of acute coronary syndrome. She has both typical and atypical features of chest discomfort. I have recommended a Lexiscan Myoview stress test for further risk stratification. She was counseled about the need for lifestyle modification with tobacco cessation and dietary changes. We'll follow-up tomorrow after her stress test is completed. We'll also check an echo to assess progressive shortness of breath with known history of heart failure and previous biventricular ICD implantation.  Tonny BollmanMichael Ennis Heavner, M.D. 10/03/2015 2:40 PM

## 2015-10-04 ENCOUNTER — Observation Stay (HOSPITAL_BASED_OUTPATIENT_CLINIC_OR_DEPARTMENT_OTHER): Payer: Medicare Other

## 2015-10-04 ENCOUNTER — Observation Stay (HOSPITAL_COMMUNITY): Payer: Medicare Other

## 2015-10-04 DIAGNOSIS — I25110 Atherosclerotic heart disease of native coronary artery with unstable angina pectoris: Secondary | ICD-10-CM

## 2015-10-04 DIAGNOSIS — R079 Chest pain, unspecified: Secondary | ICD-10-CM | POA: Diagnosis not present

## 2015-10-04 DIAGNOSIS — I5042 Chronic combined systolic (congestive) and diastolic (congestive) heart failure: Secondary | ICD-10-CM

## 2015-10-04 DIAGNOSIS — I959 Hypotension, unspecified: Secondary | ICD-10-CM

## 2015-10-04 DIAGNOSIS — I509 Heart failure, unspecified: Secondary | ICD-10-CM

## 2015-10-04 DIAGNOSIS — J441 Chronic obstructive pulmonary disease with (acute) exacerbation: Secondary | ICD-10-CM | POA: Diagnosis not present

## 2015-10-04 DIAGNOSIS — R072 Precordial pain: Secondary | ICD-10-CM | POA: Diagnosis not present

## 2015-10-04 DIAGNOSIS — J449 Chronic obstructive pulmonary disease, unspecified: Secondary | ICD-10-CM

## 2015-10-04 DIAGNOSIS — I110 Hypertensive heart disease with heart failure: Secondary | ICD-10-CM | POA: Diagnosis not present

## 2015-10-04 DIAGNOSIS — Z720 Tobacco use: Secondary | ICD-10-CM

## 2015-10-04 DIAGNOSIS — R0789 Other chest pain: Secondary | ICD-10-CM | POA: Diagnosis not present

## 2015-10-04 LAB — CBC
HCT: 42.4 % (ref 36.0–46.0)
Hemoglobin: 14.1 g/dL (ref 12.0–15.0)
MCH: 31.7 pg (ref 26.0–34.0)
MCHC: 33.3 g/dL (ref 30.0–36.0)
MCV: 95.3 fL (ref 78.0–100.0)
Platelets: 233 K/uL (ref 150–400)
RBC: 4.45 MIL/uL (ref 3.87–5.11)
RDW: 13 % (ref 11.5–15.5)
WBC: 7.2 K/uL (ref 4.0–10.5)

## 2015-10-04 LAB — ECHOCARDIOGRAM COMPLETE
Height: 60 in
Weight: 3181.68 [oz_av]

## 2015-10-04 LAB — COMPREHENSIVE METABOLIC PANEL WITH GFR
ALT: 29 U/L (ref 14–54)
AST: 19 U/L (ref 15–41)
Albumin: 3.4 g/dL — ABNORMAL LOW (ref 3.5–5.0)
Alkaline Phosphatase: 39 U/L (ref 38–126)
Anion gap: 8 (ref 5–15)
BUN: 27 mg/dL — ABNORMAL HIGH (ref 6–20)
CO2: 24 mmol/L (ref 22–32)
Calcium: 10.3 mg/dL (ref 8.9–10.3)
Chloride: 102 mmol/L (ref 101–111)
Creatinine, Ser: 1.12 mg/dL — ABNORMAL HIGH (ref 0.44–1.00)
GFR calc Af Amer: 58 mL/min — ABNORMAL LOW
GFR calc non Af Amer: 50 mL/min — ABNORMAL LOW
Glucose, Bld: 136 mg/dL — ABNORMAL HIGH (ref 65–99)
Potassium: 3.6 mmol/L (ref 3.5–5.1)
Sodium: 134 mmol/L — ABNORMAL LOW (ref 135–145)
Total Bilirubin: 0.9 mg/dL (ref 0.3–1.2)
Total Protein: 5.5 g/dL — ABNORMAL LOW (ref 6.5–8.1)

## 2015-10-04 MED ORDER — REGADENOSON 0.4 MG/5ML IV SOLN
INTRAVENOUS | Status: AC
  Filled 2015-10-04: qty 5

## 2015-10-04 MED ORDER — TECHNETIUM TC 99M TETROFOSMIN IV KIT
10.0000 | PACK | Freq: Once | INTRAVENOUS | Status: AC | PRN
  Administered 2015-10-04: 10 via INTRAVENOUS

## 2015-10-04 MED ORDER — FLUTICASONE FUROATE-VILANTEROL 200-25 MCG/INH IN AEPB
1.0000 | INHALATION_SPRAY | Freq: Every day | RESPIRATORY_TRACT | Status: DC
  Administered 2015-10-04 – 2015-10-05 (×2): 1 via RESPIRATORY_TRACT
  Filled 2015-10-04: qty 28

## 2015-10-04 MED ORDER — IPRATROPIUM-ALBUTEROL 0.5-2.5 (3) MG/3ML IN SOLN
3.0000 mL | Freq: Four times a day (QID) | RESPIRATORY_TRACT | Status: DC
  Administered 2015-10-04 – 2015-10-05 (×4): 3 mL via RESPIRATORY_TRACT
  Filled 2015-10-04 (×4): qty 3

## 2015-10-04 MED ORDER — NICOTINE 21 MG/24HR TD PT24
21.0000 mg | MEDICATED_PATCH | Freq: Every day | TRANSDERMAL | Status: DC
  Administered 2015-10-04 – 2015-10-05 (×2): 21 mg via TRANSDERMAL
  Filled 2015-10-04 (×2): qty 1

## 2015-10-04 MED ORDER — REGADENOSON 0.4 MG/5ML IV SOLN
0.4000 mg | Freq: Once | INTRAVENOUS | Status: DC
  Filled 2015-10-04: qty 5

## 2015-10-04 MED ORDER — MONTELUKAST SODIUM 10 MG PO TABS
10.0000 mg | ORAL_TABLET | Freq: Every morning | ORAL | Status: DC
  Administered 2015-10-04 – 2015-10-05 (×2): 10 mg via ORAL
  Filled 2015-10-04 (×2): qty 1

## 2015-10-04 MED ORDER — METOPROLOL SUCCINATE ER 100 MG PO TB24
100.0000 mg | ORAL_TABLET | Freq: Every day | ORAL | Status: DC
  Administered 2015-10-05: 100 mg via ORAL
  Filled 2015-10-04: qty 1

## 2015-10-04 NOTE — Care Management Obs Status (Signed)
MEDICARE OBSERVATION STATUS NOTIFICATION   Patient Details  Name: Becky [name] MRN: [id number] Date of Birth: May 23, 1950   Medicare Observation Status Notification Given:  Yes    Darrold Span, RN 10/04/2015, 4:13 PM

## 2015-10-04 NOTE — Progress Notes (Signed)
Patient: Becky SealsSharon K Knierim / Admit Date: 10/03/2015 / Date of Encounter: 10/04/2015, 10:24 AM   Subjective: Feels bad this AM. Still difficult to breathe. Chest feels the same. "Heart feels weak."   Objective: Telemetry: paced Physical Exam: Blood pressure 99/66, pulse 60, temperature 97.6 F (36.4 C), temperature source Oral, resp. rate 16, height 5' (1.524 m), weight 198 lb 13.7 oz (90.2 kg), SpO2 95 %. General: Well developed, well nourished obese WF in no acute distress. Head: Normocephalic, atraumatic, sclera non-icteric, no xanthomas, nares are without discharge. Neck: Negative for carotid bruits. JVP not elevated. Lungs: Scattered rhonchi, no rales, decreased BS throughout. Breathing is unlabored. Heart: RRR S1 S2 without murmurs, rubs, or gallops.  Abdomen: Soft, non-tender, non-distended with normoactive bowel sounds. No rebound/guarding. Extremities: No clubbing or cyanosis. No edema. Distal pedal pulses are 2+ and equal bilaterally. Neuro: Alert and oriented X 3. Moves all extremities spontaneously. Psych:  Responds to questions appropriately with a normal affect.   Intake/Output Summary (Last 24 hours) at 10/04/15 1024 Last data filed at 10/04/15 0715  Gross per 24 hour  Intake              360 ml  Output             1700 ml  Net            -1340 ml    Inpatient Medications:  . aspirin EC  81 mg Oral Daily  . clopidogrel  75 mg Oral Daily  . enoxaparin (LOVENOX) injection  40 mg Subcutaneous Q24H  . escitalopram  20 mg Oral Daily  . furosemide  40 mg Oral Daily  . lisinopril  5 mg Oral Daily  . metoprolol  200 mg Oral Daily  . pantoprazole  40 mg Oral Daily  . potassium chloride SA  20 mEq Oral QODAY  . pravastatin  40 mg Oral q1800  . regadenoson      . regadenoson  0.4 mg Intravenous Once  . rOPINIRole  1 mg Oral QHS  . sodium chloride flush  3 mL Intravenous Q12H  . spironolactone  12.5 mg Oral Daily   Infusions:    Labs:  Recent Labs  10/03/15 1207  10/04/15 0235  NA 136 134*  K 3.4* 3.6  CL 102 102  CO2 26 24  GLUCOSE 139* 136*  BUN 20 27*  CREATININE 1.02* 1.12*  CALCIUM 10.6* 10.3    Recent Labs  10/03/15 1207 10/04/15 0235  AST 22 19  ALT 33 29  ALKPHOS 46 39  BILITOT 0.7 0.9  PROT 6.1* 5.5*  ALBUMIN 3.6 3.4*    Recent Labs  10/03/15 1207 10/04/15 0235  WBC 8.0 7.2  NEUTROABS 4.6  --   HGB 15.3* 14.1  HCT 44.9 42.4  MCV 94.3 95.3  PLT 230 233    Recent Labs  10/03/15 1207 10/03/15 1322 10/03/15 1625  TROPONINI <0.03 <0.03 <0.03   Invalid input(s): POCBNP No results for input(s): HGBA1C in the last 72 hours.   Radiology/Studies:  Dg Chest 2 View  Result Date: 10/03/2015 CLINICAL DATA:  Chest pain.  Congestive heart failure. EXAM: CHEST  2 VIEW COMPARISON:  11/09/2013 FINDINGS: AICD in place. Heart size and pulmonary vascularity are normal. The lungs are clear. No effusions. No acute osseous abnormality. Aortic calcifications and coronary artery calcifications. IMPRESSION: No acute abnormalities. Aortic atherosclerosis. Electronically Signed   By: Francene BoyersJames  Maxwell M.D.   On: 10/03/2015 12:43     Assessment and Plan  11F with chronic combined CHF, with BiV/ICD EF 55% last in 2016, hypertension, obstructive sleep apnea on biPAP at night, COPD, CAD s/p prior multiple PCIs (see hx), fibromyalgia, depression,morbid obesity, LBBB admitted with CP/SOB.  1. Chest pain/dyspnea with history of CAD - patient was for nuclear today. However, when she arrived down to nuc she was complaining of feeling SOB and lung sounds were rhonchorous with decreased BS throughout. She reports lots of coughing overnight. Additionally, BP was on the softer side. She reported feeling weak. Discussed with Dr. Excell Seltzer - as nuc is not emergent, will defer for today. Dr. Excell Seltzer will reassess early afternoon to determine whether or not we can retry stress images in AM. Sent message to Dr. Isidoro Donning informing her of the issue and to consider further  pulm rx to optimize status before proceeding with Lexiscan (which can bronchoconstrict COPD pts).  2. Chronic combined CHF - 2D echo is pending. BNP is only 10 so doubt CHF. See below regarding CHF meds.  3. HTN with hypotension -  BP running on softer side. I will d/c ACEI until we can further trend. Wrote care order to hold Lasix, spironolactone and high dose BB until seen by Dr. Excell Seltzer given hypotension.  4. Tobacco abuse - declines to quit at this time. Likely contributing to dyspnea.  Signed, Ronie Spies PA-C Pager: [phone]  Patient seen, examined. Available data reviewed. Agree with findings, assessment, and plan as outlined by Ronie Spies, PA-C. Exam reveals an alert oriented obese woman in no distress. Lungs have mild rhonchi but good air movement. Heart is regular rate and rhythm without murmur or gallop. Abdomen is soft and obese, nontender. There is no peripheral edema. I think the patient will be older complete the stress portion of her nuclear scan tomorrow morning as her lung exam is improved from earlier today. I'm not sure that congestive heart failure is playing a significant role in her symptoms. Will await 2-D echocardiogram from today. Her echo last year showed significant improvement in LV function. Hypotension may be impacting her fatigue and will reduce her metoprolol succinate dose and hold her ACE inhibitor for the time being.  Tonny Bollman, M.D. 10/04/2015 1:01 PM

## 2015-10-04 NOTE — Progress Notes (Signed)
[facility]                                                                              Patient Demographics  Becky [name], is a 65 y.o. female, DOB - March 28, 1950, QMV:[id number]  Admit date - 10/03/2015   Admitting Physician Alberteen Samhristopher P Danford, MD  Outpatient Primary MD for the patient is Bedelia PersonAARON,CAREN T, MD  Outpatient specialists:   LOS - 1  days     chief complaint: Chest pain      Brief summary   Becky SealsSharon K [name] is a 65 y.o. 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 and Dr. Johney FrameAllred EP, hypertension, obstructive sleep apnea on Cipro, COPD, CAD last catheterization with PCI in 2014, [facility] several weeks of fatigue, shortness of breath with exertion, and substernal chest pain, intermittent for the last 4 weeks. Last night, around 11 PM, the patient's symptoms became more evident, for which she presented to the ED. SHe received ASA 324 mg and Morphine and  Nitroglicerin. Pain notworsened with deep inspiration, movement , but does report intermittent chest pain with exertion. SReportedhe  4 episodes a day over the last 2 weeks.Denies any dizziness or falls. Reports some acute on chronic dyspnea, worse on exertion, which she initially thought it was related to her CHF and had increased her Lasix. No recent long distance trips. Had changed some of her COPD meds on Friday. Not on hormonal therapy.  Initial BNP was 13, and Tn is negative to 0.01. EKG without acute changes.     Assessment & Plan    Chest pain syndrome/known CAD  HEART score 5 .  - Troponins 3 negative  - BiV/ICD EF 55% last in 2016 followed [facility] and Dr. Johney FrameAllred EP - Stress test canceled today as patient was short of breath and wheezing - NPO after midnight - Continue aspirin, beta blocker, statin  COPD exacerbation with ongoing nicotine abuse, has  not been taking her inhalers due to insurance issues - Patient she had been wheezing for the last few days - Placed on scheduled nebs, Breo - Reports that she cannot take oral prednisone due to diarrhea  Hypertension  Continue metoprolol and spironolactone   Chronic combined  heart failure,  55 EF Gr  1DD.   BNP 13, current weight  198 lbs. Appears compensated.    - continue Lasix, Aldactone,  - strict I's and O's and daily weights,  Hyperlipidemia Continue home meds  Depression Continue Lexapro   OSA - CPAP at night  Nicotine abuse - Placed on nicotine patch, does not want to stop smoking, stated "i love my cigarettes"     Code Status: full  DVT Prophylaxis:  Lovenox Family Communication: Discussed in detail with the patient, all imaging results, lab results explained to the patient   Disposition Plan  Time Spent in minutes  25 minutes  Procedures:  None   Consultants:   cardiology  Antimicrobials:      Medications  Scheduled Meds: . aspirin EC  81 mg  Oral Daily  . clopidogrel  75 mg Oral Daily  . enoxaparin (LOVENOX) injection  40 mg Subcutaneous Q24H  . escitalopram  20 mg Oral Daily  . furosemide  40 mg Oral Daily  . metoprolol  200 mg Oral Daily  . pantoprazole  40 mg Oral Daily  . potassium chloride SA  20 mEq Oral QODAY  . pravastatin  40 mg Oral q1800  . regadenoson  0.4 mg Intravenous Once  . rOPINIRole  1 mg Oral QHS  . sodium chloride flush  3 mL Intravenous Q12H  . spironolactone  12.5 mg Oral Daily   Continuous Infusions:  PRN Meds:.acetaminophen, bisacodyl, gi cocktail, ipratropium-albuterol, magnesium citrate, meclizine, ondansetron (ZOFRAN) IV, oxyCODONE-acetaminophen, traZODone   Antibiotics   Anti-infectives    None        Subjective:   Becky [name] was seen and examined today.  No significant wheezing at the time of my examination. Patient denies dizziness, chest pain, abdominal pain, N/V/D/C, new weakness,  numbess, tingling. No acute events overnight.    Objective:   Vitals:   10/04/15 0107 10/04/15 0607 10/04/15 0931 10/04/15 1119  BP:  (!) 95/43 99/66 (!) 107/57  Pulse: 63 (!) 59 60 (!) 59  Resp: 18 16    Temp:  97.6 F (36.4 C)    TempSrc:  Oral    SpO2: 94% 95%    Weight:      Height:        Intake/Output Summary (Last 24 hours) at 10/04/15 1138 Last data filed at 10/04/15 0715  Gross per 24 hour  Intake              360 ml  Output             1700 ml  Net            -1340 ml     Wt Readings from Last 3 Encounters:  10/03/15 90.2 kg (198 lb 13.7 oz)  05/03/15 92.1 kg (203 lb)  02/23/15 92.1 kg (203 lb)     Exam  General: Alert and oriented x 3, NAD  HEENT:  PERRLA, EOMI, Anicteric Sclera, mucous membranes moist.   Neck: Supple, no JVD, no masses  Cardiovascular: S1 S2 auscultated, no rubs, murmurs or gallops. Regular rate and rhythm.  Respiratory: Clear to auscultation bilaterally, no wheezing, rales or rhonchi  Gastrointestinal: Soft, nontender, nondistended, + bowel sounds  Ext: no cyanosis clubbing or edema  Neuro: AAOx3, Cr N's II- XII. Strength 5/5 upper and lower extremities bilaterally  Skin: No rashes  Psych: Normal affect and demeanor, alert and oriented x3    Data Reviewed:  I have personally reviewed following labs and imaging studies  Micro Results No results found for this or any previous visit (from the past 240 hour(s)).  Radiology Reports Dg Chest 2 View  Result Date: 10/03/2015 CLINICAL DATA:  Chest pain.  Congestive heart failure. EXAM: CHEST  2 VIEW COMPARISON:  11/09/2013 FINDINGS: AICD in place. Heart size and pulmonary vascularity are normal. The lungs are clear. No effusions. No acute osseous abnormality. Aortic calcifications and coronary artery calcifications. IMPRESSION: No acute abnormalities. Aortic atherosclerosis. Electronically Signed   By: Francene Boyers M.D.   On: 10/03/2015 12:43    Lab Data:  CBC:  Recent  Labs Lab 10/03/15 1207 10/04/15 0235  WBC 8.0 7.2  NEUTROABS 4.6  --   HGB 15.3* 14.1  HCT 44.9 42.4  MCV 94.3 95.3  PLT 230 233   Basic  Metabolic Panel:  Recent Labs Lab 10/03/15 1207 10/04/15 0235  NA 136 134*  K 3.4* 3.6  CL 102 102  CO2 26 24  GLUCOSE 139* 136*  BUN 20 27*  CREATININE 1.02* 1.12*  CALCIUM 10.6* 10.3   GFR: Estimated Creatinine Clearance: 50.1 mL/min (by C-G formula based on SCr of 1.12 mg/dL). Liver Function Tests:  Recent Labs Lab 10/03/15 1207 10/04/15 0235  AST 22 19  ALT 33 29  ALKPHOS 46 39  BILITOT 0.7 0.9  PROT 6.1* 5.5*  ALBUMIN 3.6 3.4*   No results for input(s): LIPASE, AMYLASE in the last 168 hours. No results for input(s): AMMONIA in the last 168 hours. Coagulation Profile: No results for input(s): INR, PROTIME in the last 168 hours. Cardiac Enzymes:  Recent Labs Lab 10/03/15 1207 10/03/15 1322 10/03/15 1625  TROPONINI <0.03 <0.03 <0.03   BNP (last 3 results) No results for input(s): PROBNP in the last 8760 hours. HbA1C: No results for input(s): HGBA1C in the last 72 hours. CBG: No results for input(s): GLUCAP in the last 168 hours. Lipid Profile: No results for input(s): CHOL, HDL, LDLCALC, TRIG, CHOLHDL, LDLDIRECT in the last 72 hours. Thyroid Function Tests: No results for input(s): TSH, T4TOTAL, FREET4, T3FREE, THYROIDAB in the last 72 hours. Anemia Panel: No results for input(s): VITAMINB12, FOLATE, FERRITIN, TIBC, IRON, RETICCTPCT in the last 72 hours. Urine analysis:    Component Value Date/Time   COLORURINE YELLOW 10/03/2015 2207   APPEARANCEUR CLOUDY (A) 10/03/2015 2207   LABSPEC 1.030 10/03/2015 2207   PHURINE 5.5 10/03/2015 2207   GLUCOSEU NEGATIVE 10/03/2015 2207   HGBUR NEGATIVE 10/03/2015 2207   BILIRUBINUR SMALL (A) 10/03/2015 2207   KETONESUR NEGATIVE 10/03/2015 2207   PROTEINUR NEGATIVE 10/03/2015 2207   NITRITE NEGATIVE 10/03/2015 2207   LEUKOCYTESUR NEGATIVE 10/03/2015 2207      Becky [name] M.D. [facility] 10/04/2015, 11:38 AM  Pager: [phone] Between 7am to 7pm - call Pager - [phone]  After 7pm go to [email or web address] - password [email or web address]  Call night coverage person covering after 7pm

## 2015-10-04 NOTE — Progress Notes (Signed)
*  PRELIMINARY RESULTS* Echocardiogram 2D Echocardiogram has been performed.  Jeryl Columbia 10/04/2015, 12:15 PM

## 2015-10-04 NOTE — Progress Notes (Signed)
EKG done this am,  Went down to nuclear med for stress test @ 0755am   Governor Specking, RN

## 2015-10-05 DIAGNOSIS — R079 Chest pain, unspecified: Secondary | ICD-10-CM | POA: Diagnosis not present

## 2015-10-05 DIAGNOSIS — R0789 Other chest pain: Secondary | ICD-10-CM | POA: Diagnosis not present

## 2015-10-05 DIAGNOSIS — E785 Hyperlipidemia, unspecified: Secondary | ICD-10-CM

## 2015-10-05 DIAGNOSIS — I10 Essential (primary) hypertension: Secondary | ICD-10-CM

## 2015-10-05 DIAGNOSIS — R072 Precordial pain: Secondary | ICD-10-CM | POA: Diagnosis not present

## 2015-10-05 DIAGNOSIS — I5042 Chronic combined systolic (congestive) and diastolic (congestive) heart failure: Secondary | ICD-10-CM | POA: Diagnosis not present

## 2015-10-05 DIAGNOSIS — J441 Chronic obstructive pulmonary disease with (acute) exacerbation: Secondary | ICD-10-CM | POA: Diagnosis not present

## 2015-10-05 LAB — NM MYOCAR MULTI W/SPECT W/WALL MOTION / EF
CHL CUP NUCLEAR SSS: 12
CHL CUP RESTING HR STRESS: 70 {beats}/min
LV dias vol: 64 mL (ref 46–106)
LVSYSVOL: 29 mL
NUC STRESS TID: 1.29
RATE: 0.38
SDS: 6
SRS: 6

## 2015-10-05 MED ORDER — TECHNETIUM TC 99M TETROFOSMIN IV KIT
30.0000 | PACK | Freq: Once | INTRAVENOUS | Status: AC | PRN
  Administered 2015-10-05: 30 via INTRAVENOUS

## 2015-10-05 MED ORDER — PREDNISONE 10 MG PO TABS: ORAL_TABLET | ORAL | 0 refills | Status: DC

## 2015-10-05 MED ORDER — DOXYCYCLINE HYCLATE 100 MG PO TABS: 100.0000 mg | ORAL_TABLET | Freq: Two times a day (BID) | ORAL | 0 refills | Status: DC

## 2015-10-05 MED ORDER — METOPROLOL SUCCINATE ER 100 MG PO TB24: 100.0000 mg | ORAL_TABLET | Freq: Every day | ORAL | 3 refills | Status: DC

## 2015-10-05 MED ORDER — FUROSEMIDE 40 MG PO TABS: 40.0000 mg | ORAL_TABLET | Freq: Every day | ORAL | 3 refills | Status: DC

## 2015-10-05 MED ORDER — DOXYCYCLINE HYCLATE 100 MG PO TABS
100.0000 mg | ORAL_TABLET | Freq: Two times a day (BID) | ORAL | Status: DC
  Administered 2015-10-05: 100 mg via ORAL
  Filled 2015-10-05: qty 1

## 2015-10-05 MED ORDER — REGADENOSON 0.4 MG/5ML IV SOLN
INTRAVENOUS | Status: AC
  Filled 2015-10-05: qty 5

## 2015-10-05 MED ORDER — METHYLPREDNISOLONE SODIUM SUCC 125 MG IJ SOLR
125.0000 mg | Freq: Once | INTRAMUSCULAR | Status: AC
  Administered 2015-10-05: 125 mg via INTRAVENOUS
  Filled 2015-10-05: qty 2

## 2015-10-05 NOTE — Discharge Summary (Signed)
Physician Discharge Summary   Patient ID: Becky [name] MRN: [id number] DOB/AGE: Apr 24, 1950 65 y.o.  Admit date: 10/03/2015 Discharge date: 10/05/2015  Primary Care Physician:  Bedelia Person, MD  Discharge Diagnoses:    . Coronary atherosclerosis of native coronary artery . Atypical Chest pain   COPD exacerbation . Essential hypertension . Hyperlipidemia . Chronic combined systolic and diastolic CHF (congestive heart failure) (HCC) . Morbid obesity (HCC)   Consults: Cardiology  Recommendations for Outpatient Follow-up:  1. Per patient, she has been having difficulty in obtaining the inhalers and nebulizers. Pulmonology appointment outpatient has been scheduled 2. Please repeat CBC/BMET at next visit   DIET: Heart healthy diet    Allergies:   Allergies  Allergen Reactions  . Tape Rash and Other (See Comments)    Severe Reaction (latex tape): Burned hand, Redness-took days to clear up (03/02/12)  . Codeine Nausea And Vomiting  . Prozac [Fluoxetine Hcl] Other (See Comments)    Mean/violent thoughts  . Fosamax [Alendronate Sodium] Nausea Only and Other (See Comments)    Stomach pain  . Keflex [Cephalexin] Nausea Only  . Lipitor [Atorvastatin] Other (See Comments)    Leg pain     DISCHARGE MEDICATIONS: Current Discharge Medication List    START taking these medications   Details  doxycycline (VIBRA-TABS) 100 MG tablet Take 1 tablet (100 mg total) by mouth 2 (two) times daily. X 7days Qty: 14 tablet, Refills: 0    predniSONE (DELTASONE) 10 MG tablet Prednisone dosing: Take  Prednisone 40mg  (4 tabs) x 3 days, then taper to 30mg  (3 tabs) x 3 days, then 20mg  (2 tabs) x 3days, then 10mg  (1 tab) x 3days, then OFF. Qty: 30 tablet, Refills: 0      CONTINUE these medications which have CHANGED   Details  furosemide (LASIX) 40 MG tablet Take 1 tablet (40 mg total) by mouth daily. Qty: 30 tablet, Refills: 3    metoprolol (TOPROL-XL) 100 MG 24 hr tablet Take 1 tablet  (100 mg total) by mouth daily. Qty: 30 tablet, Refills: 3      CONTINUE these medications which have NOT CHANGED   Details  acetaminophen-codeine (TYLENOL #3) 300-30 MG tablet Take 1 tablet by mouth every 6 (six) hours as needed for moderate pain.  Refills: 2    aspirin 81 MG tablet Take 81 mg by mouth daily.    Cholecalciferol (VITAMIN D) 2000 UNITS CAPS Take 2,000 Units by mouth daily.    clopidogrel (PLAVIX) 75 MG tablet Take 75 mg by mouth daily.     escitalopram (LEXAPRO) 20 MG tablet Take 20 mg by mouth daily.     fluticasone furoate-vilanterol (BREO ELLIPTA) 200-25 MCG/INH AEPB Inhale 1 puff into the lungs daily.    GENERLAC 10 GM/15ML SOLN Take 15 mLs by mouth daily as needed (for constipation).  Refills: 5    ipratropium-albuterol (DUONEB) 0.5-2.5 (3) MG/3ML SOLN Take 3 mLs by nebulization every 6 (six) hours as needed (shortness of breath).    magnesium oxide (MAG-OX) 400 MG tablet Take 400 mg by mouth daily.    montelukast (SINGULAIR) 10 MG tablet Take 10 mg by mouth every morning.     nitroGLYCERIN (NITROSTAT) 0.4 MG SL tablet Place 1 tablet (0.4 mg total) under the tongue every 5 (five) minutes x 3 doses as needed. If no relief after 3rd dose, proceed to ED Qty: 25 tablet, Refills: 5    pantoprazole (PROTONIX) 40 MG tablet TAKE 1 TABLET BY MOUTH DAILY Qty: 30 tablet, Refills: 3  potassium chloride SA (K-DUR,KLOR-CON) 20 MEQ tablet Take 1 tablet (20 mEq total) by mouth every other day. Qty: 45 tablet, Refills: 3    pravastatin (PRAVACHOL) 40 MG tablet Take 40 mg by mouth every other day.     rOPINIRole (REQUIP) 0.5 MG tablet Take 1 mg by mouth at bedtime.     SPIRIVA HANDIHALER 18 MCG inhalation capsule Place 1 capsule into inhaler and inhale daily.    spironolactone (ALDACTONE) 25 MG tablet TAKE 1/2 TABLET BY MOUTH DAILY Qty: 15 tablet, Refills: 11    albuterol (PROAIR HFA) 108 (90 BASE) MCG/ACT inhaler Inhale 2 puffs into the lungs every 4 (four) hours as  needed for shortness of breath.       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 [facility] and Dr. Johney Frame EP, hypertension, obstructive sleep apnea on Cipro, COPD, CAD last catheterization with PCI in 2014, transferred from [facility] with several weeks of fatigue, shortness of breath with exertion, and substernal chest pain, intermittent for the last 4 weeks. Last night, around 11 PM, the patient's symptoms became more evident, for which she presented to the ED. SHe received ASA 324 mg and Morphine and Nitroglicerin. Pain notworsened with deep inspiration, movement , but does report intermittent chest pain with exertion. SReportedhe  4 episodes a day over the last 2 weeks.Denies any dizziness or falls. Reports some acute on chronic dyspnea, worse on exertion, which she initially thought it was related to her CHF and had increased her Lasix. No recent long distance trips. Had changed some of her COPD meds on Friday. Not on hormonal therapy. Initial BNP was 13, and Tn is negative to 0.01. EKG without acute changes.    Hospital Course:   Chest pain syndrome/known CAD HEART score 5.  - Troponins 3 negative  - BiV/ICD EF 55% last in 2016 followed by Dr. Konrad Dolores [facility]iology and Dr. Johney Frame EP - 2-D echo showed EF of 55-60%, grade 1 diastolic dysfunction, no lesion wall motion abnormalities - Cardiology recommended nuclear medicine stress test. Stress test showed low to steady EF 54% mid to apical and septal wall hypokinesis. Per cardiology, reduce metoprolol succinate 200 mg daily otherwise continue current vacations. - Continue aspirin, beta blocker,  statin  COPD exacerbation with ongoing nicotine abuse, has not been taking her inhalers due to insurance issues - Patient she had been wheezing for the last few days - She was placed on scheduled nebs, Breo, was given IV steroids, doxycycline. Placed on prednisone with taper. Although not in the allergies, patient reports that prednisone sometimes gives her diarrhea. - Per patient, she's been having difficulty obtaining inhalers, scheduled outpatient pulmonology follow-up.  Hypertension  Continue metoprolol and spironolactone   Chronic combined heart failure,55EF Gr 1DD. BNP 13, current weight 198 lbs. Appears compensated.  - continue Lasix, Aldactone,  -Currently compensated, negative balance of 1.7 L, weight down from 198lbs to 196 lbs   Hyperlipidemia Continue home meds  Depression Continue Lexapro   OSA - CPAP at night  Nicotine abuse - Placed on nicotine patch, does not want to stop smoking, stated "i love my  cigarettes"      Day of Discharge BP (!) 136/59 (BP Location: Right Arm)   Pulse 60   Temp 97.5 F (36.4 C) (Oral)   Resp 20   Ht 5' (1.524 m)   Wt 89.3 kg (196 lb 12.8 oz)   SpO2 99%   BMI 38.43 kg/m   Physical Exam: General: Alert and awake oriented x3 not in any acute distress. HEENT: anicteric sclera, pupils reactive to light and accommodation CVS: S1-S2 clear no murmur rubs or gallops Chest: clear to auscultation bilaterally, no wheezing rales or rhonchi Abdomen: soft nontender, nondistended, normal bowel sounds Extremities: no cyanosis, clubbing or edema noted bilaterally Neuro: Cranial nerves II-XII intact, no focal neurological deficits   The results of significant diagnostics from this hospitalization (including imaging, microbiology, ancillary and laboratory) are listed below for reference.    LAB RESULTS: Basic Metabolic Panel:  Recent Labs Lab 10/03/15 1207 10/04/15 0235  NA 136 134*  K 3.4* 3.6  CL 102 102   CO2 26 24  GLUCOSE 139* 136*  BUN 20 27*  CREATININE 1.02* 1.12*  CALCIUM 10.6* 10.3   Liver Function Tests:  Recent Labs Lab 10/03/15 1207 10/04/15 0235  AST 22 19  ALT 33 29  ALKPHOS 46 39  BILITOT 0.7 0.9  PROT 6.1* 5.5*  ALBUMIN 3.6 3.4*   No results for input(s): LIPASE, AMYLASE in the last 168 hours. No results for input(s): AMMONIA in the last 168 hours. CBC:  Recent Labs Lab 10/03/15 1207 10/04/15 0235  WBC 8.0 7.2  NEUTROABS 4.6  --   HGB 15.3* 14.1  HCT 44.9 42.4  MCV 94.3 95.3  PLT 230 233   Cardiac Enzymes:  Recent Labs Lab 10/03/15 1322 10/03/15 1625  TROPONINI <0.03 <0.03   BNP: Invalid input(s): POCBNP CBG: No results for input(s): GLUCAP in the last 168 hours.  Significant Diagnostic Studies:  Dg Chest 2 View  Result Date: 10/03/2015 CLINICAL DATA:  Chest pain.  Congestive heart failure. EXAM: CHEST  2 VIEW COMPARISON:  11/09/2013 FINDINGS: AICD in place. Heart size and pulmonary vascularity are normal. The lungs are clear. No effusions. No acute osseous abnormality. Aortic calcifications and coronary artery calcifications. IMPRESSION: No acute abnormalities. Aortic atherosclerosis. Electronically Signed   By: Francene BoyersJames  Maxwell M.D.   On: 10/03/2015 12:43    2D ECHO: Study Conclusions  - Left ventricle: The cavity size was normal. Systolic function was   normal. The estimated ejection fraction was in the range of 55%   to 60%. Wall motion was normal; there were no regional wall   motion abnormalities. Doppler parameters are consistent with   abnormal left ventricular relaxation (grade 1 diastolic   dysfunction). Doppler parameters are consistent with   indeterminate ventricular filling pressure. - Aortic valve: Transvalvular velocity was within the normal range.   There was no stenosis. There was no regurgitation. - Mitral valve: Transvalvular velocity was within the normal range.   There was no evidence for stenosis. There was no  regurgitation. - Left atrium: The atrium was moderately dilated. - Right ventricle: The cavity size was normal. Wall thickness was   normal. Systolic function was normal. - Tricuspid valve: There was no regurgitation.  Stress test  There was no ST segment deviation noted during stress.  No T wave inversion was noted during stress.  Defect 1: There is a medium defect of mild severity present in the basal inferior and mid inferior location. This defect is likely a result of bowel  loop attenuation artifact. No significant ischemia identified.  This is a low risk study. No significant ischemia identified.  Nuclear stress EF: 54%. Mid to apical septal wall hypokinesis noted   Donato Schultz, MD   Disposition and Follow-up:    DISPOSITION: Home   DISCHARGE FOLLOW-UP Follow-up Information    Bedelia Person, MD. Schedule an appointment as soon as possible for a visit in 2 week(s).   Specialty:  Internal Medicine Contact information: [address] [place] [address] [phone]        Canary Brim, NP Follow up on 10/10/2015.   Specialty:  Pulmonary Disease Why:  at 10:30AM. please arrive early for paperwork. Contact information: [address] [place] [address] [phone]            Time spent on Discharge: 25 minutes  Signed:   Marcellino Fidalgo M.D. [facility] 10/05/2015, 1:13 PM Pager: [phone]

## 2015-10-05 NOTE — Progress Notes (Signed)
Patient: Becky SealsSharon K [name] / Admit Date: 10/03/2015 / Date of Encounter: 10/05/2015, 9:14 AM   Subjective: Breathing better this am.   Objective: Telemetry: paced Physical Exam: Blood pressure (!) 104/57, pulse 61, temperature 97.5 F (36.4 C), temperature source Oral, resp. rate 20, height 5' (1.524 m), weight 196 lb 12.8 oz (89.3 kg), SpO2 96 %. General: obese WF in no acute distress. Head: Normocephalic, atraumatic, sclera non-icteric, no xanthomas, nares are without discharge. Neck: Negative for carotid bruits. JVP not elevated. Lungs: Scattered rhonchi, no rales, decreased BS throughout. Breathing is unlabored. Heart: RRR S1 S2 without murmurs, rubs, or gallops.  Abdomen: Soft, non-tender, non-distended with normoactive bowel sounds. No rebound/guarding. Extremities: No clubbing or cyanosis. No edema. Distal pedal pulses are 2+ and equal bilaterally. Neuro: Alert and oriented X 3. Moves all extremities spontaneously. Psych:  Responds to questions appropriately with a normal affect.   Intake/Output Summary (Last 24 hours) at 10/05/15 0914 Last data filed at 10/04/15 1630  Gross per 24 hour  Intake              240 ml  Output              650 ml  Net             -410 ml    Inpatient Medications:  . aspirin EC  81 mg Oral Daily  . clopidogrel  75 mg Oral Daily  . doxycycline  100 mg Oral Q12H  . enoxaparin (LOVENOX) injection  40 mg Subcutaneous Q24H  . escitalopram  20 mg Oral Daily  . fluticasone furoate-vilanterol  1 puff Inhalation Daily  . furosemide  40 mg Oral Daily  . ipratropium-albuterol  3 mL Nebulization Q6H  . methylPREDNISolone (SOLU-MEDROL) injection  125 mg Intravenous Once  . metoprolol  100 mg Oral Daily  . montelukast  10 mg Oral q morning - 10a  . nicotine  21 mg Transdermal Daily  . pantoprazole  40 mg Oral Daily  . potassium chloride SA  20 mEq Oral QODAY  . pravastatin  40 mg Oral q1800  . regadenoson      . regadenoson  0.4 mg Intravenous Once   . rOPINIRole  1 mg Oral QHS  . sodium chloride flush  3 mL Intravenous Q12H  . spironolactone  12.5 mg Oral Daily   Infusions:    Labs:  Recent Labs  10/03/15 1207 10/04/15 0235  NA 136 134*  K 3.4* 3.6  CL 102 102  CO2 26 24  GLUCOSE 139* 136*  BUN 20 27*  CREATININE 1.02* 1.12*  CALCIUM 10.6* 10.3    Recent Labs  10/03/15 1207 10/04/15 0235  AST 22 19  ALT 33 29  ALKPHOS 46 39  BILITOT 0.7 0.9  PROT 6.1* 5.5*  ALBUMIN 3.6 3.4*    Recent Labs  10/03/15 1207 10/04/15 0235  WBC 8.0 7.2  NEUTROABS 4.6  --   HGB 15.3* 14.1  HCT 44.9 42.4  MCV 94.3 95.3  PLT 230 233    Recent Labs  10/03/15 1207 10/03/15 1322 10/03/15 1625  TROPONINI <0.03 <0.03 <0.03   Invalid input(s): POCBNP No results for input(s): HGBA1C in the last 72 hours.   Radiology/Studies:  Dg Chest 2 View  Result Date: 10/03/2015 CLINICAL DATA:  Chest pain.  Congestive heart failure. EXAM: CHEST  2 VIEW COMPARISON:  11/09/2013 FINDINGS: AICD in place. Heart size and pulmonary vascularity are normal. The lungs are clear. No effusions. No acute osseous abnormality.  Aortic calcifications and coronary artery calcifications. IMPRESSION: No acute abnormalities. Aortic atherosclerosis. Electronically Signed   By: Francene Boyers M.D.   On: 10/03/2015 12:43     Assessment and Plan  55F with chronic combined CHF, with BiV/ICD EF 55% last in 2016, hypertension, obstructive sleep apnea on biPAP at night, COPD, CAD s/p prior multiple PCIs (see hx), fibromyalgia, depression,morbid obesity, LBBB admitted with CP/SOB.  1. Chest pain/dyspnea with history of CAD - Pt for Myoview today  2. Chronic combined CHF - 2D echo is pending. BNP is only 10 so doubt CHF.   3. HTN with hypotension -  BP running on softer side. Medications adjusted  4. Tobacco abuse - declines to quit at this time. Likely contributing to dyspnea.  Plan: Myoview this am. tolerated well, final report pending.   Corine Shelter  PA-C 10/05/2015 9:16 AM  Patient seen, examined. Available data reviewed. Agree with findings, assessment, and plan as outlined by Corine Shelter, PA-C. Echo reviewed and shows normal LV function: Study Conclusions  - Left ventricle: The cavity size was normal. Systolic function was   normal. The estimated ejection fraction was in the range of 55%   to 60%. Wall motion was normal; there were no regional wall   motion abnormalities. Doppler parameters are consistent with   abnormal left ventricular relaxation (grade 1 diastolic   dysfunction). Doppler parameters are consistent with   indeterminate ventricular filling pressure. - Aortic valve: Transvalvular velocity was within the normal range.   There was no stenosis. There was no regurgitation. - Mitral valve: Transvalvular velocity was within the normal range.   There was no evidence for stenosis. There was no regurgitation. - Left atrium: The atrium was moderately dilated. - Right ventricle: The cavity size was normal. Wall thickness was   normal. Systolic function was normal. - Tricuspid valve: There was no regurgitation  Nuclear scan: Study Result     There was no ST segment deviation noted during stress.  No T wave inversion was noted during stress.  Defect 1: There is a medium defect of mild severity present in the basal inferior and mid inferior location. This defect is likely a result of bowel loop attenuation artifact. No significant ischemia identified.  This is a low risk study. No significant ischemia identified.  Nuclear stress EF: 54%. Mid to apical septal wall hypokinesis noted   Donato Schultz, MD    Exam reveals alert oriented woman in NAD> Lungs clear, heart regular rate and rhythm, no peripheral edema.  Medication changes as outlined yesterday. Metoprolol succinate dose reduced to 100 mg daily. Otherwise would continue current medicines.   Tonny Bollman, M.D. 10/05/2015 12:19 PM

## 2015-10-05 NOTE — Progress Notes (Signed)
Order received to discharge patient.  Patient expresses readiness to discharge.  Telemetry monitor was removed and CCMD notified.  PIV removed.  Discharge instructions, follow up, medications and instructions for their use were discussed with a patient and patient voiced understanding.

## 2015-10-09 ENCOUNTER — Telehealth: Payer: Self-pay

## 2015-10-09 NOTE — Telephone Encounter (Signed)
Received call from patient.  She stated she was hospitalized for CHF and discharged on 10/05/2015. Her medications were changed and Furosemide was decreased to 40 mg daily. She stated her son in law shot and killed himself over the weekend and has been very upsetting weekend.  She will send a ICM remote transmission this evening and can be reached by cell phone tomorrow.

## 2015-10-10 ENCOUNTER — Telehealth: Payer: Self-pay

## 2015-10-10 ENCOUNTER — Inpatient Hospital Stay: Payer: Medicare Other | Admitting: Pulmonary Disease

## 2015-10-10 NOTE — Telephone Encounter (Signed)
Received call from patient.  She tried sending a remote transmission but was getting error message.  Provided [facility] tech support number and encouraged her to call.  She will call as soon as she can but she has been dealing with the death of her son in law.  Advised to call when she is able to send a transmission.

## 2015-10-12 NOTE — Progress Notes (Signed)
Patient unable to send ICM remote transmission for 10/10/2015.  Next ICM transmission scheduled for 10/17/2015.

## 2015-10-15 ENCOUNTER — Ambulatory Visit (INDEPENDENT_AMBULATORY_CARE_PROVIDER_SITE_OTHER): Payer: Medicare Other

## 2015-10-15 DIAGNOSIS — Z95810 Presence of automatic (implantable) cardiac defibrillator: Secondary | ICD-10-CM

## 2015-10-15 DIAGNOSIS — I5022 Chronic systolic (congestive) heart failure: Secondary | ICD-10-CM

## 2015-10-15 NOTE — Progress Notes (Signed)
EPIC Encounter for ICM Monitoring  Patient Name: Becky [name] is a 65 y.o. female Date: 10/15/2015 Primary Care Physican: Bedelia Person, MD Primary Cardiologist: Branch Electrophysiologist: Allred Dry Weight: 196 lb  Bi-V Pacing:  98.2%       Heart Failure questions reviewed, pt symptomatic with small amount of stomach bloating.  Patient hospitalized 10/03/2015 to 10/05/2015 for COPD/HF exacerbation.  Thoracic impedance abnormal suggesting fluid accumulation 10/10/2015 to 10/15/2015 and trending toward baseline 10/15/2015.  Recommendations:  Patient manages fluid symptoms by taking 1 extra Furosemide 40 mg prn.  She stated she took extra tablet yesterday and today.  Advised she may take an extra Furosemide 40 mg tomorrow if stomach bloating is not resolved.  Advised to take extra Potassium 20 meq when taking extra Furosemide.  She verbalized understanding.      Follow-up plan: ICM clinic phone appointment on 10/18/2015.  Copy of ICM check sent to primary cardiologist and device physician for review.   ICM trend: 10/15/2015       Karie Soda, RN 10/15/2015 12:32 PM

## 2015-10-18 ENCOUNTER — Ambulatory Visit (INDEPENDENT_AMBULATORY_CARE_PROVIDER_SITE_OTHER): Payer: Medicare Other

## 2015-10-18 DIAGNOSIS — I5022 Chronic systolic (congestive) heart failure: Secondary | ICD-10-CM

## 2015-10-18 DIAGNOSIS — Z95810 Presence of automatic (implantable) cardiac defibrillator: Secondary | ICD-10-CM

## 2015-10-18 NOTE — Progress Notes (Signed)
EPIC Encounter for ICM Monitoring  Patient Name: Becky [name] is a 65 y.o. female Date: 10/18/2015 Primary Care Physican: Bedelia Person, MD Primary Cardiologist:Branch Electrophysiologist: Allred Dry Weight: 195 lb  Bi-V Pacing:  97.6%       Heart Failure questions reviewed, pt asymptomatic   Thoracic impedance returned to normal and above normal which correlates with extra Lasix taken for 2 days.  Recommendations: No changes.  Low sodium diet education provided.    Follow-up plan: ICM clinic phone appointment on 11/14/2015.  Office appointment with Dr Wyline Mood 11/15/2015.  Copy of ICM check sent to primary cardiologist and device physician.   ICM trend: 10/18/2015       Karie Soda, RN 10/18/2015 1:38 PM

## 2015-11-14 ENCOUNTER — Ambulatory Visit (INDEPENDENT_AMBULATORY_CARE_PROVIDER_SITE_OTHER): Payer: Medicare Other

## 2015-11-14 ENCOUNTER — Telehealth: Payer: Self-pay

## 2015-11-14 DIAGNOSIS — Z95810 Presence of automatic (implantable) cardiac defibrillator: Secondary | ICD-10-CM | POA: Diagnosis not present

## 2015-11-14 DIAGNOSIS — I5022 Chronic systolic (congestive) heart failure: Secondary | ICD-10-CM | POA: Diagnosis not present

## 2015-11-14 NOTE — Progress Notes (Signed)
EPIC Encounter for ICM Monitoring  Patient Name: Becky [name] is a 65 y.o. female Date: 11/14/2015 Primary Care Physican: Bedelia Person, MD Primary Cardiologist:Branch Electrophysiologist: Allred Dry Weight: unknown Bi-V Pacing:  97.5%       Attempted ICM call and unable to reach.  Left detailed message regarding transmission.  Transmission reviewed.   Thoracic impedance normal on 11/14/2015.  Impedance has many peaks and valleys along reference line suggesting days with fluid retention.  Follow-up plan: ICM clinic phone appointment on 12/18/2015.  Appointment with Dr Wyline Mood tomorrow, 11/15/2015.   Copy of ICM check sent to primary cardiologist and device physician.   ICM trend: 11/14/2015       Karie Soda, RN 11/14/2015 8:29 AM

## 2015-11-14 NOTE — Telephone Encounter (Signed)
Remote ICM transmission received.  Attempted patient call and left detailed message regarding transmission and next ICM scheduled for 12/18/2015.  Advised to return call for any fluid symptoms or questions.

## 2015-11-15 ENCOUNTER — Encounter: Payer: Self-pay | Admitting: Cardiology

## 2015-11-15 ENCOUNTER — Ambulatory Visit (INDEPENDENT_AMBULATORY_CARE_PROVIDER_SITE_OTHER): Payer: Medicare Other | Admitting: Cardiology

## 2015-11-15 VITALS — BP 121/78 | HR 68 | Ht 60.0 in | Wt 196.6 lb

## 2015-11-15 DIAGNOSIS — I10 Essential (primary) hypertension: Secondary | ICD-10-CM | POA: Diagnosis not present

## 2015-11-15 DIAGNOSIS — I5022 Chronic systolic (congestive) heart failure: Secondary | ICD-10-CM

## 2015-11-15 DIAGNOSIS — I2589 Other forms of chronic ischemic heart disease: Secondary | ICD-10-CM | POA: Diagnosis not present

## 2015-11-15 DIAGNOSIS — E785 Hyperlipidemia, unspecified: Secondary | ICD-10-CM

## 2015-11-15 DIAGNOSIS — I255 Ischemic cardiomyopathy: Secondary | ICD-10-CM | POA: Diagnosis not present

## 2015-11-15 MED ORDER — METOPROLOL SUCCINATE ER 100 MG PO TB24: 100.0000 mg | ORAL_TABLET | Freq: Every day | ORAL | 3 refills | Status: DC

## 2015-11-15 NOTE — Patient Instructions (Signed)
Your physician recommends that you schedule a follow-up appointment in: 3 MONTHS WITH DR BRANCH  Your physician recommends that you continue on your current medications as directed. Please refer to the Current Medication list given to you today.  Thank you for choosing [facility]!!    

## 2015-11-15 NOTE — Progress Notes (Signed)
Clinical Summary Becky [name] is a 65 y.o.female seen today for follow up of the following medical problems.  1. ICM/Chronic systolic heart failure - history of multiple PCIs as described below, mainly in [place].  - 04/2013 LVEF 30-35%. Repeat echo after BiV AICD 05/2014 shows LVEF 55%, grade I diastolic dysfunction.  - 09/2015 echo LVEF 55-60%, no WMAs, grade I diastolic dysfunction - medical therapy has been limited by orthostatic symtpoms - she has been on long term plavix due to her extensive stent burden    - admit 09/2015 with chest pain and SOB, COPD exacerbation, CHF exacerbation - nuclear stress 09/2015 without significant ischemia.  - diuresed 1.7 liters, discharge weight 196 lbs.  - Toprol lowered to 100mg  daily during that admission due to soft bp's.   - since hospital discharge has had some chest pain she associates with home stress, son in law recently committed suicide. No chest pain over the last few days. - compliant with meds   2. OSA  - compliant with CPAP  3. Hyperlipidemia - muscle aches on multiple statins incluing on pravatastatin and lovastatin in the past, currently on zetia - 04/2015 TC 223 TG 224 HDL 45 LDL133.  - tolerating pravastatin every other day without significant side effects.    4. HTN - compliant with meds  5. COPD - has pulmonary in [place] as new patient coming up   Past Medical History:  Diagnosis Date  . AICD (automatic cardioverter/defibrillator) present   . Anxiety   . Arthritis    "all my joints; neck, back, legs, arms, elbows"  (10/03/2015)  . Asthma   . CAD (coronary artery disease)    a. 5/08: s/p DES to PDA and DES to LAD; b. 1/09: s/p DES to pLAD; c. 1/11: s/p DES to LAD x 2, d. LHC (3/12 in [place] - EF 55%, mild ant HK, nl LM, LAD stents ok, oLAD 30, RCA stents ok;  e.  4/13:  s/p DES to RCA, f. 1/14: dLM 20-30, mOM3 20-30, mLAD 80 ISR => s/p 2.5x16 mm Promus Element DES; EF 45-50%   . CHF  (congestive heart failure) (HCC)   . Chronic bronchitis (HCC)   . Chronic lower back pain    "into pelvis region" (10/03/2015)  . COPD (chronic obstructive pulmonary disease) (HCC)   . Daily headache    "might be from sinus" (10/03/2015)  . Depression    hx (10/03/2015)  . Fibromyalgia   . GERD (gastroesophageal reflux disease)   . Heart murmur   . HLD (hyperlipidemia)   . Hypertension   . Ischemic cardiomyopathy    a. Echo (11/14):  EF 25%   . LBBB (left bundle Yvette Roark block)   . Myocardial infarction (HCC) 12/2012  . OSA on CPAP   . Pneumonia 1950s  . Syncope 11/14   LifeVest placed  . Upper back pain, chronic    "into my shoulders; fibromyalgia works here" (10/03/2015)     Allergies  Allergen Reactions  . Tape Rash and Other (See Comments)    Severe Reaction (latex tape): Burned hand, Redness-took days to clear up (03/02/12)  . Codeine Nausea And Vomiting  . Prozac [Fluoxetine Hcl] Other (See Comments)    Mean/violent thoughts  . Fosamax [Alendronate Sodium] Nausea Only and Other (See Comments)    Stomach pain  . Keflex [Cephalexin] Nausea Only  . Lipitor [Atorvastatin] Other (See Comments)    Leg pain     Current Outpatient Prescriptions  Medication  Sig Dispense Refill  . acetaminophen-codeine (TYLENOL #3) 300-30 MG tablet Take 1 tablet by mouth every 6 (six) hours as needed for moderate pain.   2  . albuterol (PROAIR HFA) 108 (90 BASE) MCG/ACT inhaler Inhale 2 puffs into the lungs every 4 (four) hours as needed for shortness of breath.     Marland Kitchen. aspirin 81 MG tablet Take 81 mg by mouth daily.    . Cholecalciferol (VITAMIN D) 2000 UNITS CAPS Take 2,000 Units by mouth daily.    . clopidogrel (PLAVIX) 75 MG tablet Take 75 mg by mouth daily.     Marland Kitchen. doxycycline (VIBRA-TABS) 100 MG tablet Take 1 tablet (100 mg total) by mouth 2 (two) times daily. X 7days 14 tablet 0  . escitalopram (LEXAPRO) 20 MG tablet Take 20 mg by mouth daily.     . fluticasone furoate-vilanterol (BREO  ELLIPTA) 200-25 MCG/INH AEPB Inhale 1 puff into the lungs daily.    . furosemide (LASIX) 40 MG tablet Take 1 tablet (40 mg total) by mouth daily. 30 tablet 3  . GENERLAC 10 GM/15ML SOLN Take 15 mLs by mouth daily as needed (for constipation).   5  . ipratropium-albuterol (DUONEB) 0.5-2.5 (3) MG/3ML SOLN Take 3 mLs by nebulization every 6 (six) hours as needed (shortness of breath).    . magnesium oxide (MAG-OX) 400 MG tablet Take 400 mg by mouth daily.    . metoprolol (TOPROL-XL) 100 MG 24 hr tablet Take 1 tablet (100 mg total) by mouth daily. 30 tablet 3  . montelukast (SINGULAIR) 10 MG tablet Take 10 mg by mouth every morning.     . nitroGLYCERIN (NITROSTAT) 0.4 MG SL tablet Place 1 tablet (0.4 mg total) under the tongue every 5 (five) minutes x 3 doses as needed. If no relief after 3rd dose, proceed to ED 25 tablet 5  . pantoprazole (PROTONIX) 40 MG tablet TAKE 1 TABLET BY MOUTH DAILY (Patient taking differently: Take 40 mg by mouth daily) 30 tablet 3  . potassium chloride SA (K-DUR,KLOR-CON) 20 MEQ tablet Take 1 tablet (20 mEq total) by mouth every other day. 45 tablet 3  . pravastatin (PRAVACHOL) 40 MG tablet Take 40 mg by mouth every other day.     . predniSONE (DELTASONE) 10 MG tablet Prednisone dosing: Take  Prednisone 40mg  (4 tabs) x 3 days, then taper to 30mg  (3 tabs) x 3 days, then 20mg  (2 tabs) x 3days, then 10mg  (1 tab) x 3days, then OFF. 30 tablet 0  . rOPINIRole (REQUIP) 0.5 MG tablet Take 1 mg by mouth at bedtime.     Marland Kitchen. SPIRIVA HANDIHALER 18 MCG inhalation capsule Place 1 capsule into inhaler and inhale daily.    Marland Kitchen. spironolactone (ALDACTONE) 25 MG tablet TAKE 1/2 TABLET BY MOUTH DAILY (Patient taking differently: Take 12.5 mg by mouth daily) 15 tablet 11   No current facility-administered medications for this visit.      Past Surgical History:  Procedure Laterality Date  . BI-VENTRICULAR IMPLANTABLE CARDIOVERTER DEFIBRILLATOR N/A 06/02/2013   Procedure: BI-VENTRICULAR  IMPLANTABLE CARDIOVERTER DEFIBRILLATOR  (CRT-D);  Surgeon: Gardiner RhymeJames D Allred, MD;  [facility];  Service: Cardiovascular;  Laterality: N/A;  . BI-VENTRICULAR IMPLANTABLE CARDIOVERTER DEFIBRILLATOR  (CRT-D)  06/02/2013   MDT VivaQuad CRTD implanted by Dr Johney FrameAllred for ICM, CHF  . CARDIAC CATHETERIZATION  12/2012   S/P MI  . CORONARY ANGIOPLASTY WITH STENT PLACEMENT  2007; 2009; 2011   "?2 +1 +1 "  . CORONARY ANGIOPLASTY WITH STENT PLACEMENT  02/2012  .  FRACTURE SURGERY    . HYSTEROSCOPY W/ ENDOMETRIAL ABLATION  ~ 1998  . LEAD REVISION  11-08-13   RA and LV lead revision by Dr Johney Frame  . LEAD REVISION Left 11/08/2013   Procedure: LEAD REVISION;  Surgeon: Gardiner Rhyme, MD;  Location: [facility] C[facility] LAB;  Service: Cardiovascular;  Laterality: Left;  . LEFT HEART CATHETERIZATION WITH CORONARY ANGIOGRAM N/A 01/03/2013   Procedure: LEFT HEART CATHETERIZATION WITH CORONARY ANGIOGRAM;  Surgeon: Kathleene Hazel, MD;  Location[facility] C[facility] LAB;  Service: Cardiovascular;  Laterality: N/A;  . ORIF ANKLE FRACTURE Right 10/21/2009  . TUBAL LIGATION  1989     Allergies  Allergen Reactions  . Tape Rash and Other (See Comments)    Severe Reaction (latex tape): Burned hand, Redness-took days to clear up (03/02/12)  . Codeine Nausea And Vomiting  . Prozac [Fluoxetine Hcl] Other (See Comments)    Mean/violent thoughts  . Fosamax [Alendronate Sodium] Nausea Only and Other (See Comments)    Stomach pain  . Keflex [Cephalexin] Nausea Only  . Lipitor [Atorvastatin] Other (See Comments)    Leg pain      Family History  Problem Relation Age of Onset  . Breast cancer Mother   . Heart failure Brother   . Breast cancer Sister   . Breast cancer Sister   . Uterine cancer Sister      Social History Ms. Niday reports that she has been smoking Cigarettes.  She started smoking about 24 years ago. She has a 15.00 pack-year smoking history. She has never used smokeless tobacco. Ms. Skidgel reports that she  drinks alcohol.   Review of Systems CONSTITUTIONAL: No weight loss, fever, chills, weakness or fatigue.  HEENT: Eyes: No visual loss, blurred vision, double vision or yellow sclerae.No hearing loss, sneezing, congestion, runny nose or sore throat.  SKIN: No rash or itching.  CARDIOVASCULAR: per HPI RESPIRATORY: No shortness of breath, cough or sputum.  GASTROINTESTINAL: No anorexia, nausea, vomiting or diarrhea. No abdominal pain or blood.  GENITOURINARY: No burning on urination, no polyuria NEUROLOGICAL: No headache, dizziness, syncope, paralysis, ataxia, numbness or tingling in the extremities. No change in bowel or bladder control.  MUSCULOSKELETAL: No muscle, back pain, joint pain or stiffness.  LYMPHATICS: No enlarged nodes. No history of splenectomy.  PSYCHIATRIC: No history of depression or anxiety.  ENDOCRINOLOGIC: No reports of sweating, cold or heat intolerance. No polyuria or polydipsia.  Marland Kitchen   Physical Examination Vitals:   11/15/15 0820  BP: 121/78  Pulse: 68   Vitals:   11/15/15 0820  Weight: 196 lb 9.6 oz (89.2 kg)  Height: 5' (1.524 m)    Gen: resting comfortably, no acute distress HEENT: no scleral icterus, pupils equal round and reactive, no palptable cervical adenopathy,  CV: RRR, no m/r/g, no jvd Resp: Clear to auscultation bilaterally GI: abdomen is soft, non-tender, non-distended, normal bowel sounds, no hepatosplenomegaly MSK: extremities are warm, no edema.  Skin: warm, no rash Neuro:  no focal deficits Psych: appropriate affect   Diagnostic Studies 04/2010 [place], [place]: LVEF 55%, mild anterior hypokinesis. LM normal, LAD with stents in prox and mid portion widely patent, LAD ostial 30%. Diags with luminal irregs. LCX with luminial irregs. RCA with distal patent stents  Stent cards  May 2008 DES to PDA, DES to LAD  Jan 2009 DES to prox LAD  Mar 05 2009 DES to LAD x2  05/2011 DES to RCA  Jan 2014 stent done [place], [place]  03/2010  Echo: LVEF 40%,  hypokinesis of the anteroseptum.   01/02/13 Cath  Hemodynamic Findings:  Central aortic pressure: 147/79  Left ventricular pressure: 130/22/26  Angiographic Findings:  Left main: 30% distal stenosis. It appears that the distal left main has a stent that continues into the LAD.  Left Anterior Descending Artery: Large vessel that courses to the apex. The entire proximal and mid LAD is stented. There is mild stent restenosis in the proximal segment. The mid stented segment has diffuse 30% stent restenosis. The distal vessel becomes small in caliber and has mild diffuse plaque. There is a moderate caliber first diagonal Dan Dissinger with mild plaque disease.  Circumflex Artery: Moderate caliber non-dominant vessel with three moderate caliber obtuse marginal branches. No obstructive disease.  Right Coronary Artery: Large dominant vessel with 30% proximal stenosis, heavily calcified mid vessel with patent stent in the mid vessel (no significant restenosis). The distal vessel has diffuse 40-50% stenosis. The posterolateral Scot Shiraishi and PDA are moderate caliber, patent vessels with mild plaque disease.  Left Ventricular Angiogram: LVEF=25-30%. Hypokinesis of the antero-apical wall.  Impression:  1. 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 Elevated troponin following syncopal event. No culprit lesions seen.  3. Moderate to severe LV systolic dysfunction.  Recommendations: Continue medical management of CAD. Workup for syncope is underway. Carotid dopplers today. Will check d-dimer as PE is a possibility. She could also have had an arrythmia with LV dysfunction.  Complications: None. The patient tolerated the procedure well.  01/03/13 Echo  - LVEF 25-30%, akinesis of the anteroseptal and apical myocardium, grade I diastolic dysfunction,  01/03/13 Carotid US  - The vertebral arteries appear patent with antegrade flow. - Findings consistent with1- 39 percent  stenosis,high end of scale,involving the right internal carotid artery. - Findings consistent with 40 - 59 percent stenosis, low end of scale,involving the left internal carotid artery. - ICA/CCA ratio. right =1.72. left = 1.52 Other specific details can be found in the table(s) above. Prepared and Electronically Authenticated by  12/13/12 Event monitor  No symptoms reported, sinus rhythm with conduction delay, occasional PVCs.  04/2013 Echo  Study Conclusions  - Left ventricle: The cavity size was normal. Wall thickness was increased in a pattern of mild LVH with moderate basal septal hypertrophy. Systolic function was moderately to severely reduced. The estimated ejection fraction was in the range of 30% to 35%. There is akinesis to dyskinesis of the mid-distal anteroseptal and apical myocardium. There is akinesis of the distalinferoseptal myocardium. Doppler parameters are consistent with abnormal left ventricular relaxation (grade 1 diastolic dysfunction). - Ventricular septum: Septal motion showed abnormal function and dyssynergy. - Aortic valve: Mildly calcified annulus. Probably trileaflet; mildly calcified leaflets. No significant regurgitation. - Mitral valve: Calcified annulus. Trivial regurgitation. - Left atrium: The atrium was mildly dilated. - Right atrium: Central venous pressure: 8mm Hg (est). - Tricuspid valve: Trivial regurgitation. - Pulmonary arteries: PA peak pressure: 20mm Hg (S). - Pericardium, extracardiac: There was no pericardial effusion. Impressions:  - MildLVH with moderate basal septal hypertrophy, LVEF 30-35% with wall motion abnormalities as outlined, grade 1 diastolic dysfunction. Mild left atrial enlagement. MAC with trivial mitral regurgitation. MIldlysclerotic aortic valve. Trivial tricuspid regurgitation with normal PASP 20 mmHg.  05/2014 echo Study Conclusions  - Left ventricle: Technically limited study. The cavity size  was normal. Wall thickness was increased in a pattern of moderate LVH. The estimated ejection fraction was 55%. Doppler parameters are consistent with abnormal left ventricular relaxation (grade 1 diastolic dysfunction). - Aortic  valve: Sclerosis without stenosis. - Right ventricle: The cavity size was normal. Pacer wire or catheter noted in right ventricle. Systolic function was normal. - Impressions: Since the study in 2015, there is definite improvement in LV function.  Impressions:  - Since the study in 2015, there is definite improvement in LV function.   09/2015 Nuclear Stress  There was no ST segment deviation noted during stress.  No T wave inversion was noted during stress.  Defect 1: There is a medium defect of mild severity present in the basal inferior and mid inferior location. This defect is likely a result of bowel loop attenuation artifact. No significant ischemia identified.  This is a low risk study. No significant ischemia identified.  Nuclear stress EF: 54%. Mid to apical septal wall hypokinesis noted   09/2015 echo Study Conclusions  - Left ventricle: The cavity size was normal. Systolic function was   normal. The estimated ejection fraction was in the range of 55%   to 60%. Wall motion was normal; there were no regional wall   motion abnormalities. Doppler parameters are consistent with   abnormal left ventricular relaxation (grade 1 diastolic   dysfunction). Doppler parameters are consistent with   indeterminate ventricular filling pressure. - Aortic valve: Transvalvular velocity was within the normal range.   There was no stenosis. There was no regurgitation. - Mitral valve: Transvalvular velocity was within the normal range.   There was no evidence for stenosis. There was no regurgitation. - Left atrium: The atrium was moderately dilated. - Right ventricle: The cavity size was normal. Wall thickness was   normal. Systolic function  was normal. - Tricuspid valve: There was no regurgitation. Assessment and Plan   1. ICM/Chronic systolic heart failure  - previous LVEF 30-35%. After BiV AICD repeat echo with normalized LVEF - medical therapy has been limited due to orthstatic symptoms, continue current meds - indefinite plavix due to large stent burden -recent atypical stress related chest pain, nuclear stress 09/2015 without significant ischemia - continue current meds  2. OSA - continue CPAP  3. Hyperlipidemia - difficulty tolerating statins in the past. Tolerating pravastatin every other day, we will continue.   4. HTN -at goal, she will continue current meds   F/u 3 months  Antoine Poche, M.D.

## 2015-11-29 ENCOUNTER — Other Ambulatory Visit: Payer: Self-pay | Admitting: Cardiology

## 2015-12-18 ENCOUNTER — Ambulatory Visit (INDEPENDENT_AMBULATORY_CARE_PROVIDER_SITE_OTHER): Payer: Medicare Other | Admitting: *Deleted

## 2015-12-18 DIAGNOSIS — I5022 Chronic systolic (congestive) heart failure: Secondary | ICD-10-CM | POA: Diagnosis not present

## 2015-12-18 DIAGNOSIS — Z95810 Presence of automatic (implantable) cardiac defibrillator: Secondary | ICD-10-CM

## 2015-12-18 DIAGNOSIS — I255 Ischemic cardiomyopathy: Secondary | ICD-10-CM

## 2015-12-18 NOTE — Progress Notes (Signed)
EPIC Encounter for ICM Monitoring  Patient Name: Becky [name] is a 65 y.o. female Date: 12/18/2015 Primary Care Physican: Bedelia Person, MD Primary Cardiologist:Branch Electrophysiologist: Allred Dry Weight:    unknown Bi-V Pacing:  97.4%                  Heart Failure questions reviewed, pt symptomatic with feet swelling but improved today.    Thoracic impedance returned to normal 12/18/2015 but has abnormal suggesting fluid accumulation for the last month.  Recommendations:  Patient has already taken extra Furosemide today and will take one tomorrow.    Advised to limit daily salt intake to 2000 mg and fluid intake to 64 oz.   Follow-up plan: ICM clinic phone appointment on 01/18/2016.    Copy of ICM check sent to primary cardiologist and device physician.   ICM trend: 12/18/2015       Karie Soda, RN 12/18/2015 11:58 AM

## 2015-12-19 NOTE — Progress Notes (Signed)
Remote ICD transmission.   

## 2015-12-26 ENCOUNTER — Encounter: Payer: Self-pay | Admitting: Cardiology

## 2015-12-31 ENCOUNTER — Other Ambulatory Visit: Payer: Self-pay | Admitting: Cardiology

## 2016-01-04 NOTE — Progress Notes (Signed)
ICM remote transmission rescheduled from 01/18/2016 to 01/21/2016 

## 2016-01-17 LAB — CUP PACEART REMOTE DEVICE CHECK
Brady Statistic AP VP Percent: 34.21 %
Brady Statistic AS VP Percent: 64.18 %
Brady Statistic RV Percent Paced: 10.51 %
HIGH POWER IMPEDANCE MEASURED VALUE: 88 Ohm
Implantable Lead Implant Date: 20150416
Implantable Lead Location: 753858
Implantable Lead Location: 753860
Implantable Lead Model: 4598
Implantable Lead Model: 5076
Lead Channel Impedance Value: 323 Ohm
Lead Channel Impedance Value: 323 Ohm
Lead Channel Impedance Value: 456 Ohm
Lead Channel Impedance Value: 456 Ohm
Lead Channel Impedance Value: 646 Ohm
Lead Channel Impedance Value: 703 Ohm
Lead Channel Pacing Threshold Amplitude: 0.625 V
Lead Channel Pacing Threshold Amplitude: 0.875 V
Lead Channel Pacing Threshold Amplitude: 2.25 V
Lead Channel Pacing Threshold Pulse Width: 0.4 ms
Lead Channel Pacing Threshold Pulse Width: 0.8 ms
Lead Channel Sensing Intrinsic Amplitude: 3.375 mV
Lead Channel Sensing Intrinsic Amplitude: 8.75 mV
Lead Channel Setting Sensing Sensitivity: 0.3 mV
MDC IDC LEAD IMPLANT DT: 20150922
MDC IDC LEAD IMPLANT DT: 20150922
MDC IDC LEAD LOCATION: 753859
MDC IDC LEAD MODEL: 6935
MDC IDC MSMT BATTERY REMAINING LONGEVITY: 51 mo
MDC IDC MSMT BATTERY VOLTAGE: 2.97 V
MDC IDC MSMT LEADCHNL LV IMPEDANCE VALUE: 323 Ohm
MDC IDC MSMT LEADCHNL LV IMPEDANCE VALUE: 380 Ohm
MDC IDC MSMT LEADCHNL LV IMPEDANCE VALUE: 513 Ohm
MDC IDC MSMT LEADCHNL LV IMPEDANCE VALUE: 532 Ohm
MDC IDC MSMT LEADCHNL LV IMPEDANCE VALUE: 703 Ohm
MDC IDC MSMT LEADCHNL RA PACING THRESHOLD PULSEWIDTH: 0.4 ms
MDC IDC MSMT LEADCHNL RA SENSING INTR AMPL: 3.375 mV
MDC IDC MSMT LEADCHNL RV IMPEDANCE VALUE: 304 Ohm
MDC IDC MSMT LEADCHNL RV IMPEDANCE VALUE: 380 Ohm
MDC IDC MSMT LEADCHNL RV SENSING INTR AMPL: 8.75 mV
MDC IDC PG IMPLANT DT: 20150416
MDC IDC SESS DTM: 20171031041603
MDC IDC SET LEADCHNL LV PACING AMPLITUDE: 2.5 V
MDC IDC SET LEADCHNL LV PACING PULSEWIDTH: 0.8 ms
MDC IDC SET LEADCHNL RA PACING AMPLITUDE: 2 V
MDC IDC SET LEADCHNL RV PACING AMPLITUDE: 2 V
MDC IDC SET LEADCHNL RV PACING PULSEWIDTH: 0.4 ms
MDC IDC STAT BRADY AP VS PERCENT: 0.59 %
MDC IDC STAT BRADY AS VS PERCENT: 1.01 %
MDC IDC STAT BRADY RA PERCENT PACED: 34.8 %

## 2016-01-21 ENCOUNTER — Ambulatory Visit (INDEPENDENT_AMBULATORY_CARE_PROVIDER_SITE_OTHER): Payer: Medicare Other

## 2016-01-21 DIAGNOSIS — I5022 Chronic systolic (congestive) heart failure: Secondary | ICD-10-CM | POA: Diagnosis not present

## 2016-01-21 DIAGNOSIS — Z95810 Presence of automatic (implantable) cardiac defibrillator: Secondary | ICD-10-CM | POA: Diagnosis not present

## 2016-01-21 NOTE — Progress Notes (Signed)
EPIC Encounter for ICM Monitoring  Patient Name: Becky [name] is a 65 y.o. female Date: 01/21/2016 Primary Care Physican: Joseph Art, MD Primary Cardiologist:Branch Electrophysiologist: Allred Dry Weight:184 lbs Bi-V Pacing:  98.1%       Heart Failure questions reviewed, patients symptoms have resolved since she was hospitalized in [place] from 01/15/2016 to 01/21/2016 for chronic bronchitis, cold, sinuitis and her weight was up to 198 lbs.  Thoracic impedance returned to normal after hospitalization.  Was abnormal suggesting fluid accumulation 11/23 until today.  She stated she ate a lot of ham over the holiday and didn't think about how it would effect her.  She is feeling better.  Labs: 10/04/2015 Creatinine 1.12, BUN 27, Potassium 3.6, Sodium 134, EGFR 50-58 10/03/2015 Creatinine 1.02, BUN 20, Potassium 3.4, Sodium 136, EGFR 56->60  05/26/2015 Creatinine 1.01, BUN 12, Potassium 4.2, Sodium 137  Recommendations:  Discussed weighing daily so she can monitor for fluid weight and home health provided a scale.   She stated she may take an extra Lasix today because she feels like she still may have some fluid retention.  Reinforced low salt food choices especially for the holidays. Encouraged to call for fluid symptoms.    Follow-up plan: ICM clinic phone appointment on 02/07/2016 and she has office appointment with Dr Harl Bowie on 02/08/2016.  She stated she needs refill on Potassium she visits Dr Harl Bowie.  Advised her to take all her med bottles to appointment.   Copy of ICM check sent to primary cardiologist and device physician.   ICM trend: 01/21/2016       Rosalene Billings, RN 01/21/2016 11:36 AM

## 2016-02-07 ENCOUNTER — Ambulatory Visit (INDEPENDENT_AMBULATORY_CARE_PROVIDER_SITE_OTHER): Payer: Medicare Other

## 2016-02-07 ENCOUNTER — Telehealth: Payer: Self-pay

## 2016-02-07 DIAGNOSIS — Z95810 Presence of automatic (implantable) cardiac defibrillator: Secondary | ICD-10-CM

## 2016-02-07 DIAGNOSIS — I5022 Chronic systolic (congestive) heart failure: Secondary | ICD-10-CM

## 2016-02-07 NOTE — Telephone Encounter (Signed)
Remote ICM transmission received.  Attempted patient call and left detailed message to return call regarding transmission  

## 2016-02-07 NOTE — Progress Notes (Signed)
EPIC Encounter for ICM Monitoring  Patient Name: Becky [name] is a 65 y.o. female Date: 02/07/2016 Primary Care Physican: Joseph Art, MD Primary Cardiologist:Branch Electrophysiologist: Allred Dry Weight:unknown Bi-V Pacing:  97.8%            Attempted ICM call and unable to reach.  Left message to return call regarding transmission.  Transmission reviewed.   Thoracic impedance below reference line and abnormal suggesting fluid accumulation since 12/14.  Impedance trending with multiple peaks and valleys.    Labs: 10/04/2015 Creatinine 1.12, BUN 27, Potassium 3.6, Sodium 134, EGFR 50-58 10/03/2015 Creatinine 1.02, BUN 20, Potassium 3.4, Sodium 136, EGFR 56->60  05/26/2015 Creatinine 1.01, BUN 12, Potassium 4.2, Sodium 137  Recommendations: NONE-Left message to return call  Follow-up plan: ICM clinic phone appointment on 02/14/2016.  Office appointment with Dr Harl Bowie on 02/08/2016  Copy of ICM check sent to primary cardiologist and device physician.   ICM trend: 02/07/2016 for last 90 days       Rosalene Billings, RN 02/07/2016 2:05 PM

## 2016-02-08 ENCOUNTER — Encounter: Payer: Self-pay | Admitting: Cardiology

## 2016-02-08 ENCOUNTER — Encounter: Payer: Self-pay | Admitting: *Deleted

## 2016-02-08 ENCOUNTER — Ambulatory Visit (INDEPENDENT_AMBULATORY_CARE_PROVIDER_SITE_OTHER): Payer: Medicare Other | Admitting: Cardiology

## 2016-02-08 VITALS — BP 125/70 | HR 66 | Ht 60.0 in | Wt 193.0 lb

## 2016-02-08 DIAGNOSIS — I255 Ischemic cardiomyopathy: Secondary | ICD-10-CM | POA: Diagnosis not present

## 2016-02-08 DIAGNOSIS — I2510 Atherosclerotic heart disease of native coronary artery without angina pectoris: Secondary | ICD-10-CM

## 2016-02-08 DIAGNOSIS — I5022 Chronic systolic (congestive) heart failure: Secondary | ICD-10-CM

## 2016-02-08 DIAGNOSIS — I10 Essential (primary) hypertension: Secondary | ICD-10-CM

## 2016-02-08 DIAGNOSIS — E782 Mixed hyperlipidemia: Secondary | ICD-10-CM | POA: Diagnosis not present

## 2016-02-08 MED ORDER — PRAVASTATIN SODIUM 40 MG PO TABS: 40.0000 mg | ORAL_TABLET | Freq: Every evening | ORAL | 3 refills | Status: DC

## 2016-02-08 MED ORDER — FUROSEMIDE 40 MG PO TABS: ORAL_TABLET | ORAL | 3 refills | Status: DC

## 2016-02-08 MED ORDER — POTASSIUM CHLORIDE CRYS ER 20 MEQ PO TBCR: 20.0000 meq | EXTENDED_RELEASE_TABLET | ORAL | 3 refills | Status: DC

## 2016-02-08 NOTE — Progress Notes (Signed)
Clinical Summary Becky [name] is a 65 y.o.female seen today for follow up of the following medical problems.  1. ICM/Chronic systolic heart failure - history of multiple PCIs as described below, mainly in [place].  - 04/2013 LVEF 30-35%. Repeat echo after BiV AICD 05/2014 shows LVEF 55%, grade I diastolic dysfunction.  - 09/2015 echo LVEF 55-60%, no WMAs, grade I diastolic dysfunction - medical therapy has been limited by orthostatic symtpoms -she has been on long term plavix due to her extensive stent burden   - some change in ICD impedance suggesting fluid accumulation.  - has had some recent LE edema. Home weights around 186 lbs, which is her baseline.  - mild chest discomfort with heavy exertion overall stable    2. OSA  - remains compliant with CPAP  3. Hyperlipidemia - muscle aches on multiple statins - 04/2015 TC 223 TG 224 HDL 45 LDL133.  - tolerating pravastatin every other day  4. HTN - she is compliant with meds  5. COPD - followed by pulmonary in [place]  - recent admit in [facility] 12/2015 for SOB and COPD exacerbation    Past Medical History:  Diagnosis Date  . AICD (automatic cardioverter/defibrillator) present   . Anxiety   . Arthritis    "all my joints; neck, back, legs, arms, elbows"  (10/03/2015)  . Asthma   . CAD (coronary artery disease)    a. 5/08: s/p DES to PDA and DES to LAD; b. 1/09: s/p DES to pLAD; c. 1/11: s/p DES to LAD x 2, d. LHC (3/12 in [place] - EF 55%, mild ant HK, nl LM, LAD stents ok, oLAD 30, RCA stents ok;  e.  4/13:  s/p DES to RCA, f. 1/14: dLM 20-30, mOM3 20-30, mLAD 80 ISR => s/p 2.5x16 mm Promus Element DES; EF 45-50%   . CHF (congestive heart failure) (HCC)   . Chronic bronchitis (HCC)   . Chronic lower back pain    "into pelvis region" (10/03/2015)  . COPD (chronic obstructive pulmonary disease) (HCC)   . Daily headache    "might be from sinus" (10/03/2015)  . Depression    hx (10/03/2015)    . Fibromyalgia   . GERD (gastroesophageal reflux disease)   . Heart murmur   . HLD (hyperlipidemia)   . Hypertension   . Ischemic cardiomyopathy    a. Echo (11/14):  EF 25%   . LBBB (left bundle Samer Dutton block)   . Myocardial infarction 12/2012  . OSA on CPAP   . Pneumonia 1950s  . Syncope 11/14   LifeVest placed  . Upper back pain, chronic    "into my shoulders; fibromyalgia works here" (10/03/2015)     Allergies  Allergen Reactions  . Tape Rash and Other (See Comments)    Severe Reaction (latex tape): Burned hand, Redness-took days to clear up (03/02/12)  . Codeine Nausea And Vomiting  . Prozac [Fluoxetine Hcl] Other (See Comments)    Mean/violent thoughts  . Fosamax [Alendronate Sodium] Nausea Only and Other (See Comments)    Stomach pain  . Keflex [Cephalexin] Nausea Only  . Lipitor [Atorvastatin] Other (See Comments)    Leg pain     Current Outpatient Prescriptions  Medication Sig Dispense Refill  . acetaminophen-codeine (TYLENOL #3) 300-30 MG tablet Take 1 tablet by mouth every 6 (six) hours as needed for moderate pain.   2  . albuterol (PROAIR HFA) 108 (90 BASE) MCG/ACT inhaler Inhale 2 puffs into the lungs every 4 (  four) hours as needed for shortness of breath.     Marland Kitchen aspirin 81 MG tablet Take 81 mg by mouth daily.    . Cholecalciferol (VITAMIN D) 2000 UNITS CAPS Take 2,000 Units by mouth daily.    . clopidogrel (PLAVIX) 75 MG tablet Take 75 mg by mouth daily.     Marland Kitchen escitalopram (LEXAPRO) 20 MG tablet Take 20 mg by mouth daily.     . fluticasone furoate-vilanterol (BREO ELLIPTA) 200-25 MCG/INH AEPB Inhale 1 puff into the lungs daily.    . furosemide (LASIX) 40 MG tablet Take 1 tablet (40 mg total) by mouth daily. 30 tablet 3  . ipratropium-albuterol (DUONEB) 0.5-2.5 (3) MG/3ML SOLN Take 3 mLs by nebulization every 6 (six) hours as needed (shortness of breath).    . lactulose (CHRONULAC) 10 GM/15ML solution Take 10 g by mouth 2 (two) times daily as needed for mild  constipation.    . magnesium oxide (MAG-OX) 400 MG tablet Take 400 mg by mouth daily.    . metoprolol succinate (TOPROL-XL) 100 MG 24 hr tablet Take 1 tablet (100 mg total) by mouth daily. 90 tablet 3  . montelukast (SINGULAIR) 10 MG tablet Take 10 mg by mouth every morning.     . nitroGLYCERIN (NITROSTAT) 0.4 MG SL tablet Place 1 tablet (0.4 mg total) under the tongue every 5 (five) minutes x 3 doses as needed. If no relief after 3rd dose, proceed to ED 25 tablet 5  . pantoprazole (PROTONIX) 40 MG tablet TAKE 1 TABLET BY MOUTH DAILY 30 tablet 6  . potassium chloride SA (K-DUR,KLOR-CON) 20 MEQ tablet Take 1 tablet (20 mEq total) by mouth every other day. 45 tablet 3  . pravastatin (PRAVACHOL) 40 MG tablet Take 40 mg by mouth every other day.     Marland Kitchen rOPINIRole (REQUIP) 0.5 MG tablet Take 1 mg by mouth at bedtime.     Marland Kitchen SPIRIVA HANDIHALER 18 MCG inhalation capsule Place 1 capsule into inhaler and inhale daily.    Marland Kitchen spironolactone (ALDACTONE) 25 MG tablet TAKE 1/2 TABLET BY MOUTH DAILY 15 tablet 6   No current facility-administered medications for this visit.      Past Surgical History:  Procedure Laterality Date  . BI-VENTRICULAR IMPLANTABLE CARDIOVERTER DEFIBRILLATOR N/A 06/02/2013   Procedure: BI-VENTRICULAR IMPLANTABLE CARDIOVERTER DEFIBRILLATOR  (CRT-D);  Surgeon: Gardiner Rhyme, MD;  Location: [facility] CATH LAB;  Service: Cardiovascular;  Laterality: N/A;  . BI-VENTRICULAR IMPLANTABLE CARDIOVERTER DEFIBRILLATOR  (CRT-D)  06/02/2013   MDT VivaQuad CRTD implanted by Dr Johney Frame for ICM, CHF  . CARDIAC CATHETERIZATION  12/2012   S/P MI  . CORONARY ANGIOPLASTY WITH STENT PLACEMENT  2007; 2009; 2011   "?2 +1 +1 "  . CORONARY ANGIOPLASTY WITH STENT PLACEMENT  02/2012  . FRACTURE SURGERY    . HYSTEROSCOPY W/ ENDOMETRIAL ABLATION  ~ 1998  . LEAD REVISION  11-08-13   RA and LV lead revision by Dr Johney Frame  . LEAD REVISION Left 11/08/2013   Procedure: LEAD REVISION;  Surgeon: Gardiner Rhyme, MD;  Location:  [facility] CATH LAB;  Service: Cardiovascular;  Laterality: Left;  . LEFT HEART CATHETERIZATION WITH CORONARY ANGIOGRAM N/A 01/03/2013   Procedure: LEFT HEART CATHETERIZATION WITH CORONARY ANGIOGRAM;  Surgeon: Kathleene Hazel, MD;  Location: [facility] CATH LAB;  Service: Cardiovascular;  Laterality: N/A;  . ORIF ANKLE FRACTURE Right 10/21/2009  . TUBAL LIGATION  1989     Allergies  Allergen Reactions  . Tape Rash and Other (See Comments)  Severe Reaction (latex tape): Burned hand, Redness-took days to clear up (03/02/12)  . Codeine Nausea And Vomiting  . Prozac [Fluoxetine Hcl] Other (See Comments)    Mean/violent thoughts  . Fosamax [Alendronate Sodium] Nausea Only and Other (See Comments)    Stomach pain  . Keflex [Cephalexin] Nausea Only  . Lipitor [Atorvastatin] Other (See Comments)    Leg pain      Family History  Problem Relation Age of Onset  . Breast cancer Mother   . Heart failure Brother   . Breast cancer Sister   . Breast cancer Sister   . Uterine cancer Sister      Social History Ms. Centola reports that she has been smoking Cigarettes.  She started smoking about 24 years ago. She has a 15.00 pack-year smoking history. She has never used smokeless tobacco. Ms. Uhrich reports that she drinks alcohol.   Review of Systems CONSTITUTIONAL: No weight loss, fever, chills, weakness or fatigue.  HEENT: Eyes: No visual loss, blurred vision, double vision or yellow sclerae.No hearing loss, sneezing, congestion, runny nose or sore throat.  SKIN: No rash or itching.  CARDIOVASCULAR: per hpi RESPIRATORY: No shortness of breath, cough or sputum.  GASTROINTESTINAL: No anorexia, nausea, vomiting or diarrhea. No abdominal pain or blood.  GENITOURINARY: No burning on urination, no polyuria NEUROLOGICAL: No headache, dizziness, syncope, paralysis, ataxia, numbness or tingling in the extremities. No change in bowel or bladder control.  MUSCULOSKELETAL: No muscle, back pain, joint pain  or stiffness.  LYMPHATICS: No enlarged nodes. No history of splenectomy.  PSYCHIATRIC: No history of depression or anxiety.  ENDOCRINOLOGIC: No reports of sweating, cold or heat intolerance. No polyuria or polydipsia.  Marland Kitchen   Physical Examination Vitals:   02/08/16 0834  BP: 125/70  Pulse: 66   Vitals:   02/08/16 0834  Weight: 193 lb (87.5 kg)  Height: 5' (1.524 m)    Gen: resting comfortably, no acute distress HEENT: no scleral icterus, pupils equal round and reactive, no palptable cervical adenopathy,  CV: RRR, no m/r/g, no jvd Resp: Clear to auscultation bilaterally GI: abdomen is soft, non-tender, non-distended, normal bowel sounds, no hepatosplenomegaly MSK: extremities are warm, no edema.  Skin: warm, no rash Neuro:  no focal deficits Psych: appropriate affect   Diagnostic Studies 04/2010 Cath [place], [place]: LVEF 55%, mild anterior hypokinesis. LM normal, LAD with stents in prox and mid portion widely patent, LAD ostial 30%. Diags with luminal irregs. LCX with luminial irregs. RCA with distal patent stents  Stent cards May 2008 DES to PDA, DES to LAD  Jan 2009 DES to prox LAD  Mar 05 2009 DES to LAD x2  05/2011 DES to RCA  Jan 2014 stent done [place], [place]  03/2010 Echo: LVEF 40%, hypokinesis of the anteroseptum.   01/02/13 Cath Hemodynamic Findings: Central aortic pressure: 147/79  Left ventricular pressure: 130/22/26  Angiographic Findings: Left main: 30% distal stenosis. It appears that the distal left main has a stent that continues into the LAD.  Left Anterior Descending Artery: Large vessel that courses to the apex. The entire proximal and mid LAD is stented. There is mild stent restenosis in the proximal segment. The mid stented segment has diffuse 30% stent restenosis. The distal vessel becomes small in caliber and has mild diffuse plaque. There is a moderate caliber first diagonal Christia Coaxum with mild plaque disease.  Circumflex Artery:  Moderate caliber non-dominant vessel with three moderate caliber obtuse marginal branches. No obstructive disease.  Right Coronary Artery: Large dominant vessel  with 30% proximal stenosis, heavily calcified mid vessel with patent stent in the mid vessel (no significant restenosis). The distal vessel has diffuse 40-50% stenosis. The posterolateral Annielee Jemmott and PDA are moderate caliber, patent vessels with mild plaque disease.  Left Ventricular Angiogram: LVEF=25-30%. Hypokinesis of the antero-apical wall.  Impression:  1. 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 Elevated troponin following syncopal event. No culprit lesions seen.  3. Moderate to severe LV systolic dysfunction.  Recommendations: Continue medical management of CAD. Workup for syncope is underway. Carotid dopplers today. Will check d-dimer as PE is a possibility. She could also have had an arrythmia with LV dysfunction.  Complications: None. The patient tolerated the procedure well.  01/03/13 Echo - LVEF 25-30%, akinesis of the anteroseptal and apical myocardium, grade I diastolic dysfunction,  01/03/13 Carotid US - The vertebral arteries appear patent with antegrade flow. - Findings consistent with1- 39 percent stenosis,high end of scale,involving the right internal carotid artery. - Findings consistent with 40 - 59 percent stenosis, low end of scale,involving the left internal carotid artery. - ICA/CCA ratio. right =1.72. left = 1.52 Other specific details can be found in the table(s) above. Prepared and Electronically Authenticated by 12/13/12 Event monitor No symptoms reported, sinus rhythm with conduction delay, occasional PVCs.  04/2013 Echo Study Conclusions  - Left ventricle: The cavity size was normal. Wall thickness was increased in a pattern of mild LVH with moderate basal septal hypertrophy. Systolic function was moderately to severely reduced. The estimated ejection fraction  was in the range of 30% to 35%. There is akinesis to dyskinesis of the mid-distal anteroseptal and apical myocardium. There is akinesis of the distalinferoseptal myocardium. Doppler parameters are consistent with abnormal left ventricular relaxation (grade 1 diastolic dysfunction). - Ventricular septum: Septal motion showed abnormal function and dyssynergy. - Aortic valve: Mildly calcified annulus. Probably trileaflet; mildly calcified leaflets. No significant regurgitation. - Mitral valve: Calcified annulus. Trivial regurgitation. - Left atrium: The atrium was mildly dilated. - Right atrium: Central venous pressure: 8mm Hg (est). - Tricuspid valve: Trivial regurgitation. - Pulmonary arteries: PA peak pressure: 20mm Hg (S). - Pericardium, extracardiac: There was no pericardial effusion. Impressions:  - MildLVH with moderate basal septal hypertrophy, LVEF 30-35% with wall motion abnormalities as outlined, grade 1 diastolic dysfunction. Mild left atrial enlagement. MAC with trivial mitral regurgitation. MIldlysclerotic aortic valve. Trivial tricuspid regurgitation with normal PASP 20 mmHg. 05/2014 echo Study Conclusions  - Left ventricle: Technically limited study. The cavity size was normal. Wall thickness was increased in a pattern of moderate LVH. The estimated ejection fraction was 55%. Doppler parameters are consistent with abnormal left ventricular relaxation (grade 1 diastolic dysfunction). - Aortic valve: Sclerosis without stenosis. - Right ventricle: The cavity size was normal. Pacer wire or catheter noted in right ventricle. Systolic function was normal. - Impressions: Since the study in 2015, there is definite improvement in LV function.  Impressions:  - Since the study in 2015, there is definite improvement in LV function.   09/2015 Nuclear Stress  There was no ST segment deviation noted during stress.  No T wave inversion was noted during  stress.  Defect 1: There is a medium defect of mild severity present in the basal inferior and mid inferior location. This defect is likely a result of bowel loop attenuation artifact. No significant ischemia identified.  This is a low risk study. No significant ischemia identified.  Nuclear stress EF: 54%. Mid to apical septal wall hypokinesis noted  09/2015 echo Study Conclusions  - Left ventricle: The cavity size was normal. Systolic function was normal. The estimated ejection fraction was in the range of 55% to 60%. Wall motion was normal; there were no regional wall motion abnormalities. Doppler parameters are consistent with abnormal left ventricular relaxation (grade 1 diastolic dysfunction). Doppler parameters are consistent with indeterminate ventricular filling pressure. - Aortic valve: Transvalvular velocity was within the normal range. There was no stenosis. There was no regurgitation. - Mitral valve: Transvalvular velocity was within the normal range. There was no evidence for stenosis. There was no regurgitation. - Left atrium: The atrium was moderately dilated. - Right ventricle: The cavity size was normal. Wall thickness was normal. Systolic function was normal. - Tricuspid valve: There was no regurgitation.    Assessment and Plan  1. ICM/Chronic systolic heart failure  - previous LVEF 30-35%. After BiV AICD repeat echo with normalized LVEF - medical therapy has been limited due to orthstatic symptoms - indefinite plavix due to large stent burden  - some evidence of fluid overload. She will increase her daily lasix to 60mg  daily. Repeat BMET/Mg in 2 weeks  2. OSA - continue CPAP  3. Hyperlipidemia - difficulty tolerating statins in the past. Tolerating pravastatin every other day, we will try changing to daily.   4. HTN -at goal, she will continue current meds    F/u 4 months  Antoine PocheJonathan F. Floyed Masoud, M.D.

## 2016-02-08 NOTE — Patient Instructions (Signed)
Your physician wants you to follow-up in: 4 MONTHS WITH DR. BRANCH You will receive a reminder letter in the mail two months in advance. If you don't receive a letter, please call our office to schedule the follow-up appointment.  Your physician has recommended you make the following change in your medication:   INCREASE PRAVASTATIN 40 MG DAILY  INCREASE LASIX 60 MG DAILY MAY TAKE EXTRA 60 MG AS NEEDED  Your physician recommends that you return for lab work in: 2 WEEKS BMP/MG   Thank you for choosing [facility]!!

## 2016-02-14 ENCOUNTER — Encounter: Payer: Self-pay | Admitting: Cardiology

## 2016-02-14 ENCOUNTER — Ambulatory Visit (INDEPENDENT_AMBULATORY_CARE_PROVIDER_SITE_OTHER): Payer: Medicare Other

## 2016-02-14 DIAGNOSIS — I5022 Chronic systolic (congestive) heart failure: Secondary | ICD-10-CM

## 2016-02-14 DIAGNOSIS — Z95810 Presence of automatic (implantable) cardiac defibrillator: Secondary | ICD-10-CM

## 2016-02-14 NOTE — Progress Notes (Signed)
EPIC Encounter for ICM Monitoring  Patient Name: Becky [name] is a 65 y.o. female Date: 02/14/2016 Primary Care Physican: Joseph Art, MD Primary Cardiologist:Branch Electrophysiologist: Allred Dry Weight:unknown Bi-V Pacing: 98.2%        Heart Failure questions reviewed, pt asymptomatic   Thoracic impedance returned to normal after Furosemide increased to 60 mg daily by Dr Harl Bowie on 02/08/2016.   Labs: BMP & Mg ordered for 2 weeks after OV with Dr Harl Bowie on 02/08/2016 01/18/2016 Creatinine 1.11, BUN 19, Potassium 4.5, Sodium 134, EGFR 52->60 01/17/2016 Creatinine 0.90, BUN 13, Potassium 5.1, Sodium 136, EGFR 68-78  01/16/2016 Creatinine 1.02, BUN 15, Potassium 3.4, Sodium 137, EGFR 58-67  11//28/2017 Creatinine 1.09, BUN 10, Potassium 3.0, Sodium 138, EGFR 54-62, BNP 88  08/17/2017Creatinine 1.12, BUN 27, Potassium 3.6, Sodium 134, EGFR 50-58 10/03/2015 Creatinine 1.02, BUN 20, Potassium 3.4, Sodium 136, EGFR 56->60  05/26/2015 Creatinine 1.01, BUN 12, Potassium 4.2, Sodium 137  Recommendations: No changes.  Reinforced to limit low salt food choices to 2000 mg day and limiting fluid intake to < 2 liters per day. Encouraged to call for fluid symptoms.    Follow-up plan: ICM clinic phone appointment on 03/24/2016 and office appointment with Dr Rayann Heman on 02/22/2016.  Copy of ICM check sent to primary cardiologist and device physician.   3 month ICM trend : 02/14/2016   1 Year ICM trend:      Rosalene Billings, RN 02/14/2016 10:34 AM

## 2016-02-22 ENCOUNTER — Ambulatory Visit (INDEPENDENT_AMBULATORY_CARE_PROVIDER_SITE_OTHER): Payer: Medicare Other | Admitting: Internal Medicine

## 2016-02-22 ENCOUNTER — Encounter: Payer: Self-pay | Admitting: Internal Medicine

## 2016-02-22 VITALS — BP 112/70 | HR 68 | Ht 60.0 in | Wt 192.0 lb

## 2016-02-22 DIAGNOSIS — Z720 Tobacco use: Secondary | ICD-10-CM

## 2016-02-22 DIAGNOSIS — I2510 Atherosclerotic heart disease of native coronary artery without angina pectoris: Secondary | ICD-10-CM

## 2016-02-22 DIAGNOSIS — I255 Ischemic cardiomyopathy: Secondary | ICD-10-CM

## 2016-02-22 DIAGNOSIS — I5022 Chronic systolic (congestive) heart failure: Secondary | ICD-10-CM | POA: Diagnosis not present

## 2016-02-22 DIAGNOSIS — I5042 Chronic combined systolic (congestive) and diastolic (congestive) heart failure: Secondary | ICD-10-CM | POA: Diagnosis not present

## 2016-02-22 DIAGNOSIS — Z95810 Presence of automatic (implantable) cardiac defibrillator: Secondary | ICD-10-CM | POA: Diagnosis not present

## 2016-02-22 LAB — CUP PACEART INCLINIC DEVICE CHECK
Battery Remaining Longevity: 40 mo
Battery Voltage: 2.96 V
Brady Statistic AS VP Percent: 68 %
Brady Statistic RA Percent Paced: 30.67 %
Date Time Interrogation Session: 20180105100143
HIGH POWER IMPEDANCE MEASURED VALUE: 73 Ohm
Implantable Lead Implant Date: 20150416
Implantable Lead Implant Date: 20150922
Implantable Lead Implant Date: 20150922
Implantable Lead Location: 753858
Implantable Lead Location: 753859
Implantable Lead Model: 4598
Implantable Pulse Generator Implant Date: 20150416
Lead Channel Impedance Value: 304 Ohm
Lead Channel Impedance Value: 342 Ohm
Lead Channel Impedance Value: 437 Ohm
Lead Channel Impedance Value: 494 Ohm
Lead Channel Impedance Value: 513 Ohm
Lead Channel Impedance Value: 589 Ohm
Lead Channel Impedance Value: 589 Ohm
Lead Channel Impedance Value: 627 Ohm
Lead Channel Pacing Threshold Amplitude: 0.75 V
Lead Channel Pacing Threshold Pulse Width: 0.4 ms
Lead Channel Pacing Threshold Pulse Width: 0.4 ms
Lead Channel Pacing Threshold Pulse Width: 0.8 ms
Lead Channel Sensing Intrinsic Amplitude: 3.125 mV
Lead Channel Sensing Intrinsic Amplitude: 7.375 mV
Lead Channel Setting Pacing Amplitude: 2 V
Lead Channel Setting Pacing Amplitude: 2 V
Lead Channel Setting Pacing Pulse Width: 0.8 ms
MDC IDC LEAD LOCATION: 753860
MDC IDC LEAD MODEL: 6935
MDC IDC MSMT LEADCHNL LV IMPEDANCE VALUE: 304 Ohm
MDC IDC MSMT LEADCHNL LV IMPEDANCE VALUE: 304 Ohm
MDC IDC MSMT LEADCHNL LV IMPEDANCE VALUE: 323 Ohm
MDC IDC MSMT LEADCHNL LV IMPEDANCE VALUE: 456 Ohm
MDC IDC MSMT LEADCHNL LV PACING THRESHOLD AMPLITUDE: 2.25 V
MDC IDC MSMT LEADCHNL RA SENSING INTR AMPL: 3.5 mV
MDC IDC MSMT LEADCHNL RV IMPEDANCE VALUE: 399 Ohm
MDC IDC MSMT LEADCHNL RV PACING THRESHOLD AMPLITUDE: 0.75 V
MDC IDC MSMT LEADCHNL RV SENSING INTR AMPL: 6 mV
MDC IDC SET LEADCHNL LV PACING AMPLITUDE: 2.75 V
MDC IDC SET LEADCHNL RV PACING PULSEWIDTH: 0.4 ms
MDC IDC SET LEADCHNL RV SENSING SENSITIVITY: 0.3 mV
MDC IDC STAT BRADY AP VP PERCENT: 30.38 %
MDC IDC STAT BRADY AP VS PERCENT: 0.53 %
MDC IDC STAT BRADY AS VS PERCENT: 1.09 %
MDC IDC STAT BRADY RV PERCENT PACED: 12.24 %

## 2016-02-22 NOTE — Progress Notes (Signed)
PCP: Bedelia Person, MD Primary Cardiologist:  Dr Crisoforo Oxford Becky [name] is a 66 y.o. female who presents today for electrophysiology followup. Since her last visit, she has done very well.  She is pleased with her current health state.  She continues to smoke.  She has had several recent admissions for SOB likely due to COPD.  she did have an elevation in optivol in November which correlated with a hospitalization.  Today she denies CP, dizziness, presyncope, syncope or other issues.   Past Medical History:  Diagnosis Date  . AICD (automatic cardioverter/defibrillator) present   . Anxiety   . Arthritis    "all my joints; neck, back, legs, arms, elbows"  (10/03/2015)  . Asthma   . CAD (coronary artery disease)    a. 5/08: s/p DES to PDA and DES to LAD; b. 1/09: s/p DES to pLAD; c. 1/11: s/p DES to LAD x 2, d. LHC (3/12 in [place] - EF 55%, mild ant HK, nl LM, LAD stents ok, oLAD 30, RCA stents ok;  e.  4/13:  s/p DES to RCA, f. 1/14: dLM 20-30, mOM3 20-30, mLAD 80 ISR => s/p 2.5x16 mm Promus Element DES; EF 45-50%   . CHF (congestive heart failure) (HCC)   . Chronic bronchitis (HCC)   . Chronic lower back pain    "into pelvis region" (10/03/2015)  . COPD (chronic obstructive pulmonary disease) (HCC)   . Daily headache    "might be from sinus" (10/03/2015)  . Depression    hx (10/03/2015)  . Fibromyalgia   . GERD (gastroesophageal reflux disease)   . Heart murmur   . HLD (hyperlipidemia)   . Hypertension   . Ischemic cardiomyopathy    a. Echo (11/14):  EF 25%   . LBBB (left bundle branch block)   . Myocardial infarction 12/2012  . OSA on CPAP   . Pneumonia 1950s  . Syncope 11/14   LifeVest placed  . Upper back pain, chronic    "into my shoulders; fibromyalgia works here" (10/03/2015)   Past Surgical History:  Procedure Laterality Date  . BI-VENTRICULAR IMPLANTABLE CARDIOVERTER DEFIBRILLATOR N/A 06/02/2013   Procedure: BI-VENTRICULAR IMPLANTABLE CARDIOVERTER DEFIBRILLATOR   (CRT-D);  Surgeon: Gardiner Rhyme, MD;  Location: [facility] CATH LAB;  Service: Cardiovascular;  Laterality: N/A;  . BI-VENTRICULAR IMPLANTABLE CARDIOVERTER DEFIBRILLATOR  (CRT-D)  06/02/2013   MDT VivaQuad CRTD implanted by Dr Johney Frame for ICM, CHF  . CARDIAC CATHETERIZATION  12/2012   S/P MI  . CORONARY ANGIOPLASTY WITH STENT PLACEMENT  2007; 2009; 2011   "?2 +1 +1 "  . CORONARY ANGIOPLASTY WITH STENT PLACEMENT  02/2012  . FRACTURE SURGERY    . HYSTEROSCOPY W/ ENDOMETRIAL ABLATION  ~ 1998  . LEAD REVISION  11-08-13   RA and LV lead revision by Dr Johney Frame  . LEAD REVISION Left 11/08/2013   Procedure: LEAD REVISION;  Surgeon: Gardiner Rhyme, MD;  Location[facility] CATH LAB;  Service: Cardiovascular;  Laterality: Left;  . LEFT HEART CATHETERIZATION WITH CORONARY ANGIOGRAM N/A 01/03/2013   Procedure: LEFT HEART CATHETERIZATION WITH CORONARY ANGIOGRAM;  Surgeon: Kathleene Hazel, MD;  Location: [facility] CATH LAB;  Service: Cardiovascular;  Laterality: N/A;  . ORIF ANKLE FRACTURE Right 10/21/2009  . TUBAL LIGATION  1989    Current Outpatient Prescriptions  Medication Sig Dispense Refill  . acetaminophen-codeine (TYLENOL #3) 300-30 MG tablet Take 1 tablet by mouth every 6 (six) hours as needed for moderate pain.   2  . Aspirin-Salicylamide-Caffeine (  BC HEADACHE PO) Take 1 each by mouth daily.    . benzonatate (TESSALON) 100 MG capsule Take 2 capsules by mouth 3 (three) times daily as needed.  0  . calcium carbonate (CALCIUM 600) 1500 (600 Ca) MG TABS tablet Take 600 mg of elemental calcium by mouth daily with breakfast.    . Cholecalciferol (VITAMIN D) 2000 UNITS CAPS Take 2,000 Units by mouth daily.    . clopidogrel (PLAVIX) 75 MG tablet Take 75 mg by mouth daily.     Marland Kitchen Dextromethorphan-Guaifenesin (MUCINEX DM) 30-600 MG TB12 Take 1 tablet by mouth 2 (two) times daily as needed.  0  . escitalopram (LEXAPRO) 20 MG tablet Take 20 mg by mouth daily.     . fluticasone (FLONASE) 50 MCG/ACT nasal spray Place 2  sprays into both nostrils daily.  7  . furosemide (LASIX) 40 MG tablet TAKE 60 MG DAILY - MAY TAKE EXTRA 60 MG AS NEEDED 90 tablet 3  . ipratropium-albuterol (DUONEB) 0.5-2.5 (3) MG/3ML SOLN Take 3 mLs by nebulization every 6 (six) hours as needed (shortness of breath).    . lactulose (CHRONULAC) 10 GM/15ML solution Take 10 g by mouth 2 (two) times daily as needed for mild constipation.    . magnesium oxide (MAG-OX) 400 MG tablet Take 400 mg by mouth daily.    . meclizine (ANTIVERT) 25 MG tablet Take 1 tablet by mouth 2 (two) times daily as needed.  3  . metoprolol succinate (TOPROL-XL) 100 MG 24 hr tablet Take 1 tablet (100 mg total) by mouth daily. 90 tablet 3  . montelukast (SINGULAIR) 10 MG tablet Take 10 mg by mouth every morning.     . nitroGLYCERIN (NITROSTAT) 0.4 MG SL tablet Place 1 tablet (0.4 mg total) under the tongue every 5 (five) minutes x 3 doses as needed. If no relief after 3rd dose, proceed to ED 25 tablet 5  . pantoprazole (PROTONIX) 40 MG tablet TAKE 1 TABLET BY MOUTH DAILY 30 tablet 6  . potassium chloride SA (K-DUR,KLOR-CON) 20 MEQ tablet Take 1 tablet (20 mEq total) by mouth every other day. 45 tablet 3  . pravastatin (PRAVACHOL) 40 MG tablet Take 1 tablet (40 mg total) by mouth every evening. 90 tablet 3  . predniSONE (DELTASONE) 10 MG tablet Take 10 mg by mouth as directed.    Marland Kitchen rOPINIRole (REQUIP) 0.5 MG tablet Take 0.5 mg by mouth at bedtime.     Marland Kitchen spironolactone (ALDACTONE) 25 MG tablet TAKE 1/2 TABLET BY MOUTH DAILY 15 tablet 6  . topiramate (TOPAMAX) 25 MG tablet Take 50 mg by mouth at bedtime.     No current facility-administered medications for this visit.     Physical Exam: Vitals:   02/22/16 0834  BP: 112/70  Pulse: 68  SpO2: 97%  Weight: 192 lb (87.1 kg)  Height: 5' (1.524 m)    GEN- The patient is overweight appearing, alert and oriented x 3 today.   Head- normocephalic, atraumatic Eyes-  Sclera clear, conjunctiva pink Ears- hearing  intact Oropharynx- clear Lungs- Clear to ausculation bilaterally, normal work of breathing Chest- ICD pocket is well healed Heart- Regular rate and rhythm, no murmurs, rubs or gallops, PMI not laterally displaced GI- soft, NT, ND, + BS Extremities- no clubbing, cyanosis, or edema  ICD interrogation- reviewed in detail today,  See PACEART report  Assessment and Plan:  1.  Chronic systolic dysfunction/ Ischemic CM euvolemic today Stable on an appropriate medical regimen Normal ICD function See Pace Art report  No changes today Followed in ICM device clinic 2 gram sodium diet  2. Obesity Body mass index is 37.5 kg/m. 203-->192 lbs in past year.  I have commended her. Lifestyle modification and regular exercise were strongly encouraged today  3. Tobacco Use Strongly encouraged cessation today She declines chantix  4. CAD No ischemic symptoms No changes today  carelink Return to see me in 1 year Follow-up with Dr Wyline Mood as scheduled   Hillis Range MD, [facility] 02/22/2016 9:06 AM

## 2016-02-22 NOTE — Patient Instructions (Signed)
Medication Instructions:  Continue all current medications.  Labwork: none  Testing/Procedures: none  Follow-Up: Your physician wants you to follow up in:  1 year.  You will receive a reminder letter in the mail one-two months in advance.  If you don't receive a letter, please call our office to schedule the follow up appointment   Any Other Special Instructions Will Be Listed Below (If Applicable).  Remote monitoring is used to monitor your Pacemaker of ICD from home. This monitoring reduces the number of office visits required to check your device to one time per year. It allows [place] to keep an eye on the functioning of your device to ensure it is working properly. You are scheduled for a device check from home on 05/26/2016. You may send your transmission at any time that day. If you have a wireless device, the transmission will be sent automatically. After your physician reviews your transmission, you will receive a postcard with your next transmission date.  2 gm low sodium diet  Smoking cessation.  If you need a refill on your cardiac medications before your next appointment, please call your pharmacy.

## 2016-03-04 ENCOUNTER — Ambulatory Visit (INDEPENDENT_AMBULATORY_CARE_PROVIDER_SITE_OTHER): Payer: Medicare Other

## 2016-03-04 DIAGNOSIS — Z95810 Presence of automatic (implantable) cardiac defibrillator: Secondary | ICD-10-CM

## 2016-03-04 DIAGNOSIS — I5042 Chronic combined systolic (congestive) and diastolic (congestive) heart failure: Secondary | ICD-10-CM

## 2016-03-04 NOTE — Progress Notes (Signed)
EPIC Encounter for ICM Monitoring  Patient Name: Becky [name] is a 66 y.o. female Date: 03/04/2016 Primary Care Physican: Joseph Art, MD Primary Cardiologist:Branch Electrophysiologist: Allred Dry Weight:183 lbs Bi-V Pacing: 98.2%      Received call from patient.  She reported her breathing has worsened and and occasion swelling of the feet.  She sent remote transmission today for review.  Denied any weight gain  Thoracic impedance at normal today but has been abnormal suggesting fluid accumulation from 02/16/16 until today.  Labs: BMP & Mg ordered for 2 weeks after OV with Dr Harl Bowie on 02/08/2016 01/18/2016 Creatinine 1.11, BUN 19, Potassium 4.5, Sodium 134, EGFR 52->60 01/17/2016 Creatinine 0.90, BUN 13, Potassium 5.1, Sodium 136, EGFR 68-78  01/16/2016 Creatinine 1.02, BUN 15, Potassium 3.4, Sodium 137, EGFR 58-67  11//28/2017 Creatinine 1.09, BUN 10, Potassium 3.0, Sodium 138, EGFR 54-62, BNP 88  08/17/2017Creatinine 1.12, BUN 27, Potassium 3.6, Sodium 134, EGFR 50-58 10/03/2015 Creatinine 1.02, BUN 20, Potassium 3.4, Sodium 136, EGFR 56->60  05/26/2015 Creatinine 1.01, BUN 12, Potassium 4.2, Sodium 137  Recommendations: She reported taking an extra Furosemide today for symptoms.  She is also having some allergies problem and thinks that could be affecting her breathing some too.    Follow-up plan: ICM clinic phone appointment on 03/24/2016.  Copy of ICM check sent to primary cardiologist and device physician.   3 month ICM trend: 03/04/2016   1 Year ICM trend:      Rosalene Billings, RN 03/04/2016 9:38 AM

## 2016-03-13 ENCOUNTER — Telehealth: Payer: Self-pay | Admitting: *Deleted

## 2016-03-13 NOTE — Telephone Encounter (Signed)
BMP/MG labs results being scanned in to chart - results were faxed to Dr. Clifton Custard (pcp) critical potassium - Dr. Clifton Custard gave new order for [facility] repeat on Friday 1/26 - requested results from repeat labs be faxed to Dr. Wyline Mood as well [facility] medical will fax results when available tomorrow

## 2016-03-14 ENCOUNTER — Telehealth: Payer: Self-pay | Admitting: *Deleted

## 2016-03-14 NOTE — Telephone Encounter (Signed)
Addressed by pcp repeat lab to be done today and pcp will fax when results received

## 2016-03-14 NOTE — Telephone Encounter (Signed)
-----   Message from Antoine Poche, MD sent at 03/13/2016  3:09 PM EST ----- Her potassium is very low, please check with her as I believe her pcp has already addressed this  Dominga Ferry MD

## 2016-03-24 ENCOUNTER — Ambulatory Visit (INDEPENDENT_AMBULATORY_CARE_PROVIDER_SITE_OTHER): Payer: Medicare Other

## 2016-03-24 ENCOUNTER — Telehealth: Payer: Self-pay

## 2016-03-24 DIAGNOSIS — I5042 Chronic combined systolic (congestive) and diastolic (congestive) heart failure: Secondary | ICD-10-CM

## 2016-03-24 DIAGNOSIS — Z95810 Presence of automatic (implantable) cardiac defibrillator: Secondary | ICD-10-CM

## 2016-03-24 NOTE — Progress Notes (Signed)
EPIC Encounter for ICM Monitoring  Patient Name: Becky [name] is a 66 y.o. female Date: 03/24/2016 Primary Care Physican: Joseph Art, MD Primary Cardiologist:Branch Electrophysiologist: Allred Dry Weight:unknown Bi-V Pacing: 98.1%      Attempted call to patient and unable to reach.  Left detailed message regarding transmission.  Transmission reviewed.   Thoracic impedance normal   Labs:  03/11/2016 Creatinine 0.98, BUN 17, Potassium 2.9, Sodium 136 01/18/2016 Creatinine 1.11, BUN 19, Potassium 4.5, Sodium 134, EGFR 52->60 01/17/2016 Creatinine 0.90, BUN 13, Potassium 5.1, Sodium 136, EGFR 68-78  01/16/2016 Creatinine 1.02, BUN 15, Potassium 3.4, Sodium 137, EGFR 58-67  11//28/2017 Creatinine 1.09, BUN 10, Potassium 3.0, Sodium 138, EGFR 54-62, BNP 88  08/17/2017Creatinine 1.12, BUN 27, Potassium 3.6, Sodium 134, EGFR 50-58 10/03/2015 Creatinine 1.02, BUN 20, Potassium 3.4, Sodium 136, EGFR 56->60  05/26/2015 Creatinine 1.01, BUN 12, Potassium 4.2, Sodium 137  Recommendations: NONE - Unable to reach patient   Follow-up plan: ICM clinic phone appointment on 04/24/2016.  Copy of ICM check sent to primary cardiologist and device physician.   3 month ICM trend: 03/24/2016   1 Year ICM trend:      Rosalene Billings, RN 03/24/2016 2:53 PM

## 2016-03-24 NOTE — Telephone Encounter (Signed)
Remote ICM transmission received.  Attempted patient call and left detailed message regarding transmission and next ICM scheduled for 04/24/2016.  Advised to return call for any fluid symptoms or questions.

## 2016-04-24 ENCOUNTER — Ambulatory Visit (INDEPENDENT_AMBULATORY_CARE_PROVIDER_SITE_OTHER): Payer: Medicare Other

## 2016-04-24 DIAGNOSIS — Z95810 Presence of automatic (implantable) cardiac defibrillator: Secondary | ICD-10-CM

## 2016-04-24 DIAGNOSIS — I5042 Chronic combined systolic (congestive) and diastolic (congestive) heart failure: Secondary | ICD-10-CM

## 2016-04-24 NOTE — Progress Notes (Signed)
EPIC Encounter for ICM Monitoring  Patient Name: Becky [name] is a 66 y.o. female Date: 04/24/2016 Primary Care Physican: Joseph Art, MD Primary Cardiologist:Branch Electrophysiologist: Allred Dry Weight:183 lbs Bi-V Pacing: 98%       Heart Failure questions reviewed, pt has shortness of breath but has been related to asthma.  Home health is checking lungs weekly.    Thoracic impedance normal today but was abnormal suggesting fluid accumulation since 02/205/2018 with exception of 2 days at baseline.  She takes extra Furosemide usually on Sundays and occasionally more often if needed  Prescribed and confirmed dosage: Furosemide 40 mg 1.5 tablets (60 mg total) daily and may take an extra 60 mg as needed.  Potassium 20 mEq 1 tablet every other day.   Labs:  03/11/2016 Creatinine 0.98, BUN 17, Potassium 2.9, Sodium 136 01/18/2016 Creatinine 1.11, BUN 19, Potassium 4.5, Sodium 134, EGFR 52->60 01/17/2016 Creatinine 0.90, BUN 13, Potassium 5.1, Sodium 136, EGFR 68-78  01/16/2016 Creatinine 1.02, BUN 15, Potassium 3.4, Sodium 137, EGFR 58-67  11//28/2017 Creatinine 1.09, BUN 10, Potassium 3.0, Sodium 138, EGFR 54-62, BNP 88  08/17/2017Creatinine 1.12, BUN 27, Potassium 3.6, Sodium 134, EGFR 50-58 10/03/2015 Creatinine 1.02, BUN 20, Potassium 3.4, Sodium 136, EGFR 56->60  05/26/2015 Creatinine 1.01, BUN 12, Potassium 4.2, Sodium 137  Recommendations: No changes. Reminded to limit dietary salt intake to 2000 mg/day and fluid intake to < 2 liters/day. Encouraged to call for fluid symptoms.  Follow-up plan: ICM clinic phone appointment on 05/26/2016.  Copy of ICM check sent to primary cardiologist and device physician.   3 month ICM trend: 04/24/2016   1 Year ICM trend:      Rosalene Billings, RN 04/24/2016 12:32 PM

## 2016-05-06 ENCOUNTER — Telehealth: Payer: Self-pay

## 2016-05-06 NOTE — Telephone Encounter (Signed)
Received patient call. She stated she has entered [facility] in [place].  She expects her stay to be 90-100 day and eventually she may permanently live there.  She feels this move will be good for her and she is happy about. She will let us know when to update her demographics.  She will be setting up home monitor this week.

## 2016-05-15 ENCOUNTER — Telehealth: Payer: Self-pay

## 2016-05-15 NOTE — Telephone Encounter (Signed)
Received voice mail message asking if I received transmission and return call to patient.  Left voice mail message that I received the transmission.  She was checking if the monitor was working since she moved to rehab center.

## 2016-05-26 ENCOUNTER — Ambulatory Visit (INDEPENDENT_AMBULATORY_CARE_PROVIDER_SITE_OTHER): Payer: Medicare Other | Admitting: *Deleted

## 2016-05-26 ENCOUNTER — Telehealth: Payer: Self-pay

## 2016-05-26 DIAGNOSIS — Z95810 Presence of automatic (implantable) cardiac defibrillator: Secondary | ICD-10-CM

## 2016-05-26 DIAGNOSIS — I5042 Chronic combined systolic (congestive) and diastolic (congestive) heart failure: Secondary | ICD-10-CM

## 2016-05-26 DIAGNOSIS — I255 Ischemic cardiomyopathy: Secondary | ICD-10-CM

## 2016-05-26 NOTE — Progress Notes (Signed)
EPIC Encounter for ICM Monitoring  Patient Name: Becky [name] is a 66 y.o. female Date: 05/26/2016 Primary Care Physican: Joseph Art, MD Primary Cardiologist:Branch Electrophysiologist: Allred Dry Weight:Last known weight 183 lbs Bi-V Pacing: 98%      Attempted call to patient and unable to reach.  Left detailed message regarding transmission.  Transmission reviewed.    Thoracic impedance normal.  Impedance was above baseline suggesting dryness from 05/05/2016 to 05/16/2016  Prescribed and confirmed dosage: Furosemide 40 mg 1.5 tablets (60 mg total) daily and may take an extra 60 mg as needed.  Potassium 20 mEq 1 tablet every other day.   Labs:  03/11/2016 Creatinine 0.98, BUN 17, Potassium 2.9, Sodium 136 01/18/2016 Creatinine 1.11, BUN 19, Potassium 4.5, Sodium 134, EGFR 52->60 01/17/2016 Creatinine 0.90, BUN 13, Potassium 5.1, Sodium 136, EGFR 68-78  01/16/2016 Creatinine 1.02, BUN 15, Potassium 3.4, Sodium 137, EGFR 58-67  11//28/2017 Creatinine 1.09, BUN 10, Potassium 3.0, Sodium 138, EGFR 54-62, BNP 88  08/17/2017Creatinine 1.12, BUN 27, Potassium 3.6, Sodium 134, EGFR 50-58 10/03/2015 Creatinine 1.02, BUN 20, Potassium 3.4, Sodium 136, EGFR 56->60  05/26/2015 Creatinine 1.01, BUN 12, Potassium 4.2, Sodium 137  Recommendations: Left voice mail with ICM number and encouraged to call for fluid symptoms.  Follow-up plan: ICM clinic phone appointment on 06/26/2016.  Office appointment scheduled on 06/09/2016 with Dr Harl Bowie.  Copy of ICM check sent to primary cardiologist and device physician.   3 month ICM trend: 05/26/2016   1 Year ICM trend:      Rosalene Billings, RN 05/26/2016 9:32 AM

## 2016-05-26 NOTE — Progress Notes (Signed)
Remote ICD transmission.   

## 2016-05-26 NOTE — Telephone Encounter (Signed)
Remote ICM transmission received.  Attempted patient call and left detailed message regarding transmission and next ICM scheduled for 06/26/2016.  Advised to return call for any fluid symptoms or questions.

## 2016-05-27 LAB — CUP PACEART REMOTE DEVICE CHECK
Brady Statistic AP VP Percent: 35.43 %
Brady Statistic AP VS Percent: 0.62 %
Brady Statistic AS VP Percent: 62.96 %
Brady Statistic RA Percent Paced: 35.93 %
HighPow Impedance: 84 Ohm
Implantable Lead Implant Date: 20150416
Implantable Lead Implant Date: 20150922
Implantable Lead Location: 753858
Implantable Lead Location: 753859
Implantable Lead Location: 753860
Implantable Lead Model: 5076
Lead Channel Impedance Value: 304 Ohm
Lead Channel Impedance Value: 323 Ohm
Lead Channel Impedance Value: 323 Ohm
Lead Channel Impedance Value: 380 Ohm
Lead Channel Impedance Value: 665 Ohm
Lead Channel Impedance Value: 703 Ohm
Lead Channel Impedance Value: 703 Ohm
Lead Channel Pacing Threshold Amplitude: 0.625 V
Lead Channel Pacing Threshold Amplitude: 0.875 V
Lead Channel Pacing Threshold Pulse Width: 0.4 ms
Lead Channel Pacing Threshold Pulse Width: 0.4 ms
Lead Channel Pacing Threshold Pulse Width: 0.8 ms
Lead Channel Sensing Intrinsic Amplitude: 3.25 mV
Lead Channel Sensing Intrinsic Amplitude: 3.25 mV
Lead Channel Setting Pacing Amplitude: 2 V
Lead Channel Setting Pacing Amplitude: 2 V
Lead Channel Setting Pacing Pulse Width: 0.8 ms
Lead Channel Setting Sensing Sensitivity: 0.3 mV
MDC IDC LEAD IMPLANT DT: 20150922
MDC IDC MSMT BATTERY REMAINING LONGEVITY: 32 mo
MDC IDC MSMT BATTERY VOLTAGE: 2.95 V
MDC IDC MSMT LEADCHNL LV IMPEDANCE VALUE: 342 Ohm
MDC IDC MSMT LEADCHNL LV IMPEDANCE VALUE: 513 Ohm
MDC IDC MSMT LEADCHNL LV IMPEDANCE VALUE: 513 Ohm
MDC IDC MSMT LEADCHNL LV IMPEDANCE VALUE: 532 Ohm
MDC IDC MSMT LEADCHNL LV PACING THRESHOLD AMPLITUDE: 2.125 V
MDC IDC MSMT LEADCHNL RV IMPEDANCE VALUE: 266 Ohm
MDC IDC MSMT LEADCHNL RV IMPEDANCE VALUE: 342 Ohm
MDC IDC MSMT LEADCHNL RV SENSING INTR AMPL: 6.75 mV
MDC IDC MSMT LEADCHNL RV SENSING INTR AMPL: 6.75 mV
MDC IDC PG IMPLANT DT: 20150416
MDC IDC SESS DTM: 20180409041603
MDC IDC SET LEADCHNL LV PACING AMPLITUDE: 2.75 V
MDC IDC SET LEADCHNL RV PACING PULSEWIDTH: 0.4 ms
MDC IDC STAT BRADY AS VS PERCENT: 0.99 %
MDC IDC STAT BRADY RV PERCENT PACED: 0.09 %

## 2016-05-28 ENCOUNTER — Encounter: Payer: Self-pay | Admitting: Cardiology

## 2016-06-06 ENCOUNTER — Telehealth: Payer: Self-pay

## 2016-06-06 NOTE — Telephone Encounter (Signed)
Received call from patient and she was discharged from rehab.  She is feeling stronger and so much better.  She has quit smoking and very happy about it.  She will send ICM remote transmission so make sure her monitor is working.  She has not fluid symptoms at this time.

## 2016-06-09 ENCOUNTER — Encounter: Payer: Self-pay | Admitting: *Deleted

## 2016-06-09 ENCOUNTER — Ambulatory Visit (INDEPENDENT_AMBULATORY_CARE_PROVIDER_SITE_OTHER): Payer: Medicare Other | Admitting: Cardiology

## 2016-06-09 ENCOUNTER — Ambulatory Visit (INDEPENDENT_AMBULATORY_CARE_PROVIDER_SITE_OTHER): Payer: Self-pay

## 2016-06-09 ENCOUNTER — Encounter: Payer: Self-pay | Admitting: Cardiology

## 2016-06-09 VITALS — BP 131/59 | HR 60 | Ht 60.0 in | Wt 190.8 lb

## 2016-06-09 DIAGNOSIS — I2510 Atherosclerotic heart disease of native coronary artery without angina pectoris: Secondary | ICD-10-CM

## 2016-06-09 DIAGNOSIS — Z95810 Presence of automatic (implantable) cardiac defibrillator: Secondary | ICD-10-CM

## 2016-06-09 DIAGNOSIS — I5022 Chronic systolic (congestive) heart failure: Secondary | ICD-10-CM

## 2016-06-09 DIAGNOSIS — I255 Ischemic cardiomyopathy: Secondary | ICD-10-CM | POA: Diagnosis not present

## 2016-06-09 DIAGNOSIS — I5042 Chronic combined systolic (congestive) and diastolic (congestive) heart failure: Secondary | ICD-10-CM

## 2016-06-09 DIAGNOSIS — E782 Mixed hyperlipidemia: Secondary | ICD-10-CM | POA: Diagnosis not present

## 2016-06-09 DIAGNOSIS — I10 Essential (primary) hypertension: Secondary | ICD-10-CM | POA: Diagnosis not present

## 2016-06-09 NOTE — Progress Notes (Signed)
EPIC Encounter for ICM Monitoring  Patient Name: Becky [name] is a 66 y.o. female Date: 06/09/2016 Primary Care Physican: Joseph Art, MD Primary Cardiologist:Branch Electrophysiologist: Allred Dry Weight:Last known weight 183 lbs Bi-V Pacing: 96.8%       Spoke with patient on 06/06/2016 and she reported she was discharged from rehab and feeling stronger.  She denied any fluid symptoms.  She has appointment with Dr Harl Bowie today.    Thoracic impedance abnormal suggesting fluid accumulation starting 06/02/2016.  Prescribed dosage: Furosemide 40 mg 1.5 tablets (60 mg total) daily and may take an extra 60 mg as needed. Potassium 20 mEq 1 tablet every other day.   Labs:  03/11/2016 Creatinine 0.98, BUN 17, Potassium 2.9, Sodium 136 01/18/2016 Creatinine 1.11, BUN 19, Potassium 4.5, Sodium 134, EGFR 52->60 01/17/2016 Creatinine 0.90, BUN 13, Potassium 5.1, Sodium 136, EGFR 68-78  01/16/2016 Creatinine 1.02, BUN 15, Potassium 3.4, Sodium 137, EGFR 58-67  11//28/2017 Creatinine 1.09, BUN 10, Potassium 3.0, Sodium 138, EGFR 54-62, BNP 88  08/17/2017Creatinine 1.12, BUN 27, Potassium 3.6, Sodium 134, EGFR 50-58 10/03/2015 Creatinine 1.02, BUN 20, Potassium 3.4, Sodium 136, EGFR 56->60  05/26/2015 Creatinine 1.01, BUN 12, Potassium 4.2, Sodium 137  Recommendations:  Patient being seen in Dr Nelly Laurence office today and any recommendations will be made at that time if needed.   Follow-up plan: ICM clinic phone appointment on 07/01/2016.  Office appointment today.   Copy of ICM check sent to primary cardiologist and device physician.   3 month ICM trend: 06/06/2016   1 Year ICM trend:      Rosalene Billings, RN 06/09/2016 7:39 AM

## 2016-06-09 NOTE — Patient Instructions (Signed)
Your physician wants you to follow-up in: 4 MONTHS WITH DR BRANCH You will receive a reminder letter in the mail two months in advance. If you don't receive a letter, please call our office to schedule the follow-up appointment.  Your physician recommends that you continue on your current medications as directed. Please refer to the Current Medication list given to you today.  Thank you for choosing [facility]!!   

## 2016-06-09 NOTE — Progress Notes (Signed)
Clinical Summary Becky [name] is a 66 y.o.female female seen today for follow up of the following medical problems.  1. ICM/Chronic systolic heart failure - history of multiple PCIs as described below, mainly in [place].  - 04/2013 LVEF 30-35%. Repeat echo after BiV AICD 05/2014 shows LVEF 16%, grade I diastolic dysfunction.  - 09/2015 echo LVEF 55-60%, no WMAs, grade I diastolic dysfunction - 11/9602 echo [place]: LVEF 65-70%, moderate LVH.  - medical therapy has been limited by orthostatic symtpoms -she has been on long term plavix due to her extensive stent burden    - no recent SOB. No recent edema. Not doing home weights. Mild change in impedance on last ICD check, no new symptoms.  - compliant with meds. Limiting sodium intake.    2. OSA  - remains compliant with CPAP  3. Hyperlipidemia - muscle aches on multiple statins - tolerating pravastatin   4. HTN -compliant with meds  5. COPD - followed by pulmonary in [place]  - recent admit in [facility] 12/2015 for SOB and COPD exacerbation   - recent admission 03/2016 with SOB, recent admission in [facility]. She reports a 10 day stay for pneumonia and COPD exacerbation, discharged to rehab. Spent 1 month at rehab, just discharged home.  - breathing is back to baseline.    Past Medical History:  Diagnosis Date  . AICD (automatic cardioverter/defibrillator) present   . Anxiety   . Arthritis    "all my joints; neck, back, legs, arms, elbows"  (10/03/2015)  . Asthma   . CAD (coronary artery disease)    a. 5/08: s/p DES to PDA and DES to LAD; b. 1/09: s/p DES to pLAD; c. 1/11: s/p DES to LAD x 2, d. LHC (3/12 in [place] - EF 55%, mild ant HK, nl LM, LAD stents ok, oLAD 30, RCA stents ok;  e.  4/13:  s/p DES to RCA, f. 1/14: dLM 20-30, mOM3 20-30, mLAD 80 ISR => s/p 2.5x16 mm Promus Element DES; EF 45-50%   . CHF (congestive heart failure) ([place])   . Chronic bronchitis ([place])     . Chronic lower back pain    "into pelvis region" (10/03/2015)  . COPD (chronic obstructive pulmonary disease) ([place])   . Daily headache    "might be from sinus" (10/03/2015)  . Depression    hx (10/03/2015)  . Fibromyalgia   . GERD (gastroesophageal reflux disease)   . Heart murmur   . HLD (hyperlipidemia)   . Hypertension   . Ischemic cardiomyopathy    a. Echo (11/14):  EF 25%   . LBBB (left bundle Jamicah Anstead block)   . Myocardial infarction 12/2012  . OSA on CPAP   . Pneumonia 1950s  . Syncope 11/14   LifeVest placed  . Upper back pain, chronic    "into my shoulders; fibromyalgia works here" (10/03/2015)     Allergies  Allergen Reactions  . Tape Rash and Other (See Comments)    Severe Reaction (latex tape): Burned hand, Redness-took days to clear up (03/02/12)  . Codeine Nausea And Vomiting  . Prozac [Fluoxetine Hcl] Other (See Comments)    Mean/violent thoughts  . Fosamax [Alendronate Sodium] Nausea Only and Other (See Comments)    Stomach pain  . Keflex [Cephalexin] Nausea Only  . Lipitor [Atorvastatin] Other (See Comments)    Leg pain     Current Outpatient Prescriptions  Medication Sig Dispense Refill  . acetaminophen-codeine (TYLENOL #3) 300-30 MG tablet Take 1 tablet  by mouth every 6 (six) hours as needed for moderate pain.   2  . Aspirin-Salicylamide-Caffeine (BC HEADACHE PO) Take 1 each by mouth daily.    . benzonatate (TESSALON) 100 MG capsule Take 2 capsules by mouth 3 (three) times daily as needed.  0  . calcium carbonate (CALCIUM 600) 1500 (600 Ca) MG TABS tablet Take 600 mg of elemental calcium by mouth daily with breakfast.    . Cholecalciferol (VITAMIN D) 2000 UNITS CAPS Take 2,000 Units by mouth daily.    . clopidogrel (PLAVIX) 75 MG tablet Take 75 mg by mouth daily.     Becky Kitchen Dextromethorphan-Guaifenesin (MUCINEX DM) 30-600 MG TB12 Take 1 tablet by mouth 2 (two) times daily as needed.  0  . escitalopram (LEXAPRO) 20 MG tablet Take 20 mg by mouth daily.     .  fluticasone (FLONASE) 50 MCG/ACT nasal spray Place 2 sprays into both nostrils daily.  7  . furosemide (LASIX) 40 MG tablet TAKE 60 MG DAILY - MAY TAKE EXTRA 60 MG AS NEEDED 90 tablet 3  . ipratropium-albuterol (DUONEB) 0.5-2.5 (3) MG/3ML SOLN Take 3 mLs by nebulization every 6 (six) hours as needed (shortness of breath).    . lactulose (CHRONULAC) 10 GM/15ML solution Take 10 g by mouth 2 (two) times daily as needed for mild constipation.    . magnesium oxide (MAG-OX) 400 MG tablet Take 400 mg by mouth daily.    . meclizine (ANTIVERT) 25 MG tablet Take 1 tablet by mouth 2 (two) times daily as needed.  3  . metoprolol succinate (TOPROL-XL) 100 MG 24 hr tablet Take 1 tablet (100 mg total) by mouth daily. 90 tablet 3  . montelukast (SINGULAIR) 10 MG tablet Take 10 mg by mouth every morning.     . nitroGLYCERIN (NITROSTAT) 0.4 MG SL tablet Place 1 tablet (0.4 mg total) under the tongue every 5 (five) minutes x 3 doses as needed. If no relief after 3rd dose, proceed to ED 25 tablet 5  . pantoprazole (PROTONIX) 40 MG tablet TAKE 1 TABLET BY MOUTH DAILY 30 tablet 6  . potassium chloride SA (K-DUR,KLOR-CON) 20 MEQ tablet Take 1 tablet (20 mEq total) by mouth every other day. 45 tablet 3  . pravastatin (PRAVACHOL) 40 MG tablet Take 1 tablet (40 mg total) by mouth every evening. 90 tablet 3  . predniSONE (DELTASONE) 10 MG tablet Take 10 mg by mouth as directed.    Becky Kitchen rOPINIRole (REQUIP) 0.5 MG tablet Take 0.5 mg by mouth at bedtime.     Becky Kitchen spironolactone (ALDACTONE) 25 MG tablet TAKE 1/2 TABLET BY MOUTH DAILY 15 tablet 6  . topiramate (TOPAMAX) 25 MG tablet Take 50 mg by mouth at bedtime.     No current facility-administered medications for this visit.      Past Surgical History:  Procedure Laterality Date  . BI-VENTRICULAR IMPLANTABLE CARDIOVERTER DEFIBRILLATOR N/A 06/02/2013   Procedure: BI-VENTRICULAR IMPLANTABLE CARDIOVERTER DEFIBRILLATOR  (CRT-D);  Surgeon: Coralyn Mark, MD;  Location: [facility] CATH LAB;   Service: Cardiovascular;  Laterality: N/A;  . BI-VENTRICULAR IMPLANTABLE CARDIOVERTER DEFIBRILLATOR  (CRT-D)  06/02/2013   MDT VivaQuad CRTD implanted by Dr Rayann Heman for ICM, CHF  . CARDIAC CATHETERIZATION  12/2012   S/P MI  . CORONARY ANGIOPLASTY WITH STENT PLACEMENT  2007; 2009; 2011   "?2 +1 +1 "  . CORONARY ANGIOPLASTY WITH STENT PLACEMENT  02/2012  . FRACTURE SURGERY    . HYSTEROSCOPY W/ ENDOMETRIAL ABLATION  ~ 1998  . LEAD REVISION  11-08-13   RA and LV lead revision by Dr Rayann Heman  . LEAD REVISION Left 11/08/2013   Procedure: LEAD REVISION;  Surgeon: Coralyn Mark, MD;  Location: [facility] CATH LAB;  Service: Cardiovascular;  Laterality: Left;  . LEFT HEART CATHETERIZATION WITH CORONARY ANGIOGRAM N/A 01/03/2013   Procedure: LEFT HEART CATHETERIZATION WITH CORONARY ANGIOGRAM;  Surgeon: Burnell Blanks, MD;  Location: [facility] CATH LAB;  Service: Cardiovascular;  Laterality: N/A;  . ORIF ANKLE FRACTURE Right 10/21/2009  . TUBAL LIGATION  1989     Allergies  Allergen Reactions  . Tape Rash and Other (See Comments)    Severe Reaction (latex tape): Burned hand, Redness-took days to clear up (03/02/12)  . Codeine Nausea And Vomiting  . Prozac [Fluoxetine Hcl] Other (See Comments)    Mean/violent thoughts  . Fosamax [Alendronate Sodium] Nausea Only and Other (See Comments)    Stomach pain  . Keflex [Cephalexin] Nausea Only  . Lipitor [Atorvastatin] Other (See Comments)    Leg pain      Family History  Problem Relation Age of Onset  . Breast cancer Mother   . Heart failure Brother   . Breast cancer Sister   . Breast cancer Sister   . Uterine cancer Sister      Social History Ms. [name] reports that she has been smoking Cigarettes.  She started smoking about 25 years ago. She has a 15.00 pack-year smoking history. She has never used smokeless tobacco. Becky [name] reports that she drinks alcohol.   Review of Systems CONSTITUTIONAL: No weight loss, fever, chills, weakness or  fatigue.  HEENT: Eyes: No visual loss, blurred vision, double vision or yellow sclerae.No hearing loss, sneezing, congestion, runny nose or sore throat.  SKIN: No rash or itching.  CARDIOVASCULAR: per HPI RESPIRATORY: No shortness of breath, cough or sputum.  GASTROINTESTINAL: No anorexia, nausea, vomiting or diarrhea. No abdominal pain or blood.  GENITOURINARY: No burning on urination, no polyuria NEUROLOGICAL: No headache, dizziness, syncope, paralysis, ataxia, numbness or tingling in the extremities. No change in bowel or bladder control.  MUSCULOSKELETAL: No muscle, back pain, joint pain or stiffness.  LYMPHATICS: No enlarged nodes. No history of splenectomy.  PSYCHIATRIC: No history of depression or anxiety.  ENDOCRINOLOGIC: No reports of sweating, cold or heat intolerance. No polyuria or polydipsia.  Becky Kitchen   Physical Examination Vitals:   06/09/16 0919  BP: (!) 131/59  Pulse: 60   Vitals:   06/09/16 0919  Weight: 190 lb 12.8 oz (86.5 kg)  Height: 5' (1.524 m)    Gen: resting comfortably, no acute distress HEENT: no scleral icterus, pupils equal round and reactive, no palptable cervical adenopathy,  CV: RRR, no m/r/g, no jvd Resp: Clear to auscultation bilaterally GI: abdomen is soft, non-tender, non-distended, normal bowel sounds, no hepatosplenomegaly MSK: extremities are warm, no edema.  Skin: warm, no rash Neuro:  no focal deficits Psych: appropriate affect   Diagnostic Studies  04/2010 Cath [place], [place]: LVEF 55%, mild anterior hypokinesis. LM normal, LAD with stents in prox and mid portion widely patent, LAD ostial 30%. Diags with luminal irregs. LCX with luminial irregs. RCA with distal patent stents  Stent cards May 2008 DES to PDA, DES to LAD  Jan 2009 DES to prox LAD  Mar 05 2009 DES to LAD x2  05/2011 DES to RCA  Jan 2014 stent done [place], [place]  03/2010 Echo: LVEF 40%, hypokinesis of the anteroseptum.   01/02/13 Cath Hemodynamic  Findings: Central aortic pressure: 147/79  Left ventricular  pressure: 130/22/26  Angiographic Findings: Left main: 30% distal stenosis. It appears that the distal left main has a stent that continues into the LAD.  Left Anterior Descending Artery: Large vessel that courses to the apex. The entire proximal and mid LAD is stented. There is mild stent restenosis in the proximal segment. The mid stented segment has diffuse 30% stent restenosis. The distal vessel becomes small in caliber and has mild diffuse plaque. There is a moderate caliber first diagonal Advait Buice with mild plaque disease.  Circumflex Artery: Moderate caliber non-dominant vessel with three moderate caliber obtuse marginal branches. No obstructive disease.  Right Coronary Artery: Large dominant vessel with 30% proximal stenosis, heavily calcified mid vessel with patent stent in the mid vessel (no significant restenosis). The distal vessel has diffuse 40-50% stenosis. The posterolateral Clorine Swing and PDA are moderate caliber, patent vessels with mild plaque disease.  Left Ventricular Angiogram: LVEF=25-30%. Hypokinesis of the antero-apical wall.  Impression:  1. 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 Elevated troponin following syncopal event. No culprit lesions seen.  3. Moderate to severe LV systolic dysfunction.  Recommendations: Continue medical management of CAD. Workup for syncope is underway. Carotid dopplers today. Will check d-dimer as PE is a possibility. She could also have had an arrythmia with LV dysfunction.  Complications: None. The patient tolerated the procedure well.  01/03/13 Echo - LVEF 25-30%, akinesis of the anteroseptal and apical myocardium, grade I diastolic dysfunction,  01/75/10 Carotid US - The vertebral arteries appear patent with antegrade flow. - Findings consistent with1- 39 percent stenosis,high end of scale,involving the right internal carotid artery. - Findings  consistent with 40 - 59 percent stenosis, low end of scale,involving the left internal carotid artery. - ICA/CCA ratio. right =1.72. left = 1.52 Other specific details can be found in the table(s) above. Prepared and Electronically Authenticated by 12/13/12 Event monitor No symptoms reported, sinus rhythm with conduction delay, occasional PVCs.  04/2013 Echo Study Conclusions  - Left ventricle: The cavity size was normal. Wall thickness was increased in a pattern of mild LVH with moderate basal septal hypertrophy. Systolic function was moderately to severely reduced. The estimated ejection fraction was in the range of 30% to 35%. There is akinesis to dyskinesis of the mid-distal anteroseptal and apical myocardium. There is akinesis of the distalinferoseptal myocardium. Doppler parameters are consistent with abnormal left ventricular relaxation (grade 1 diastolic dysfunction). - Ventricular septum: Septal motion showed abnormal function and dyssynergy. - Aortic valve: Mildly calcified annulus. Probably trileaflet; mildly calcified leaflets. No significant regurgitation. - Mitral valve: Calcified annulus. Trivial regurgitation. - Left atrium: The atrium was mildly dilated. - Right atrium: Central venous pressure: 51m Hg (est). - Tricuspid valve: Trivial regurgitation. - Pulmonary arteries: PA peak pressure: 226mHg (S). - Pericardium, extracardiac: There was no pericardial effusion. Impressions:  - MiFingalith moderate basal septal hypertrophy, LVEF 30-35% with wall motion abnormalities as outlined, grade 1 diastolic dysfunction. Mild left atrial enlagement. MAC with trivial mitral regurgitation. MIldlysclerotic aortic valve. Trivial tricuspid regurgitation with normal PASP 20 mmHg. 05/2014 echo Study Conclusions  - Left ventricle: Technically limited study. The cavity size was normal. Wall thickness was increased in a pattern of moderate LVH. The estimated  ejection fraction was 55%. Doppler parameters are consistent with abnormal left ventricular relaxation (grade 1 diastolic dysfunction). - Aortic valve: Sclerosis without stenosis. - Right ventricle: The cavity size was normal. Pacer wire or catheter noted in right ventricle. Systolic function was normal. - Impressions:  Since the study in 2015, there is definite improvement in LV function.  Impressions:  - Since the study in 2015, there is definite improvement in LV function.   09/2015 Nuclear Stress  There was no ST segment deviation noted during stress.  No T wave inversion was noted during stress.  Defect 1: There is a medium defect of mild severity present in the basal inferior and mid inferior location. This defect is likely a result of bowel loop attenuation artifact. No significant ischemia identified.  This is a low risk study. No significant ischemia identified.  Nuclear stress EF: 54%. Mid to apical septal wall hypokinesis noted   09/2015 echo Study Conclusions  - Left ventricle: The cavity size was normal. Systolic function was normal. The estimated ejection fraction was in the range of 55% to 60%. Wall motion was normal; there were no regional wall motion abnormalities. Doppler parameters are consistent with abnormal left ventricular relaxation (grade 1 diastolic dysfunction). Doppler parameters are consistent with indeterminate ventricular filling pressure. - Aortic valve: Transvalvular velocity was within the normal range. There was no stenosis. There was no regurgitation. - Mitral valve: Transvalvular velocity was within the normal range. There was no evidence for stenosis. There was no regurgitation. - Left atrium: The atrium was moderately dilated. - Right ventricle: The cavity size was normal. Wall thickness was normal. Systolic function was normal. - Tricuspid valve: There was no regurgitation.     Assessment and  Plan  1. ICM/Chronic systolic heart failure  - previous LVEF 30-35%. After BiV AICD repeat echo with normalized LVEF - medical therapy has been limited due to orthstatic symptoms - indefinite plavix due to large stent burden  - no recent symptoms, appears euvolemic. Continue current meds.   2. OSA - she will continue CPAP  3. Hyperlipidemia - difficulty tolerating statins in the past. Toleratingpravastatin currently, she will continue.   4. HTN -bp is at goal, she will continue current meds   5. COPD - reqeust records from [place] regarding recent admission with COPD exacerbation.   F/u 4 months      Arnoldo Lenis, M.D.

## 2016-06-10 NOTE — Progress Notes (Signed)
No new recommendations at office visit with Dr Wyline Mood.

## 2016-07-01 ENCOUNTER — Ambulatory Visit (INDEPENDENT_AMBULATORY_CARE_PROVIDER_SITE_OTHER): Payer: Medicare Other

## 2016-07-01 ENCOUNTER — Telehealth: Payer: Self-pay

## 2016-07-01 DIAGNOSIS — I5042 Chronic combined systolic (congestive) and diastolic (congestive) heart failure: Secondary | ICD-10-CM

## 2016-07-01 DIAGNOSIS — Z95810 Presence of automatic (implantable) cardiac defibrillator: Secondary | ICD-10-CM

## 2016-07-01 NOTE — Progress Notes (Signed)
EPIC Encounter for ICM Monitoring  Patient Name: Becky [name] is a 66 y.o. female Date: 07/01/2016 Primary Care Physican: Joseph Art, MD Primary Cardiologist:Branch Electrophysiologist: Allred Dry Weight:185 lbs Bi-V Pacing: 96.7%       Heart Failure questions reviewed, pt symptomatic with feet swelling feet and 2 lb weight gain.   Thoracic impedance abnormal suggesting fluid accumulation since 06/06/2016.  Prescribed dosage: Furosemide 40 mg 1.5 tablets (60 mg total) daily and may take an extra 60 mg as needed. Potassium 20 mEq 1 tablet every other day.   Labs:  05/05/2016 Creatinine 1.05, BUN 19, Potassium 3.6, Sodium 135, EGFR 64 05/04/2016 Creatinine 0.93, BUN 21, Potassium 3.0, Sodium 133, EGFR 75 05/03/2016 Creatinine 0.93, BUN 24, Potassium 3.5, Sodium 134, EGFR 75 03/11/2016 Creatinine 0.98, BUN 17, Potassium 2.9, Sodium 136 01/18/2016 Creatinine 1.11, BUN 19, Potassium 4.5, Sodium 134, EGFR 52->60 01/17/2016 Creatinine 0.90, BUN 13, Potassium 5.1, Sodium 136, EGFR 68-78  01/16/2016 Creatinine 1.02, BUN 15, Potassium 3.4, Sodium 137, EGFR 58-67  11//28/2017 Creatinine 1.09, BUN 10, Potassium 3.0, Sodium 138, EGFR 54-62, BNP 88  08/17/2017Creatinine 1.12, BUN 27, Potassium 3.6, Sodium 134, EGFR 50-58 10/03/2015 Creatinine 1.02, BUN 20, Potassium 3.4, Sodium 136, EGFR 56->60  05/26/2015 Creatinine 1.01, BUN 12, Potassium 4.2, Sodium 137  Recommendations: Patient has already taken extra 60 mg Furosemide yesterday, 06/30/2016 and will take extra 60 mg for next 2 days. Advised to take extra Potassium when taking the additional Lasix.  She verbalized understanding.   Follow-up plan: ICM clinic phone appointment on 07/04/2016 to recheck fluid levels  Copy of ICM check sent to primary cardiologist and device physician.   3 month ICM trend: 07/01/2016   1 Year ICM trend:      Rosalene Billings, RN 07/01/2016 8:11 AM

## 2016-07-01 NOTE — Telephone Encounter (Signed)
Remote ICM transmission received. Spoke to patient.

## 2016-07-04 ENCOUNTER — Telehealth: Payer: Self-pay | Admitting: Cardiology

## 2016-07-04 ENCOUNTER — Ambulatory Visit (INDEPENDENT_AMBULATORY_CARE_PROVIDER_SITE_OTHER): Payer: Self-pay

## 2016-07-04 DIAGNOSIS — Z95810 Presence of automatic (implantable) cardiac defibrillator: Secondary | ICD-10-CM

## 2016-07-04 DIAGNOSIS — I5042 Chronic combined systolic (congestive) and diastolic (congestive) heart failure: Secondary | ICD-10-CM

## 2016-07-04 NOTE — Telephone Encounter (Signed)
LMOVM reminding pt to send remote transmission.   

## 2016-07-08 ENCOUNTER — Telehealth: Payer: Self-pay

## 2016-07-08 NOTE — Telephone Encounter (Signed)
Remote ICM transmission received.  Attempted patient call and left message to return call regarding transmission.    

## 2016-07-08 NOTE — Progress Notes (Addendum)
EPIC Encounter for ICM Monitoring  Patient Name: Becky [name] is a 66 y.o. female Date: 07/08/2016 Primary Care Physican: Joseph Art, MD Primary Cardiologist:Branch Electrophysiologist: Allred Dry Weight:Last ICM weight 185 lbs Bi-V Pacing: 98.3%          Attempted call to patient and unable to reach.  Left message to return call.  Transmission reviewed.    Thoracic impedance returned to normal today but continues to have large valleys in impedance line suggesting fluid accumulation.  Prescribed dosage: Furosemide 40 mg 1.5 tablets (60 mg total) daily and may take an extra 60 mg as needed. Potassium 20 mEq 1 tablet every other day. Fluid index remains >threshold.   Labs:  05/05/2016 Creatinine 1.05, BUN 19, Potassium 3.6, Sodium 135, EGFR 64 05/04/2016 Creatinine 0.93, BUN 21, Potassium 3.0, Sodium 133, EGFR 75 05/03/2016 Creatinine 0.93, BUN 24, Potassium 3.5, Sodium 134, EGFR 75 03/11/2016 Creatinine 0.98, BUN 17, Potassium 2.9, Sodium 136 01/18/2016 Creatinine 1.11, BUN 19, Potassium 4.5, Sodium 134, EGFR 52->60 01/17/2016 Creatinine 0.90, BUN 13, Potassium 5.1, Sodium 136, EGFR 68-78  01/16/2016 Creatinine 1.02, BUN 15, Potassium 3.4, Sodium 137, EGFR 58-67  01/15/2016 Creatinine 1.09, BUN 10, Potassium 3.0, Sodium 138, EGFR 54-62, BNP 88  08/17/2017Creatinine 1.12, BUN 27, Potassium 3.6, Sodium 134, EGFR 50-58 10/03/2015 Creatinine 1.02, BUN 20, Potassium 3.4, Sodium 136, EGFR 56->60  05/26/2015 Creatinine 1.01, BUN 12, Potassium 4.2, Sodium 137  Recommendations:  Patient adjusts Furosemide and Potassium based on symptoms. None, unable to reach.  Follow-up plan: ICM clinic phone appointment on 07/22/2016 to recheck fluid levels.  Copy of ICM check sent to primary cardiologist and device physician.   3 month ICM trend: 07/08/2016   1 Year ICM trend:      Rosalene Billings, RN 07/08/2016 11:05 AM

## 2016-07-11 NOTE — Progress Notes (Signed)
Patient returned call.  Transmission reviewed.  Explained she is large amounts of fluid retention and then when she takes extra fluid medication it goes back to base but quickly starts to re-accumulate.  She said she is not limiting fluid intake which may contribute to the fluid accumulation. Advised to limit fluid intake for total of 64 oz a day.  She says she looks at food labels for salt amounts and advise to limit to 2000 mg.  She reported feeling bloated today and took extra Furosemide.  Next ICM remote transmission 07/22/2016.  She will be calling to set up appointment with Dr Wyline Mood.

## 2016-07-12 ENCOUNTER — Other Ambulatory Visit: Payer: Self-pay | Admitting: Cardiology

## 2016-07-22 ENCOUNTER — Ambulatory Visit (INDEPENDENT_AMBULATORY_CARE_PROVIDER_SITE_OTHER): Payer: Self-pay

## 2016-07-22 ENCOUNTER — Telehealth: Payer: Self-pay

## 2016-07-22 DIAGNOSIS — I5042 Chronic combined systolic (congestive) and diastolic (congestive) heart failure: Secondary | ICD-10-CM

## 2016-07-22 DIAGNOSIS — Z95810 Presence of automatic (implantable) cardiac defibrillator: Secondary | ICD-10-CM

## 2016-07-22 NOTE — Telephone Encounter (Signed)
Remote ICM transmission received.  Attempted patient call and the number, [phone] is not in service.

## 2016-07-22 NOTE — Progress Notes (Signed)
EPIC Encounter for ICM Monitoring  Patient Name: ELAINE ROANHORSE is a 66 y.o. female Date: 07/22/2016 Primary Care Physican: Joseph Art, MD Primary Cardiologist:Branch Electrophysiologist: Allred Dry Weight: Last ICM weight 185lbs Bi-V Pacing: 98.4%           Attempted call to patient and unable to reach.  Transmission reviewed.    Thoracic impedance normal with a pattern of multiple peaks and valleys suggesting periods of fluid accumulation with dryness.  Prescribed dosage: Furosemide 40 mg 1.5 tablets (60 mg total) daily and may take an extra 60 mg as needed. Potassium 20 mEq 1 tablet every other day.    Labs:  05/05/2016 Creatinine 1.05, BUN 19, Potassium 3.6, Sodium 135, EGFR 64 05/04/2016 Creatinine 0.93, BUN 21, Potassium 3.0, Sodium 133, EGFR 75 05/03/2016 Creatinine 0.93, BUN 24, Potassium 3.5, Sodium 134, EGFR 75 03/11/2016 Creatinine 0.98, BUN 17, Potassium 2.9, Sodium 136 01/18/2016 Creatinine 1.11, BUN 19, Potassium 4.5, Sodium 134, EGFR 52->60 01/17/2016 Creatinine 0.90, BUN 13, Potassium 5.1, Sodium 136, EGFR 68-78  01/16/2016 Creatinine 1.02, BUN 15, Potassium 3.4, Sodium 137, EGFR 58-67  01/15/2016 Creatinine 1.09, BUN 10, Potassium 3.0, Sodium 138, EGFR 54-62, BNP 88  08/17/2017Creatinine 1.12, BUN 27, Potassium 3.6, Sodium 134, EGFR 50-58 10/03/2015 Creatinine 1.02, BUN 20, Potassium 3.4, Sodium 136, EGFR 56->60  05/26/2015 Creatinine 1.01, BUN 12, Potassium 4.2, Sodium 137  Recommendations: NONE - Unable to reach patient   Follow-up plan: ICM clinic phone appointment on 08/25/2016.  Office appointment scheduled on 10/10/2016 with Dr. Harl Bowie.  Copy of ICM check sent to primary cardiologist and device physician.   3 month ICM trend: 07/22/2016   1 Year ICM trend:      Rosalene Billings, RN 07/22/2016 7:49 AM

## 2016-07-29 NOTE — Progress Notes (Signed)
Patient returned call. She said they have been having phone problems but is fixed now.  She said she felt like she had some fluid today and took extra Furosemide.  Advised to call back if she does not think the extra fluid pill is working.  Advised next ICM remote transmission is 08/25/2016 and encouraged to call before then if she is having fluid symptoms that are not relieved by medication.

## 2016-07-31 ENCOUNTER — Telehealth: Payer: Self-pay

## 2016-07-31 NOTE — Telephone Encounter (Signed)
Attempted return call to patient as requested by voice mail message stating she took extra fluid pill on 07/30/2016 and sent a ICM remote transmission for review.  No answer and mail box is full.    Transmission reviewed and thoracic impedance at baseline since 07/25/2016 but was abnormal from 07/21/2016 to 07/25/2016.Marland Kitchen    [employer]

## 2016-08-01 NOTE — Telephone Encounter (Signed)
Call to patient and she stated since taking extra fluid pill she is feeling fine.  No further fluid symptoms. She said she was diagnosed, by PCP, with Diabetes today and was started on Metformin.   No changes and next ICM remote transmission 08/25/2016.  Encouraged her to call for any further problems with fluid symptoms.

## 2016-08-18 ENCOUNTER — Other Ambulatory Visit: Payer: Self-pay | Admitting: Cardiology

## 2016-08-25 ENCOUNTER — Telehealth: Payer: Self-pay

## 2016-08-25 ENCOUNTER — Ambulatory Visit (INDEPENDENT_AMBULATORY_CARE_PROVIDER_SITE_OTHER): Payer: Medicare Other

## 2016-08-25 ENCOUNTER — Ambulatory Visit (INDEPENDENT_AMBULATORY_CARE_PROVIDER_SITE_OTHER): Payer: Medicare Other | Admitting: *Deleted

## 2016-08-25 DIAGNOSIS — I255 Ischemic cardiomyopathy: Secondary | ICD-10-CM

## 2016-08-25 DIAGNOSIS — Z95810 Presence of automatic (implantable) cardiac defibrillator: Secondary | ICD-10-CM | POA: Diagnosis not present

## 2016-08-25 DIAGNOSIS — I5042 Chronic combined systolic (congestive) and diastolic (congestive) heart failure: Secondary | ICD-10-CM | POA: Diagnosis not present

## 2016-08-25 NOTE — Progress Notes (Signed)
EPIC Encounter for ICM Monitoring  Patient Name: Becky [name] is a 66 y.o. female Date: 08/25/2016 Primary Care Physican: Joseph Art, MD Primary Cardiologist:Branch Electrophysiologist: Allred Dry Weight: Last ICM weight 185lbs Bi-V Pacing: 98.3%      Attempted call to patient and unable to reach.  Left detailed message regarding transmission.  Transmission reviewed.    Thoracic impedance normal.  Prescribed dosage: Furosemide 40 mg 1.5 tablets (60 mg total) daily and may take an extra 60 mg as needed. Potassium 20 mEq 1 tablet every other day.    Labs:  05/05/2016 Creatinine 1.05, BUN 19, Potassium 3.6, Sodium 135, EGFR 64 05/04/2016 Creatinine 0.93, BUN 21, Potassium 3.0, Sodium 133, EGFR 75 05/03/2016 Creatinine 0.93, BUN 24, Potassium 3.5, Sodium 134, EGFR 75 03/11/2016 Creatinine 0.98, BUN 17, Potassium 2.9, Sodium 136 01/18/2016 Creatinine 1.11, BUN 19, Potassium 4.5, Sodium 134, EGFR 52->60 01/17/2016 Creatinine 0.90, BUN 13, Potassium 5.1, Sodium 136, EGFR 68-78  01/16/2016 Creatinine 1.02, BUN 15, Potassium 3.4, Sodium 137, EGFR 58-67  01/15/2016 Creatinine 1.09, BUN 10, Potassium 3.0, Sodium 138, EGFR 54-62, BNP 88  08/17/2017Creatinine 1.12, BUN 27, Potassium 3.6, Sodium 134, EGFR 50-58 10/03/2015 Creatinine 1.02, BUN 20, Potassium 3.4, Sodium 136, EGFR 56->60  05/26/2015 Creatinine 1.01, BUN 12, Potassium 4.2, Sodium 137   Recommendations: Left voice mail with ICM number and encouraged to call for fluid symptoms.  Follow-up plan: ICM clinic phone appointment on 09/25/2016.  Office appointment scheduled 10/10/2016 with Dr. Harl Bowie.  Copy of ICM check sent to device physician.   3 month ICM trend: 08/25/2016   1 Year ICM trend:      Rosalene Billings, RN 08/25/2016 11:34 AM

## 2016-08-25 NOTE — Telephone Encounter (Signed)
Remote ICM transmission received.  Attempted patient call and left detailed message regarding transmission and next ICM scheduled for 09/25/2016.  Advised to return call for any fluid symptoms or questions.    

## 2016-08-25 NOTE — Progress Notes (Signed)
Remote ICD transmission.   

## 2016-08-26 LAB — CUP PACEART REMOTE DEVICE CHECK
Battery Voltage: 2.95 V
Brady Statistic AP VP Percent: 6.76 %
Brady Statistic AS VS Percent: 1.39 %
Brady Statistic RA Percent Paced: 6.89 %
Brady Statistic RV Percent Paced: 4.08 %
HIGH POWER IMPEDANCE MEASURED VALUE: 92 Ohm
Implantable Lead Implant Date: 20150416
Implantable Lead Implant Date: 20150922
Implantable Lead Location: 753858
Implantable Lead Location: 753859
Implantable Lead Location: 753860
Implantable Lead Model: 4598
Implantable Lead Model: 5076
Implantable Lead Model: 6935
Lead Channel Impedance Value: 304 Ohm
Lead Channel Impedance Value: 304 Ohm
Lead Channel Impedance Value: 399 Ohm
Lead Channel Impedance Value: 399 Ohm
Lead Channel Impedance Value: 456 Ohm
Lead Channel Impedance Value: 532 Ohm
Lead Channel Impedance Value: 532 Ohm
Lead Channel Impedance Value: 665 Ohm
Lead Channel Pacing Threshold Amplitude: 0.75 V
Lead Channel Pacing Threshold Pulse Width: 0.4 ms
Lead Channel Pacing Threshold Pulse Width: 0.8 ms
Lead Channel Sensing Intrinsic Amplitude: 4.25 mV
Lead Channel Sensing Intrinsic Amplitude: 8 mV
Lead Channel Setting Pacing Amplitude: 2 V
Lead Channel Setting Pacing Amplitude: 2 V
Lead Channel Setting Pacing Amplitude: 2.75 V
MDC IDC LEAD IMPLANT DT: 20150922
MDC IDC MSMT BATTERY REMAINING LONGEVITY: 27 mo
MDC IDC MSMT LEADCHNL LV IMPEDANCE VALUE: 323 Ohm
MDC IDC MSMT LEADCHNL LV IMPEDANCE VALUE: 342 Ohm
MDC IDC MSMT LEADCHNL LV IMPEDANCE VALUE: 342 Ohm
MDC IDC MSMT LEADCHNL LV IMPEDANCE VALUE: 646 Ohm
MDC IDC MSMT LEADCHNL LV IMPEDANCE VALUE: 722 Ohm
MDC IDC MSMT LEADCHNL LV PACING THRESHOLD AMPLITUDE: 2.25 V
MDC IDC MSMT LEADCHNL RA PACING THRESHOLD AMPLITUDE: 0.625 V
MDC IDC MSMT LEADCHNL RA PACING THRESHOLD PULSEWIDTH: 0.4 ms
MDC IDC MSMT LEADCHNL RA SENSING INTR AMPL: 4.25 mV
MDC IDC MSMT LEADCHNL RV SENSING INTR AMPL: 8 mV
MDC IDC PG IMPLANT DT: 20150416
MDC IDC SESS DTM: 20180709052302
MDC IDC SET LEADCHNL LV PACING PULSEWIDTH: 0.8 ms
MDC IDC SET LEADCHNL RV PACING PULSEWIDTH: 0.4 ms
MDC IDC SET LEADCHNL RV SENSING SENSITIVITY: 0.3 mV
MDC IDC STAT BRADY AP VS PERCENT: 0.13 %
MDC IDC STAT BRADY AS VP PERCENT: 91.72 %

## 2016-09-01 ENCOUNTER — Encounter: Payer: Self-pay | Admitting: Cardiology

## 2016-09-16 ENCOUNTER — Other Ambulatory Visit: Payer: Self-pay | Admitting: Cardiology

## 2016-09-25 ENCOUNTER — Telehealth: Payer: Self-pay | Admitting: Cardiology

## 2016-09-25 ENCOUNTER — Ambulatory Visit (INDEPENDENT_AMBULATORY_CARE_PROVIDER_SITE_OTHER): Payer: Medicare Other

## 2016-09-25 DIAGNOSIS — Z95810 Presence of automatic (implantable) cardiac defibrillator: Secondary | ICD-10-CM

## 2016-09-25 DIAGNOSIS — I5042 Chronic combined systolic (congestive) and diastolic (congestive) heart failure: Secondary | ICD-10-CM | POA: Diagnosis not present

## 2016-09-25 NOTE — Telephone Encounter (Signed)
LMOVM reminding pt to send remote transmission.   

## 2016-09-26 NOTE — Progress Notes (Signed)
EPIC Encounter for ICM Monitoring  Patient Name: Becky [name] is a 66 y.o. female Date: 09/26/2016 Primary Care Physican: Joseph Art, MD Primary Cardiologist:Branch Electrophysiologist: Allred Dry Weight: 183lbs Bi-V Pacing: 98.2%          Heart Failure questions reviewed, pt asymptomatic.   Thoracic impedance at normal today but was abnormal suggesting fluid accumulation 09/13/2016 through 09/26/2016.  Prescribed dosage: Furosemide 40 mg 1.5 tablets (60 mg total) daily and may take an extra 60 mg as needed. Potassium 20 mEq 1 tablet every other day.   Labs:  05/05/2016 Creatinine 1.05, BUN 19, Potassium 3.6, Sodium 135, EGFR 64 05/04/2016 Creatinine 0.93, BUN 21, Potassium 3.0, Sodium 133, EGFR 75 05/03/2016 Creatinine 0.93, BUN 24, Potassium 3.5, Sodium 134, EGFR 75 03/11/2016 Creatinine 0.98, BUN 17, Potassium 2.9, Sodium 136 01/18/2016 Creatinine 1.11, BUN 19, Potassium 4.5, Sodium 134, EGFR 52->60 01/17/2016 Creatinine 0.90, BUN 13, Potassium 5.1, Sodium 136, EGFR 68-78  01/16/2016 Creatinine 1.02, BUN 15, Potassium 3.4, Sodium 137, EGFR 58-67  01/15/2016 Creatinine 1.09, BUN 10, Potassium 3.0, Sodium 138, EGFR 54-62, BNP 88  08/17/2017Creatinine 1.12, BUN 27, Potassium 3.6, Sodium 134, EGFR 50-58 10/03/2015 Creatinine 1.02, BUN 20, Potassium 3.4, Sodium 136, EGFR 56->60  05/26/2015 Creatinine 1.01, BUN 12, Potassium 4.2, Sodium 137  Recommendations:   No changes.   Encouraged to call for fluid symptoms.  Follow-up plan: ICM clinic phone appointment on 10/27/2016.  Office appointment scheduled 10/10/2016 with Dr. Harl Bowie..  Copy of ICM check sent to device physician.   3 month ICM trend: 09/26/2016   1 Year ICM trend:      Rosalene Billings, RN 09/26/2016 8:53 AM

## 2016-10-10 ENCOUNTER — Ambulatory Visit (INDEPENDENT_AMBULATORY_CARE_PROVIDER_SITE_OTHER): Payer: Medicare Other | Admitting: Cardiology

## 2016-10-10 ENCOUNTER — Encounter: Payer: Self-pay | Admitting: Cardiology

## 2016-10-10 VITALS — BP 140/70 | HR 72 | Ht 60.0 in | Wt 196.6 lb

## 2016-10-10 DIAGNOSIS — I2510 Atherosclerotic heart disease of native coronary artery without angina pectoris: Secondary | ICD-10-CM

## 2016-10-10 DIAGNOSIS — I5022 Chronic systolic (congestive) heart failure: Secondary | ICD-10-CM | POA: Diagnosis not present

## 2016-10-10 DIAGNOSIS — R7309 Other abnormal glucose: Secondary | ICD-10-CM

## 2016-10-10 DIAGNOSIS — E782 Mixed hyperlipidemia: Secondary | ICD-10-CM

## 2016-10-10 DIAGNOSIS — I255 Ischemic cardiomyopathy: Secondary | ICD-10-CM

## 2016-10-10 NOTE — Progress Notes (Signed)
Clinical Summary Becky [name] is a 66 y.o.female seen today for follow up of the following medical problems.  1. ICM/Chronic systolic heart failure - history of multiple PCIs as described below, mainly in [place].  - 04/2013 LVEF 30-35%. Repeat echo after BiV AICD 05/2014 shows LVEF 68%, grade I diastolic dysfunction.  - 09/2015 echo LVEF 55-60%, no WMAs, grade I diastolic dysfunction - 04/2120 echo [place]: LVEF 65-70%, moderate LVH.  - medical therapy has been limited by orthostatic symtpoms -she has been on long term plavix due to her extensive stent burden       - no recent SOB. Occasional LE edema, improved w/ lasix.  No recent chest pain.  - compliant with meds  2. OSA  - remains compliant with CPAP  3. Hyperlipidemia - muscle aches on multiple statins - tolerating pravastatin   4. HTN -she remains compliant with meds  5. COPD - followed bypulmonary in [place]  .  Past Medical History:  Diagnosis Date  . AICD (automatic cardioverter/defibrillator) present   . Anxiety   . Arthritis    "all my joints; neck, back, legs, arms, elbows"  (10/03/2015)  . Asthma   . CAD (coronary artery disease)    a. 5/08: s/p DES to PDA and DES to LAD; b. 1/09: s/p DES to pLAD; c. 1/11: s/p DES to LAD x 2, d. LHC (3/12 in [place] - EF 55%, mild ant HK, nl LM, LAD stents ok, oLAD 30, RCA stents ok;  e.  4/13:  s/p DES to RCA, f. 1/14: dLM 20-30, mOM3 20-30, mLAD 80 ISR => s/p 2.5x16 mm Promus Element DES; EF 45-50%   . CHF (congestive heart failure) ([place])   . Chronic bronchitis ([place])   . Chronic lower back pain    "into pelvis region" (10/03/2015)  . COPD (chronic obstructive pulmonary disease) ([place])   . Daily headache    "might be from sinus" (10/03/2015)  . Depression    hx (10/03/2015)  . Fibromyalgia   . GERD (gastroesophageal reflux disease)   . Heart murmur   . HLD (hyperlipidemia)   . Hypertension   . Ischemic cardiomyopathy    a.  Echo (11/14):  EF 25%   . LBBB (left bundle branch block)   . Myocardial infarction ([place]) 12/2012  . OSA on CPAP   . Pneumonia 1950s  . Syncope 11/14   LifeVest placed  . Upper back pain, chronic    "into my shoulders; fibromyalgia works here" (10/03/2015)     Allergies  Allergen Reactions  . Tape Rash and Other (See Comments)    Severe Reaction (latex tape): Burned hand, Redness-took days to clear up (03/02/12)  . Codeine Nausea And Vomiting  . Prozac [Fluoxetine Hcl] Other (See Comments)    Mean/violent thoughts  . Fosamax [Alendronate Sodium] Nausea Only and Other (See Comments)    Stomach pain  . Keflex [Cephalexin] Nausea Only  . Lipitor [Atorvastatin] Other (See Comments)    Leg pain     Current Outpatient Prescriptions  Medication Sig Dispense Refill  . acetaminophen-codeine (TYLENOL #3) 300-30 MG tablet Take 1 tablet by mouth every 6 (six) hours as needed for moderate pain.   2  . Aspirin-Salicylamide-Caffeine (BC HEADACHE PO) Take 1 each by mouth daily.    . benzonatate (TESSALON) 100 MG capsule Take 2 capsules by mouth 3 (three) times daily as needed.  0  . budesonide (PULMICORT) 0.5 MG/2ML nebulizer solution Take 0.5 mg by nebulization 2 (two)  times daily.    . calcium carbonate (CALCIUM 600) 1500 (600 Ca) MG TABS tablet Take 600 mg of elemental calcium by mouth daily with breakfast.    . Cholecalciferol (VITAMIN D) 2000 UNITS CAPS Take 2,000 Units by mouth daily.    . clopidogrel (PLAVIX) 75 MG tablet Take 75 mg by mouth daily.     Marland Kitchen Dextromethorphan-Guaifenesin (MUCINEX DM) 30-600 MG TB12 Take 1 tablet by mouth 2 (two) times daily as needed.  0  . escitalopram (LEXAPRO) 20 MG tablet Take 20 mg by mouth daily.     . fluticasone (FLONASE) 50 MCG/ACT nasal spray Place 2 sprays into both nostrils daily.  7  . formoterol (PERFOROMIST) 20 MCG/2ML nebulizer solution Take 20 mcg by nebulization 2 (two) times daily.    . furosemide (LASIX) 40 MG tablet TAKE 1 AND 1/2 TABLET  BY MOUTH DAILY. MAY TAKE EXTRA 1 AND 1/2 TABLET AS NEEDED 90 tablet 1  . furosemide (LASIX) 40 MG tablet TAKE 1 AND 1/2 TABLET BY MOUTH DAILY. MAY TAKE EXTRA 1 AND 1/2 TABLET AS NEEDED 90 tablet 6  . furosemide (LASIX) 40 MG tablet TAKE 1 AND 1/2 TABLET BY MOUTH DAILY. MAY TAKE EXTRA 1 AND 1/2 TABLET AS NEEDED 90 tablet 0  . ipratropium-albuterol (DUONEB) 0.5-2.5 (3) MG/3ML SOLN Take 3 mLs by nebulization every 6 (six) hours as needed (shortness of breath).    . lactulose (CHRONULAC) 10 GM/15ML solution Take 10 g by mouth 2 (two) times daily as needed for mild constipation.    . magnesium oxide (MAG-OX) 400 MG tablet Take 400 mg by mouth daily.    . meclizine (ANTIVERT) 25 MG tablet Take 1 tablet by mouth 2 (two) times daily as needed.  3  . metoprolol succinate (TOPROL-XL) 100 MG 24 hr tablet TAKE 1 TABLET(100 MG) BY MOUTH DAILY 90 tablet 0  . montelukast (SINGULAIR) 10 MG tablet Take 10 mg by mouth every morning.     . nitroGLYCERIN (NITROSTAT) 0.4 MG SL tablet Place 1 tablet (0.4 mg total) under the tongue every 5 (five) minutes x 3 doses as needed. If no relief after 3rd dose, proceed to ED 25 tablet 5  . pantoprazole (PROTONIX) 40 MG tablet TAKE 1 TABLET BY MOUTH DAILY 30 tablet 6  . potassium chloride SA (K-DUR,KLOR-CON) 20 MEQ tablet Take 1 tablet (20 mEq total) by mouth every other day. 45 tablet 3  . pravastatin (PRAVACHOL) 40 MG tablet Take 40 mg by mouth daily.    Marland Kitchen rOPINIRole (REQUIP) 0.5 MG tablet Take 0.5 mg by mouth at bedtime.     Marland Kitchen spironolactone (ALDACTONE) 25 MG tablet TAKE 1/2 TABLET BY MOUTH DAILY 15 tablet 3   No current facility-administered medications for this visit.      Past Surgical History:  Procedure Laterality Date  . BI-VENTRICULAR IMPLANTABLE CARDIOVERTER DEFIBRILLATOR N/A 06/02/2013   Procedure: BI-VENTRICULAR IMPLANTABLE CARDIOVERTER DEFIBRILLATOR  (CRT-D);  Surgeon: Coralyn Mark, MD;  Location: [facility] CATH LAB;  Service: Cardiovascular;  Laterality: N/A;  .  BI-VENTRICULAR IMPLANTABLE CARDIOVERTER DEFIBRILLATOR  (CRT-D)  06/02/2013   MDT VivaQuad CRTD implanted by Dr Rayann Heman for ICM, CHF  . CARDIAC CATHETERIZATION  12/2012   S/P MI  . CORONARY ANGIOPLASTY WITH STENT PLACEMENT  2007; 2009; 2011   "?2 +1 +1 "  . CORONARY ANGIOPLASTY WITH STENT PLACEMENT  02/2012  . FRACTURE SURGERY    . HYSTEROSCOPY W/ ENDOMETRIAL ABLATION  ~ 1998  . LEAD REVISION  11-08-13   RA and  LV lead revision by Dr Rayann Heman  . LEAD REVISION Left 11/08/2013   Procedure: LEAD REVISION;  Surgeon: Coralyn Mark, MD;  Location: [facility] CATH LAB;  Service: Cardiovascular;  Laterality: Left;  . LEFT HEART CATHETERIZATION WITH CORONARY ANGIOGRAM N/A 01/03/2013   Procedure: LEFT HEART CATHETERIZATION WITH CORONARY ANGIOGRAM;  Surgeon: Burnell Blanks, MD;  Location: [facility] CATH LAB;  Service: Cardiovascular;  Laterality: N/A;  . ORIF ANKLE FRACTURE Right 10/21/2009  . TUBAL LIGATION  1989     Allergies  Allergen Reactions  . Tape Rash and Other (See Comments)    Severe Reaction (latex tape): Burned hand, Redness-took days to clear up (03/02/12)  . Codeine Nausea And Vomiting  . Prozac [Fluoxetine Hcl] Other (See Comments)    Mean/violent thoughts  . Fosamax [Alendronate Sodium] Nausea Only and Other (See Comments)    Stomach pain  . Keflex [Cephalexin] Nausea Only  . Lipitor [Atorvastatin] Other (See Comments)    Leg pain      Family History  Problem Relation Age of Onset  . Breast cancer Mother   . Heart failure Brother   . Breast cancer Sister   . Breast cancer Sister   . Uterine cancer Sister      Social History Ms. Bena reports that she has been smoking Cigarettes.  She started smoking about 25 years ago. She has a 10.00 pack-year smoking history. She has never used smokeless tobacco. Ms. Date reports that she drinks alcohol.   Review of Systems CONSTITUTIONAL: No weight loss, fever, chills, weakness or fatigue.  HEENT: Eyes: No visual loss, blurred  vision, double vision or yellow sclerae.No hearing loss, sneezing, congestion, runny nose or sore throat.  SKIN: No rash or itching.  CARDIOVASCULAR: per hpi RESPIRATORY: per hpi GASTROINTESTINAL: No anorexia, nausea, vomiting or diarrhea. No abdominal pain or blood.  GENITOURINARY: No burning on urination, no polyuria NEUROLOGICAL: No headache, dizziness, syncope, paralysis, ataxia, numbness or tingling in the extremities. No change in bowel or bladder control.  MUSCULOSKELETAL: No muscle, back pain, joint pain or stiffness.  LYMPHATICS: No enlarged nodes. No history of splenectomy.  PSYCHIATRIC: No history of depression or anxiety.  ENDOCRINOLOGIC: No reports of sweating, cold or heat intolerance. No polyuria or polydipsia.  Marland Kitchen   Physical Examination Vitals:   10/10/16 1026  BP: 140/70  Pulse: 72  SpO2: 93%   Vitals:   10/10/16 1026  Weight: 196 lb 9.6 oz (89.2 kg)  Height: 5' (1.524 m)    Gen: resting comfortably, no acute distress HEENT: no scleral icterus, pupils equal round and reactive, no palptable cervical adenopathy,  CV: RRR, no m/r/g, no jvd Resp: Clear to auscultation bilaterally GI: abdomen is soft, non-tender, non-distended, normal bowel sounds, no hepatosplenomegaly MSK: extremities are warm, no edema.  Skin: warm, no rash Neuro:  no focal deficits Psych: appropriate affect   Diagnostic Studies 04/2010 Cath [place], [place]: LVEF 55%, mild anterior hypokinesis. LM normal, LAD with stents in prox and mid portion widely patent, LAD ostial 30%. Diags with luminal irregs. LCX with luminial irregs. RCA with distal patent stents  Stent cards May 2008 DES to PDA, DES to LAD  Jan 2009 DES to prox LAD  Mar 05 2009 DES to LAD x2  05/2011 DES to RCA  Jan 2014 stent done [place], [place]  03/2010 Echo: LVEF 40%, hypokinesis of the anteroseptum.   01/02/13 Cath Hemodynamic Findings: Central aortic pressure: 147/79  Left ventricular pressure: 130/22/26   Angiographic Findings: Left main: 30% distal stenosis.  It appears that the distal left main has a stent that continues into the LAD.  Left Anterior Descending Artery: Large vessel that courses to the apex. The entire proximal and mid LAD is stented. There is mild stent restenosis in the proximal segment. The mid stented segment has diffuse 30% stent restenosis. The distal vessel becomes small in caliber and has mild diffuse plaque. There is a moderate caliber first diagonal branch with mild plaque disease.  Circumflex Artery: Moderate caliber non-dominant vessel with three moderate caliber obtuse marginal branches. No obstructive disease.  Right Coronary Artery: Large dominant vessel with 30% proximal stenosis, heavily calcified mid vessel with patent stent in the mid vessel (no significant restenosis). The distal vessel has diffuse 40-50% stenosis. The posterolateral branch and PDA are moderate caliber, patent vessels with mild plaque disease.  Left Ventricular Angiogram: LVEF=25-30%. Hypokinesis of the antero-apical wall.  Impression:  1. 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 Elevated troponin following syncopal event. No culprit lesions seen.  3. Moderate to severe LV systolic dysfunction.  Recommendations: Continue medical management of CAD. Workup for syncope is underway. Carotid dopplers today. Will check d-dimer as PE is a possibility. She could also have had an arrythmia with LV dysfunction.  Complications: None. The patient tolerated the procedure well.  01/03/13 Echo - LVEF 25-30%, akinesis of the anteroseptal and apical myocardium, grade I diastolic dysfunction,  35/32/99 Carotid US - The vertebral arteries appear patent with antegrade flow. - Findings consistent with1- 39 percent stenosis,high end of scale,involving the right internal carotid artery. - Findings consistent with 40 - 59 percent stenosis, low end of scale,involving the left  internal carotid artery. - ICA/CCA ratio. right =1.72. left = 1.52 Other specific details can be found in the table(s) above. Prepared and Electronically Authenticated by 12/13/12 Event monitor No symptoms reported, sinus rhythm with conduction delay, occasional PVCs.  04/2013 Echo Study Conclusions  - Left ventricle: The cavity size was normal. Wall thickness was increased in a pattern of mild LVH with moderate basal septal hypertrophy. Systolic function was moderately to severely reduced. The estimated ejection fraction was in the range of 30% to 35%. There is akinesis to dyskinesis of the mid-distal anteroseptal and apical myocardium. There is akinesis of the distalinferoseptal myocardium. Doppler parameters are consistent with abnormal left ventricular relaxation (grade 1 diastolic dysfunction). - Ventricular septum: Septal motion showed abnormal function and dyssynergy. - Aortic valve: Mildly calcified annulus. Probably trileaflet; mildly calcified leaflets. No significant regurgitation. - Mitral valve: Calcified annulus. Trivial regurgitation. - Left atrium: The atrium was mildly dilated. - Right atrium: Central venous pressure: 44m Hg (est). - Tricuspid valve: Trivial regurgitation. - Pulmonary arteries: PA peak pressure: 227mHg (S). - Pericardium, extracardiac: There was no pericardial effusion. Impressions:  - [place] moderate basal septal hypertrophy, LVEF 30-35% with wall motion abnormalities as outlined, grade 1 diastolic dysfunction. Mild left atrial enlagement. MAC with trivial mitral regurgitation. MIldlysclerotic aortic valve. Trivial tricuspid regurgitation with normal PASP 20 mmHg. 05/2014 echo Study Conclusions  - Left ventricle: Technically limited study. The cavity size was normal. Wall thickness was increased in a pattern of moderate LVH. The estimated ejection fraction was 55%. Doppler parameters are consistent with abnormal left  ventricular relaxation (grade 1 diastolic dysfunction). - Aortic valve: Sclerosis without stenosis. - Right ventricle: The cavity size was normal. Pacer wire or catheter noted in right ventricle. Systolic function was normal. - Impressions: Since the study in 2015, there is definite improvement in  LV function.  Impressions:  - Since the study in 2015, there is definite improvement in LV function.   09/2015 Nuclear Stress  There was no ST segment deviation noted during stress.  No T wave inversion was noted during stress.  Defect 1: There is a medium defect of mild severity present in the basal inferior and mid inferior location. This defect is likely a result of bowel loop attenuation artifact. No significant ischemia identified.  This is a low risk study. No significant ischemia identified.  Nuclear stress EF: 54%. Mid to apical septal wall hypokinesis noted   09/2015 echo Study Conclusions  - Left ventricle: The cavity size was normal. Systolic function was normal. The estimated ejection fraction was in the range of 55% to 60%. Wall motion was normal; there were no regional wall motion abnormalities. Doppler parameters are consistent with abnormal left ventricular relaxation (grade 1 diastolic dysfunction). Doppler parameters are consistent with indeterminate ventricular filling pressure. - Aortic valve: Transvalvular velocity was within the normal range. There was no stenosis. There was no regurgitation. - Mitral valve: Transvalvular velocity was within the normal range. There was no evidence for stenosis. There was no regurgitation. - Left atrium: The atrium was moderately dilated. - Right ventricle: The cavity size was normal. Wall thickness was normal. Systolic function was normal. - Tricuspid valve: There was no regurgitation.     Assessment and Plan  1. ICM/Chronic systolic heart failure  - previous LVEF 30-35%. After BiV AICD  repeat echo with normalized LVEF - medical therapy has been limited due to orthstatic symptoms - indefinite plavix due to large stent burden  - no recent symptoms, she will continue current meds   2. OSA - she will continue CPAP  3. Hyperlipidemia - difficulty tolerating statins in the past.  - continue pravastatin.   4. HTN -her bp is at goal, she continue current meds   Repeat annual labs    Arnoldo Lenis, M.D.

## 2016-10-10 NOTE — Patient Instructions (Signed)
Your physician wants you to follow-up in: 6 MONTHS WITH DR [facility] You will receive a reminder letter in the mail two months in advance. If you don't receive a letter, please call our office to schedule the follow-up appointment.  Your physician recommends that you continue on your current medications as directed. Please refer to the Current Medication list given to you today.  Your physician recommends that you return for lab work PLEASE FAST 6 HOURS PRIOR TO LAB WORK - BMP/CBC/TSH/MG/LIPIDS/HGBA1C   Thank you for choosing [facility]!!

## 2016-10-16 ENCOUNTER — Telehealth: Payer: Self-pay | Admitting: *Deleted

## 2016-10-16 ENCOUNTER — Encounter: Payer: Self-pay | Admitting: *Deleted

## 2016-10-16 NOTE — Telephone Encounter (Signed)
Pt aware and will mail     To whom it may concern,   I have been asked to write a letter on behalf of Mr Becky [name]. Mr Cantera's wife, Mrs Becky [name], is a patient in our cardiology clinic. She has serious medical conditions that require multiple medications, frequent clinic visits, and overall close monitoring at home. Mr Bertilla Shattles has been heavily involved with his wifes care, and has helped her tremendously to improve from a medical standpoint. [name] reports to me that it will be truly difficult for her to meet her healthcare obligations without the assistance of her husband.    Dina Rich MD

## 2016-10-16 NOTE — Telephone Encounter (Signed)
Pt requesting a letter from Dr Wyline Mood that husband needs to be at home for patients well being (cook/clean) says husband is due to go to court and could face jail time - pt says she mentioned this at 8/24 OV but don't see this in dictation

## 2016-10-27 ENCOUNTER — Ambulatory Visit (INDEPENDENT_AMBULATORY_CARE_PROVIDER_SITE_OTHER): Payer: Medicare Other

## 2016-10-27 ENCOUNTER — Telehealth: Payer: Self-pay

## 2016-10-27 DIAGNOSIS — Z95810 Presence of automatic (implantable) cardiac defibrillator: Secondary | ICD-10-CM

## 2016-10-27 DIAGNOSIS — I5022 Chronic systolic (congestive) heart failure: Secondary | ICD-10-CM

## 2016-10-27 NOTE — Telephone Encounter (Signed)
Remote ICM transmission received.  Attempted call to patient and left message to return call regarding transmission. 

## 2016-10-27 NOTE — Progress Notes (Signed)
EPIC Encounter for ICM Monitoring  Patient Name: Becky [name] is a 66 y.o. female Date: 10/27/2016 Primary Care Physican: Joseph Art, MD Primary Cardiologist:Branch Electrophysiologist: Allred Dry Weight:193lbs Bi-V Pacing: 98%      Attempted call to patient and unable to reach.  Left message to return call regarding transmission.  Transmission reviewed.    Thoracic impedance abnormal suggesting fluid accumulation since 10/11/2016.  Prescribed dosage: Furosemide 40 mg 1.5 tablets (60 mg total) daily and may take an extra 60 mg as needed. Potassium 20 mEq 1 tablet every other day.   Labs:  05/05/2016 Creatinine 1.05, BUN 19, Potassium 3.6, Sodium 135, EGFR 64 05/04/2016 Creatinine 0.93, BUN 21, Potassium 3.0, Sodium 133, EGFR 75 05/03/2016 Creatinine 0.93, BUN 24, Potassium 3.5, Sodium 134, EGFR 75 03/11/2016 Creatinine 0.98, BUN 17, Potassium 2.9, Sodium 136 01/18/2016 Creatinine 1.11, BUN 19, Potassium 4.5, Sodium 134, EGFR 52->60 01/17/2016 Creatinine 0.90, BUN 13, Potassium 5.1, Sodium 136, EGFR 68-78  01/16/2016 Creatinine 1.02, BUN 15, Potassium 3.4, Sodium 137, EGFR 58-67  01/15/2016 Creatinine 1.09, BUN 10, Potassium 3.0, Sodium 138, EGFR 54-62, BNP 88  08/17/2017Creatinine 1.12, BUN 27, Potassium 3.6, Sodium 134, EGFR 50-58 10/03/2015 Creatinine 1.02, BUN 20, Potassium 3.4, Sodium 136, EGFR 56->60  05/26/2015 Creatinine 1.01, BUN 12, Potassium 4.2, Sodium 137  Recommendations: NONE - Unable to reach patient   Follow-up plan: ICM clinic phone appointment on 11/06/2016 recheck fluid levels (manual send).  Copy of ICM check sent to Dr. Harl Bowie and Dr. Rayann Heman.   3 month ICM trend: 10/27/2016   1 Year ICM trend:      Rosalene Billings, RN 10/27/2016 12:33 PM

## 2016-10-28 NOTE — Progress Notes (Signed)
Patient returned call.  She reported she has had weight gain and is aware of the fluid accumulation.  She took extra Furosemide today and will do so for a couple of days.  Will recheck fluid levels on 10/31/2016.

## 2016-10-31 ENCOUNTER — Ambulatory Visit (INDEPENDENT_AMBULATORY_CARE_PROVIDER_SITE_OTHER): Payer: Self-pay

## 2016-10-31 DIAGNOSIS — Z95810 Presence of automatic (implantable) cardiac defibrillator: Secondary | ICD-10-CM

## 2016-10-31 DIAGNOSIS — I5022 Chronic systolic (congestive) heart failure: Secondary | ICD-10-CM

## 2016-10-31 NOTE — Progress Notes (Signed)
EPIC Encounter for ICM Monitoring  Patient Name: Becky [name] is a 66 y.o. female Date: 10/31/2016 Primary Care Physican: Joseph Art, MD Primary Cardiologist:Branch Electrophysiologist: Allred Dry Weight:191lbs Bi-V Pacing: 98.2%          Heart Failure questions reviewed, pt asymptomatic and weight loss of 2 lbs.   Thoracic impedance above baseline correlating with patient taking extra Furosemide.  Prescribed dosage: Furosemide 40 mg 1.5 tablets (60 mg total) daily and may take an extra 60 mg as needed. Potassium 20 mEq 1 tablet every other day.   Labs:  05/05/2016 Creatinine 1.05, BUN 19, Potassium 3.6, Sodium 135, EGFR 64 05/04/2016 Creatinine 0.93, BUN 21, Potassium 3.0, Sodium 133, EGFR 75 05/03/2016 Creatinine 0.93, BUN 24, Potassium 3.5, Sodium 134, EGFR 75 03/11/2016 Creatinine 0.98, BUN 17, Potassium 2.9, Sodium 136 01/18/2016 Creatinine 1.11, BUN 19, Potassium 4.5, Sodium 134, EGFR 52->60 01/17/2016 Creatinine 0.90, BUN 13, Potassium 5.1, Sodium 136, EGFR 68-78  01/16/2016 Creatinine 1.02, BUN 15, Potassium 3.4, Sodium 137, EGFR 58-67  01/15/2016 Creatinine 1.09, BUN 10, Potassium 3.0, Sodium 138, EGFR 54-62, BNP 88  08/17/2017Creatinine 1.12, BUN 27, Potassium 3.6, Sodium 134, EGFR 50-58 10/03/2015 Creatinine 1.02, BUN 20, Potassium 3.4, Sodium 136, EGFR 56->60  05/26/2015 Creatinine 1.01, BUN 12, Potassium 4.2, Sodium 137  Recommendations: Advised her to return to prescribed dosage of Furosemide. Encouraged to call for fluid symptoms.  Follow-up plan: ICM clinic phone appointment on 11/27/2016.    Copy of ICM check sent to Dr. Rayann Heman.   3 month ICM trend: 10/31/2016   1 Year ICM trend:      Rosalene Billings, RN 10/31/2016 8:44 AM

## 2016-11-21 ENCOUNTER — Other Ambulatory Visit: Payer: Self-pay | Admitting: Cardiology

## 2016-11-27 ENCOUNTER — Telehealth: Payer: Self-pay | Admitting: Cardiology

## 2016-11-27 ENCOUNTER — Encounter: Payer: Medicare Other | Admitting: *Deleted

## 2016-11-27 NOTE — Telephone Encounter (Signed)
LMOVM reminding pt to send remote transmission.   

## 2016-11-28 ENCOUNTER — Encounter: Payer: Self-pay | Admitting: Cardiology

## 2016-12-01 ENCOUNTER — Ambulatory Visit (INDEPENDENT_AMBULATORY_CARE_PROVIDER_SITE_OTHER): Payer: Medicare Other

## 2016-12-01 DIAGNOSIS — I5022 Chronic systolic (congestive) heart failure: Secondary | ICD-10-CM | POA: Diagnosis not present

## 2016-12-01 DIAGNOSIS — Z95810 Presence of automatic (implantable) cardiac defibrillator: Secondary | ICD-10-CM | POA: Diagnosis not present

## 2016-12-01 NOTE — Progress Notes (Signed)
EPIC Encounter for ICM Monitoring  Patient Name: Becky [name] is a 66 y.o. female Date: 12/01/2016 Primary Care Physican: Joseph Art, MD Primary Cardiologist:Branch Electrophysiologist: Allred Dry Weight:194lbs at MD office a month ago Bi-V Pacing: 98.2%      Heart Failure questions reviewed, pt asymptomatic.  Not weighing at home.    Thoracic impedance abnormal suggesting fluid accumulation since 10/1/20118.  Prescribed dosage: Furosemide 40 mg 1.5 tablets (60 mg total) daily and may take an extra 60 mg as needed. Potassium 20 mEq 1 tablet every other day.   Labs:  05/05/2016 Creatinine 1.05, BUN 19, Potassium 3.6, Sodium 135, EGFR 64 05/04/2016 Creatinine 0.93, BUN 21, Potassium 3.0, Sodium 133, EGFR 75 05/03/2016 Creatinine 0.93, BUN 24, Potassium 3.5, Sodium 134, EGFR 75 03/11/2016 Creatinine 0.98, BUN 17, Potassium 2.9, Sodium 136 01/18/2016 Creatinine 1.11, BUN 19, Potassium 4.5, Sodium 134, EGFR 52->60 01/17/2016 Creatinine 0.90, BUN 13, Potassium 5.1, Sodium 136, EGFR 68-78  01/16/2016 Creatinine 1.02, BUN 15, Potassium 3.4, Sodium 137, EGFR 58-67  01/15/2016 Creatinine 1.09, BUN 10, Potassium 3.0, Sodium 138, EGFR 54-62, BNP 88  08/17/2017Creatinine 1.12, BUN 27, Potassium 3.6, Sodium 134, EGFR 50-58 10/03/2015 Creatinine 1.02, BUN 20, Potassium 3.4, Sodium 136, EGFR 56->60  05/26/2015 Creatinine 1.01, BUN 12, Potassium 4.2, Sodium 137  Recommendations:  Advised to increase Furosemide to 60 mg twice a day x 3 days and then return to prescribed dosage.  Advised to take extra Potassium tablet when taking extra Furosemide.   Follow-up plan: ICM clinic phone appointment on 12/09/2016 to recheck fluid levels.  Copy of ICM check sent to Dr. Rayann Heman and Dr. Harl Bowie.   3 month ICM trend: 12/01/2016   1 Year ICM trend:      Rosalene Billings, RN 12/01/2016 9:46 AM

## 2016-12-09 ENCOUNTER — Ambulatory Visit (INDEPENDENT_AMBULATORY_CARE_PROVIDER_SITE_OTHER): Payer: Self-pay

## 2016-12-09 ENCOUNTER — Ambulatory Visit (INDEPENDENT_AMBULATORY_CARE_PROVIDER_SITE_OTHER): Payer: Medicare Other | Admitting: *Deleted

## 2016-12-09 DIAGNOSIS — I255 Ischemic cardiomyopathy: Secondary | ICD-10-CM

## 2016-12-09 DIAGNOSIS — Z95810 Presence of automatic (implantable) cardiac defibrillator: Secondary | ICD-10-CM

## 2016-12-09 DIAGNOSIS — I5022 Chronic systolic (congestive) heart failure: Secondary | ICD-10-CM

## 2016-12-09 NOTE — Progress Notes (Signed)
EPIC Encounter for ICM Monitoring  Patient Name: Becky [name] is a 66 y.o. female Date: 12/09/2016 Primary Care Physican: Joseph Art, MD Primary Cardiologist:Branch Electrophysiologist: Allred Dry Weight: 194lbs at MD office a month ago Bi-V Pacing: 98.2%  Heart Failure questions reviewed, pt asymptomatic.   Thoracic impedance returned to normal after taking 3 days extra Furosemide.   Prescribed dosage: Furosemide 40 mg 1.5 tablets (60 mg total) daily and may take an extra 60 mg as needed. Potassium 20 mEq 1 tablet every other day.   Labs:  05/05/2016 Creatinine 1.05, BUN 19, Potassium 3.6, Sodium 135, EGFR 64 05/04/2016 Creatinine 0.93, BUN 21, Potassium 3.0, Sodium 133, EGFR 75 05/03/2016 Creatinine 0.93, BUN 24, Potassium 3.5, Sodium 134, EGFR 75 03/11/2016 Creatinine 0.98, BUN 17, Potassium 2.9, Sodium 136 01/18/2016 Creatinine 1.11, BUN 19, Potassium 4.5, Sodium 134, EGFR 52->60 01/17/2016 Creatinine 0.90, BUN 13, Potassium 5.1, Sodium 136, EGFR 68-78  01/16/2016 Creatinine 1.02, BUN 15, Potassium 3.4, Sodium 137, EGFR 58-67  01/15/2016 Creatinine 1.09, BUN 10, Potassium 3.0, Sodium 138, EGFR 54-62, BNP 88  08/17/2017Creatinine 1.12, BUN 27, Potassium 3.6, Sodium 134, EGFR 50-58 10/03/2015 Creatinine 1.02, BUN 20, Potassium 3.4, Sodium 136, EGFR 56->60  05/26/2015 Creatinine 1.01, BUN 12, Potassium 4.2, Sodium 137  Recommendations: No changes.   Encouraged to call for fluid symptoms.  Follow-up plan: ICM clinic phone appointment on 01/01/2017.   Copy of ICM check sent to Dr. Rayann Heman.   3 month ICM trend: 12/09/2016   1 Year ICM trend:      Rosalene Billings, RN 12/09/2016 10:24 AM

## 2016-12-10 NOTE — Progress Notes (Signed)
Remote ICD transmission.   

## 2016-12-11 LAB — CUP PACEART REMOTE DEVICE CHECK
Battery Voltage: 2.93 V
Brady Statistic AP VP Percent: 18.95 %
Brady Statistic AS VP Percent: 79.49 %
Brady Statistic AS VS Percent: 1.21 %
HIGH POWER IMPEDANCE MEASURED VALUE: 91 Ohm
Implantable Lead Implant Date: 20150922
Implantable Lead Location: 753859
Implantable Lead Location: 753860
Implantable Lead Model: 4598
Implantable Lead Model: 5076
Implantable Lead Model: 6935
Implantable Pulse Generator Implant Date: 20150416
Lead Channel Impedance Value: 323 Ohm
Lead Channel Impedance Value: 342 Ohm
Lead Channel Impedance Value: 513 Ohm
Lead Channel Impedance Value: 589 Ohm
Lead Channel Impedance Value: 646 Ohm
Lead Channel Impedance Value: 665 Ohm
Lead Channel Pacing Threshold Amplitude: 0.75 V
Lead Channel Pacing Threshold Amplitude: 2.75 V
Lead Channel Pacing Threshold Pulse Width: 0.8 ms
Lead Channel Sensing Intrinsic Amplitude: 3.875 mV
Lead Channel Sensing Intrinsic Amplitude: 3.875 mV
Lead Channel Setting Pacing Amplitude: 2.75 V
Lead Channel Setting Pacing Pulse Width: 0.4 ms
Lead Channel Setting Sensing Sensitivity: 0.3 mV
MDC IDC LEAD IMPLANT DT: 20150416
MDC IDC LEAD IMPLANT DT: 20150922
MDC IDC LEAD LOCATION: 753858
MDC IDC MSMT BATTERY REMAINING LONGEVITY: 21 mo
MDC IDC MSMT LEADCHNL LV IMPEDANCE VALUE: 304 Ohm
MDC IDC MSMT LEADCHNL LV IMPEDANCE VALUE: 323 Ohm
MDC IDC MSMT LEADCHNL LV IMPEDANCE VALUE: 323 Ohm
MDC IDC MSMT LEADCHNL LV IMPEDANCE VALUE: 494 Ohm
MDC IDC MSMT LEADCHNL LV IMPEDANCE VALUE: 513 Ohm
MDC IDC MSMT LEADCHNL RA IMPEDANCE VALUE: 494 Ohm
MDC IDC MSMT LEADCHNL RA PACING THRESHOLD PULSEWIDTH: 0.4 ms
MDC IDC MSMT LEADCHNL RV IMPEDANCE VALUE: 399 Ohm
MDC IDC MSMT LEADCHNL RV PACING THRESHOLD AMPLITUDE: 1 V
MDC IDC MSMT LEADCHNL RV PACING THRESHOLD PULSEWIDTH: 0.4 ms
MDC IDC MSMT LEADCHNL RV SENSING INTR AMPL: 8.125 mV
MDC IDC MSMT LEADCHNL RV SENSING INTR AMPL: 8.125 mV
MDC IDC SESS DTM: 20181023062723
MDC IDC SET LEADCHNL LV PACING PULSEWIDTH: 0.8 ms
MDC IDC SET LEADCHNL RA PACING AMPLITUDE: 2 V
MDC IDC SET LEADCHNL RV PACING AMPLITUDE: 2 V
MDC IDC STAT BRADY AP VS PERCENT: 0.35 %
MDC IDC STAT BRADY RA PERCENT PACED: 19.27 %
MDC IDC STAT BRADY RV PERCENT PACED: 9.23 %

## 2016-12-12 ENCOUNTER — Encounter: Payer: Self-pay | Admitting: Cardiology

## 2017-01-01 ENCOUNTER — Ambulatory Visit (INDEPENDENT_AMBULATORY_CARE_PROVIDER_SITE_OTHER): Payer: Medicare Other

## 2017-01-01 ENCOUNTER — Telehealth: Payer: Self-pay | Admitting: Cardiology

## 2017-01-01 DIAGNOSIS — Z95810 Presence of automatic (implantable) cardiac defibrillator: Secondary | ICD-10-CM

## 2017-01-01 DIAGNOSIS — I5022 Chronic systolic (congestive) heart failure: Secondary | ICD-10-CM | POA: Diagnosis not present

## 2017-01-01 NOTE — Telephone Encounter (Signed)
Spoke with pt and reminded pt of remote transmission that is due today. Pt verbalized understanding.   

## 2017-01-01 NOTE — Progress Notes (Signed)
EPIC Encounter for ICM Monitoring  Patient Name: Becky [name] is a 66 y.o. female Date: 01/01/2017 Primary Care Physican: Bedelia Person, MD Primary Cardiologist:Branch Electrophysiologist: Allred Dry Weight: 194lbs at MD office a month ago Bi-V Pacing: 98.2%       Heart Failure questions reviewed, pt symptomatic with chest congestion.   Thoracic impedance abnormal suggesting fluid accumulation since 12/25/2016.  Prescribed dosage: Furosemide 40 mg 1.5 tablets (60 mg total) daily and may take an extra 60 mg as needed. Potassium 20 mEq 1 tablet every other day.   Recommendations: Patient stated she will take extra 60 mg Furosemide for a couple of days  Follow-up plan: ICM clinic phone appointment on 01/15/2017 to recheck fluid levles.    Copy of ICM check sent to Dr. Wyline Mood and Dr. Johney Frame.   3 month ICM trend: 01/01/2017    1 Year ICM trend:       Karie Soda, RN 01/01/2017 3:46 PM

## 2017-01-15 ENCOUNTER — Ambulatory Visit (INDEPENDENT_AMBULATORY_CARE_PROVIDER_SITE_OTHER): Payer: Self-pay

## 2017-01-15 DIAGNOSIS — I5022 Chronic systolic (congestive) heart failure: Secondary | ICD-10-CM

## 2017-01-15 DIAGNOSIS — Z95810 Presence of automatic (implantable) cardiac defibrillator: Secondary | ICD-10-CM

## 2017-01-16 ENCOUNTER — Telehealth: Payer: Self-pay

## 2017-01-16 NOTE — Progress Notes (Signed)
EPIC Encounter for ICM Monitoring  Patient Name: Becky [name] is a 66 y.o. female Date: 01/16/2017 Primary Care Physican: Joseph Art, MD Primary Cardiologist:Branch Electrophysiologist: Allred Dry Weight: 194lbs at MD office a month ago Bi-V Pacing: 98.3%      Attempted call to patient and unable to reach.  Left detailed message regarding transmission.  Transmission reviewed.    Thoracic impedance returned to baseline on 01/06/2017 but has returned to slightly below baseline.  Prescribed dosage: Furosemide 40 mg 1.5 tablets (60 mg total) daily and may take an extra 60 mg as needed. Potassium 20 mEq 1 tablet every other day.   Labs:  05/05/2016 Creatinine 1.05, BUN 19, Potassium 3.6, Sodium 135, EGFR 64 05/04/2016 Creatinine 0.93, BUN 21, Potassium 3.0, Sodium 133, EGFR 75 05/03/2016 Creatinine 0.93, BUN 24, Potassium 3.5, Sodium 134, EGFR 75 03/11/2016 Creatinine 0.98, BUN 17, Potassium 2.9, Sodium 136 01/18/2016 Creatinine 1.11, BUN 19, Potassium 4.5, Sodium 134, EGFR 52->60 01/17/2016 Creatinine 0.90, BUN 13, Potassium 5.1, Sodium 136, EGFR 68-78  01/16/2016 Creatinine 1.02, BUN 15, Potassium 3.4, Sodium 137, EGFR 58-67  01/15/2016 Creatinine 1.09, BUN 10, Potassium 3.0, Sodium 138, EGFR 54-62, BNP 88  08/17/2017Creatinine 1.12, BUN 27, Potassium 3.6, Sodium 134, EGFR 50-58 10/03/2015 Creatinine 1.02, BUN 20, Potassium 3.4, Sodium 136, EGFR 56->60  05/26/2015 Creatinine 1.01, BUN 12, Potassium 4.2, Sodium 137  Recommendations:  Patient manages fluid symptoms by taking extra Furosemide as needed.   Encouraged to call for fluid symptoms.  Follow-up plan: ICM clinic phone appointment on 02/03/2017.  Office appointment scheduled 03/20/2017 with Dr. Rayann Heman.  Copy of ICM check sent to Dr. Rayann Heman and Dr. Harl Bowie.   3 month ICM trend: 01/15/2017    1 Year ICM trend:       Rosalene Billings, RN 01/16/2017 11:46 AM

## 2017-01-16 NOTE — Telephone Encounter (Signed)
Remote ICM transmission received.  Attempted call to patient and left detailed message per DPR regarding transmission and next ICM scheduled for 02/03/2017.  Advised to return call for any fluid symptoms or questions.

## 2017-01-23 ENCOUNTER — Other Ambulatory Visit: Payer: Self-pay | Admitting: Cardiology

## 2017-02-03 ENCOUNTER — Ambulatory Visit (INDEPENDENT_AMBULATORY_CARE_PROVIDER_SITE_OTHER): Payer: Medicare Other

## 2017-02-03 DIAGNOSIS — Z95810 Presence of automatic (implantable) cardiac defibrillator: Secondary | ICD-10-CM

## 2017-02-03 DIAGNOSIS — I5022 Chronic systolic (congestive) heart failure: Secondary | ICD-10-CM

## 2017-02-05 NOTE — Progress Notes (Signed)
EPIC Encounter for ICM Monitoring  Patient Name: Becky [name] is a 66 y.o. female Date: 02/05/2017 Primary Care Physican: Joseph Art, MD Primary Cardiologist:Branch Electrophysiologist: Allred Dry Weight: Previous weight 194lbs at MD office  Bi-V Pacing: 98.1%                                                        Transmission received.   Thoracic impedance normal.  Prescribed dosage: Furosemide 40 mg 1.5 tablets (60 mg total) daily and may take an extra 60 mg as needed. Potassium 20 mEq 1 tablet every other day.   Labs:  05/05/2016 Creatinine 1.05, BUN 19, Potassium 3.6, Sodium 135, EGFR 64 05/04/2016 Creatinine 0.93, BUN 21, Potassium 3.0, Sodium 133, EGFR 75 05/03/2016 Creatinine 0.93, BUN 24, Potassium 3.5, Sodium 134, EGFR 75 03/11/2016 Creatinine 0.98, BUN 17, Potassium 2.9, Sodium 136 01/18/2016 Creatinine 1.11, BUN 19, Potassium 4.5, Sodium 134, EGFR 52->60 01/17/2016 Creatinine 0.90, BUN 13, Potassium 5.1, Sodium 136, EGFR 68-78  01/16/2016 Creatinine 1.02, BUN 15, Potassium 3.4, Sodium 137, EGFR 58-67  01/15/2016 Creatinine 1.09, BUN 10, Potassium 3.0, Sodium 138, EGFR 54-62, BNP 88  08/17/2017Creatinine 1.12, BUN 27, Potassium 3.6, Sodium 134, EGFR 50-58 10/03/2015 Creatinine 1.02, BUN 20, Potassium 3.4, Sodium 136, EGFR 56->60  05/26/2015 Creatinine 1.01, BUN 12, Potassium 4.2, Sodium 137  Recommendations: None.  Follow-up plan: ICM clinic phone appointment on 03/10/2017.    Copy of ICM check sent to Dr. Rayann Heman.   3 month ICM trend: 02/03/2017    1 Year ICM trend:       Rosalene Billings, RN 02/05/2017 5:46 PM

## 2017-02-12 IMAGING — DX DG CHEST 2V
2 series · 2 of 2 positions shown · non-contrast
Comparison: 11/09/2013

CLINICAL DATA: Chest pain.  Congestive heart failure.

EXAM:
CHEST  2 VIEW

[w chest pa]
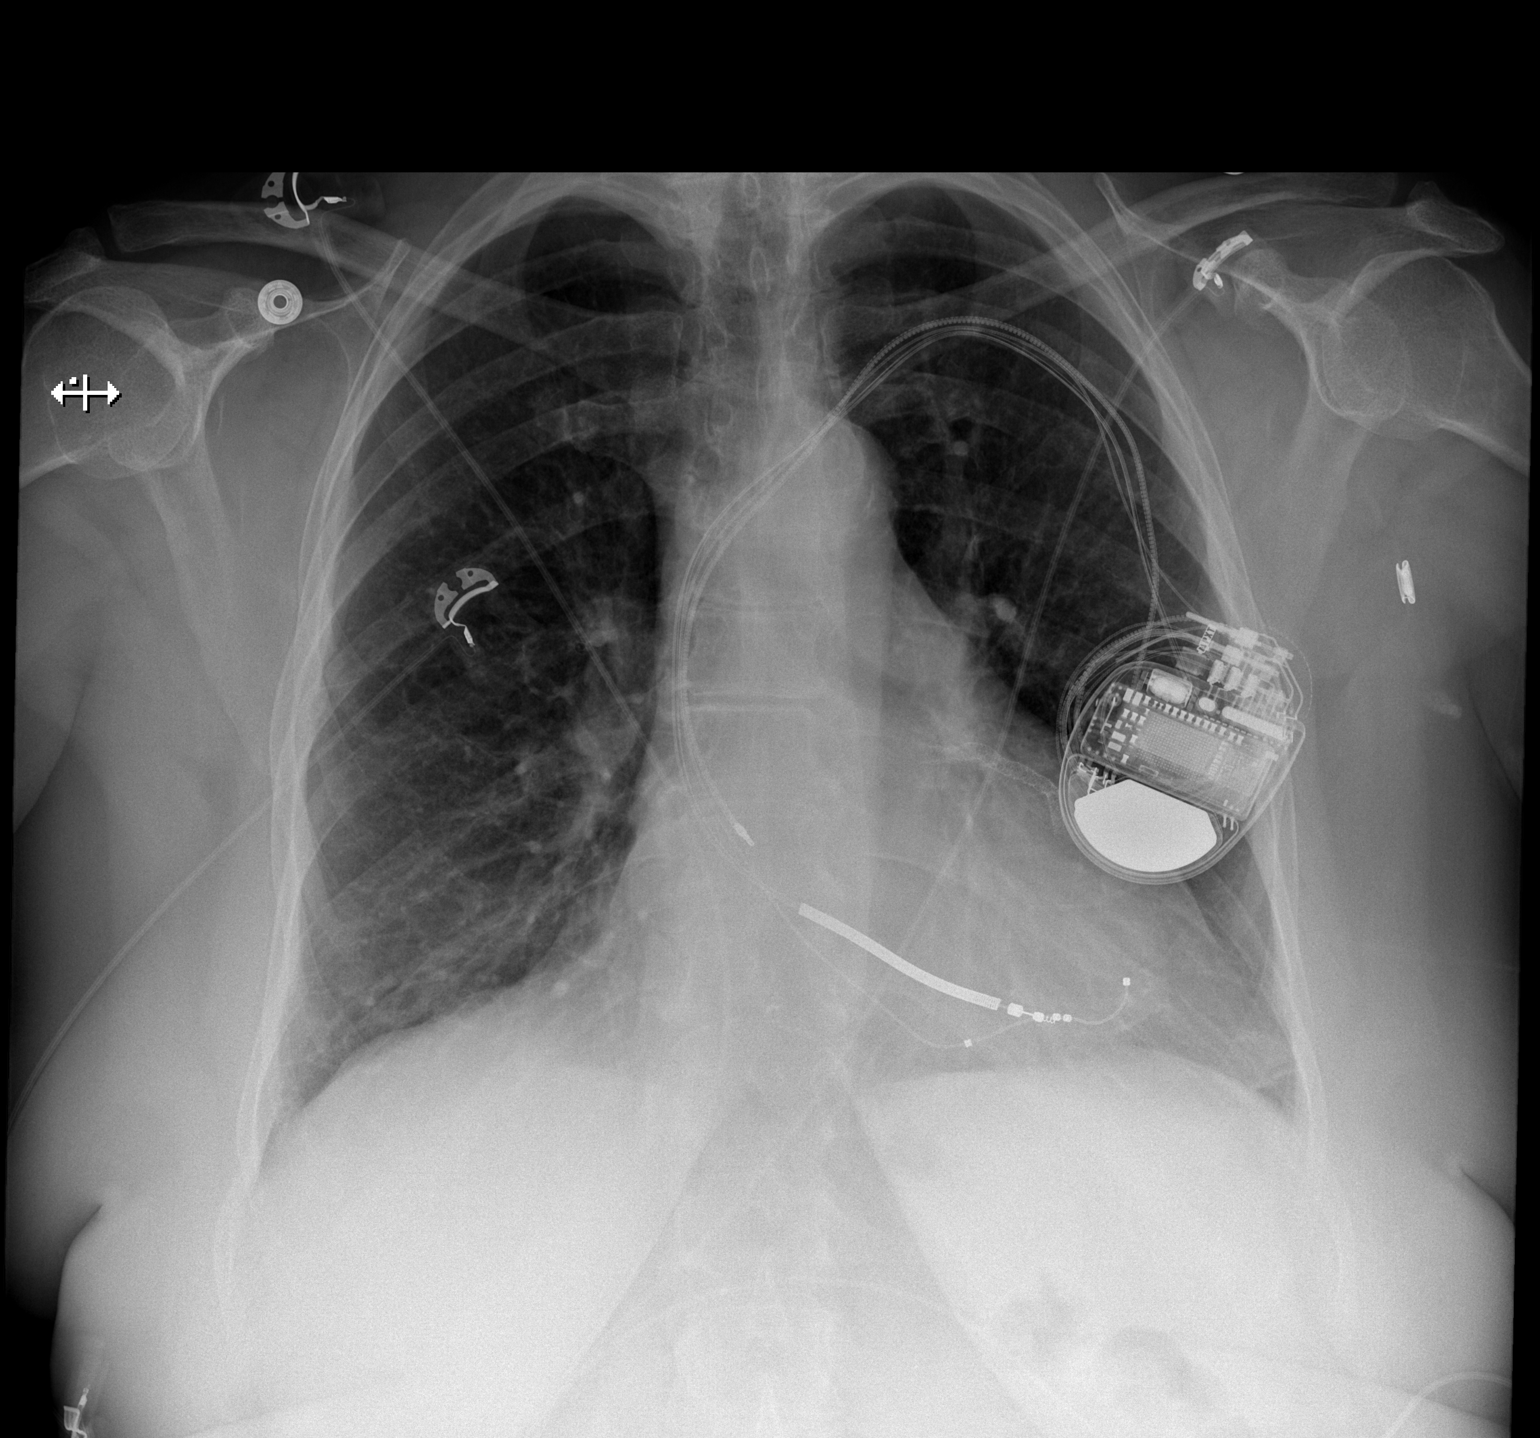

[w chest lat]
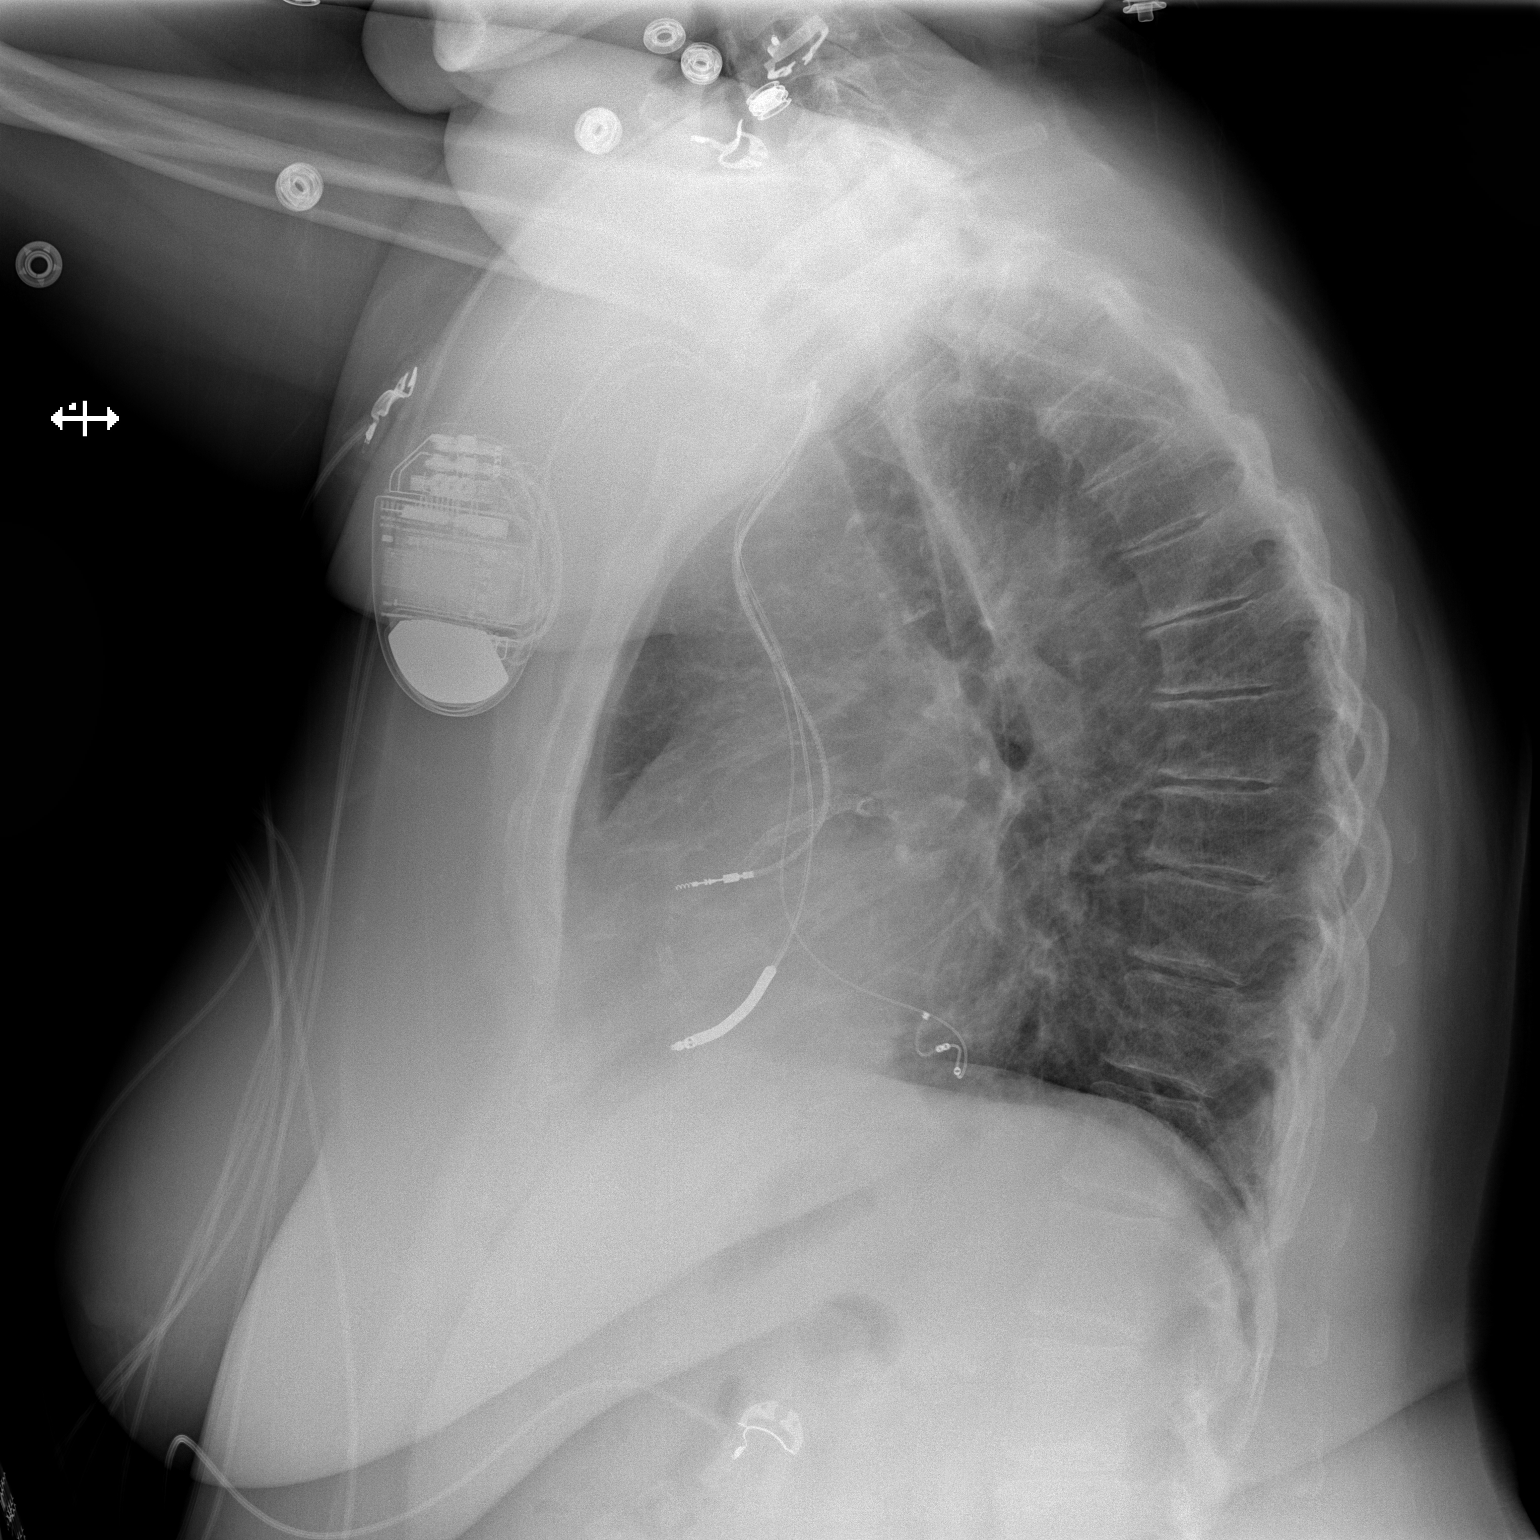

[2 of 2 positions shown; findings below may reference images not displayed]

FINDINGS: AICD in place. Heart size and pulmonary vascularity are normal. The
lungs are clear. No effusions. No acute osseous abnormality.

Aortic calcifications and coronary artery calcifications.
IMPRESSION: No acute abnormalities.

Aortic atherosclerosis.

## 2017-02-19 ENCOUNTER — Other Ambulatory Visit: Payer: Self-pay | Admitting: Cardiology

## 2017-03-10 ENCOUNTER — Ambulatory Visit (INDEPENDENT_AMBULATORY_CARE_PROVIDER_SITE_OTHER): Payer: Medicare Other | Admitting: *Deleted

## 2017-03-10 DIAGNOSIS — I255 Ischemic cardiomyopathy: Secondary | ICD-10-CM

## 2017-03-10 DIAGNOSIS — I5022 Chronic systolic (congestive) heart failure: Secondary | ICD-10-CM

## 2017-03-10 DIAGNOSIS — Z95810 Presence of automatic (implantable) cardiac defibrillator: Secondary | ICD-10-CM

## 2017-03-10 NOTE — Progress Notes (Signed)
Remote ICD transmission.   

## 2017-03-10 NOTE — Progress Notes (Signed)
EPIC Encounter for ICM Monitoring  Patient Name: Becky [name] is a 67 y.o. female Date: 03/10/2017 Primary Care Physican: Joseph Art, MD Primary Cardiologist:Branch Electrophysiologist: Allred Dry Weight: Previous weight 194lbs at MD office  Bi-V Pacing: 97.2%      Heart Failure questions reviewed, pt reported feeling tired and blase.     Thoracic impedance abnormal suggesting fluid accumulation since 02/17/2017.  Prescribed dosage: Furosemide 40 mg 1.5 tablets (60 mg total) daily and may take an extra 60 mg as needed. Potassium 20 mEq 1 tablet every other day.   Labs:  05/05/2016 Creatinine 1.05, BUN 19, Potassium 3.6, Sodium 135, EGFR 64 05/04/2016 Creatinine 0.93, BUN 21, Potassium 3.0, Sodium 133, EGFR 75 05/03/2016 Creatinine 0.93, BUN 24, Potassium 3.5, Sodium 134, EGFR 75 03/11/2016 Creatinine 0.98, BUN 17, Potassium 2.9, Sodium 136 01/18/2016 Creatinine 1.11, BUN 19, Potassium 4.5, Sodium 134, EGFR 52->60 01/17/2016 Creatinine 0.90, BUN 13, Potassium 5.1, Sodium 136, EGFR 68-78  01/16/2016 Creatinine 1.02, BUN 15, Potassium 3.4, Sodium 137, EGFR 58-67  01/15/2016 Creatinine 1.09, BUN 10, Potassium 3.0, Sodium 138, EGFR 54-62, BNP 88  08/17/2017Creatinine 1.12, BUN 27, Potassium 3.6, Sodium 134, EGFR 50-58 10/03/2015 Creatinine 1.02, BUN 20, Potassium 3.4, Sodium 136, EGFR 56->60  05/26/2015 Creatinine 1.01, BUN 12, Potassium 4.2, Sodium 137  Recommendations:  Advised patient to take Furosemide 60 mg twice a day x 3 days and then return to prescribed dosage of 60 mg once a day.  Follow-up plan: ICM clinic phone appointment on 03/13/2017 to recheck fluid levels (manual send).  Office appointment scheduled 03/20/2017 with Dr. Rayann Heman and 04/13/2017 with Dr Harl Bowie.  Copy of ICM check sent to Dr. Rayann Heman and Dr. Harl Bowie.   3 month ICM trend: 03/10/2017    1 Year ICM trend:       Rosalene Billings, RN 03/10/2017 10:12 AM

## 2017-03-11 ENCOUNTER — Encounter: Payer: Self-pay | Admitting: Cardiology

## 2017-03-13 ENCOUNTER — Telehealth: Payer: Self-pay | Admitting: Cardiology

## 2017-03-13 ENCOUNTER — Ambulatory Visit (INDEPENDENT_AMBULATORY_CARE_PROVIDER_SITE_OTHER): Payer: Self-pay

## 2017-03-13 DIAGNOSIS — Z95810 Presence of automatic (implantable) cardiac defibrillator: Secondary | ICD-10-CM

## 2017-03-13 DIAGNOSIS — I5022 Chronic systolic (congestive) heart failure: Secondary | ICD-10-CM

## 2017-03-13 NOTE — Progress Notes (Signed)
EPIC Encounter for ICM Monitoring  Patient Name: NADIYA PIERATT is a 67 y.o. female Date: 03/13/2017 Primary Care Physican: Joseph Art, MD Primary Cardiologist:Branch Electrophysiologist: Allred Dry Weight: 194lbs   Bi-V Pacing: 94.4%           Heart Failure questions reviewed, pt asymptomatic.   Thoracic impedance returned to normal after taking extra Furosemide x 3 days.  Prescribed dosage: Furosemide 40 mg 1.5 tablets (60 mg total) daily and may take an extra 60 mg as needed. Potassium 20 mEq 1 tablet every other day.   Labs:  05/05/2016 Creatinine 1.05, BUN 19, Potassium 3.6, Sodium 135, EGFR 64 05/04/2016 Creatinine 0.93, BUN 21, Potassium 3.0, Sodium 133, EGFR 75 05/03/2016 Creatinine 0.93, BUN 24, Potassium 3.5, Sodium 134, EGFR 75 03/11/2016 Creatinine 0.98, BUN 17, Potassium 2.9, Sodium 136 01/18/2016 Creatinine 1.11, BUN 19, Potassium 4.5, Sodium 134, EGFR 52->60 01/17/2016 Creatinine 0.90, BUN 13, Potassium 5.1, Sodium 136, EGFR 68-78  01/16/2016 Creatinine 1.02, BUN 15, Potassium 3.4, Sodium 137, EGFR 58-67  01/15/2016 Creatinine 1.09, BUN 10, Potassium 3.0, Sodium 138, EGFR 54-62, BNP 88  08/17/2017Creatinine 1.12, BUN 27, Potassium 3.6, Sodium 134, EGFR 50-58 10/03/2015 Creatinine 1.02, BUN 20, Potassium 3.4, Sodium 136, EGFR 56->60  05/26/2015 Creatinine 1.01, BUN 12, Potassium 4.2, Sodium 137  Recommendations: No changes.    Encouraged to call for fluid symptoms.  Follow-up plan: ICM clinic phone appointment on 04/10/2017.  Office appointment scheduled 03/20/2017 with Dr. Rayann Heman and 04/13/2017 with Dr Harl Bowie.  Copy of ICM check sent to Dr. Rayann Heman.   3 month ICM trend: 03/13/2017    1 Year ICM trend:       Rosalene Billings, RN 03/13/2017 1:27 PM

## 2017-03-13 NOTE — Telephone Encounter (Signed)
Spoke with pt and reminded pt of remote transmission that is due today. Pt verbalized understanding.   

## 2017-03-17 LAB — CUP PACEART REMOTE DEVICE CHECK
Battery Voltage: 2.93 V
Brady Statistic AP VP Percent: 21.28 %
Brady Statistic AP VS Percent: 0.37 %
Brady Statistic RV Percent Paced: 8.73 %
Date Time Interrogation Session: 20190122072603
HIGH POWER IMPEDANCE MEASURED VALUE: 91 Ohm
Implantable Lead Implant Date: 20150922
Implantable Lead Location: 753859
Implantable Lead Location: 753860
Implantable Lead Model: 4598
Implantable Lead Model: 5076
Implantable Lead Model: 6935
Lead Channel Impedance Value: 304 Ohm
Lead Channel Impedance Value: 323 Ohm
Lead Channel Impedance Value: 342 Ohm
Lead Channel Impedance Value: 513 Ohm
Lead Channel Impedance Value: 532 Ohm
Lead Channel Impedance Value: 722 Ohm
Lead Channel Pacing Threshold Amplitude: 0.625 V
Lead Channel Pacing Threshold Amplitude: 1 V
Lead Channel Pacing Threshold Pulse Width: 0.8 ms
Lead Channel Sensing Intrinsic Amplitude: 3.25 mV
Lead Channel Sensing Intrinsic Amplitude: 7.875 mV
Lead Channel Setting Pacing Amplitude: 2 V
Lead Channel Setting Pacing Amplitude: 2.75 V
Lead Channel Setting Pacing Pulse Width: 0.8 ms
MDC IDC LEAD IMPLANT DT: 20150416
MDC IDC LEAD IMPLANT DT: 20150922
MDC IDC LEAD LOCATION: 753858
MDC IDC MSMT BATTERY REMAINING LONGEVITY: 19 mo
MDC IDC MSMT LEADCHNL LV IMPEDANCE VALUE: 323 Ohm
MDC IDC MSMT LEADCHNL LV IMPEDANCE VALUE: 380 Ohm
MDC IDC MSMT LEADCHNL LV IMPEDANCE VALUE: 570 Ohm
MDC IDC MSMT LEADCHNL LV IMPEDANCE VALUE: 703 Ohm
MDC IDC MSMT LEADCHNL LV IMPEDANCE VALUE: 722 Ohm
MDC IDC MSMT LEADCHNL LV PACING THRESHOLD AMPLITUDE: 2.5 V
MDC IDC MSMT LEADCHNL RA IMPEDANCE VALUE: 437 Ohm
MDC IDC MSMT LEADCHNL RA PACING THRESHOLD PULSEWIDTH: 0.4 ms
MDC IDC MSMT LEADCHNL RA SENSING INTR AMPL: 3.25 mV
MDC IDC MSMT LEADCHNL RV IMPEDANCE VALUE: 342 Ohm
MDC IDC MSMT LEADCHNL RV PACING THRESHOLD PULSEWIDTH: 0.4 ms
MDC IDC MSMT LEADCHNL RV SENSING INTR AMPL: 7.875 mV
MDC IDC PG IMPLANT DT: 20150416
MDC IDC SET LEADCHNL RA PACING AMPLITUDE: 2 V
MDC IDC SET LEADCHNL RV PACING PULSEWIDTH: 0.4 ms
MDC IDC SET LEADCHNL RV SENSING SENSITIVITY: 0.3 mV
MDC IDC STAT BRADY AS VP PERCENT: 77.14 %
MDC IDC STAT BRADY AS VS PERCENT: 1.22 %
MDC IDC STAT BRADY RA PERCENT PACED: 21.44 %

## 2017-03-20 ENCOUNTER — Other Ambulatory Visit: Payer: Self-pay | Admitting: Cardiology

## 2017-03-20 ENCOUNTER — Encounter: Payer: Medicare Other | Admitting: Internal Medicine

## 2017-04-10 ENCOUNTER — Ambulatory Visit (INDEPENDENT_AMBULATORY_CARE_PROVIDER_SITE_OTHER): Payer: Self-pay

## 2017-04-10 DIAGNOSIS — Z95810 Presence of automatic (implantable) cardiac defibrillator: Secondary | ICD-10-CM

## 2017-04-10 DIAGNOSIS — I5022 Chronic systolic (congestive) heart failure: Secondary | ICD-10-CM

## 2017-04-10 NOTE — Progress Notes (Signed)
EPIC Encounter for ICM Monitoring  Patient Name: Becky [name] is a 67 y.o. female Date: 04/10/2017 Primary Care Physican: Joseph Art, MD Primary Cardiologist:Branch Electrophysiologist: Allred Dry Weight: 195lbs   Bi-V Pacing: 98%      Heart Failure questions reviewed, pt has been feeling tired this week.   Thoracic impedance abnormal suggesting fluid accumulation.  Prescribed dosage: Furosemide 40 mg 1.5 tablets (60 mg total) daily and may take an extra 60 mg as needed. Potassium 20 mEq 1 tablet every other day.   Labs:  05/05/2016 Creatinine 1.05, BUN 19, Potassium 3.6, Sodium 135, EGFR 64 05/04/2016 Creatinine 0.93, BUN 21, Potassium 3.0, Sodium 133, EGFR 75 05/03/2016 Creatinine 0.93, BUN 24, Potassium 3.5, Sodium 134, EGFR 75 03/11/2016 Creatinine 0.98, BUN 17, Potassium 2.9, Sodium 136 01/18/2016 Creatinine 1.11, BUN 19, Potassium 4.5, Sodium 134, EGFR 52->60 01/17/2016 Creatinine 0.90, BUN 13, Potassium 5.1, Sodium 136, EGFR 68-78  01/16/2016 Creatinine 1.02, BUN 15, Potassium 3.4, Sodium 137, EGFR 58-67  01/15/2016 Creatinine 1.09, BUN 10, Potassium 3.0, Sodium 138, EGFR 54-62, BNP 88  08/17/2017Creatinine 1.12, BUN 27, Potassium 3.6, Sodium 134, EGFR 50-58 10/03/2015 Creatinine 1.02, BUN 20, Potassium 3.4, Sodium 136, EGFR 56->60  05/26/2015 Creatinine 1.01, BUN 12, Potassium 4.2, Sodium 137  Recommendations:  She said she would take extra Furosemide for the next 2 days.     Encouraged to call for fluid symptoms.  Follow-up plan: ICM clinic phone appointment on 04/20/2017 to recheck fluid symptoms.  Office appointment scheduled 04/13/2017 with Dr. Harl Bowie.  Copy of ICM check sent to Dr. Harl Bowie and Dr. Rayann Heman.   3 month ICM trend: 04/10/2017    1 Year ICM trend:       Rosalene Billings, RN 04/10/2017 8:59 AM

## 2017-04-13 ENCOUNTER — Encounter: Payer: Self-pay | Admitting: *Deleted

## 2017-04-13 ENCOUNTER — Ambulatory Visit (INDEPENDENT_AMBULATORY_CARE_PROVIDER_SITE_OTHER): Payer: Medicare Other | Admitting: Cardiology

## 2017-04-13 ENCOUNTER — Encounter: Payer: Self-pay | Admitting: Cardiology

## 2017-04-13 VITALS — BP 138/62 | HR 60 | Ht 60.0 in | Wt 185.6 lb

## 2017-04-13 DIAGNOSIS — E782 Mixed hyperlipidemia: Secondary | ICD-10-CM

## 2017-04-13 DIAGNOSIS — I255 Ischemic cardiomyopathy: Secondary | ICD-10-CM

## 2017-04-13 DIAGNOSIS — I5022 Chronic systolic (congestive) heart failure: Secondary | ICD-10-CM | POA: Diagnosis not present

## 2017-04-13 DIAGNOSIS — I10 Essential (primary) hypertension: Secondary | ICD-10-CM

## 2017-04-13 NOTE — Patient Instructions (Signed)
Your physician wants you to follow-up in: 4 MONTHS WITH DR BRANCH You will receive a reminder letter in the mail two months in advance. If you don't receive a letter, please call our office to schedule the follow-up appointment.  Your physician recommends that you continue on your current medications as directed. Please refer to the Current Medication list given to you today.  Thank you for choosing [facility]!!   

## 2017-04-13 NOTE — Progress Notes (Signed)
Clinical Summary Ms. Becky [name] is a 67 y.o.female seen today for follow up of the following medical problems.  1. ICM/Chronic systolic heart failure - history of multiple PCIs as described below, mainly in [place].  - 04/2013 LVEF 30-35%. Repeat echo after BiV AICD 05/2014 shows LVEF 79%, grade I diastolic dysfunction.  - 09/2015 echo LVEF 55-60%, no WMAs, grade I diastolic dysfunction - 15/0569 echo [place]: LVEF 65-70%, moderate LVH.  - medical therapy has been limited by orthostatic symtpoms -she has been on long term plavix due to her extensive stent burden     - no recent SOB/DOE. No recent chest pain - compliant with meds.  - no recent orthostatioc symptoms  2. OSA   - compliant with cpap, followed by Dr Marjory Lies in [place], [place].   3. Hyperlipidemia - muscle aches on multiple statins - tolerating pravastatin   - continue to be compliant with meds  4. HTN -has not taken meds yet today, but overall compliant  5. COPD - followed bypulmonary in [place]  - followed by Dr Luciana Axe  Past Medical History:  Diagnosis Date  . AICD (automatic cardioverter/defibrillator) present   . Anxiety   . Arthritis    "all my joints; neck, back, legs, arms, elbows"  (10/03/2015)  . Asthma   . CAD (coronary artery disease)    a. 5/08: s/p DES to PDA and DES to LAD; b. 1/09: s/p DES to pLAD; c. 1/11: s/p DES to LAD x 2, d. LHC (3/12 in [place] - EF 55%, mild ant HK, nl LM, LAD stents ok, oLAD 30, RCA stents ok;  e.  4/13:  s/p DES to RCA, f. 1/14: dLM 20-30, mOM3 20-30, mLAD 80 ISR => s/p 2.5x16 mm Promus Element DES; EF 45-50%   . CHF (congestive heart failure) ([place])   . Chronic bronchitis ([place])   . Chronic lower back pain    "into pelvis region" (10/03/2015)  . COPD (chronic obstructive pulmonary disease) ([place])   . Daily headache    "might be from sinus" (10/03/2015)  . Depression    hx (10/03/2015)  . Fibromyalgia   . GERD (gastroesophageal reflux  disease)   . Heart murmur   . HLD (hyperlipidemia)   . Hypertension   . Ischemic cardiomyopathy    a. Echo (11/14):  EF 25%   . LBBB (left bundle branch block)   . Myocardial infarction ([facility]) 12/2012  . OSA on CPAP   . Pneumonia 1950s  . Syncope 11/14   LifeVest placed  . Upper back pain, chronic    "into my shoulders; fibromyalgia works here" (10/03/2015)     Allergies  Allergen Reactions  . Tape Rash and Other (See Comments)    Severe Reaction (latex tape): Burned hand, Redness-took days to clear up (03/02/12)  . Codeine Nausea And Vomiting  . Prozac [Fluoxetine Hcl] Other (See Comments)    Mean/violent thoughts  . Fosamax [Alendronate Sodium] Nausea Only and Other (See Comments)    Stomach pain  . Keflex [Cephalexin] Nausea Only  . Lipitor [Atorvastatin] Other (See Comments)    Leg pain     Current Outpatient Medications  Medication Sig Dispense Refill  . acetaminophen-codeine (TYLENOL #3) 300-30 MG tablet Take 1 tablet by mouth every 6 (six) hours as needed for moderate pain.   2  . Aspirin-Salicylamide-Caffeine (BC HEADACHE PO) Take 1 each by mouth as needed.     . budesonide (PULMICORT) 0.5 MG/2ML nebulizer solution Take 0.5 mg by nebulization  2 (two) times daily.    . calcium carbonate (CALCIUM 600) 1500 (600 Ca) MG TABS tablet Take 600 mg of elemental calcium by mouth daily with breakfast.    . Cholecalciferol (VITAMIN D) 2000 UNITS CAPS Take 2,000 Units by mouth daily.    . clopidogrel (PLAVIX) 75 MG tablet Take 75 mg by mouth daily.     Marland Kitchen Dextromethorphan-Guaifenesin (MUCINEX DM) 30-600 MG TB12 Take 1 tablet by mouth 2 (two) times daily as needed.  0  . escitalopram (LEXAPRO) 20 MG tablet Take 20 mg by mouth daily.     . fluticasone (FLONASE) 50 MCG/ACT nasal spray Place 2 sprays into both nostrils daily.  7  . formoterol (PERFOROMIST) 20 MCG/2ML nebulizer solution Take 20 mcg by nebulization 2 (two) times daily.    . furosemide (LASIX) 40 MG tablet TAKE 1 AND 1/2  TABLET BY MOUTH DAILY. MAY TAKE EXTRA 1 AND 1/2 TABLET AS NEEDED 90 tablet 1  . furosemide (LASIX) 40 MG tablet TAKE 1 AND 1/2 TABLET BY MOUTH DAILY. MAY TAKE EXTRA 1 AND 1/2 TABLET AS NEEDED 90 tablet 0  . ipratropium-albuterol (DUONEB) 0.5-2.5 (3) MG/3ML SOLN Take 3 mLs by nebulization every 6 (six) hours as needed (shortness of breath).    . lactulose (CHRONULAC) 10 GM/15ML solution Take 10 g by mouth 2 (two) times daily as needed for mild constipation.    . magnesium oxide (MAG-OX) 400 MG tablet Take 400 mg by mouth daily.    . meclizine (ANTIVERT) 25 MG tablet Take 1 tablet by mouth 2 (two) times daily as needed.  3  . metFORMIN (GLUCOPHAGE) 500 MG tablet Take 500 mg by mouth 2 (two) times daily with a meal.    . metoprolol succinate (TOPROL-XL) 100 MG 24 hr tablet TAKE 1 TABLET(100 MG) BY MOUTH DAILY 90 tablet 3  . montelukast (SINGULAIR) 10 MG tablet Take 10 mg by mouth every morning.     . nitroGLYCERIN (NITROSTAT) 0.4 MG SL tablet Place 1 tablet (0.4 mg total) under the tongue every 5 (five) minutes x 3 doses as needed. If no relief after 3rd dose, proceed to ED 25 tablet 5  . pantoprazole (PROTONIX) 40 MG tablet TAKE 1 TABLET BY MOUTH DAILY 30 tablet 0  . potassium chloride SA (K-DUR,KLOR-CON) 20 MEQ tablet TAKE 1 TABLET BY MOUTH EVERY OTHER DAY 45 tablet 0  . pravastatin (PRAVACHOL) 40 MG tablet Take 40 mg by mouth daily.    . pravastatin (PRAVACHOL) 40 MG tablet TAKE 1 TABLET BY MOUTH EVERY EVENING 90 tablet 3  . pravastatin (PRAVACHOL) 40 MG tablet TAKE 1 TABLET BY MOUTH EVERY EVENING 90 tablet 3  . rOPINIRole (REQUIP) 0.5 MG tablet Take 0.5 mg by mouth at bedtime.     . sitaGLIPtin (JANUVIA) 50 MG tablet Take 50 mg by mouth daily.    Marland Kitchen spironolactone (ALDACTONE) 25 MG tablet TAKE 1/2 TABLET BY MOUTH DAILY 45 tablet 3   No current facility-administered medications for this visit.      Past Surgical History:  Procedure Laterality Date  . BI-VENTRICULAR IMPLANTABLE CARDIOVERTER  DEFIBRILLATOR N/A 06/02/2013   Procedure: BI-VENTRICULAR IMPLANTABLE CARDIOVERTER DEFIBRILLATOR  (CRT-D);  Surgeon: Coralyn Mark, MD;  Location: [facility] CATH LAB;  Service: Cardiovascular;  Laterality: N/A;  . BI-VENTRICULAR IMPLANTABLE CARDIOVERTER DEFIBRILLATOR  (CRT-D)  06/02/2013   MDT VivaQuad CRTD implanted by Dr Rayann Heman for ICM, CHF  . CARDIAC CATHETERIZATION  12/2012   S/P MI  . CORONARY ANGIOPLASTY WITH STENT PLACEMENT  2007;  2009; 2011   "?2 +1 +1 "  . CORONARY ANGIOPLASTY WITH STENT PLACEMENT  02/2012  . FRACTURE SURGERY    . HYSTEROSCOPY W/ ENDOMETRIAL ABLATION  ~ 1998  . LEAD REVISION  11-08-13   RA and LV lead revision by Dr Rayann Heman  . LEAD REVISION Left 11/08/2013   Procedure: LEAD REVISION;  Surgeon: Coralyn Mark, MD;  Location: [facility] CATH LAB;  Service: Cardiovascular;  Laterality: Left;  . LEFT HEART CATHETERIZATION WITH CORONARY ANGIOGRAM N/A 01/03/2013   Procedure: LEFT HEART CATHETERIZATION WITH CORONARY ANGIOGRAM;  Surgeon: Burnell Blanks, MD;  Location: [facility] CATH LAB;  Service: Cardiovascular;  Laterality: N/A;  . ORIF ANKLE FRACTURE Right 10/21/2009  . TUBAL LIGATION  1989     Allergies  Allergen Reactions  . Tape Rash and Other (See Comments)    Severe Reaction (latex tape): Burned hand, Redness-took days to clear up (03/02/12)  . Codeine Nausea And Vomiting  . Prozac [Fluoxetine Hcl] Other (See Comments)    Mean/violent thoughts  . Fosamax [Alendronate Sodium] Nausea Only and Other (See Comments)    Stomach pain  . Keflex [Cephalexin] Nausea Only  . Lipitor [Atorvastatin] Other (See Comments)    Leg pain      Family History  Problem Relation Age of Onset  . Breast cancer Mother   . Heart failure Brother   . Breast cancer Sister   . Breast cancer Sister   . Uterine cancer Sister      Social History Ms. Lamica reports that she has been smoking cigarettes.  She started smoking about 26 years ago. She has a 10.00 pack-year smoking history. she has  never used smokeless tobacco. Ms. Brandes reports that she drinks alcohol.   Review of Systems CONSTITUTIONAL: No weight loss, fever, chills, weakness or fatigue.  HEENT: Eyes: No visual loss, blurred vision, double vision or yellow sclerae.No hearing loss, sneezing, congestion, runny nose or sore throat.  SKIN: No rash or itching.  CARDIOVASCULAR: per hpi RESPIRATORY: No shortness of breath, cough or sputum.  GASTROINTESTINAL: No anorexia, nausea, vomiting or diarrhea. No abdominal pain or blood.  GENITOURINARY: No burning on urination, no polyuria NEUROLOGICAL: No headache, dizziness, syncope, paralysis, ataxia, numbness or tingling in the extremities. No change in bowel or bladder control.  MUSCULOSKELETAL: No muscle, back pain, joint pain or stiffness.  LYMPHATICS: No enlarged nodes. No history of splenectomy.  PSYCHIATRIC: No history of depression or anxiety.  ENDOCRINOLOGIC: No reports of sweating, cold or heat intolerance. No polyuria or polydipsia.  Marland Kitchen   Physical Examination Vitals:   04/13/17 0911  BP: 138/62  Pulse: 60  SpO2: 95%   Vitals:   04/13/17 0911  Weight: 185 lb 9.6 oz (84.2 kg)  Height: 5' (1.524 m)    Gen: resting comfortably, no acute distress HEENT: no scleral icterus, pupils equal round and reactive, no palptable cervical adenopathy,  CV: RRR, no mrg, nno jvd Resp: Clear to auscultation bilaterally GI: abdomen is soft, non-tender, non-distended, normal bowel sounds, no hepatosplenomegaly MSK: extremities are warm, no edema.  Skin: warm, no rash Neuro:  no focal deficits Psych: appropriate affect   Diagnostic Studies  04/2010 Cath [place], [place]: LVEF 55%, mild anterior hypokinesis. LM normal, LAD with stents in prox and mid portion widely patent, LAD ostial 30%. Diags with luminal irregs. LCX with luminial irregs. RCA with distal patent stents  Stent cards May 2008 DES to PDA, DES to LAD  Jan 2009 DES to prox LAD  Mar 05 2009  DES to LAD  x2  05/2011 DES to RCA  Jan 2014 stent done [place], [place]  03/2010 Echo: LVEF 40%, hypokinesis of the anteroseptum.   01/02/13 Cath Hemodynamic Findings: Central aortic pressure: 147/79  Left ventricular pressure: 130/22/26  Angiographic Findings: Left main: 30% distal stenosis. It appears that the distal left main has a stent that continues into the LAD.  Left Anterior Descending Artery: Large vessel that courses to the apex. The entire proximal and mid LAD is stented. There is mild stent restenosis in the proximal segment. The mid stented segment has diffuse 30% stent restenosis. The distal vessel becomes small in caliber and has mild diffuse plaque. There is a moderate caliber first diagonal branch with mild plaque disease.  Circumflex Artery: Moderate caliber non-dominant vessel with three moderate caliber obtuse marginal branches. No obstructive disease.  Right Coronary Artery: Large dominant vessel with 30% proximal stenosis, heavily calcified mid vessel with patent stent in the mid vessel (no significant restenosis). The distal vessel has diffuse 40-50% stenosis. The posterolateral branch and PDA are moderate caliber, patent vessels with mild plaque disease.  Left Ventricular Angiogram: LVEF=25-30%. Hypokinesis of the antero-apical wall.  Impression:  1. 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 Elevated troponin following syncopal event. No culprit lesions seen.  3. Moderate to severe LV systolic dysfunction.  Recommendations: Continue medical management of CAD. Workup for syncope is underway. Carotid dopplers today. Will check d-dimer as PE is a possibility. She could also have had an arrythmia with LV dysfunction.  Complications: None. The patient tolerated the procedure well.  01/03/13 Echo - LVEF 25-30%, akinesis of the anteroseptal and apical myocardium, grade I diastolic dysfunction,  79/89/21 Carotid US - The vertebral arteries  appear patent with antegrade flow. - Findings consistent with1- 39 percent stenosis,high end of scale,involving the right internal carotid artery. - Findings consistent with 40 - 59 percent stenosis, low end of scale,involving the left internal carotid artery. - ICA/CCA ratio. right =1.72. left = 1.52 Other specific details can be found in the table(s) above. Prepared and Electronically Authenticated by 12/13/12 Event monitor No symptoms reported, sinus rhythm with conduction delay, occasional PVCs.  04/2013 Echo Study Conclusions  - Left ventricle: The cavity size was normal. Wall thickness was increased in a pattern of mild LVH with moderate basal septal hypertrophy. Systolic function was moderately to severely reduced. The estimated ejection fraction was in the range of 30% to 35%. There is akinesis to dyskinesis of the mid-distal anteroseptal and apical myocardium. There is akinesis of the distalinferoseptal myocardium. Doppler parameters are consistent with abnormal left ventricular relaxation (grade 1 diastolic dysfunction). - Ventricular septum: Septal motion showed abnormal function and dyssynergy. - Aortic valve: Mildly calcified annulus. Probably trileaflet; mildly calcified leaflets. No significant regurgitation. - Mitral valve: Calcified annulus. Trivial regurgitation. - Left atrium: The atrium was mildly dilated. - Right atrium: Central venous pressure: 2m Hg (est). - Tricuspid valve: Trivial regurgitation. - Pulmonary arteries: PA peak pressure: 24mHg (S). - Pericardium, extracardiac: There was no pericardial effusion. Impressions:  [place] basal septal hypertrophy, LVEF 30-35% with wall motion abnormalities as outlined, grade 1 diastolic dysfunction. Mild left atrial enlagement. MAC with trivial mitral regurgitation. MIldlysclerotic aortic valve. Trivial tricuspid regurgitation with normal PASP 20 mmHg. 05/2014 echo Study  Conclusions  - Left ventricle: Technically limited study. The cavity size was normal. Wall thickness was increased in a pattern of moderate LVH. The estimated ejection fraction was 55%. Doppler parameters  are consistent with abnormal left ventricular relaxation (grade 1 diastolic dysfunction). - Aortic valve: Sclerosis without stenosis. - Right ventricle: The cavity size was normal. Pacer wire or catheter noted in right ventricle. Systolic function was normal. - Impressions: Since the study in 2015, there is definite improvement in LV function.  Impressions:  - Since the study in 2015, there is definite improvement in LV function.   09/2015 Nuclear Stress  There was no ST segment deviation noted during stress.  No T wave inversion was noted during stress.  Defect 1: There is a medium defect of mild severity present in the basal inferior and mid inferior location. This defect is likely a result of bowel loop attenuation artifact. No significant ischemia identified.  This is a low risk study. No significant ischemia identified.  Nuclear stress EF: 54%. Mid to apical septal wall hypokinesis noted   09/2015 echo Study Conclusions  - Left ventricle: The cavity size was normal. Systolic function was normal. The estimated ejection fraction was in the range of 55% to 60%. Wall motion was normal; there were no regional wall motion abnormalities. Doppler parameters are consistent with abnormal left ventricular relaxation (grade 1 diastolic dysfunction). Doppler parameters are consistent with indeterminate ventricular filling pressure. - Aortic valve: Transvalvular velocity was within the normal range. There was no stenosis. There was no regurgitation. - Mitral valve: Transvalvular velocity was within the normal range. There was no evidence for stenosis. There was no regurgitation. - Left atrium: The atrium was moderately dilated. - Right  ventricle: The cavity size was normal. Wall thickness was normal. Systolic function was normal. - Tricuspid valve: There was no regurgitation.       Assessment and Plan   1. ICM/Chronic systolic heart failure  - previous LVEF 30-35%. After BiV AICD repeat echo with normalized LVEF - medical therapy has been limited due to orthstatic symptoms - indefinite plavix due to large stent burden  - no symptoms, she will continue current meds  2. OSA - she will continue CPAP  3. Hyperlipidemia - difficulty tolerating statins in the past, has tolerated pravastatin - continue current statin  4. HTN - mildly elevated, has not taken meds yet today - continue to monitor.    F/u 4 months   Arnoldo Lenis, M.D.

## 2017-04-16 ENCOUNTER — Other Ambulatory Visit: Payer: Self-pay | Admitting: Cardiology

## 2017-04-20 ENCOUNTER — Ambulatory Visit (INDEPENDENT_AMBULATORY_CARE_PROVIDER_SITE_OTHER): Payer: Medicare Other

## 2017-04-20 ENCOUNTER — Telehealth: Payer: Self-pay

## 2017-04-20 DIAGNOSIS — I5022 Chronic systolic (congestive) heart failure: Secondary | ICD-10-CM | POA: Diagnosis not present

## 2017-04-20 DIAGNOSIS — Z95810 Presence of automatic (implantable) cardiac defibrillator: Secondary | ICD-10-CM

## 2017-04-20 NOTE — Progress Notes (Signed)
EPIC Encounter for ICM Monitoring  Patient Name: Becky [name] is a 67 y.o. female Date: 04/20/2017 Primary Care Physican: Joseph Art, MD Primary Cardiologist:Branch Electrophysiologist: Allred Dry Weight: Previous weight 195lbs  Bi-V Pacing: 97.5%       Attempted call to patient and unable to reach.  Left to return call regarding transmission.  Transmission reviewed.    Thoracic impedance returns to normal when she takes extra Furosemide but then re-accumulates but has returned to below baseline again suggesting fluid is reaccumulating.  Prescribed dosage: Furosemide 40 mg 1.5 tablets (60 mg total) daily and may take an extra 60 mg as needed. Potassium 20 mEq 1 tablet every other day.   Labs:  05/05/2016 Creatinine 1.05, BUN 19, Potassium 3.6, Sodium 135, EGFR 64 05/04/2016 Creatinine 0.93, BUN 21, Potassium 3.0, Sodium 133, EGFR 75 05/03/2016 Creatinine 0.93, BUN 24, Potassium 3.5, Sodium 134, EGFR 75 03/11/2016 Creatinine 0.98, BUN 17, Potassium 2.9, Sodium 136 01/18/2016 Creatinine 1.11, BUN 19, Potassium 4.5, Sodium 134, EGFR 52->60 01/17/2016 Creatinine 0.90, BUN 13, Potassium 5.1, Sodium 136, EGFR 68-78  01/16/2016 Creatinine 1.02, BUN 15, Potassium 3.4, Sodium 137, EGFR 58-67  01/15/2016 Creatinine 1.09, BUN 10, Potassium 3.0, Sodium 138, EGFR 54-62, BNP 88  08/17/2017Creatinine 1.12, BUN 27, Potassium 3.6, Sodium 134, EGFR 50-58 10/03/2015 Creatinine 1.02, BUN 20, Potassium 3.4, Sodium 136, EGFR 56->60  05/26/2015 Creatinine 1.01, BUN 12, Potassium 4.2, Sodium 137  Recommendations: NONE - Unable to reach.  Follow-up plan: ICM clinic phone appointment on 05/07/2017.  Office appointment scheduled 06/19/2017 with Dr. Rayann Heman.  Copy of ICM check sent to Dr. Rayann Heman and Dr. Harl Bowie.   3 month ICM trend: 04/20/2017    1 Year ICM trend:       Rosalene Billings, RN 04/20/2017 11:02 AM

## 2017-04-20 NOTE — Telephone Encounter (Signed)
Remote ICM transmission received.  Attempted call to patient and left message to return call regarding transmission. 

## 2017-05-07 ENCOUNTER — Ambulatory Visit (INDEPENDENT_AMBULATORY_CARE_PROVIDER_SITE_OTHER): Payer: Self-pay

## 2017-05-07 DIAGNOSIS — I5022 Chronic systolic (congestive) heart failure: Secondary | ICD-10-CM

## 2017-05-07 DIAGNOSIS — Z95810 Presence of automatic (implantable) cardiac defibrillator: Secondary | ICD-10-CM

## 2017-05-07 NOTE — Progress Notes (Signed)
EPIC Encounter for ICM Monitoring  Patient Name: Becky [name] is a 67 y.o. female Date: 05/07/2017 Primary Care Physican: Joseph Art, MD Primary Cardiologist:Branch Electrophysiologist: Allred Dry Weight: 195lbs  Bi-V Pacing: 97.4%       Heart Failure questions reviewed, pt asymptomatic except for allergies.   Thoracic impedance normal but has a pattern of peaks and valleys suggesting many days with fluid accumulation.  Impedance returns to baseline when patient takes extra Furosemide.   Prescribed dosage: Furosemide 40 mg 1.5 tablets (60 mg total) daily and may take an extra 60 mg as needed. Potassium 20 mEq 1 tablet every other day.   Labs:  05/05/2016 Creatinine 1.05, BUN 19, Potassium 3.6, Sodium 135, EGFR 64 05/04/2016 Creatinine 0.93, BUN 21, Potassium 3.0, Sodium 133, EGFR 75 05/03/2016 Creatinine 0.93, BUN 24, Potassium 3.5, Sodium 134, EGFR 75 03/11/2016 Creatinine 0.98, BUN 17, Potassium 2.9, Sodium 136 01/18/2016 Creatinine 1.11, BUN 19, Potassium 4.5, Sodium 134, EGFR 52->60 01/17/2016 Creatinine 0.90, BUN 13, Potassium 5.1, Sodium 136, EGFR 68-78  01/16/2016 Creatinine 1.02, BUN 15, Potassium 3.4, Sodium 137, EGFR 58-67  01/15/2016 Creatinine 1.09, BUN 10, Potassium 3.0, Sodium 138, EGFR 54-62, BNP 88  08/17/2017Creatinine 1.12, BUN 27, Potassium 3.6, Sodium 134, EGFR 50-58 10/03/2015 Creatinine 1.02, BUN 20, Potassium 3.4, Sodium 136, EGFR 56->60  05/26/2015 Creatinine 1.01, BUN 12, Potassium 4.2, Sodium 137  Recommendations: No changes.   Encouraged to call for fluid symptoms.  Follow-up plan: ICM clinic phone appointment on 06/09/2017.  Office appointment scheduled 06/19/2017 with Dr. Rayann Heman.  Copy of ICM check sent to Dr. Rayann Heman and Dr. Harl Bowie.   3 month ICM trend: 05/07/2017    1 Year ICM trend:       Rosalene Billings, RN 05/07/2017 1:20 PM

## 2017-05-21 ENCOUNTER — Other Ambulatory Visit: Payer: Self-pay | Admitting: Cardiology

## 2017-06-09 ENCOUNTER — Ambulatory Visit (INDEPENDENT_AMBULATORY_CARE_PROVIDER_SITE_OTHER): Payer: Medicare Other | Admitting: *Deleted

## 2017-06-09 DIAGNOSIS — I255 Ischemic cardiomyopathy: Secondary | ICD-10-CM | POA: Diagnosis not present

## 2017-06-09 DIAGNOSIS — I5022 Chronic systolic (congestive) heart failure: Secondary | ICD-10-CM

## 2017-06-09 DIAGNOSIS — Z95810 Presence of automatic (implantable) cardiac defibrillator: Secondary | ICD-10-CM

## 2017-06-09 NOTE — Progress Notes (Signed)
EPIC Encounter for ICM Monitoring  Patient Name: Becky [name] is a 67 y.o. female Date: 06/09/2017 Primary Care Physican: Joseph Art, MD Primary Cardiologist:Branch Electrophysiologist: Allred Dry Weight: 195lbs  Bi-V Pacing: 97.6%       Heart Failure questions reviewed, pt asymptomatic.   Thoracic impedance normal for the last few days.  Prescribed dosage: Furosemide 40 mg 1.5 tablets (60 mg total) daily and may take an extra 60 mg as needed. Potassium 20 mEq 1 tablet every other day.   Labs:  05/05/2016 Creatinine 1.05, BUN 19, Potassium 3.6, Sodium 135, EGFR 64 05/04/2016 Creatinine 0.93, BUN 21, Potassium 3.0, Sodium 133, EGFR 75 05/03/2016 Creatinine 0.93, BUN 24, Potassium 3.5, Sodium 134, EGFR 75 03/11/2016 Creatinine 0.98, BUN 17, Potassium 2.9, Sodium 136 01/18/2016 Creatinine 1.11, BUN 19, Potassium 4.5, Sodium 134, EGFR 52->60 01/17/2016 Creatinine 0.90, BUN 13, Potassium 5.1, Sodium 136, EGFR 68-78  01/16/2016 Creatinine 1.02, BUN 15, Potassium 3.4, Sodium 137, EGFR 58-67  01/15/2016 Creatinine 1.09, BUN 10, Potassium 3.0, Sodium 138, EGFR 54-62, BNP 88  08/17/2017Creatinine 1.12, BUN 27, Potassium 3.6, Sodium 134, EGFR 50-58 10/03/2015 Creatinine 1.02, BUN 20, Potassium 3.4, Sodium 136, EGFR 56->60  05/26/2015 Creatinine 1.01, BUN 12, Potassium 4.2, Sodium 137  Recommendations: No changes.   Encouraged to call for fluid symptoms.  Follow-up plan: ICM clinic phone appointment on 07/20/2017.  Office appointment scheduled 06/19/2017 with Dr. Rayann Heman.  Copy of ICM check sent to Dr. Rayann Heman.   3 month ICM trend: 06/09/2017    1 Year ICM trend:       Rosalene Billings, RN 06/09/2017 5:13 PM

## 2017-06-09 NOTE — Progress Notes (Signed)
Remote ICD transmission.   

## 2017-06-10 ENCOUNTER — Encounter: Payer: Self-pay | Admitting: Cardiology

## 2017-06-12 LAB — CUP PACEART REMOTE DEVICE CHECK
Battery Remaining Longevity: 19 mo
Battery Voltage: 2.93 V
Brady Statistic AP VP Percent: 23.28 %
Brady Statistic AS VP Percent: 75.13 %
Brady Statistic AS VS Percent: 1.18 %
Date Time Interrogation Session: 20190423062603
HIGH POWER IMPEDANCE MEASURED VALUE: 84 Ohm
Implantable Lead Location: 753859
Implantable Lead Location: 753860
Implantable Lead Model: 4598
Implantable Lead Model: 5076
Lead Channel Impedance Value: 266 Ohm
Lead Channel Impedance Value: 304 Ohm
Lead Channel Impedance Value: 304 Ohm
Lead Channel Impedance Value: 342 Ohm
Lead Channel Impedance Value: 342 Ohm
Lead Channel Impedance Value: 513 Ohm
Lead Channel Impedance Value: 627 Ohm
Lead Channel Impedance Value: 665 Ohm
Lead Channel Pacing Threshold Amplitude: 0.625 V
Lead Channel Pacing Threshold Amplitude: 1 V
Lead Channel Pacing Threshold Amplitude: 3.5 V
Lead Channel Pacing Threshold Pulse Width: 0.4 ms
Lead Channel Pacing Threshold Pulse Width: 0.4 ms
Lead Channel Pacing Threshold Pulse Width: 0.8 ms
Lead Channel Sensing Intrinsic Amplitude: 3.375 mV
Lead Channel Setting Pacing Amplitude: 2 V
Lead Channel Setting Pacing Amplitude: 2 V
Lead Channel Setting Pacing Pulse Width: 0.4 ms
Lead Channel Setting Sensing Sensitivity: 0.3 mV
MDC IDC LEAD IMPLANT DT: 20150416
MDC IDC LEAD IMPLANT DT: 20150922
MDC IDC LEAD IMPLANT DT: 20150922
MDC IDC LEAD LOCATION: 753858
MDC IDC MSMT LEADCHNL LV IMPEDANCE VALUE: 342 Ohm
MDC IDC MSMT LEADCHNL LV IMPEDANCE VALUE: 437 Ohm
MDC IDC MSMT LEADCHNL LV IMPEDANCE VALUE: 532 Ohm
MDC IDC MSMT LEADCHNL LV IMPEDANCE VALUE: 760 Ohm
MDC IDC MSMT LEADCHNL RA IMPEDANCE VALUE: 399 Ohm
MDC IDC MSMT LEADCHNL RA SENSING INTR AMPL: 3.375 mV
MDC IDC MSMT LEADCHNL RV SENSING INTR AMPL: 7.25 mV
MDC IDC MSMT LEADCHNL RV SENSING INTR AMPL: 7.25 mV
MDC IDC PG IMPLANT DT: 20150416
MDC IDC SET LEADCHNL LV PACING AMPLITUDE: 2.75 V
MDC IDC SET LEADCHNL LV PACING PULSEWIDTH: 0.8 ms
MDC IDC STAT BRADY AP VS PERCENT: 0.42 %
MDC IDC STAT BRADY RA PERCENT PACED: 23.6 %
MDC IDC STAT BRADY RV PERCENT PACED: 15.02 %

## 2017-06-19 ENCOUNTER — Ambulatory Visit (INDEPENDENT_AMBULATORY_CARE_PROVIDER_SITE_OTHER): Payer: Medicare Other | Admitting: Internal Medicine

## 2017-06-19 ENCOUNTER — Encounter: Payer: Self-pay | Admitting: Internal Medicine

## 2017-06-19 VITALS — BP 102/80 | HR 72 | Ht 60.0 in | Wt 176.0 lb

## 2017-06-19 DIAGNOSIS — Z720 Tobacco use: Secondary | ICD-10-CM | POA: Diagnosis not present

## 2017-06-19 DIAGNOSIS — I519 Heart disease, unspecified: Secondary | ICD-10-CM

## 2017-06-19 DIAGNOSIS — I255 Ischemic cardiomyopathy: Secondary | ICD-10-CM | POA: Diagnosis not present

## 2017-06-19 LAB — CUP PACEART INCLINIC DEVICE CHECK
Battery Voltage: 2.92 V
Brady Statistic AS VS Percent: 1.29 %
Brady Statistic RA Percent Paced: 15.17 %
HIGH POWER IMPEDANCE MEASURED VALUE: 89 Ohm
Implantable Lead Implant Date: 20150416
Implantable Lead Implant Date: 20150922
Implantable Lead Location: 753858
Implantable Lead Location: 753859
Implantable Lead Location: 753860
Implantable Lead Model: 4598
Implantable Lead Model: 5076
Implantable Lead Model: 6935
Implantable Pulse Generator Implant Date: 20150416
Lead Channel Impedance Value: 266 Ohm
Lead Channel Impedance Value: 304 Ohm
Lead Channel Impedance Value: 494 Ohm
Lead Channel Impedance Value: 513 Ohm
Lead Channel Impedance Value: 703 Ohm
Lead Channel Pacing Threshold Amplitude: 0.875 V
Lead Channel Pacing Threshold Amplitude: 3.25 V
Lead Channel Sensing Intrinsic Amplitude: 2.875 mV
Lead Channel Sensing Intrinsic Amplitude: 6.625 mV
Lead Channel Setting Pacing Amplitude: 2 V
Lead Channel Setting Pacing Amplitude: 3.25 V
Lead Channel Setting Pacing Pulse Width: 1 ms
MDC IDC LEAD IMPLANT DT: 20150922
MDC IDC MSMT BATTERY REMAINING LONGEVITY: 18 mo
MDC IDC MSMT LEADCHNL LV IMPEDANCE VALUE: 304 Ohm
MDC IDC MSMT LEADCHNL LV IMPEDANCE VALUE: 323 Ohm
MDC IDC MSMT LEADCHNL LV IMPEDANCE VALUE: 342 Ohm
MDC IDC MSMT LEADCHNL LV IMPEDANCE VALUE: 494 Ohm
MDC IDC MSMT LEADCHNL LV IMPEDANCE VALUE: 665 Ohm
MDC IDC MSMT LEADCHNL LV IMPEDANCE VALUE: 665 Ohm
MDC IDC MSMT LEADCHNL LV PACING THRESHOLD AMPLITUDE: 2.75 V
MDC IDC MSMT LEADCHNL LV PACING THRESHOLD PULSEWIDTH: 0.8 ms
MDC IDC MSMT LEADCHNL LV PACING THRESHOLD PULSEWIDTH: 1 ms
MDC IDC MSMT LEADCHNL RA IMPEDANCE VALUE: 380 Ohm
MDC IDC MSMT LEADCHNL RA PACING THRESHOLD AMPLITUDE: 0.625 V
MDC IDC MSMT LEADCHNL RA PACING THRESHOLD PULSEWIDTH: 0.4 ms
MDC IDC MSMT LEADCHNL RA SENSING INTR AMPL: 5.25 mV
MDC IDC MSMT LEADCHNL RV IMPEDANCE VALUE: 399 Ohm
MDC IDC MSMT LEADCHNL RV PACING THRESHOLD PULSEWIDTH: 0.4 ms
MDC IDC MSMT LEADCHNL RV SENSING INTR AMPL: 8.375 mV
MDC IDC SESS DTM: 20190503184016
MDC IDC SET LEADCHNL RA PACING AMPLITUDE: 2 V
MDC IDC SET LEADCHNL RV PACING PULSEWIDTH: 0.4 ms
MDC IDC SET LEADCHNL RV SENSING SENSITIVITY: 0.3 mV
MDC IDC STAT BRADY AP VP PERCENT: 14.99 %
MDC IDC STAT BRADY AP VS PERCENT: 0.26 %
MDC IDC STAT BRADY AS VP PERCENT: 83.45 %
MDC IDC STAT BRADY RV PERCENT PACED: 6.68 %

## 2017-06-19 MED ORDER — METOPROLOL SUCCINATE ER 50 MG PO TB24: 50.0000 mg | ORAL_TABLET | Freq: Every day | ORAL | 3 refills | Status: DC

## 2017-06-19 NOTE — Progress Notes (Signed)
PCP: Becky Person, MD Primary Cardiologist:  Dr Wyline Mood Primary EP: Dr Lafayette Dragon RENLEIGH [name] is a 67 y.o. female who presents today for routine electrophysiology followup.  Since last being seen in our clinic, the patient reports doing reasonably well. She has had some worsening SOB over the past few months.  Continues to smoke and has COPD.  She has had worsening fatigue over the past 2 months.  She states "I feel like I might need a stent".  Dr Wyline Mood is following closely.  Today, she denies symptoms of palpitations, chest pain,  lower extremity edema, dizziness, presyncope, syncope, or ICD shocks.  The patient is otherwise without complaint today.   Past Medical History:  Diagnosis Date  . AICD (automatic cardioverter/defibrillator) present   . Anxiety   . Arthritis    "all my joints; neck, back, legs, arms, elbows"  (10/03/2015)  . Asthma   . CAD (coronary artery disease)    a. 5/08: s/p DES to PDA and DES to LAD; b. 1/09: s/p DES to pLAD; c. 1/11: s/p DES to LAD x 2, d. LHC (3/12 in [place] - EF 55%, mild ant HK, nl LM, LAD stents ok, oLAD 30, RCA stents ok;  e.  4/13:  s/p DES to RCA, f. 1/14: dLM 20-30, mOM3 20-30, mLAD 80 ISR => s/p 2.5x16 mm Promus Element DES; EF 45-50%   . CHF (congestive heart failure) (HCC)   . Chronic bronchitis (HCC)   . Chronic lower back pain    "into pelvis region" (10/03/2015)  . COPD (chronic obstructive pulmonary disease) (HCC)   . Daily headache    "might be from sinus" (10/03/2015)  . Depression    hx (10/03/2015)  . Fibromyalgia   . GERD (gastroesophageal reflux disease)   . Heart murmur   . HLD (hyperlipidemia)   . Hypertension   . Ischemic cardiomyopathy    a. Echo (11/14):  EF 25%   . LBBB (left bundle branch block)   . Myocardial infarction (HCC) 12/2012  . OSA on CPAP   . Pneumonia 1950s  . Syncope 11/14   LifeVest placed  . Upper back pain, chronic    "into my shoulders; fibromyalgia works here" (10/03/2015)   Past  Surgical History:  Procedure Laterality Date  . BI-VENTRICULAR IMPLANTABLE CARDIOVERTER DEFIBRILLATOR N/A 06/02/2013   Procedure: BI-VENTRICULAR IMPLANTABLE CARDIOVERTER DEFIBRILLATOR  (CRT-D);  Surgeon: Gardiner Rhyme, MD;  Location[facility] CATH LAB;  Service: Cardiovascular;  Laterality: N/A;  . BI-VENTRICULAR IMPLANTABLE CARDIOVERTER DEFIBRILLATOR  (CRT-D)  06/02/2013   MDT VivaQuad CRTD implanted by Dr Johney Frame for ICM, CHF  . CARDIAC CATHETERIZATION  12/2012   S/P MI  . CORONARY ANGIOPLASTY WITH STENT PLACEMENT  2007; 2009; 2011   "?2 +1 +1 "  . CORONARY ANGIOPLASTY WITH STENT PLACEMENT  02/2012  . FRACTURE SURGERY    . HYSTEROSCOPY W/ ENDOMETRIAL ABLATION  ~ 1998  . LEAD REVISION  11-08-13   RA and LV lead revision by Dr Johney Frame  . LEAD REVISION Left 11/08/2013   Procedure: LEAD REVISION;  Surgeon: Gardiner Rhyme, MD;  Location: [facility] CATH LAB;  Service: Cardiovascular;  Laterality: Left;  . LEFT HEART CATHETERIZATION WITH CORONARY ANGIOGRAM N/A 01/03/2013   Procedure: LEFT HEART CATHETERIZATION WITH CORONARY ANGIOGRAM;  Surgeon: Kathleene Hazel, MD;  Location: [facility] CATH LAB;  Service: Cardiovascular;  Laterality: N/A;  . ORIF ANKLE FRACTURE Right 10/21/2009  . TUBAL LIGATION  1989    ROS- all systems are reviewed  and negative except as per HPI above  Current Outpatient Medications  Medication Sig Dispense Refill  . acetaminophen-codeine (TYLENOL #3) 300-30 MG tablet Take 1 tablet by mouth every 6 (six) hours as needed for moderate pain.   2  . Aspirin-Salicylamide-Caffeine (BC HEADACHE PO) Take 1 each by mouth as needed.     . budesonide (PULMICORT) 0.5 MG/2ML nebulizer solution Take 0.5 mg by nebulization 2 (two) times daily.    . calcium carbonate (CALCIUM 600) 1500 (600 Ca) MG TABS tablet Take 600 mg of elemental calcium by mouth daily with breakfast.    . Cholecalciferol (VITAMIN D) 2000 UNITS CAPS Take 2,000 Units by mouth daily.    . clopidogrel (PLAVIX) 75 MG tablet Take 75 mg by  mouth daily.     Marland Kitchen Dextromethorphan-Guaifenesin (MUCINEX DM) 30-600 MG TB12 Take 1 tablet by mouth 2 (two) times daily as needed.  0  . escitalopram (LEXAPRO) 20 MG tablet Take 20 mg by mouth daily.     . fluticasone (FLONASE) 50 MCG/ACT nasal spray Place 2 sprays into both nostrils daily.  7  . formoterol (PERFOROMIST) 20 MCG/2ML nebulizer solution Take 20 mcg by nebulization 2 (two) times daily.    . furosemide (LASIX) 40 MG tablet TAKE 1 AND 1/2 TABLET BY MOUTH DAILY. MAY TAKE EXTRA 1 AND 1/2 TABLET AS NEEDED 90 tablet 1  . ipratropium-albuterol (DUONEB) 0.5-2.5 (3) MG/3ML SOLN Take 3 mLs by nebulization every 6 (six) hours as needed (shortness of breath).    . lactulose (CHRONULAC) 10 GM/15ML solution Take 10 g by mouth 2 (two) times daily as needed for mild constipation.    . magnesium oxide (MAG-OX) 400 MG tablet Take 400 mg by mouth daily.    . meclizine (ANTIVERT) 25 MG tablet Take 1 tablet by mouth 2 (two) times daily as needed.  3  . metFORMIN (GLUCOPHAGE) 500 MG tablet Take 500 mg by mouth 2 (two) times daily with a meal.    . metoprolol succinate (TOPROL-XL) 100 MG 24 hr tablet TAKE 1 TABLET(100 MG) BY MOUTH DAILY 90 tablet 3  . montelukast (SINGULAIR) 10 MG tablet Take 10 mg by mouth every morning.     . nitroGLYCERIN (NITROSTAT) 0.4 MG SL tablet Place 1 tablet (0.4 mg total) under the tongue every 5 (five) minutes x 3 doses as needed. If no relief after 3rd dose, proceed to ED 25 tablet 5  . pantoprazole (PROTONIX) 40 MG tablet TAKE 1 TABLET BY MOUTH DAILY 90 tablet 3  . potassium chloride SA (K-DUR,KLOR-CON) 20 MEQ tablet TAKE 1 TABLET BY MOUTH EVERY OTHER DAY 45 tablet 0  . pravastatin (PRAVACHOL) 40 MG tablet Take 40 mg by mouth daily.    Marland Kitchen rOPINIRole (REQUIP) 0.5 MG tablet Take 0.5 mg by mouth at bedtime.     Marland Kitchen spironolactone (ALDACTONE) 25 MG tablet TAKE 1/2 TABLET BY MOUTH DAILY 45 tablet 3   No current facility-administered medications for this visit.     Physical  Exam: Vitals:   06/19/17 1412  BP: 102/80  Pulse: 72  SpO2: 98%  Weight: 176 lb (79.8 kg)  Height: 5' (1.524 m)    GEN- The patient is well appearing, alert and oriented x 3 today.   Head- normocephalic, atraumatic Eyes-  Sclera clear, conjunctiva pink Ears- hearing intact Oropharynx- clear Lungs- Clear to ausculation bilaterally, normal work of breathing Chest- ICD pocket is well healed Heart- Regular rate and rhythm, no murmurs, rubs or gallops, PMI not laterally displaced GI- soft,  NT, ND, + BS Extremities- no clubbing, cyanosis, or edema  ICD interrogation- reviewed in detail today,  See PACEART report  ekg tracing ordered today is personally reviewed and shows sinus with BiV pacing  Assessment and Plan:  1.  Chronic systolic dysfunction/ ischemic CM euvolemic today She has had normalization of her EF with CRT. Stable on an appropriate medical regimen Normal BiV ICD function, though her chronically elevated LV threshold is a little higher today. I have turned adaptive LV management on today (0.5V safety margin)  See Pace Art report No changes today She has fatigue and states that she feels "like I need a stent". Denies CP or SOB above baseline. Will reduce toprol to 50mg  daily Dr Wyline Mood to see again in June to reassess.   2. Obesity Body mass index is 34.37 kg/m. Wt Readings from Last 3 Encounters:  06/19/17 176 lb (79.8 kg)  04/13/17 185 lb 9.6 oz (84.2 kg)  10/10/16 196 lb 9.6 oz (89.2 kg)   3. Tobacco Cessation advised She does not wish to quit  4. CAD As per HPI Follow-up with Dr Wyline Mood in June Will try lower dose toprol in the interim  5. OSA Compliant with CPAP  [facility] Return to see me in a year  Hillis Range MD, [facility] 06/19/2017 2:43 PM

## 2017-06-19 NOTE — Patient Instructions (Addendum)
Medication Instructions:   Your physician has recommended you make the following change in your medication:   Decrease toprol xl to 50 mg by mouth daily. You may break your 100 mg tablets in half daily until they are finished.  Continue all other medications the same.  Labwork:  NONE  Testing/Procedures:  NONE  Follow-Up: Your physician recommends that you schedule a follow-up appointment in: 1 year with Dr. Johney Frame. Please schedule this appointment today before leaving the office. Your next device check from home is on 08/18/17.   Any Other Special Instructions Will Be Listed Below (If Applicable). Your physician discussed the hazards of tobacco use. Tobacco use cessation is recommended and techniques and options to help you quit were discussed.   If you need a refill on your cardiac medications before your next appointment, please call your pharmacy.

## 2017-07-20 ENCOUNTER — Ambulatory Visit (INDEPENDENT_AMBULATORY_CARE_PROVIDER_SITE_OTHER): Payer: Medicare Other

## 2017-07-20 DIAGNOSIS — I5022 Chronic systolic (congestive) heart failure: Secondary | ICD-10-CM | POA: Diagnosis not present

## 2017-07-20 DIAGNOSIS — Z95810 Presence of automatic (implantable) cardiac defibrillator: Secondary | ICD-10-CM | POA: Diagnosis not present

## 2017-07-20 NOTE — Progress Notes (Signed)
EPIC Encounter for ICM Monitoring  Patient Name: Becky [name] is a 67 y.o. female Date: 07/20/2017 Primary Care Physican: Joseph Art, MD Primary Cardiologist:Branch Electrophysiologist: Allred Dry Weight:179lbs  Bi-V Pacing: 97.3%       Heart Failure questions reviewed, pt asymptomatic for fluid symptoms.  She had fluids in ER during decreased impedance.    Thoracic impedance normal.   Prescribed dosage: Furosemide 40 mg 1.5 tablets (60 mg total) daily and may take an extra 60 mg as needed. Potassium 20 mEq 1 tablet every other day.   Labs:  01/03/2017 Creatinine 1.09, BUN 18. Potassium 3.8, Sodium 140, EGFR 53-61 05/05/2016 Creatinine 1.05, BUN 19, Potassium 3.6, Sodium 135, EGFR 64 05/04/2016 Creatinine 0.93, BUN 21, Potassium 3.0, Sodium 133, EGFR 75 05/03/2016 Creatinine 0.93, BUN 24, Potassium 3.5, Sodium 134, EGFR 75 03/11/2016 Creatinine 0.98, BUN 17, Potassium 2.9, Sodium 136  Recommendations: No changes.   Encouraged to call for fluid symptoms.  Follow-up plan: ICM clinic phone appointment on 08/21/2017.  Office appointment with Dr Harl Bowie on 08/05/2017.  Copy of ICM check sent to Dr. Rayann Heman.   3 month ICM trend: 07/20/2017    1 Year ICM trend:       Rosalene Billings, RN 07/20/2017 2:06 PM

## 2017-07-21 ENCOUNTER — Other Ambulatory Visit: Payer: Self-pay | Admitting: Cardiology

## 2017-08-05 ENCOUNTER — Ambulatory Visit: Payer: Medicare Other | Admitting: Cardiology

## 2017-08-05 NOTE — Progress Notes (Deleted)
Clinical Summary Becky [name] is a 67 y.o.female seen today for follow up of the following medical problems.  1. ICM/Chronic systolic heart failure - history of multiple PCIs as described below, mainly in [place].  - 04/2013 LVEF 30-35%. Repeat echo after BiV AICD 05/2014 shows LVEF 62%, grade I diastolic dysfunction.  - 09/2015 echo LVEF 55-60%, no WMAs, grade I diastolic dysfunction - 22/9798 echo [place]: LVEF 65-70%, moderate LVH.  - medical therapy has been limited by orthostatic symtpoms -she has been on long term plavix due to her extensive stent burden     - no recent SOB/DOE. No recent chest pain - compliant with meds.  - no recent orthostatioc symptoms   - fatigue, Toprol decreased at last EP visit -    2. OSA   - compliant with cpap, followed by Dr Marjory Lies in [place], [place].   3. Hyperlipidemia - muscle aches on multiple statins - tolerating pravastatin   - continue to be compliant with meds  4. HTN -has not taken meds yet today, but overall compliant  5. COPD - followed bypulmonary in [place]  - followed by Dr Luciana Axe    Past Medical History:  Diagnosis Date  . AICD (automatic cardioverter/defibrillator) present   . Anxiety   . Arthritis    "all my joints; neck, back, legs, arms, elbows"  (10/03/2015)  . Asthma   . CAD (coronary artery disease)    a. 5/08: s/p DES to PDA and DES to LAD; b. 1/09: s/p DES to pLAD; c. 1/11: s/p DES to LAD x 2, d. LHC (3/12 in [place] - EF 55%, mild ant HK, nl LM, LAD stents ok, oLAD 30, RCA stents ok;  e.  4/13:  s/p DES to RCA, f. 1/14: dLM 20-30, mOM3 20-30, mLAD 80 ISR => s/p 2.5x16 mm Promus Element DES; EF 45-50%   . CHF (congestive heart failure) ([place])   . Chronic bronchitis ([place])   . Chronic lower back pain    "into pelvis region" (10/03/2015)  . COPD (chronic obstructive pulmonary disease) ([place])   . Daily headache    "might be from sinus" (10/03/2015)  . Depression    hx (10/03/2015)  . Fibromyalgia   . GERD (gastroesophageal reflux disease)   . Heart murmur   . HLD (hyperlipidemia)   . Hypertension   . Ischemic cardiomyopathy    a. Echo (11/14):  EF 25%   . LBBB (left bundle Baili Stang block)   . Myocardial infarction ([place]) 12/2012  . OSA on CPAP   . Pneumonia 1950s  . Syncope 11/14   LifeVest placed  . Upper back pain, chronic    "into my shoulders; fibromyalgia works here" (10/03/2015)     Allergies  Allergen Reactions  . Tape Rash and Other (See Comments)    Severe Reaction (latex tape): Burned hand, Redness-took days to clear up (03/02/12)  . Codeine Nausea And Vomiting  . Prozac [Fluoxetine Hcl] Other (See Comments)    Mean/violent thoughts  . Fosamax [Alendronate Sodium] Nausea Only and Other (See Comments)    Stomach pain  . Keflex [Cephalexin] Nausea Only  . Lipitor [Atorvastatin] Other (See Comments)    Leg pain     Current Outpatient Medications  Medication Sig Dispense Refill  . acetaminophen-codeine (TYLENOL #3) 300-30 MG tablet Take 1 tablet by mouth every 6 (six) hours as needed for moderate pain.   2  . Aspirin-Salicylamide-Caffeine (BC HEADACHE PO) Take 1 each by mouth as needed.     Marland Kitchen  budesonide (PULMICORT) 0.5 MG/2ML nebulizer solution Take 0.5 mg by nebulization 2 (two) times daily.    . calcium carbonate (CALCIUM 600) 1500 (600 Ca) MG TABS tablet Take 600 mg of elemental calcium by mouth daily with breakfast.    . Cholecalciferol (VITAMIN D) 2000 UNITS CAPS Take 2,000 Units by mouth daily.    . clopidogrel (PLAVIX) 75 MG tablet Take 75 mg by mouth daily.     Marland Kitchen Dextromethorphan-Guaifenesin (MUCINEX DM) 30-600 MG TB12 Take 1 tablet by mouth 2 (two) times daily as needed.  0  . escitalopram (LEXAPRO) 20 MG tablet Take 20 mg by mouth daily.     . fluticasone (FLONASE) 50 MCG/ACT nasal spray Place 2 sprays into both nostrils daily.  7  . formoterol (PERFOROMIST) 20 MCG/2ML nebulizer solution Take 20 mcg by nebulization 2 (two)  times daily.    . furosemide (LASIX) 40 MG tablet TAKE 1 AND 1/2 TABLET BY MOUTH DAILY. MAY TAKE EXTRA 1 AND 1/2 TABLET AS NEEDED 90 tablet 1  . ipratropium-albuterol (DUONEB) 0.5-2.5 (3) MG/3ML SOLN Take 3 mLs by nebulization every 6 (six) hours as needed (shortness of breath).    . lactulose (CHRONULAC) 10 GM/15ML solution Take 10 g by mouth 2 (two) times daily as needed for mild constipation.    . magnesium oxide (MAG-OX) 400 MG tablet Take 400 mg by mouth daily.    . meclizine (ANTIVERT) 25 MG tablet Take 1 tablet by mouth 2 (two) times daily as needed.  3  . metFORMIN (GLUCOPHAGE) 500 MG tablet Take 500 mg by mouth 2 (two) times daily with a meal.    . metoprolol succinate (TOPROL XL) 50 MG 24 hr tablet Take 1 tablet (50 mg total) by mouth daily. Take with or immediately following a meal. 90 tablet 3  . montelukast (SINGULAIR) 10 MG tablet Take 10 mg by mouth every morning.     . nitroGLYCERIN (NITROSTAT) 0.4 MG SL tablet Place 1 tablet (0.4 mg total) under the tongue every 5 (five) minutes x 3 doses as needed. If no relief after 3rd dose, proceed to ED 25 tablet 5  . pantoprazole (PROTONIX) 40 MG tablet TAKE 1 TABLET BY MOUTH DAILY 90 tablet 3  . potassium chloride SA (K-DUR,KLOR-CON) 20 MEQ tablet TAKE 1 TABLET BY MOUTH EVERY OTHER DAY 45 tablet 0  . pravastatin (PRAVACHOL) 40 MG tablet Take 40 mg by mouth daily.    Marland Kitchen rOPINIRole (REQUIP) 0.5 MG tablet Take 0.5 mg by mouth at bedtime.     Marland Kitchen spironolactone (ALDACTONE) 25 MG tablet TAKE 1/2 TABLET BY MOUTH DAILY 45 tablet 3   No current facility-administered medications for this visit.      Past Surgical History:  Procedure Laterality Date  . BI-VENTRICULAR IMPLANTABLE CARDIOVERTER DEFIBRILLATOR N/A 06/02/2013   Procedure: BI-VENTRICULAR IMPLANTABLE CARDIOVERTER DEFIBRILLATOR  (CRT-D);  Surgeon: Coralyn Mark, MD;  Location: [facility] CATH LAB;  Service: Cardiovascular;  Laterality: N/A;  . BI-VENTRICULAR IMPLANTABLE CARDIOVERTER DEFIBRILLATOR   (CRT-D)  06/02/2013   MDT VivaQuad CRTD implanted by Dr Rayann Heman for ICM, CHF  . CARDIAC CATHETERIZATION  12/2012   S/P MI  . CORONARY ANGIOPLASTY WITH STENT PLACEMENT  2007; 2009; 2011   "?2 +1 +1 "  . CORONARY ANGIOPLASTY WITH STENT PLACEMENT  02/2012  . FRACTURE SURGERY    . HYSTEROSCOPY W/ ENDOMETRIAL ABLATION  ~ 1998  . LEAD REVISION  11-08-13   RA and LV lead revision by Dr Rayann Heman  . LEAD REVISION Left 11/08/2013  Procedure: LEAD REVISION;  Surgeon: Coralyn Mark, MD;  Location: [facility] CATH LAB;  Service: Cardiovascular;  Laterality: Left;  . LEFT HEART CATHETERIZATION WITH CORONARY ANGIOGRAM N/A 01/03/2013   Procedure: LEFT HEART CATHETERIZATION WITH CORONARY ANGIOGRAM;  Surgeon: Burnell Blanks, MD;  Location: [facility] CATH LAB;  Service: Cardiovascular;  Laterality: N/A;  . ORIF ANKLE FRACTURE Right 10/21/2009  . TUBAL LIGATION  1989     Allergies  Allergen Reactions  . Tape Rash and Other (See Comments)    Severe Reaction (latex tape): Burned hand, Redness-took days to clear up (03/02/12)  . Codeine Nausea And Vomiting  . Prozac [Fluoxetine Hcl] Other (See Comments)    Mean/violent thoughts  . Fosamax [Alendronate Sodium] Nausea Only and Other (See Comments)    Stomach pain  . Keflex [Cephalexin] Nausea Only  . Lipitor [Atorvastatin] Other (See Comments)    Leg pain      Family History  Problem Relation Age of Onset  . Breast cancer Mother   . Heart failure Brother   . Breast cancer Sister   . Breast cancer Sister   . Uterine cancer Sister      Social History Ms. Velarde reports that she has been smoking cigarettes.  She started smoking about 26 years ago. She has a 10.00 pack-year smoking history. She has never used smokeless tobacco. Ms. Langsam reports that she drinks alcohol.   Review of Systems CONSTITUTIONAL: No weight loss, fever, chills, weakness or fatigue.  HEENT: Eyes: No visual loss, blurred vision, double vision or yellow sclerae.No hearing loss,  sneezing, congestion, runny nose or sore throat.  SKIN: No rash or itching.  CARDIOVASCULAR:  RESPIRATORY: No shortness of breath, cough or sputum.  GASTROINTESTINAL: No anorexia, nausea, vomiting or diarrhea. No abdominal pain or blood.  GENITOURINARY: No burning on urination, no polyuria NEUROLOGICAL: No headache, dizziness, syncope, paralysis, ataxia, numbness or tingling in the extremities. No change in bowel or bladder control.  MUSCULOSKELETAL: No muscle, back pain, joint pain or stiffness.  LYMPHATICS: No enlarged nodes. No history of splenectomy.  PSYCHIATRIC: No history of depression or anxiety.  ENDOCRINOLOGIC: No reports of sweating, cold or heat intolerance. No polyuria or polydipsia.  Marland Kitchen   Physical Examination There were no vitals filed for this visit. There were no vitals filed for this visit.  Gen: resting comfortably, no acute distress HEENT: no scleral icterus, pupils equal round and reactive, no palptable cervical adenopathy,  CV Resp: Clear to auscultation bilaterally GI: abdomen is soft, non-tender, non-distended, normal bowel sounds, no hepatosplenomegaly MSK: extremities are warm, no edema.  Skin: warm, no rash Neuro:  no focal deficits Psych: appropriate affect   Diagnostic Studies 04/2010 Cath [place], [place]: LVEF 55%, mild anterior hypokinesis. LM normal, LAD with stents in prox and mid portion widely patent, LAD ostial 30%. Diags with luminal irregs. LCX with luminial irregs. RCA with distal patent stents  Stent cards May 2008 DES to PDA, DES to LAD  Jan 2009 DES to prox LAD  Mar 05 2009 DES to LAD x2  05/2011 DES to RCA  Jan 2014 stent done [place], [place]  03/2010 Echo: LVEF 40%, hypokinesis of the anteroseptum.   01/02/13 Cath Hemodynamic Findings: Central aortic pressure: 147/79  Left ventricular pressure: 130/22/26  Angiographic Findings: Left main: 30% distal stenosis. It appears that the distal left main has a stent that  continues into the LAD.  Left Anterior Descending Artery: Large vessel that courses to the apex. The entire proximal and mid LAD is  stented. There is mild stent restenosis in the proximal segment. The mid stented segment has diffuse 30% stent restenosis. The distal vessel becomes small in caliber and has mild diffuse plaque. There is a moderate caliber first diagonal Amauris Debois with mild plaque disease.  Circumflex Artery: Moderate caliber non-dominant vessel with three moderate caliber obtuse marginal branches. No obstructive disease.  Right Coronary Artery: Large dominant vessel with 30% proximal stenosis, heavily calcified mid vessel with patent stent in the mid vessel (no significant restenosis). The distal vessel has diffuse 40-50% stenosis. The posterolateral Marlin Jarrard and PDA are moderate caliber, patent vessels with mild plaque disease.  Left Ventricular Angiogram: LVEF=25-30%. Hypokinesis of the antero-apical wall.  Impression:  1. 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 Elevated troponin following syncopal event. No culprit lesions seen.  3. Moderate to severe LV systolic dysfunction.  Recommendations: Continue medical management of CAD. Workup for syncope is underway. Carotid dopplers today. Will check d-dimer as PE is a possibility. She could also have had an arrythmia with LV dysfunction.  Complications: None. The patient tolerated the procedure well.  01/03/13 Echo - LVEF 25-30%, akinesis of the anteroseptal and apical myocardium, grade I diastolic dysfunction,  86/76/19 Carotid US - The vertebral arteries appear patent with antegrade flow. - Findings consistent with1- 39 percent stenosis,high end of scale,involving the right internal carotid artery. - Findings consistent with 40 - 59 percent stenosis, low end of scale,involving the left internal carotid artery. - ICA/CCA ratio. right =1.72. left = 1.52 Other specific details can be found in the table(s)  above. Prepared and Electronically Authenticated by 12/13/12 Event monitor No symptoms reported, sinus rhythm with conduction delay, occasional PVCs.  04/2013 Echo Study Conclusions  - Left ventricle: The cavity size was normal. Wall thickness was increased in a pattern of mild LVH with moderate basal septal hypertrophy. Systolic function was moderately to severely reduced. The estimated ejection fraction was in the range of 30% to 35%. There is akinesis to dyskinesis of the mid-distal anteroseptal and apical myocardium. There is akinesis of the distalinferoseptal myocardium. Doppler parameters are consistent with abnormal left ventricular relaxation (grade 1 diastolic dysfunction). - Ventricular septum: Septal motion showed abnormal function and dyssynergy. - Aortic valve: Mildly calcified annulus. Probably trileaflet; mildly calcified leaflets. No significant regurgitation. - Mitral valve: Calcified annulus. Trivial regurgitation. - Left atrium: The atrium was mildly dilated. - Right atrium: Central venous pressure: 103m Hg (est). - Tricuspid valve: Trivial regurgitation. - Pulmonary arteries: PA peak pressure: 25mHg (S). - Pericardium, extracardiac: There was no pericardial effusion. Impressions:  - [place] moderate basal septal hypertrophy, LVEF 30-35% with wall motion abnormalities as outlined, grade 1 diastolic dysfunction. Mild left atrial enlagement. MAC with trivial mitral regurgitation. MIldlysclerotic aortic valve. Trivial tricuspid regurgitation with normal PASP 20 mmHg. 05/2014 echo Study Conclusions  - Left ventricle: Technically limited study. The cavity size was normal. Wall thickness was increased in a pattern of moderate LVH. The estimated ejection fraction was 55%. Doppler parameters are consistent with abnormal left ventricular relaxation (grade 1 diastolic dysfunction). - Aortic valve: Sclerosis without stenosis. - Right ventricle:  The cavity size was normal. Pacer wire or catheter noted in right ventricle. Systolic function was normal. - Impressions: Since the study in 2015, there is definite improvement in LV function.  Impressions:  - Since the study in 2015, there is definite improvement in LV function.   09/2015 Nuclear Stress  There was no ST segment deviation noted during stress.  No T wave inversion was noted during stress.  Defect 1: There is a medium defect of mild severity present in the basal inferior and mid inferior location. This defect is likely a result of bowel loop attenuation artifact. No significant ischemia identified.  This is a low risk study. No significant ischemia identified.  Nuclear stress EF: 54%. Mid to apical septal wall hypokinesis noted   09/2015 echo Study Conclusions  - Left ventricle: The cavity size was normal. Systolic function was normal. The estimated ejection fraction was in the range of 55% to 60%. Wall motion was normal; there were no regional wall motion abnormalities. Doppler parameters are consistent with abnormal left ventricular relaxation (grade 1 diastolic dysfunction). Doppler parameters are consistent with indeterminate ventricular filling pressure. - Aortic valve: Transvalvular velocity was within the normal range. There was no stenosis. There was no regurgitation. - Mitral valve: Transvalvular velocity was within the normal range. There was no evidence for stenosis. There was no regurgitation. - Left atrium: The atrium was moderately dilated. - Right ventricle: The cavity size was normal. Wall thickness was normal. Systolic function was normal. - Tricuspid valve: There was no regurgitation.        Assessment and Plan  1. ICM/Chronic systolic heart failure  - previous LVEF 30-35%. After BiV AICD repeat echo with normalized LVEF - medical therapy has been limited due to orthstatic symptoms - indefinite plavix  due to large stent burden  - no symptoms, she will continue current meds  2. OSA - she will continue CPAP  3. Hyperlipidemia - difficulty tolerating statins in the past, has tolerated pravastatin - continue current statin  4. HTN - mildly elevated, has not taken meds yet today - continue to monitor.         Arnoldo Lenis, M.D., F.A.C.C.

## 2017-08-06 ENCOUNTER — Encounter: Payer: Self-pay | Admitting: Cardiology

## 2017-08-21 ENCOUNTER — Ambulatory Visit (INDEPENDENT_AMBULATORY_CARE_PROVIDER_SITE_OTHER): Payer: Medicare Other

## 2017-08-21 ENCOUNTER — Telehealth: Payer: Self-pay

## 2017-08-21 DIAGNOSIS — Z95810 Presence of automatic (implantable) cardiac defibrillator: Secondary | ICD-10-CM | POA: Diagnosis not present

## 2017-08-21 DIAGNOSIS — I5022 Chronic systolic (congestive) heart failure: Secondary | ICD-10-CM | POA: Diagnosis not present

## 2017-08-21 NOTE — Progress Notes (Signed)
EPIC Encounter for ICM Monitoring  Patient Name: Becky [name] is a 67 y.o. female Date: 08/21/2017 Primary Care Physican: Joseph Art, MD Primary Cardiologist:Branch Electrophysiologist: Allred Dry Weight:175lbs  Bi-V Pacing: 94.8%      Heart Failure questions reviewed, pt asymptomatic.   Thoracic impedance abnormal suggesting fluid accumulation starting 08/16/2017.  Prescribed dosage: Furosemide 40 mg 1.5 tablets (60 mg total) daily and may take an extra 60 mg as needed. Potassium 20 mEq 1 tablet every other day.   Labs:  01/03/2017 Creatinine 1.09, BUN 18. Potassium 3.8, Sodium 140, EGFR 53-61 05/05/2016 Creatinine 1.05, BUN 19, Potassium 3.6, Sodium 135, EGFR 64 05/04/2016 Creatinine 0.93, BUN 21, Potassium 3.0, Sodium 133, EGFR 75 05/03/2016 Creatinine 0.93, BUN 24, Potassium 3.5, Sodium 134, EGFR 75 03/11/2016 Creatinine 0.98, BUN 17, Potassium 2.9, Sodium 136  Recommendations: Advised to take extra 60 mg Furosemide as prescribed for 3 days and then return to prescribed dosage.  Encouraged to call for fluid symptoms.  Follow-up plan: ICM clinic phone appointment on 08/25/2017 (manual send) to recheck fluid levels.    Appointments: Office appointment scheduled 06/18/2018 with Dr. Rayann Heman.  Copy of ICM check sent to Dr. Rayann Heman and Dr. Harl Bowie.   3 month ICM trend: 08/21/2017    1 Year ICM trend:       Rosalene Billings, RN 08/21/2017 11:06 AM

## 2017-08-21 NOTE — Telephone Encounter (Signed)
Remote ICM transmission received.  Attempted call to patient and left message, per DPR, to return call regarding transmission.    

## 2017-08-25 ENCOUNTER — Telehealth: Payer: Self-pay | Admitting: Cardiology

## 2017-08-25 ENCOUNTER — Ambulatory Visit (INDEPENDENT_AMBULATORY_CARE_PROVIDER_SITE_OTHER): Payer: Self-pay

## 2017-08-25 DIAGNOSIS — Z95810 Presence of automatic (implantable) cardiac defibrillator: Secondary | ICD-10-CM

## 2017-08-25 DIAGNOSIS — I5022 Chronic systolic (congestive) heart failure: Secondary | ICD-10-CM

## 2017-08-25 NOTE — Telephone Encounter (Signed)
LMOVM reminding pt to send remote transmission.   

## 2017-08-25 NOTE — Progress Notes (Signed)
EPIC Encounter for ICM Monitoring  Patient Name: Becky [name] is a 67 y.o. female Date: 08/25/2017 Primary Care Physican: Joseph Art, MD Primary Cardiologist:Branch Electrophysiologist: Allred Dry Weight: 173 lbs  Bi-V Pacing:  94.9%       Heart Failure questions reviewed, pt asymptomatic.  She lost 2 lbs after taking extra Furosemide. She has been having trouble with the monitor sending transmissions and will call tech services to ask for new monitor.    Thoracic impedance returned to normal after taking extra Furosemide.   Prescribed dosage: Furosemide 40 mg 1.5 tablets (60 mg total) daily and may take an extra 60 mg as needed. Potassium 20 mEq 1 tablet every other day.   Labs: 01/03/2017 Creatinine 1.09, BUN 18. Potassium 3.8, Sodium 140, EGFR 53-61 05/05/2016 Creatinine 1.05, BUN 19, Potassium 3.6, Sodium 135, EGFR 64 05/04/2016 Creatinine 0.93, BUN 21, Potassium 3.0, Sodium 133, EGFR 75 05/03/2016 Creatinine 0.93, BUN 24, Potassium 3.5, Sodium 134, EGFR 75 03/11/2016 Creatinine 0.98, BUN 17, Potassium 2.9, Sodium 136  Recommendations: No changes.  Encouraged to call for fluid symptoms.  Follow-up plan: ICM clinic phone appointment on 09/21/2017.   Office appointment scheduled 06/18/2018 with Dr. Rayann Heman.  Copy of ICM check sent to Dr. Rayann Heman.   3 month ICM trend: 08/25/2017    1 Year ICM trend:       Rosalene Billings, RN 08/25/2017 2:33 PM

## 2017-08-31 ENCOUNTER — Encounter: Payer: Self-pay | Admitting: Cardiology

## 2017-08-31 ENCOUNTER — Ambulatory Visit (INDEPENDENT_AMBULATORY_CARE_PROVIDER_SITE_OTHER): Payer: Medicare Other | Admitting: Cardiology

## 2017-08-31 VITALS — BP 112/69 | HR 64 | Ht 60.0 in | Wt 170.6 lb

## 2017-08-31 DIAGNOSIS — I2510 Atherosclerotic heart disease of native coronary artery without angina pectoris: Secondary | ICD-10-CM | POA: Diagnosis not present

## 2017-08-31 DIAGNOSIS — I10 Essential (primary) hypertension: Secondary | ICD-10-CM | POA: Diagnosis not present

## 2017-08-31 DIAGNOSIS — E782 Mixed hyperlipidemia: Secondary | ICD-10-CM

## 2017-08-31 DIAGNOSIS — I255 Ischemic cardiomyopathy: Secondary | ICD-10-CM

## 2017-08-31 DIAGNOSIS — I5022 Chronic systolic (congestive) heart failure: Secondary | ICD-10-CM

## 2017-08-31 NOTE — Patient Instructions (Signed)

## 2017-08-31 NOTE — Progress Notes (Signed)
Clinical Summary Becky [name] is a 67 y.o.female seen today for follow up of the following medical problems.  1. ICM/Chronic systolic heart failure - history of multiple PCIs as described below, mainly in [place].  - 04/2013 LVEF 30-35%. Repeat echo after BiV AICD 05/2014 shows LVEF 89%, grade I diastolic dysfunction.  - 09/2015 echo LVEF 55-60%, no WMAs, grade I diastolic dysfunction - 21/1941 echo [place]: LVEF 65-70%, moderate LVH.  - medical therapy has been limited by orthostatic symtpoms -she has been on long term plavix due to her extensive stent burden     - some SOB/DOE she associates with sinuses. No recent edema. No chest pain - compliant with meds. Down actually 25 lbs since last year, eating healthier   2. OSA  - compliant with cpap, followed by Dr Marjory Lies in [place], [place].  - reports recentlly they were able to lower the pressure on her cpap due to weight loss.   3. Hyperlipidemia - muscle aches on multiple statins - tolerating pravastatin   - compliant with meds  4. HTN - compliant with meds  5. COPD - followed bypulmonary in [place]  - followed by Dr Luciana Axe    Past Medical History:  Diagnosis Date  . AICD (automatic cardioverter/defibrillator) present   . Anxiety   . Arthritis    "all my joints; neck, back, legs, arms, elbows"  (10/03/2015)  . Asthma   . CAD (coronary artery disease)    a. 5/08: s/p DES to PDA and DES to LAD; b. 1/09: s/p DES to pLAD; c. 1/11: s/p DES to LAD x 2, d. LHC (3/12 in [place] - EF 55%, mild ant HK, nl LM, LAD stents ok, oLAD 30, RCA stents ok;  e.  4/13:  s/p DES to RCA, f. 1/14: dLM 20-30, mOM3 20-30, mLAD 80 ISR => s/p 2.5x16 mm Promus Element DES; EF 45-50%   . CHF (congestive heart failure) ([place])   . Chronic bronchitis ([place])   . Chronic lower back pain    "into pelvis region" (10/03/2015)  . COPD (chronic obstructive pulmonary disease) ([place])   . Daily headache    "might be from  sinus" (10/03/2015)  . Depression    hx (10/03/2015)  . Fibromyalgia   . GERD (gastroesophageal reflux disease)   . Heart murmur   . HLD (hyperlipidemia)   . Hypertension   . Ischemic cardiomyopathy    a. Echo (11/14):  EF 25%   . LBBB (left bundle Kamyrah Feeser block)   . Myocardial infarction ([place]) 12/2012  . OSA on CPAP   . Pneumonia 1950s  . Syncope 11/14   LifeVest placed  . Upper back pain, chronic    "into my shoulders; fibromyalgia works here" (10/03/2015)     Allergies  Allergen Reactions  . Tape Rash and Other (See Comments)    Severe Reaction (latex tape): Burned hand, Redness-took days to clear up (03/02/12)  . Codeine Nausea And Vomiting  . Prozac [Fluoxetine Hcl] Other (See Comments)    Mean/violent thoughts  . Fosamax [Alendronate Sodium] Nausea Only and Other (See Comments)    Stomach pain  . Keflex [Cephalexin] Nausea Only  . Lipitor [Atorvastatin] Other (See Comments)    Leg pain     Current Outpatient Medications  Medication Sig Dispense Refill  . acetaminophen-codeine (TYLENOL #3) 300-30 MG tablet Take 1 tablet by mouth every 6 (six) hours as needed for moderate pain.   2  . Aspirin-Salicylamide-Caffeine (BC HEADACHE PO) Take 1 each  by mouth as needed.     . budesonide (PULMICORT) 0.5 MG/2ML nebulizer solution Take 0.5 mg by nebulization 2 (two) times daily.    . calcium carbonate (CALCIUM 600) 1500 (600 Ca) MG TABS tablet Take 600 mg of elemental calcium by mouth daily with breakfast.    . Cholecalciferol (VITAMIN D) 2000 UNITS CAPS Take 2,000 Units by mouth daily.    . clopidogrel (PLAVIX) 75 MG tablet Take 75 mg by mouth daily.     Marland Kitchen Dextromethorphan-Guaifenesin (MUCINEX DM) 30-600 MG TB12 Take 1 tablet by mouth 2 (two) times daily as needed.  0  . escitalopram (LEXAPRO) 20 MG tablet Take 20 mg by mouth daily.     . fluticasone (FLONASE) 50 MCG/ACT nasal spray Place 2 sprays into both nostrils daily.  7  . formoterol (PERFOROMIST) 20 MCG/2ML nebulizer  solution Take 20 mcg by nebulization 2 (two) times daily.    . furosemide (LASIX) 40 MG tablet TAKE 1 AND 1/2 TABLET BY MOUTH DAILY. MAY TAKE EXTRA 1 AND 1/2 TABLET AS NEEDED 90 tablet 1  . ipratropium-albuterol (DUONEB) 0.5-2.5 (3) MG/3ML SOLN Take 3 mLs by nebulization every 6 (six) hours as needed (shortness of breath).    . lactulose (CHRONULAC) 10 GM/15ML solution Take 10 g by mouth 2 (two) times daily as needed for mild constipation.    . magnesium oxide (MAG-OX) 400 MG tablet Take 400 mg by mouth daily.    . meclizine (ANTIVERT) 25 MG tablet Take 1 tablet by mouth 2 (two) times daily as needed.  3  . metFORMIN (GLUCOPHAGE) 500 MG tablet Take 500 mg by mouth 2 (two) times daily with a meal.    . metoprolol succinate (TOPROL XL) 50 MG 24 hr tablet Take 1 tablet (50 mg total) by mouth daily. Take with or immediately following a meal. 90 tablet 3  . montelukast (SINGULAIR) 10 MG tablet Take 10 mg by mouth every morning.     . nitroGLYCERIN (NITROSTAT) 0.4 MG SL tablet Place 1 tablet (0.4 mg total) under the tongue every 5 (five) minutes x 3 doses as needed. If no relief after 3rd dose, proceed to ED 25 tablet 5  . pantoprazole (PROTONIX) 40 MG tablet TAKE 1 TABLET BY MOUTH DAILY 90 tablet 3  . potassium chloride SA (K-DUR,KLOR-CON) 20 MEQ tablet TAKE 1 TABLET BY MOUTH EVERY OTHER DAY 45 tablet 0  . pravastatin (PRAVACHOL) 40 MG tablet Take 40 mg by mouth daily.    Marland Kitchen rOPINIRole (REQUIP) 0.5 MG tablet Take 0.5 mg by mouth at bedtime.     Marland Kitchen spironolactone (ALDACTONE) 25 MG tablet TAKE 1/2 TABLET BY MOUTH DAILY 45 tablet 3   No current facility-administered medications for this visit.      Past Surgical History:  Procedure Laterality Date  . BI-VENTRICULAR IMPLANTABLE CARDIOVERTER DEFIBRILLATOR N/A 06/02/2013   Procedure: BI-VENTRICULAR IMPLANTABLE CARDIOVERTER DEFIBRILLATOR  (CRT-D);  Surgeon: Coralyn Mark, MD;  Location[facility] CATH LAB;  Service: Cardiovascular;  Laterality: N/A;  .  BI-VENTRICULAR IMPLANTABLE CARDIOVERTER DEFIBRILLATOR  (CRT-D)  06/02/2013   MDT VivaQuad CRTD implanted by Dr Rayann Heman for ICM, CHF  . CARDIAC CATHETERIZATION  12/2012   S/P MI  . CORONARY ANGIOPLASTY WITH STENT PLACEMENT  2007; 2009; 2011   "?2 +1 +1 "  . CORONARY ANGIOPLASTY WITH STENT PLACEMENT  02/2012  . FRACTURE SURGERY    . HYSTEROSCOPY W/ ENDOMETRIAL ABLATION  ~ 1998  . LEAD REVISION  11-08-13   RA and LV lead revision by  Dr Rayann Heman  . LEAD REVISION Left 11/08/2013   Procedure: LEAD REVISION;  Surgeon: Coralyn Mark, MD;  Location: [facility] CATH LAB;  Service: Cardiovascular;  Laterality: Left;  . LEFT HEART CATHETERIZATION WITH CORONARY ANGIOGRAM N/A 01/03/2013   Procedure: LEFT HEART CATHETERIZATION WITH CORONARY ANGIOGRAM;  Surgeon: Burnell Blanks, MD;  Location: [facility] CATH LAB;  Service: Cardiovascular;  Laterality: N/A;  . ORIF ANKLE FRACTURE Right 10/21/2009  . TUBAL LIGATION  1989     Allergies  Allergen Reactions  . Tape Rash and Other (See Comments)    Severe Reaction (latex tape): Burned hand, Redness-took days to clear up (03/02/12)  . Codeine Nausea And Vomiting  . Prozac [Fluoxetine Hcl] Other (See Comments)    Mean/violent thoughts  . Fosamax [Alendronate Sodium] Nausea Only and Other (See Comments)    Stomach pain  . Keflex [Cephalexin] Nausea Only  . Lipitor [Atorvastatin] Other (See Comments)    Leg pain      Family History  Problem Relation Age of Onset  . Breast cancer Mother   . Heart failure Brother   . Breast cancer Sister   . Breast cancer Sister   . Uterine cancer Sister      Social History Ms. Pallo reports that she has been smoking cigarettes.  She started smoking about 26 years ago. She has a 10.00 pack-year smoking history. She has never used smokeless tobacco. Ms. Schadt reports that she drinks alcohol.   Review of Systems CONSTITUTIONAL: No weight loss, fever, chills, weakness or fatigue.  HEENT: Eyes: No visual loss, blurred  vision, double vision or yellow sclerae.No hearing loss, sneezing, congestion, runny nose or sore throat.  SKIN: No rash or itching.  CARDIOVASCULAR: per hpi RESPIRATORY: No shortness of breath, cough or sputum.  GASTROINTESTINAL: No anorexia, nausea, vomiting or diarrhea. No abdominal pain or blood.  GENITOURINARY: No burning on urination, no polyuria NEUROLOGICAL: No headache, dizziness, syncope, paralysis, ataxia, numbness or tingling in the extremities. No change in bowel or bladder control.  MUSCULOSKELETAL: No muscle, back pain, joint pain or stiffness.  LYMPHATICS: No enlarged nodes. No history of splenectomy.  PSYCHIATRIC: No history of depression or anxiety.  ENDOCRINOLOGIC: No reports of sweating, cold or heat intolerance. No polyuria or polydipsia.  Marland Kitchen   Physical Examination Vitals:   08/31/17 0901  BP: 112/69  Pulse: 64  SpO2: 96%   Vitals:   08/31/17 0901  Weight: 170 lb 9.6 oz (77.4 kg)  Height: 5' (1.524 m)    Gen: resting comfortably, no acute distress HEENT: no scleral icterus, pupils equal round and reactive, no palptable cervical adenopathy,  CV: RRR, no m/r/g, no jvd Resp: Clear to auscultation bilaterally GI: abdomen is soft, non-tender, non-distended, normal bowel sounds, no hepatosplenomegaly MSK: extremities are warm, no edema.  Skin: warm, no rash Neuro:  no focal deficits Psych: appropriate affect   Diagnostic Studies 04/2010 Cath [place], [place]: LVEF 55%, mild anterior hypokinesis. LM normal, LAD with stents in prox and mid portion widely patent, LAD ostial 30%. Diags with luminal irregs. LCX with luminial irregs. RCA with distal patent stents  Stent cards May 2008 DES to PDA, DES to LAD  Jan 2009 DES to prox LAD  Mar 05 2009 DES to LAD x2  05/2011 DES to RCA  Jan 2014 stent done [place], [place]  03/2010 Echo: LVEF 40%, hypokinesis of the anteroseptum.   01/02/13 Cath Hemodynamic Findings: Central aortic pressure: 147/79  Left  ventricular pressure: 130/22/26  Angiographic Findings: Left main: 30%  distal stenosis. It appears that the distal left main has a stent that continues into the LAD.  Left Anterior Descending Artery: Large vessel that courses to the apex. The entire proximal and mid LAD is stented. There is mild stent restenosis in the proximal segment. The mid stented segment has diffuse 30% stent restenosis. The distal vessel becomes small in caliber and has mild diffuse plaque. There is a moderate caliber first diagonal Jessice Madill with mild plaque disease.  Circumflex Artery: Moderate caliber non-dominant vessel with three moderate caliber obtuse marginal branches. No obstructive disease.  Right Coronary Artery: Large dominant vessel with 30% proximal stenosis, heavily calcified mid vessel with patent stent in the mid vessel (no significant restenosis). The distal vessel has diffuse 40-50% stenosis. The posterolateral Andrey Hoobler and PDA are moderate caliber, patent vessels with mild plaque disease.  Left Ventricular Angiogram: LVEF=25-30%. Hypokinesis of the antero-apical wall.  Impression:  1. 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 Elevated troponin following syncopal event. No culprit lesions seen.  3. Moderate to severe LV systolic dysfunction.  Recommendations: Continue medical management of CAD. Workup for syncope is underway. Carotid dopplers today. Will check d-dimer as PE is a possibility. She could also have had an arrythmia with LV dysfunction.  Complications: None. The patient tolerated the procedure well.  01/03/13 Echo - LVEF 25-30%, akinesis of the anteroseptal and apical myocardium, grade I diastolic dysfunction,  35/36/14 Carotid US - The vertebral arteries appear patent with antegrade flow. - Findings consistent with1- 39 percent stenosis,high end of scale,involving the right internal carotid artery. - Findings consistent with 40 - 59 percent stenosis, low  end of scale,involving the left internal carotid artery. - ICA/CCA ratio. right =1.72. left = 1.52 Other specific details can be found in the table(s) above. Prepared and Electronically Authenticated by 12/13/12 Event monitor No symptoms reported, sinus rhythm with conduction delay, occasional PVCs.  04/2013 Echo Study Conclusions  - Left ventricle: The cavity size was normal. Wall thickness was increased in a pattern of mild LVH with moderate basal septal hypertrophy. Systolic function was moderately to severely reduced. The estimated ejection fraction was in the range of 30% to 35%. There is akinesis to dyskinesis of the mid-distal anteroseptal and apical myocardium. There is akinesis of the distalinferoseptal myocardium. Doppler parameters are consistent with abnormal left ventricular relaxation (grade 1 diastolic dysfunction). - Ventricular septum: Septal motion showed abnormal function and dyssynergy. - Aortic valve: Mildly calcified annulus. Probably trileaflet; mildly calcified leaflets. No significant regurgitation. - Mitral valve: Calcified annulus. Trivial regurgitation. - Left atrium: The atrium was mildly dilated. - Right atrium: Central venous pressure: 12m Hg (est). - Tricuspid valve: Trivial regurgitation. - Pulmonary arteries: PA peak pressure: 273mHg (S). - Pericardium, extracardiac: There was no pericardial effusion. Impressions:  - MiPritchettith moderate basal septal hypertrophy, LVEF 30-35% with wall motion abnormalities as outlined, grade 1 diastolic dysfunction. Mild left atrial enlagement. MAC with trivial mitral regurgitation. MIldlysclerotic aortic valve. Trivial tricuspid regurgitation with normal PASP 20 mmHg. 05/2014 echo Study Conclusions  - Left ventricle: Technically limited study. The cavity size was normal. Wall thickness was increased in a pattern of moderate LVH. The estimated ejection fraction was 55%. Doppler parameters are  consistent with abnormal left ventricular relaxation (grade 1 diastolic dysfunction). - Aortic valve: Sclerosis without stenosis. - Right ventricle: The cavity size was normal. Pacer wire or catheter noted in right ventricle. Systolic function was normal. - Impressions: Since the study in 2015, there is definite  improvement in LV function.  Impressions:  - Since the study in 2015, there is definite improvement in LV function.   09/2015 Nuclear Stress  There was no ST segment deviation noted during stress.  No T wave inversion was noted during stress.  Defect 1: There is a medium defect of mild severity present in the basal inferior and mid inferior location. This defect is likely a result of bowel loop attenuation artifact. No significant ischemia identified.  This is a low risk study. No significant ischemia identified.  Nuclear stress EF: 54%. Mid to apical septal wall hypokinesis noted   09/2015 echo Study Conclusions  - Left ventricle: The cavity size was normal. Systolic function was normal. The estimated ejection fraction was in the range of 55% to 60%. Wall motion was normal; there were no regional wall motion abnormalities. Doppler parameters are consistent with abnormal left ventricular relaxation (grade 1 diastolic dysfunction). Doppler parameters are consistent with indeterminate ventricular filling pressure. - Aortic valve: Transvalvular velocity was within the normal range. There was no stenosis. There was no regurgitation. - Mitral valve: Transvalvular velocity was within the normal range. There was no evidence for stenosis. There was no regurgitation. - Left atrium: The atrium was moderately dilated. - Right ventricle: The cavity size was normal. Wall thickness was normal. Systolic function was normal. - Tricuspid valve: There was no regurgitation.          Assessment and Plan  1. ICM/Chronic systolic heart  failure  - previous LVEF 30-35%. After BiV AICD repeat echo with normalized LVEF - medical therapy has been limited due to orthstatic symptoms. ACE-I previously stopped during 09/2015 admission due to hypotension.  - indefinite plavix due to large stent burden  - no recent symptoms, she will continue current meds    2. OSA - she will continue CPAP  3. Hyperlipidemia - difficulty tolerating statins in the past, has tolerated pravastatin - continue current meds  4. HTN - at goal, continue current meds       Arnoldo Lenis, M.D

## 2017-09-01 ENCOUNTER — Telehealth: Payer: Self-pay

## 2017-09-01 NOTE — Telephone Encounter (Signed)
Call to patient as requested by voice mail message.  She reported she received new home monitor and sent a set up report.  Confirmed remote transmission was received today and fluid levels look normal at this time.  She saw Dr Wyline Mood yesterday and he was happy that she has lost 25 lbs since last year by eating healthier.  No changes today.  Next ICM remote transmission 09/21/2017

## 2017-09-17 ENCOUNTER — Other Ambulatory Visit: Payer: Self-pay | Admitting: Cardiology

## 2017-09-20 ENCOUNTER — Other Ambulatory Visit: Payer: Self-pay | Admitting: Cardiology

## 2017-09-21 ENCOUNTER — Ambulatory Visit (INDEPENDENT_AMBULATORY_CARE_PROVIDER_SITE_OTHER): Payer: Medicare Other | Admitting: *Deleted

## 2017-09-21 ENCOUNTER — Ambulatory Visit (INDEPENDENT_AMBULATORY_CARE_PROVIDER_SITE_OTHER): Payer: Medicare Other

## 2017-09-21 DIAGNOSIS — I5022 Chronic systolic (congestive) heart failure: Secondary | ICD-10-CM | POA: Diagnosis not present

## 2017-09-21 DIAGNOSIS — I255 Ischemic cardiomyopathy: Secondary | ICD-10-CM

## 2017-09-21 DIAGNOSIS — Z95810 Presence of automatic (implantable) cardiac defibrillator: Secondary | ICD-10-CM

## 2017-09-21 NOTE — Progress Notes (Signed)
EPIC Encounter for ICM Monitoring  Patient Name: Becky [name] is a 67 y.o. female Date: 09/21/2017 Primary Care Physican: Joseph Art, MD Primary Cardiologist:Branch Electrophysiologist: Allred Dry Weight: Previous weight 173 lbs  Bi-V Pacing:  94.8%           Attempted call to patient and unable to reach.  Left detailed message, per DPR, regarding transmission.  Transmission reviewed.    Thoracic impedance normal but was abnormal suggesting fluid accumulation from 09/01/2017 - 09/18/2017.  Prescribed dosage: Furosemide 40 mg 1.5 tablets (60 mg total) daily and may take an extra 60 mg as needed. Potassium 20 mEq 1 tablet every other day.   Labs: 01/03/2017 Creatinine 1.09, BUN 18. Potassium 3.8, Sodium 140, EGFR 53-61 05/05/2016 Creatinine 1.05, BUN 19, Potassium 3.6, Sodium 135, EGFR 64 05/04/2016 Creatinine 0.93, BUN 21, Potassium 3.0, Sodium 133, EGFR 75 05/03/2016 Creatinine 0.93, BUN 24, Potassium 3.5, Sodium 134, EGFR 75 03/11/2016 Creatinine 0.98, BUN 17, Potassium 2.9, Sodium 136  Recommendations: Left voice mail with ICM number and encouraged to call if experiencing any fluid symptoms.  Follow-up plan: ICM clinic phone appointment on 10/22/2017.    Copy of ICM check sent to Dr. Rayann Heman.   3 month ICM trend: 09/21/2017    1 Year ICM trend:       Rosalene Billings, RN 09/21/2017 12:34 PM

## 2017-09-22 ENCOUNTER — Telehealth: Payer: Self-pay

## 2017-09-22 NOTE — Progress Notes (Signed)
Remote ICD transmission.   

## 2017-09-22 NOTE — Telephone Encounter (Signed)
Remote ICM transmission received.  Attempted call to patient and left detailed message, per DPR, regarding transmission and next ICM scheduled for 10/22/2017.  Advised to return call for any fluid symptoms or questions.    

## 2017-10-06 LAB — CUP PACEART REMOTE DEVICE CHECK
Battery Remaining Longevity: 13 mo
Battery Voltage: 2.91 V
Brady Statistic AP VS Percent: 0.18 %
Brady Statistic RA Percent Paced: 10.11 %
Date Time Interrogation Session: 20190805083824
HighPow Impedance: 96 Ohm
Implantable Lead Implant Date: 20150416
Implantable Lead Implant Date: 20150922
Implantable Lead Implant Date: 20150922
Implantable Lead Location: 753858
Implantable Lead Model: 6935
Implantable Pulse Generator Implant Date: 20150416
Lead Channel Impedance Value: 304 Ohm
Lead Channel Impedance Value: 342 Ohm
Lead Channel Impedance Value: 342 Ohm
Lead Channel Impedance Value: 380 Ohm
Lead Channel Impedance Value: 380 Ohm
Lead Channel Impedance Value: 532 Ohm
Lead Channel Impedance Value: 722 Ohm
Lead Channel Impedance Value: 722 Ohm
Lead Channel Impedance Value: 722 Ohm
Lead Channel Pacing Threshold Pulse Width: 0.4 ms
Lead Channel Pacing Threshold Pulse Width: 0.4 ms
Lead Channel Sensing Intrinsic Amplitude: 4.5 mV
Lead Channel Sensing Intrinsic Amplitude: 5.625 mV
Lead Channel Setting Pacing Amplitude: 2 V
Lead Channel Setting Pacing Amplitude: 2.75 V
Lead Channel Setting Pacing Pulse Width: 1 ms
MDC IDC LEAD LOCATION: 753859
MDC IDC LEAD LOCATION: 753860
MDC IDC MSMT LEADCHNL LV IMPEDANCE VALUE: 304 Ohm
MDC IDC MSMT LEADCHNL LV IMPEDANCE VALUE: 494 Ohm
MDC IDC MSMT LEADCHNL LV IMPEDANCE VALUE: 532 Ohm
MDC IDC MSMT LEADCHNL LV PACING THRESHOLD AMPLITUDE: 2.125 V
MDC IDC MSMT LEADCHNL LV PACING THRESHOLD PULSEWIDTH: 1 ms
MDC IDC MSMT LEADCHNL RA PACING THRESHOLD AMPLITUDE: 0.625 V
MDC IDC MSMT LEADCHNL RA SENSING INTR AMPL: 4.5 mV
MDC IDC MSMT LEADCHNL RV IMPEDANCE VALUE: 304 Ohm
MDC IDC MSMT LEADCHNL RV PACING THRESHOLD AMPLITUDE: 0.875 V
MDC IDC MSMT LEADCHNL RV SENSING INTR AMPL: 5.625 mV
MDC IDC SET LEADCHNL RV PACING AMPLITUDE: 2 V
MDC IDC SET LEADCHNL RV PACING PULSEWIDTH: 0.4 ms
MDC IDC SET LEADCHNL RV SENSING SENSITIVITY: 0.3 mV
MDC IDC STAT BRADY AP VP PERCENT: 9.98 %
MDC IDC STAT BRADY AS VP PERCENT: 86.28 %
MDC IDC STAT BRADY AS VS PERCENT: 3.56 %
MDC IDC STAT BRADY RV PERCENT PACED: 10.82 %

## 2017-10-21 ENCOUNTER — Other Ambulatory Visit: Payer: Self-pay | Admitting: Cardiology

## 2017-10-22 ENCOUNTER — Ambulatory Visit (INDEPENDENT_AMBULATORY_CARE_PROVIDER_SITE_OTHER): Payer: Medicare Other

## 2017-10-22 DIAGNOSIS — I5022 Chronic systolic (congestive) heart failure: Secondary | ICD-10-CM

## 2017-10-22 DIAGNOSIS — Z95810 Presence of automatic (implantable) cardiac defibrillator: Secondary | ICD-10-CM | POA: Diagnosis not present

## 2017-10-23 ENCOUNTER — Telehealth: Payer: Self-pay

## 2017-10-23 NOTE — Progress Notes (Signed)
EPIC Encounter for ICM Monitoring  Patient Name: Becky [name] is a 67 y.o. female Date: 10/23/2017 Primary Care Physican: Joseph Art, MD Primary Cardiologist:Branch Electrophysiologist: Allred Dry Weight:Previous weight 173lbs  Bi-V Pacing:93.8%      Attempted call to patient and unable to reach.  Left detailed message, per DPR, regarding transmission.  Transmission reviewed.    Thoracic impedance abnormal suggesting fluid accumulation starting 10/13/2017.  Prescribed dosage: Furosemide 40 mg 1.5 tablets (60 mg total) daily and may take an extra 60 mg as needed. Potassium 20 mEq 1 tablet every other day.   Labs: 01/03/2017 Creatinine 1.09, BUN 18. Potassium 3.8, Sodium 140, EGFR 53-61 05/05/2016 Creatinine 1.05, BUN 19, Potassium 3.6, Sodium 135, EGFR 64 05/04/2016 Creatinine 0.93, BUN 21, Potassium 3.0, Sodium 133, EGFR 75 05/03/2016 Creatinine 0.93, BUN 24, Potassium 3.5, Sodium 134, EGFR 75 03/11/2016 Creatinine 0.98, BUN 17, Potassium 2.9, Sodium 136  Recommendations: NONE - Unable to reach.  If patient is reached, will advise to take extra 60 mg of Furosemide x 3 days  Follow-up plan: ICM clinic phone appointment on 10/29/2017 to recheck fluid levels.     Copy of ICM check sent to Dr. Rayann Heman and Dr Harl Bowie.   3 month ICM trend: 10/22/2017    1 Year ICM trend:       Rosalene Billings, RN 10/23/2017 8:54 AM

## 2017-10-23 NOTE — Telephone Encounter (Signed)
Remote ICM transmission received.  Attempted call to patient and left detailed message, per DPR, to return call regarding transmission.    

## 2017-10-29 ENCOUNTER — Ambulatory Visit (INDEPENDENT_AMBULATORY_CARE_PROVIDER_SITE_OTHER): Payer: Medicare Other

## 2017-10-29 DIAGNOSIS — I5022 Chronic systolic (congestive) heart failure: Secondary | ICD-10-CM

## 2017-10-29 DIAGNOSIS — Z95810 Presence of automatic (implantable) cardiac defibrillator: Secondary | ICD-10-CM

## 2017-10-30 ENCOUNTER — Telehealth: Payer: Self-pay

## 2017-10-30 NOTE — Telephone Encounter (Signed)
Remote ICM transmission received.  Attempted call to patient and left detailed message, per DPR, regarding transmission and next ICM scheduled for 11/24/2017.  Advised to return call for any fluid symptoms or questions.    

## 2017-10-30 NOTE — Progress Notes (Signed)
EPIC Encounter for ICM Monitoring  Patient Name: Becky [name] is a 67 y.o. female Date: 10/30/2017 Primary Care Physican: Joseph Art, MD Primary Cardiologist:Branch Electrophysiologist: Allred Dry Weight:Previous weight173lbs  Bi-V Pacing:92.6%       Attempted call to patient and unable to reach.  Left detailed message, per DPR, regarding transmission.  Transmission reviewed.    Thoracic impedance returned close to normal baseline after last ICM remote transmission 10/22/17.  Prescribed: Furosemide 40 mg 1.5 tablets (60 mg total) daily and may take an extra 60 mg as needed. Potassium 20 mEq 1 tablet every other day.   Labs: 01/03/2017 Creatinine 1.09, BUN 18. Potassium 3.8, Sodium 140, EGFR 53-61 05/05/2016 Creatinine 1.05, BUN 19, Potassium 3.6, Sodium 135, EGFR 64 05/04/2016 Creatinine 0.93, BUN 21, Potassium 3.0, Sodium 133, EGFR 75 05/03/2016 Creatinine 0.93, BUN 24, Potassium 3.5, Sodium 134, EGFR 75 03/11/2016 Creatinine 0.98, BUN 17, Potassium 2.9, Sodium 136  Recommendations: Left voice mail with ICM number and encouraged to call if experiencing any fluid symptoms.  Follow-up plan: ICM clinic phone appointment on 11/24/2017.      Copy of ICM check sent to Dr. Rayann Heman.   3 month ICM trend: 10/29/2017    1 Year ICM trend:       Rosalene Billings, RN 10/30/2017 10:31 AM

## 2017-11-24 ENCOUNTER — Telehealth: Payer: Self-pay

## 2017-11-24 ENCOUNTER — Ambulatory Visit (INDEPENDENT_AMBULATORY_CARE_PROVIDER_SITE_OTHER): Payer: Medicare Other

## 2017-11-24 ENCOUNTER — Telehealth: Payer: Self-pay | Admitting: Cardiology

## 2017-11-24 DIAGNOSIS — I5022 Chronic systolic (congestive) heart failure: Secondary | ICD-10-CM | POA: Diagnosis not present

## 2017-11-24 DIAGNOSIS — Z95810 Presence of automatic (implantable) cardiac defibrillator: Secondary | ICD-10-CM

## 2017-11-24 NOTE — Telephone Encounter (Signed)
Remote ICM transmission received.  Attempted call to patient and left detailed message, per DPR, regarding transmission and next ICM scheduled for 12/28/2017.  Advised to return call for any fluid symptoms or questions.    

## 2017-11-24 NOTE — Telephone Encounter (Signed)
Spoke with pt and reminded pt of remote transmission that is due today. Pt verbalized understanding.   

## 2017-11-24 NOTE — Progress Notes (Signed)
EPIC Encounter for ICM Monitoring  Patient Name: Becky [name] is a 67 y.o. female Date: 11/24/2017 Primary Care Physican: Joseph Art, MD Primary Cardiologist:Branch Electrophysiologist: Allred Dry Weight:Previous weight173lbs  Bi-V Pacing:92.6%      Attempted call to patient and unable to reach.  Left detailed message, per DPR, regarding transmission.  Transmission reviewed.    Thoracic impedance normal but was abnormal suggesting fluid accumulation from 10/29/2017 - 11/16/2017.  Prescribed: Furosemide 40 mg 1.5 tablets (60 mg total) daily and may take an extra 60 mg as needed. Potassium 20 mEq 1 tablet every other day.   Labs: 01/03/2017 Creatinine 1.09, BUN 18. Potassium 3.8, Sodium 140, EGFR 53-61 05/05/2016 Creatinine 1.05, BUN 19, Potassium 3.6, Sodium 135, EGFR 64 05/04/2016 Creatinine 0.93, BUN 21, Potassium 3.0, Sodium 133, EGFR 75 05/03/2016 Creatinine 0.93, BUN 24, Potassium 3.5, Sodium 134, EGFR 75 03/11/2016 Creatinine 0.98, BUN 17, Potassium 2.9, Sodium 136  Recommendations: Left voice mail with ICM number and encouraged to call if experiencing any fluid symptoms.  Follow-up plan: ICM clinic phone appointment on 12/28/2017.    Copy of ICM check sent to Dr. Rayann Heman.   3 month ICM trend: 11/24/2017    1 Year ICM trend:       Rosalene Billings, RN 11/24/2017 3:39 PM

## 2017-12-17 ENCOUNTER — Other Ambulatory Visit: Payer: Self-pay | Admitting: Cardiology

## 2017-12-28 ENCOUNTER — Ambulatory Visit (INDEPENDENT_AMBULATORY_CARE_PROVIDER_SITE_OTHER): Payer: Medicare Other | Admitting: *Deleted

## 2017-12-28 ENCOUNTER — Ambulatory Visit (INDEPENDENT_AMBULATORY_CARE_PROVIDER_SITE_OTHER): Payer: Medicare Other

## 2017-12-28 DIAGNOSIS — I5022 Chronic systolic (congestive) heart failure: Secondary | ICD-10-CM | POA: Diagnosis not present

## 2017-12-28 DIAGNOSIS — I255 Ischemic cardiomyopathy: Secondary | ICD-10-CM

## 2017-12-28 DIAGNOSIS — Z95810 Presence of automatic (implantable) cardiac defibrillator: Secondary | ICD-10-CM

## 2017-12-28 NOTE — Progress Notes (Signed)
Remote ICD transmission.   

## 2017-12-28 NOTE — Progress Notes (Signed)
EPIC Encounter for ICM Monitoring  Patient Name: Becky [name] is a 67 y.o. female Date: 12/28/2017 Primary Care Physican: Joseph Art, MD Primary Cardiologist:Branch Electrophysiologist: Allred Bi-V Pacing:91.3%    Last Weight: 173lbs  Today's Weight: unknown      Heart Failure questions reviewed, pt asymptomatic.   Thoracic impedance abnormal suggesting fluid accumulation starting 11/25/2017.   Prescribed: Furosemide 40 mg 1.5 tablets (60 mg total) daily and may take an extra 60 mg as needed. Potassium 20 mEq 1 tablet every other day.   Labs: 01/03/2017 Creatinine 1.09, BUN 18. Potassium 3.8, Sodium 140, EGFR 53-61 05/05/2016 Creatinine 1.05, BUN 19, Potassium 3.6, Sodium 135, EGFR 64 05/04/2016 Creatinine 0.93, BUN 21, Potassium 3.0, Sodium 133, EGFR 75 05/03/2016 Creatinine 0.93, BUN 24, Potassium 3.5, Sodium 134, EGFR 75 03/11/2016 Creatinine 0.98, BUN 17, Potassium 2.9, Sodium 136  Recommendations:   Advised to follow prescription to take extra 60 mg Furosemide x 3 days and return to 60 mg once a day.   Follow-up plan: ICM clinic phone appointment on 01/07/2018 (manual send) to recheck fluid levels.     Copy of ICM check sent to Dr. Rayann Heman and Dr Harl Bowie.   3 month ICM trend: 12/28/2017    1 Year ICM trend:       Rosalene Billings, RN 12/28/2017 9:00 AM

## 2017-12-29 NOTE — Progress Notes (Signed)
Received: Yesterday  Message Contents  Hillis Range, MD  Mathan Darroch, Josephine Igo, RN        Hopefully she will improve with your change.

## 2018-01-07 ENCOUNTER — Telehealth: Payer: Self-pay

## 2018-01-07 ENCOUNTER — Ambulatory Visit (INDEPENDENT_AMBULATORY_CARE_PROVIDER_SITE_OTHER): Payer: Medicare Other

## 2018-01-07 DIAGNOSIS — Z95810 Presence of automatic (implantable) cardiac defibrillator: Secondary | ICD-10-CM

## 2018-01-07 DIAGNOSIS — I5022 Chronic systolic (congestive) heart failure: Secondary | ICD-10-CM

## 2018-01-07 NOTE — Telephone Encounter (Signed)
LMOVM reminding pt to send remote transmission.   

## 2018-01-07 NOTE — Progress Notes (Signed)
EPIC Encounter for ICM Monitoring  Patient Name: Becky [name] is a 67 y.o. female Date: 01/07/2018 Primary Care Physican: Aaron, Caren T, MD Primary Cardiologist:Branch Electrophysiologist: Allred Bi-V Pacing:90.3% Last Weight: 173lbs  Today's Weight: 173 lbs       Heart Failure questions reviewed, pt asymptomatic.  She said she felt lighter after taking 3 days of Furosemide.  She reports following low salt diet.    Thoracic impedance remains abnormal suggesting fluid accumulation after taking extra x 3 days.  Impedance did return to baseline 1 day but then trended below baseline again.    Prescribed: Furosemide 40 mg 1.5 tablets (60 mg total) daily and may take an extra 60 mg as needed. Potassium 20 mEq 1 tablet every other day.   Labs: 01/03/2017 Creatinine 1.09, BUN 18. Potassium 3.8, Sodium 140, EGFR 53-61 05/05/2016 Creatinine 1.05, BUN 19, Potassium 3.6, Sodium 135, EGFR 64 05/04/2016 Creatinine 0.93, BUN 21, Potassium 3.0, Sodium 133, EGFR 75 05/03/2016 Creatinine 0.93, BUN 24, Potassium 3.5, Sodium 134, EGFR 75 03/11/2016 Creatinine 0.98, BUN 17, Potassium 2.9, Sodium 136  Recommendations:  Advised to take extra 60 mg of Furosemide x 2 days and then return to prescribed dosage.  Patient has PCP appointment tomorrow and will labs drawn.  She will have the results faxed to Dr Branch's office.   Follow-up plan: ICM clinic phone appointment on 01/11/2018 to recheck fluid levels.    Copy of ICM check sent to Dr. Allred and Dr Branch for review and if any recommendations will call back.   3 month ICM trend: 01/07/2018    1 Year ICM trend:       Laurie S Short, RN 01/07/2018 2:03 PM   

## 2018-01-11 ENCOUNTER — Ambulatory Visit (INDEPENDENT_AMBULATORY_CARE_PROVIDER_SITE_OTHER): Payer: Medicare Other

## 2018-01-11 DIAGNOSIS — I5022 Chronic systolic (congestive) heart failure: Secondary | ICD-10-CM

## 2018-01-11 DIAGNOSIS — Z95810 Presence of automatic (implantable) cardiac defibrillator: Secondary | ICD-10-CM

## 2018-01-12 NOTE — Progress Notes (Signed)
EPIC Encounter for ICM Monitoring  Patient Name: Becky [name] is a 67 y.o. female Date: 01/12/2018 Primary Care Physican: Joseph Art, MD Primary Cardiologist:Branch Electrophysiologist: Allred Bi-V Pacing:90.3% Last Weight:173lbs Today's Weight: 170 lbs        Heart Failure questions reviewed, pt asymptomatic.   Thoracic impedance returned to normal after taking 3 days Furosemide.   Prescribed: Furosemide 40 mg 1.5 tablets (60 mg total) daily and may take an extra 60 mg as needed. Potassium 20 mEq 1 tablet every other day.   Labs: 01/03/2017 Creatinine 1.09, BUN 18. Potassium 3.8, Sodium 140, EGFR 53-61 05/05/2016 Creatinine 1.05, BUN 19, Potassium 3.6, Sodium 135, EGFR 64 05/04/2016 Creatinine 0.93, BUN 21, Potassium 3.0, Sodium 133, EGFR 75 05/03/2016 Creatinine 0.93, BUN 24, Potassium 3.5, Sodium 134, EGFR 75 03/11/2016 Creatinine 0.98, BUN 17, Potassium 2.9, Sodium 136  Recommendations: No changes.    Encouraged to call for fluid symptoms.  Follow-up plan: ICM clinic phone appointment on 02/04/2018.       Copy of ICM check sent to Dr. Rayann Heman.   3 month ICM trend: 01/12/2018    1 Year ICM trend:       Rosalene Billings, RN 01/12/2018 2:44 PM

## 2018-01-18 ENCOUNTER — Other Ambulatory Visit: Payer: Self-pay | Admitting: Cardiology

## 2018-02-04 ENCOUNTER — Ambulatory Visit (INDEPENDENT_AMBULATORY_CARE_PROVIDER_SITE_OTHER): Payer: Medicare Other

## 2018-02-04 DIAGNOSIS — Z95810 Presence of automatic (implantable) cardiac defibrillator: Secondary | ICD-10-CM

## 2018-02-04 DIAGNOSIS — I5022 Chronic systolic (congestive) heart failure: Secondary | ICD-10-CM | POA: Diagnosis not present

## 2018-02-05 NOTE — Progress Notes (Signed)
EPIC Encounter for ICM Monitoring  Patient Name: Becky [name] is a 67 y.o. female Date: 02/05/2018 Primary Care Physican: Aaron, Caren T, MD Primary Cardiologist:Branch Electrophysiologist: Allred Bi-V Pacing:90.8% Last Weight:170lbs Today's Weight: 176 lbs                                                    Heart failure questions reviewed.  Symptomatic with weight gain.      Thoracic impedance abnormal suggesting fluid accumulation starting 02/02/2018  Prescribed: Furosemide 40 mg 1.5 tablets (60 mg total) daily and may take an extra 60 mg as needed. Potassium 20 mEq 1 tablet every other day.   Labs: 01/03/2017 Creatinine 1.09, BUN 18. Potassium 3.8, Sodium 140, EGFR 53-61 05/05/2016 Creatinine 1.05, BUN 19, Potassium 3.6, Sodium 135, EGFR 64 05/04/2016 Creatinine 0.93, BUN 21, Potassium 3.0, Sodium 133, EGFR 75 05/03/2016 Creatinine 0.93, BUN 24, Potassium 3.5, Sodium 134, EGFR 75 03/11/2016 Creatinine 0.98, BUN 17, Potassium 2.9, Sodium 136  Recommendations:  Advised to take extra 60 mg Furosemide x 3 days and then return to prescribed dosage.    Follow-up plan: ICM clinic phone appointment on 02/11/2018.       Copy of ICM check sent to Dr. Allred.   3 month ICM trend: 02/04/2018    1 Year ICM trend:       Laurie S Short, RN 02/05/2018 3:27 PM   

## 2018-02-11 ENCOUNTER — Ambulatory Visit (INDEPENDENT_AMBULATORY_CARE_PROVIDER_SITE_OTHER): Payer: Medicare Other

## 2018-02-11 DIAGNOSIS — Z95810 Presence of automatic (implantable) cardiac defibrillator: Secondary | ICD-10-CM

## 2018-02-11 DIAGNOSIS — I5022 Chronic systolic (congestive) heart failure: Secondary | ICD-10-CM

## 2018-02-11 NOTE — Progress Notes (Signed)
EPIC Encounter for ICM Monitoring  Patient Name: Becky [name] is a 67 y.o. female Date: 02/11/2018 Primary Care Physican: Joseph Art, MD Primary Cardiologist:Branch Electrophysiologist: Allred Bi-V Pacing:97.3% Last Weight:176lbs Today's Weight:170lbs (baseline)   Heart failure questions reviewed. Weight has returned to baseline.       Thoracic impedancereturned to normalafter taking Furosemide x 3 days  Prescribed:Furosemide 40 mg 1.5 tablets (60 mg total) daily and may take an extra 60 mg as needed. Potassium 20 mEq 1 tablet every other day.   Labs: 01/03/2017 Creatinine 1.09, BUN 18. Potassium 3.8, Sodium 140, EGFR 53-61 05/05/2016 Creatinine 1.05, BUN 19, Potassium 3.6, Sodium 135, EGFR 64 05/04/2016 Creatinine 0.93, BUN 21, Potassium 3.0, Sodium 133, EGFR 75 05/03/2016 Creatinine 0.93, BUN 24, Potassium 3.5, Sodium 134, EGFR 75 03/11/2016 Creatinine 0.98, BUN 17, Potassium 2.9, Sodium 136  Recommendations: No changes.  Follow-up plan: ICM clinic phone appointment on1/23/2020.   Copy of ICM check sent to Dr.Allred.   3 month ICM trend: 02/11/2018    1 Year ICM trend:       Rosalene Billings, RN 02/11/2018 12:51 PM

## 2018-02-19 LAB — CUP PACEART REMOTE DEVICE CHECK
Brady Statistic AP VP Percent: 27.37 %
Brady Statistic AS VP Percent: 69.31 %
Brady Statistic RA Percent Paced: 26.87 %
Brady Statistic RV Percent Paced: 8.32 %
Date Time Interrogation Session: 20191111051705
HighPow Impedance: 71 Ohm
Implantable Lead Implant Date: 20150416
Implantable Lead Implant Date: 20150922
Implantable Lead Location: 753858
Implantable Lead Model: 5076
Lead Channel Impedance Value: 323 Ohm
Lead Channel Impedance Value: 323 Ohm
Lead Channel Impedance Value: 323 Ohm
Lead Channel Impedance Value: 399 Ohm
Lead Channel Impedance Value: 513 Ohm
Lead Channel Impedance Value: 513 Ohm
Lead Channel Impedance Value: 627 Ohm
Lead Channel Pacing Threshold Amplitude: 0.625 V
Lead Channel Pacing Threshold Amplitude: 1 V
Lead Channel Pacing Threshold Pulse Width: 0.4 ms
Lead Channel Pacing Threshold Pulse Width: 1 ms
Lead Channel Sensing Intrinsic Amplitude: 2.75 mV
Lead Channel Sensing Intrinsic Amplitude: 6.75 mV
Lead Channel Setting Pacing Amplitude: 2 V
Lead Channel Setting Sensing Sensitivity: 0.3 mV
MDC IDC LEAD IMPLANT DT: 20150922
MDC IDC LEAD LOCATION: 753859
MDC IDC LEAD LOCATION: 753860
MDC IDC MSMT BATTERY REMAINING LONGEVITY: 15 mo
MDC IDC MSMT BATTERY VOLTAGE: 2.89 V
MDC IDC MSMT LEADCHNL LV IMPEDANCE VALUE: 342 Ohm
MDC IDC MSMT LEADCHNL LV IMPEDANCE VALUE: 456 Ohm
MDC IDC MSMT LEADCHNL LV IMPEDANCE VALUE: 589 Ohm
MDC IDC MSMT LEADCHNL LV IMPEDANCE VALUE: 703 Ohm
MDC IDC MSMT LEADCHNL LV PACING THRESHOLD AMPLITUDE: 2.75 V
MDC IDC MSMT LEADCHNL RA PACING THRESHOLD PULSEWIDTH: 0.4 ms
MDC IDC MSMT LEADCHNL RA SENSING INTR AMPL: 2.75 mV
MDC IDC MSMT LEADCHNL RV IMPEDANCE VALUE: 304 Ohm
MDC IDC MSMT LEADCHNL RV IMPEDANCE VALUE: 380 Ohm
MDC IDC MSMT LEADCHNL RV SENSING INTR AMPL: 6.75 mV
MDC IDC PG IMPLANT DT: 20150416
MDC IDC SET LEADCHNL LV PACING AMPLITUDE: 3.25 V
MDC IDC SET LEADCHNL LV PACING PULSEWIDTH: 1 ms
MDC IDC SET LEADCHNL RV PACING AMPLITUDE: 2 V
MDC IDC SET LEADCHNL RV PACING PULSEWIDTH: 0.4 ms
MDC IDC STAT BRADY AP VS PERCENT: 0.44 %
MDC IDC STAT BRADY AS VS PERCENT: 2.88 %

## 2018-03-11 ENCOUNTER — Ambulatory Visit (INDEPENDENT_AMBULATORY_CARE_PROVIDER_SITE_OTHER): Payer: Medicare Other

## 2018-03-11 DIAGNOSIS — I5022 Chronic systolic (congestive) heart failure: Secondary | ICD-10-CM

## 2018-03-11 DIAGNOSIS — Z95810 Presence of automatic (implantable) cardiac defibrillator: Secondary | ICD-10-CM

## 2018-03-15 ENCOUNTER — Telehealth: Payer: Self-pay

## 2018-03-15 NOTE — Telephone Encounter (Signed)
Remote ICM transmission received.  Attempted call to patient regarding ICM remote transmission and left detailed message, per DPR, with next ICM remote transmission date of 04/13/2018.  Advised to return call for any fluid symptoms or questions.    

## 2018-03-15 NOTE — Progress Notes (Signed)
EPIC Encounter for ICM Monitoring  Patient Name: Becky [name] is a 68 y.o. female Date: 03/15/2018 Primary Care Physican: Joseph Art, MD Primary Cardiologist:Branch Electrophysiologist: Allred Bi-V Pacing:93.5% Last Weight:170lbs (baseline) Today's Weight:unknown    Attempted call to patient.  Left detailed message per DPR regarding transmission. Transmission reviewed.   Thoracic impedancenormal. Prescribed:Furosemide 40 mg 1.5 tablets (60 mg total) daily and may take an extra 60 mg as needed. Potassium 20 mEq 1 tablet every other day.   Labs: 01/03/2017 Creatinine 1.09, BUN 18. Potassium 3.8, Sodium 140, EGFR 53-61 05/05/2016 Creatinine 1.05, BUN 19, Potassium 3.6, Sodium 135, EGFR 64 05/04/2016 Creatinine 0.93, BUN 21, Potassium 3.0, Sodium 133, EGFR 75 05/03/2016 Creatinine 0.93, BUN 24, Potassium 3.5, Sodium 134, EGFR 75 03/11/2016 Creatinine 0.98, BUN 17, Potassium 2.9, Sodium 136  Recommendations:Left voice mail with ICM number and encouraged to call if experiencing any fluid symptoms.  Follow-up plan: ICM clinic phone appointment on2/25/2020.   Copy of ICM check sent to Dr.Allred.  3 month ICM trend: 03/11/2018    1 Year ICM trend:       Rosalene Billings, RN 03/15/2018 12:21 PM

## 2018-03-21 ENCOUNTER — Other Ambulatory Visit: Payer: Self-pay | Admitting: Cardiology

## 2018-03-24 ENCOUNTER — Telehealth: Payer: Self-pay | Admitting: *Deleted

## 2018-03-24 NOTE — Telephone Encounter (Signed)
Patient is scheduled to have several teeth extracted on 04/28/2018 with Dr. Andrey Campanile over a period of time and is wanting to know if patient can come off plavix? How many days prior and when does she need to restart it after extractions? Fax number [phone]

## 2018-03-25 NOTE — Telephone Encounter (Signed)
Ok to hold plavix, would hold starting 5 days prior to procedure and restart day after   Dominga Ferry MD

## 2018-03-25 NOTE — Telephone Encounter (Signed)
Faxed to Dr. Andrey Campanile

## 2018-03-26 ENCOUNTER — Encounter: Payer: Self-pay | Admitting: Cardiology

## 2018-03-26 ENCOUNTER — Encounter: Payer: Self-pay | Admitting: *Deleted

## 2018-03-26 ENCOUNTER — Ambulatory Visit (INDEPENDENT_AMBULATORY_CARE_PROVIDER_SITE_OTHER): Payer: Medicare Other | Admitting: Cardiology

## 2018-03-26 VITALS — BP 156/81 | HR 73 | Ht 60.0 in | Wt 175.8 lb

## 2018-03-26 DIAGNOSIS — I2510 Atherosclerotic heart disease of native coronary artery without angina pectoris: Secondary | ICD-10-CM | POA: Diagnosis not present

## 2018-03-26 DIAGNOSIS — I10 Essential (primary) hypertension: Secondary | ICD-10-CM

## 2018-03-26 DIAGNOSIS — I255 Ischemic cardiomyopathy: Secondary | ICD-10-CM

## 2018-03-26 DIAGNOSIS — E782 Mixed hyperlipidemia: Secondary | ICD-10-CM | POA: Diagnosis not present

## 2018-03-26 MED ORDER — ASPIRIN EC 81 MG PO TBEC: 81.0000 mg | DELAYED_RELEASE_TABLET | Freq: Every day | ORAL | Status: AC

## 2018-03-26 NOTE — Progress Notes (Signed)
Clinical Summary Becky [name] is a 68 y.o.female seen today for follow up of the following medical problems.  1. ICM/Chronic systolic heart failure - history of multiple PCIs as described below, mainly in [place].  - 04/2013 LVEF 30-35%. Repeat echo after BiV AICD 05/2014 shows LVEF 33%, grade I diastolic dysfunction.  - 09/2015 echo LVEF 55-60%, no WMAs, grade I diastolic dysfunction - 35/4562 echo [place]: LVEF 65-70%, moderate LVH.  - medical therapy has been limited by orthostatic symtpoms. Not on ACE or ARB -she has been on long term plavix due to her extensive stent burden    - no recent chest pain. Some SOB at times, up and down, better with breathing treatments - no LE edema - compliant with meds  2. OSA  - compliant with cpap, followed by Dr Marjory Lies in [place], [place].   3. Hyperlipidemia - muscle aches on multiple statins - tolerating pravastatin   - recent labs with pcp  4. HTN - she is compliant with meds  5. COPD - followed bypulmonary in [place] - followed by Dr Luciana Axe  6. Teeth extraction - upcomgin surgery in March    Past Medical History:  Diagnosis Date  . AICD (automatic cardioverter/defibrillator) present   . Anxiety   . Arthritis    "all my joints; neck, back, legs, arms, elbows"  (10/03/2015)  . Asthma   . CAD (coronary artery disease)    a. 5/08: s/p DES to PDA and DES to LAD; b. 1/09: s/p DES to pLAD; c. 1/11: s/p DES to LAD x 2, d. LHC (3/12 in [place] - EF 55%, mild ant HK, nl LM, LAD stents ok, oLAD 30, RCA stents ok;  e.  4/13:  s/p DES to RCA, f. 1/14: dLM 20-30, mOM3 20-30, mLAD 80 ISR => s/p 2.5x16 mm Promus Element DES; EF 45-50%   . CHF (congestive heart failure) ([place])   . Chronic bronchitis ([place]y [name])   . Chronic lower back pain    "into pelvis region" (10/03/2015)  . COPD (chronic obstructive pulmonary disease) ([place])   . Daily headache    "might be from sinus" (10/03/2015)  . Depression    hx  (10/03/2015)  . Fibromyalgia   . GERD (gastroesophageal reflux disease)   . Heart murmur   . HLD (hyperlipidemia)   . Hypertension   . Ischemic cardiomyopathy    a. Echo (11/14):  EF 25%   . LBBB (left bundle Becky [name])   . Myocardial infarction ([place]) 12/2012  . OSA on CPAP   . Pneumonia 1950s  . Syncope 11/14   LifeVest placed  . Upper back pain, chronic    "into my shoulders; fibromyalgia works here" (10/03/2015)     Allergies  Allergen Reactions  . Tape Rash and Other (See Comments)    Severe Reaction (latex tape): Burned hand, Redness-took days to clear up (03/02/12)  . Codeine Nausea And Vomiting  . Prozac [Fluoxetine Hcl] Other (See Comments)    Mean/violent thoughts  . Fosamax [Alendronate Sodium] Nausea Only and Other (See Comments)    Stomach pain  . Keflex [Cephalexin] Nausea Only  . Lipitor [Atorvastatin] Other (See Comments)    Leg pain     Current Outpatient Medications  Medication Sig Dispense Refill  . acetaminophen-codeine (TYLENOL #3) 300-30 MG tablet Take 1 tablet by mouth every 6 (six) hours as needed for moderate pain.   2  . Aspirin-Salicylamide-Caffeine (BC HEADACHE PO) Take 1 each by mouth as needed.     Marland Kitchen  budesonide (PULMICORT) 0.5 MG/2ML nebulizer solution Take 0.5 mg by nebulization 2 (two) times daily.    . calcium carbonate (CALCIUM 600) 1500 (600 Ca) MG TABS tablet Take 600 mg of elemental calcium by mouth daily with breakfast.    . cetirizine (ZYRTEC) 10 MG tablet Take 1 tablet by mouth daily.  4  . Cholecalciferol (VITAMIN D) 2000 UNITS CAPS Take 2,000 Units by mouth daily.    . clopidogrel (PLAVIX) 75 MG tablet Take 75 mg by mouth daily.     Marland Kitchen Dextromethorphan-Guaifenesin (MUCINEX DM) 30-600 MG TB12 Take 1 tablet by mouth 2 (two) times daily as needed.  0  . escitalopram (LEXAPRO) 20 MG tablet Take 20 mg by mouth daily.     . fluticasone (FLONASE) 50 MCG/ACT nasal spray Place 2 sprays into both nostrils daily.  7  . formoterol  (PERFOROMIST) 20 MCG/2ML nebulizer solution Take 20 mcg by nebulization 2 (two) times daily.    . furosemide (LASIX) 40 MG tablet TAKE 1 AND 1/2 TABLET BY MOUTH DAILY. MAY TAKE EXTRA 1 AND 1/2 TABLET AS NEEDED 90 tablet 1  . furosemide (LASIX) 40 MG tablet TAKE 1 AND 1/2 TABLET BY MOUTH DAILY. MAY TAKE EXTRA 1 AND 1/2 TABLET AS NEEDED 90 tablet 6  . gabapentin (NEURONTIN) 300 MG capsule Take 1 capsule by mouth 3 (three) times daily.    Marland Kitchen ipratropium-albuterol (DUONEB) 0.5-2.5 (3) MG/3ML SOLN Take 3 mLs by nebulization every 6 (six) hours as needed (shortness of breath).    . lactulose (CHRONULAC) 10 GM/15ML solution Take 10 g by mouth 2 (two) times daily as needed for mild constipation.    . magnesium oxide (MAG-OX) 400 MG tablet Take 400 mg by mouth daily.    . meclizine (ANTIVERT) 25 MG tablet Take 1 tablet by mouth 2 (two) times daily as needed.  3  . metFORMIN (GLUCOPHAGE) 500 MG tablet Take 500 mg by mouth 2 (two) times daily with a meal.    . metoprolol succinate (TOPROL XL) 50 MG 24 hr tablet Take 1 tablet (50 mg total) by mouth daily. Take with or immediately following a meal. 90 tablet 3  . montelukast (SINGULAIR) 10 MG tablet Take 10 mg by mouth every morning.     . nitroGLYCERIN (NITROSTAT) 0.4 MG SL tablet Place 1 tablet (0.4 mg total) under the tongue every 5 (five) minutes x 3 doses as needed. If no relief after 3rd dose, proceed to ED 25 tablet 5  . pantoprazole (PROTONIX) 40 MG tablet TAKE 1 TABLET BY MOUTH DAILY 90 tablet 1  . potassium chloride SA (K-DUR,KLOR-CON) 20 MEQ tablet TAKE 1 TABLET BY MOUTH EVERY OTHER DAY 45 tablet 0  . potassium chloride SA (K-DUR,KLOR-CON) 20 MEQ tablet TAKE 1 TABLET BY MOUTH EVERY OTHER DAY 45 tablet 3  . pravastatin (PRAVACHOL) 40 MG tablet Take 40 mg by mouth daily.    . pravastatin (PRAVACHOL) 40 MG tablet TAKE 1 TABLET BY MOUTH EVERY EVENING 90 tablet 2  . rOPINIRole (REQUIP) 0.5 MG tablet Take 0.5 mg by mouth at bedtime.     Marland Kitchen spironolactone  (ALDACTONE) 25 MG tablet TAKE 1/2 TABLET BY MOUTH DAILY 45 tablet 3  . spironolactone (ALDACTONE) 25 MG tablet TAKE 1/2 TABLET BY MOUTH DAILY 45 tablet 3   No current facility-administered medications for this visit.      Past Surgical History:  Procedure Laterality Date  . BI-VENTRICULAR IMPLANTABLE CARDIOVERTER DEFIBRILLATOR N/A 06/02/2013   Procedure: BI-VENTRICULAR IMPLANTABLE CARDIOVERTER DEFIBRILLATOR  (CRT-D);  Surgeon: Coralyn Mark, MD;  Location: [facility] CATH LAB;  Service: Cardiovascular;  Laterality: N/A;  . BI-VENTRICULAR IMPLANTABLE CARDIOVERTER DEFIBRILLATOR  (CRT-D)  06/02/2013   MDT VivaQuad CRTD implanted by Dr Rayann Heman for ICM, CHF  . CARDIAC CATHETERIZATION  12/2012   S/P MI  . CORONARY ANGIOPLASTY WITH STENT PLACEMENT  2007; 2009; 2011   "?2 +1 +1 "  . CORONARY ANGIOPLASTY WITH STENT PLACEMENT  02/2012  . FRACTURE SURGERY    . HYSTEROSCOPY W/ ENDOMETRIAL ABLATION  ~ 1998  . LEAD REVISION  11-08-13   RA and LV lead revision by Dr Rayann Heman  . LEAD REVISION Left 11/08/2013   Procedure: LEAD REVISION;  Surgeon: Coralyn Mark, MD;  Location: [facility] CATH LAB;  Service: Cardiovascular;  Laterality: Left;  . LEFT HEART CATHETERIZATION WITH CORONARY ANGIOGRAM N/A 01/03/2013   Procedure: LEFT HEART CATHETERIZATION WITH CORONARY ANGIOGRAM;  Surgeon: Burnell Blanks, MD;  Location: [facility] CATH LAB;  Service: Cardiovascular;  Laterality: N/A;  . ORIF ANKLE FRACTURE Right 10/21/2009  . TUBAL LIGATION  1989     Allergies  Allergen Reactions  . Tape Rash and Other (See Comments)    Severe Reaction (latex tape): Burned hand, Redness-took days to clear up (03/02/12)  . Codeine Nausea And Vomiting  . Prozac [Fluoxetine Hcl] Other (See Comments)    Mean/violent thoughts  . Fosamax [Alendronate Sodium] Nausea Only and Other (See Comments)    Stomach pain  . Keflex [Cephalexin] Nausea Only  . Lipitor [Atorvastatin] Other (See Comments)    Leg pain      Family History  Problem Relation  Age of Onset  . Breast cancer Mother   . Heart failure Brother   . Breast cancer Sister   . Breast cancer Sister   . Uterine cancer Sister      Social History Ms. Liming reports that she has been smoking cigarettes. She started smoking about 27 years ago. She has a 10.00 pack-year smoking history. She has never used smokeless tobacco. Ms. Pucci reports current alcohol use.   Review of Systems CONSTITUTIONAL: No weight loss, fever, chills, weakness or fatigue.  HEENT: Eyes: No visual loss, blurred vision, double vision or yellow sclerae.No hearing loss, sneezing, congestion, runny nose or sore throat.  SKIN: No rash or itching.  CARDIOVASCULAR: per hpi RESPIRATORY: No shortness of breath, cough or sputum.  GASTROINTESTINAL: No anorexia, nausea, vomiting or diarrhea. No abdominal pain or blood.  GENITOURINARY: No burning on urination, no polyuria NEUROLOGICAL: No headache, dizziness, syncope, paralysis, ataxia, numbness or tingling in the extremities. No change in bowel or bladder control.  MUSCULOSKELETAL: No muscle, back pain, joint pain or stiffness.  LYMPHATICS: No enlarged nodes. No history of splenectomy.  PSYCHIATRIC: No history of depression or anxiety.  ENDOCRINOLOGIC: No reports of sweating, cold or heat intolerance. No polyuria or polydipsia.  Marland Kitchen   Physical Examination Vitals:   03/26/18 0940  BP: (!) 156/81  Pulse: 73  SpO2: 95%   Vitals:   03/26/18 0940  Weight: 175 lb 12.8 oz (79.7 kg)  Height: 5' (1.524 m)    Gen: resting comfortably, no acute distress HEENT: no scleral icterus, pupils equal round and reactive, no palptable cervical adenopathy,  CV: RRR, no m/r/g, no jvd Resp: Clear to auscultation bilaterally GI: abdomen is soft, non-tender, non-distended, normal bowel sounds, no hepatosplenomegaly MSK: extremities are warm, no edema.  Skin: warm, no rash Neuro:  no focal deficits Psych: appropriate affect   Diagnostic Studies  04/2010 Cath  [place], [place]: LVEF 55%, mild anterior hypokinesis. LM normal, LAD with stents in prox and mid portion widely patent, LAD ostial 30%. Diags with luminal irregs. LCX with luminial irregs. RCA with distal patent stents  Stent cards May 2008 DES to PDA, DES to LAD  Jan 2009 DES to prox LAD  Mar 05 2009 DES to LAD x2  05/2011 DES to RCA  Jan 2014 stent done [place], [place]  03/2010 Echo: LVEF 40%, hypokinesis of the anteroseptum.   01/02/13 Cath Hemodynamic Findings: Central aortic pressure: 147/79  Left ventricular pressure: 130/22/26  Angiographic Findings: Left main: 30% distal stenosis. It appears that the distal left main has a stent that continues into the LAD.  Left Anterior Descending Artery: Large vessel that courses to the apex. The entire proximal and mid LAD is stented. There is mild stent restenosis in the proximal segment. The mid stented segment has diffuse 30% stent restenosis. The distal vessel becomes small in caliber and has mild diffuse plaque. There is a moderate caliber first diagonal Becky [name] with mild plaque disease.  Circumflex Artery: Moderate caliber non-dominant vessel with three moderate caliber obtuse marginal branches. No obstructive disease.  Right Coronary Artery: Large dominant vessel with 30% proximal stenosis, heavily calcified mid vessel with patent stent in the mid vessel (no significant restenosis). The distal vessel has diffuse 40-50% stenosis. The posterolateral Becky [name] and PDA are moderate caliber, patent vessels with mild plaque disease.  Left Ventricular Angiogram: LVEF=25-30%. Hypokinesis of the antero-apical wall.  Impression:  1. 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 Elevated troponin following syncopal event. No culprit lesions seen.  3. Moderate to severe LV systolic dysfunction.  Recommendations: Continue medical management of CAD. Workup for syncope is underway. Carotid dopplers today. Will check  d-dimer as PE is a possibility. She could also have had an arrythmia with LV dysfunction.  Complications: None. The patient tolerated the procedure well.  01/03/13 Echo - LVEF 25-30%, akinesis of the anteroseptal and apical myocardium, grade I diastolic dysfunction,  12/75/17 Carotid US - The vertebral arteries appear patent with antegrade flow. - Findings consistent with1- 39 percent stenosis,high end of scale,involving the right internal carotid artery. - Findings consistent with 40 - 59 percent stenosis, low end of scale,involving the left internal carotid artery. - ICA/CCA ratio. right =1.72. left = 1.52 Other specific details can be found in the table(s) above. Prepared and Electronically Authenticated by 12/13/12 Event monitor No symptoms reported, sinus rhythm with conduction delay, occasional PVCs.  04/2013 Echo Study Conclusions  - Left ventricle: The cavity size was normal. Wall thickness was increased in a pattern of mild LVH with moderate basal septal hypertrophy. Systolic function was moderately to severely reduced. The estimated ejection fraction was in the range of 30% to 35%. There is akinesis to dyskinesis of the mid-distal anteroseptal and apical myocardium. There is akinesis of the distalinferoseptal myocardium. Doppler parameters are consistent with abnormal left ventricular relaxation (grade 1 diastolic dysfunction). - Ventricular septum: Septal motion showed abnormal function and dyssynergy. - Aortic valve: Mildly calcified annulus. Probably trileaflet; mildly calcified leaflets. No significant regurgitation. - Mitral valve: Calcified annulus. Trivial regurgitation. - Left atrium: The atrium was mildly dilated. - Right atrium: Central venous pressure: 69m Hg (est). - Tricuspid valve: Trivial regurgitation. - Pulmonary arteries: PA peak pressure: 266mHg (S). - Pericardium, extracardiac: There was no pericardial effusion. Impressions:  -  [place] moderate basal septal hypertrophy, LVEF 30-35% with wall motion abnormalities as outlined, grade 1  diastolic dysfunction. Mild left atrial enlagement. MAC with trivial mitral regurgitation. MIldlysclerotic aortic valve. Trivial tricuspid regurgitation with normal PASP 20 mmHg. 05/2014 echo Study Conclusions  - Left ventricle: Technically limited study. The cavity size was normal. Wall thickness was increased in a pattern of moderate LVH. The estimated ejection fraction was 55%. Doppler parameters are consistent with abnormal left ventricular relaxation (grade 1 diastolic dysfunction). - Aortic valve: Sclerosis without stenosis. - Right ventricle: The cavity size was normal. Pacer wire or catheter noted in right ventricle. Systolic function was normal. - Impressions: Since the study in 2015, there is definite improvement in LV function.  Impressions:  - Since the study in 2015, there is definite improvement in LV function.   09/2015 Nuclear Stress  There was no ST segment deviation noted during stress.  No T wave inversion was noted during stress.  Defect 1: There is a medium defect of mild severity present in the basal inferior and mid inferior location. This defect is likely a result of bowel loop attenuation artifact. No significant ischemia identified.  This is a low risk study. No significant ischemia identified.  Nuclear stress EF: 54%. Mid to apical septal wall hypokinesis noted   09/2015 echo Study Conclusions  - Left ventricle: The cavity size was normal. Systolic function was normal. The estimated ejection fraction was in the range of 55% to 60%. Wall motion was normal; there were no regional wall motion abnormalities. Doppler parameters are consistent with abnormal left ventricular relaxation (grade 1 diastolic dysfunction). Doppler parameters are consistent with indeterminate ventricular filling pressure. - Aortic  valve: Transvalvular velocity was within the normal range. There was no stenosis. There was no regurgitation. - Mitral valve: Transvalvular velocity was within the normal range. There was no evidence for stenosis. There was no regurgitation. - Left atrium: The atrium was moderately dilated. - Right ventricle: The cavity size was normal. Wall thickness was normal. Systolic function was normal. - Tricuspid valve: There was no regurgitation.      Assessment and Plan  1. ICM/Chronic systolic heart failure  - previous LVEF 30-35%. After BiV AICD repeat echo with normalized LVEF - medical therapy has been limited due to orthstatic symptoms. ACE-I previously stopped during 09/2015 admission due to hypotension.  - indefinite plavix due to large stent burden  - no symptoms, continue current meds    2. Hyperlipidemia - difficulty tolerating statins in the past, has tolerated pravastatin - reqyest labs from pcp, continue statin   3. HTN - manual recheck 124/75, she is at goal. Continue current meds   4. Teeth extraction - ok for cardiac standpoint, hold plavix starting 5 days prior. Resume day after    F/u 6 months    Arnoldo Lenis, M.D.

## 2018-03-26 NOTE — Patient Instructions (Signed)
Medication Instructions:  Continue all current medications.  Labwork:   Testing/Procedures:   Follow-Up: Your physician wants you to follow up in: 6 months.  You will receive a reminder letter in the mail one-two months in advance.  If you don't receive a letter, please call our office to schedule the follow up appointment   Any Other Special Instructions Will Be Listed Below (If Applicable).  If you need a refill on your cardiac medications before your next appointment, please call your pharmacy.  

## 2018-03-29 ENCOUNTER — Ambulatory Visit (INDEPENDENT_AMBULATORY_CARE_PROVIDER_SITE_OTHER): Payer: Medicare Other

## 2018-03-29 DIAGNOSIS — I255 Ischemic cardiomyopathy: Secondary | ICD-10-CM | POA: Diagnosis not present

## 2018-03-29 DIAGNOSIS — I5022 Chronic systolic (congestive) heart failure: Secondary | ICD-10-CM

## 2018-03-30 LAB — CUP PACEART REMOTE DEVICE CHECK
Battery Remaining Longevity: 13 mo
Battery Voltage: 2.89 V
Brady Statistic AS VP Percent: 84.08 %
Brady Statistic RA Percent Paced: 9.65 %
Date Time Interrogation Session: 20200210072604
HIGH POWER IMPEDANCE MEASURED VALUE: 84 Ohm
Implantable Lead Implant Date: 20150416
Implantable Lead Implant Date: 20150922
Implantable Lead Implant Date: 20150922
Implantable Lead Location: 753858
Implantable Lead Location: 753860
Implantable Lead Model: 4598
Implantable Lead Model: 6935
Lead Channel Impedance Value: 342 Ohm
Lead Channel Impedance Value: 380 Ohm
Lead Channel Impedance Value: 399 Ohm
Lead Channel Impedance Value: 399 Ohm
Lead Channel Impedance Value: 532 Ohm
Lead Channel Impedance Value: 703 Ohm
Lead Channel Pacing Threshold Pulse Width: 0.4 ms
Lead Channel Sensing Intrinsic Amplitude: 10 mV
Lead Channel Sensing Intrinsic Amplitude: 10 mV
Lead Channel Sensing Intrinsic Amplitude: 2.5 mV
Lead Channel Setting Pacing Amplitude: 2 V
Lead Channel Setting Pacing Amplitude: 3.5 V
Lead Channel Setting Pacing Pulse Width: 1 ms
MDC IDC LEAD LOCATION: 753859
MDC IDC MSMT LEADCHNL LV IMPEDANCE VALUE: 323 Ohm
MDC IDC MSMT LEADCHNL LV IMPEDANCE VALUE: 323 Ohm
MDC IDC MSMT LEADCHNL LV IMPEDANCE VALUE: 456 Ohm
MDC IDC MSMT LEADCHNL LV IMPEDANCE VALUE: 570 Ohm
MDC IDC MSMT LEADCHNL LV IMPEDANCE VALUE: 665 Ohm
MDC IDC MSMT LEADCHNL LV IMPEDANCE VALUE: 665 Ohm
MDC IDC MSMT LEADCHNL LV PACING THRESHOLD AMPLITUDE: 3 V
MDC IDC MSMT LEADCHNL LV PACING THRESHOLD PULSEWIDTH: 1 ms
MDC IDC MSMT LEADCHNL RA PACING THRESHOLD AMPLITUDE: 0.625 V
MDC IDC MSMT LEADCHNL RA SENSING INTR AMPL: 2.5 mV
MDC IDC MSMT LEADCHNL RV IMPEDANCE VALUE: 266 Ohm
MDC IDC MSMT LEADCHNL RV PACING THRESHOLD AMPLITUDE: 0.875 V
MDC IDC MSMT LEADCHNL RV PACING THRESHOLD PULSEWIDTH: 0.4 ms
MDC IDC PG IMPLANT DT: 20150416
MDC IDC SET LEADCHNL RV PACING AMPLITUDE: 2.25 V
MDC IDC SET LEADCHNL RV PACING PULSEWIDTH: 0.4 ms
MDC IDC SET LEADCHNL RV SENSING SENSITIVITY: 0.3 mV
MDC IDC STAT BRADY AP VP PERCENT: 9.68 %
MDC IDC STAT BRADY AP VS PERCENT: 0.14 %
MDC IDC STAT BRADY AS VS PERCENT: 6.1 %
MDC IDC STAT BRADY RV PERCENT PACED: 6.13 %

## 2018-04-09 NOTE — Progress Notes (Signed)
Remote ICD transmission.   

## 2018-04-13 ENCOUNTER — Ambulatory Visit (INDEPENDENT_AMBULATORY_CARE_PROVIDER_SITE_OTHER): Payer: Medicare Other

## 2018-04-13 DIAGNOSIS — I5022 Chronic systolic (congestive) heart failure: Secondary | ICD-10-CM

## 2018-04-13 DIAGNOSIS — Z95810 Presence of automatic (implantable) cardiac defibrillator: Secondary | ICD-10-CM

## 2018-04-14 ENCOUNTER — Telehealth: Payer: Self-pay

## 2018-04-14 NOTE — Progress Notes (Signed)
EPIC Encounter for ICM Monitoring  Patient Name: Becky [name] is a 68 y.o. female Date: 04/14/2018 Primary Care Physican: Joseph Art, MD Primary Cardiologist:Branch Electrophysiologist: Allred Bi-V Pacing:94% Last Weight:170lbs (baseline) Today's Weight:unknown    Attempted call to patient.  Left detailed message per DPR regarding transmission. Transmission reviewed.   Thoracic impedanceabnormal suggesting fluid accumulation starting 04/07/2018.  Prescribed:Furosemide 40 mg 1.5 tablets (60 mg total) daily and may take an extra 60 mg as needed. Potassium 20 mEq 1 tablet every other day.   Labs: 01/03/2017 Creatinine 1.09, BUN 18. Potassium 3.8, Sodium 140, EGFR 53-61 05/05/2016 Creatinine 1.05, BUN 19, Potassium 3.6, Sodium 135, EGFR 64 05/04/2016 Creatinine 0.93, BUN 21, Potassium 3.0, Sodium 133, EGFR 75 05/03/2016 Creatinine 0.93, BUN 24, Potassium 3.5, Sodium 134, EGFR 75 03/11/2016 Creatinine 0.98, BUN 17, Potassium 2.9, Sodium 136  Recommendations:Left voice mail with ICM number and encouraged to call if experiencing any fluid symptoms.  Follow-up plan: ICM clinic phone appointment on2/28/2020 (manual send) to recheck fluid levels.   Copy of ICM check sent to Dr.Allred and Dr Harl Bowie.  3 month ICM trend: 04/13/2018    1 Year ICM trend:       Rosalene Billings, RN 04/14/2018 9:05 AM

## 2018-04-14 NOTE — Telephone Encounter (Signed)
Remote ICM transmission received.  Attempted call to patient regarding ICM remote transmission and left detailed message, per DPR, to return call.    

## 2018-04-16 ENCOUNTER — Ambulatory Visit (INDEPENDENT_AMBULATORY_CARE_PROVIDER_SITE_OTHER): Payer: Medicare Other

## 2018-04-16 DIAGNOSIS — I5022 Chronic systolic (congestive) heart failure: Secondary | ICD-10-CM

## 2018-04-16 DIAGNOSIS — Z95810 Presence of automatic (implantable) cardiac defibrillator: Secondary | ICD-10-CM

## 2018-04-16 NOTE — Progress Notes (Signed)
EPIC Encounter for ICM Monitoring  Patient Name: Becky [name] is a 68 y.o. female Date: 04/16/2018 Primary Care Physican: Joseph Art, MD Primary Cardiologist:Branch Electrophysiologist: Allred Bi-V Pacing:94% Last Weight:170lbs (baseline) Today's Weight:176 lbs    Heart failure questions reviewed.  She said she thinks she is retaining of fluid because she drinks a lot of water due to dry mouth.  Thoracic impedanceabnormal suggesting fluid accumulation starting 04/07/2018.  Prescribed:Furosemide 40 mg 1.5 tablets (60 mg total) daily and may take an extra 60 mg as needed. Potassium 20 mEq 1 tablet every other day.   Labs: 01/03/2017 Creatinine 1.09, BUN 18. Potassium 3.8, Sodium 140, EGFR 53-61 05/05/2016 Creatinine 1.05, BUN 19, Potassium 3.6, Sodium 135, EGFR 64 05/04/2016 Creatinine 0.93, BUN 21, Potassium 3.0, Sodium 133, EGFR 75 05/03/2016 Creatinine 0.93, BUN 24, Potassium 3.5, Sodium 134, EGFR 75 03/11/2016 Creatinine 0.98, BUN 17, Potassium 2.9, Sodium 136  Recommendations: She stated she will take extra Furosemide as prescribed for a couple of days.  Discussed limiting salt intake and fluid intake.   Follow-up plan: ICM clinic phone appointment on 04/22/2018 to recheck fluid levels.   Copy of ICM check sent to Dr.Allred and Dr Harl Bowie.  3 month ICM trend: 04/16/2018    1 Year ICM trend:       Rosalene Billings, RN 04/16/2018 9:46 AM

## 2018-04-18 ENCOUNTER — Other Ambulatory Visit: Payer: Self-pay | Admitting: Cardiology

## 2018-04-22 ENCOUNTER — Ambulatory Visit (INDEPENDENT_AMBULATORY_CARE_PROVIDER_SITE_OTHER): Payer: Medicare Other

## 2018-04-22 DIAGNOSIS — Z95810 Presence of automatic (implantable) cardiac defibrillator: Secondary | ICD-10-CM

## 2018-04-22 DIAGNOSIS — I5022 Chronic systolic (congestive) heart failure: Secondary | ICD-10-CM

## 2018-04-23 NOTE — Progress Notes (Signed)
EPIC Encounter for ICM Monitoring  Patient Name: Becky [name] is a 68 y.o. female Date: 04/23/2018 Primary Care Physican: Aaron, Caren T, MD Primary Cardiologist:Branch Electrophysiologist: Allred Bi-V Pacing:93% Last Weight:170lbs (baseline) Today's Weight:173 lbs    Heart failure questions reviewed.  Pt is doing fine and denied any fluid symptoms.   Thoracic impedancereturned to normal after taking extra Furosemide for a couple of days.   Prescribed:Furosemide 40 mg 1.5 tablets (60 mg total) daily and may take an extra 60 mg as needed. Potassium 20 mEq 1 tablet every other day.   Labs: 01/03/2017 Creatinine 1.09, BUN 18. Potassium 3.8, Sodium 140, EGFR 53-61 05/05/2016 Creatinine 1.05, BUN 19, Potassium 3.6, Sodium 135, EGFR 64 05/04/2016 Creatinine 0.93, BUN 21, Potassium 3.0, Sodium 133, EGFR 75 05/03/2016 Creatinine 0.93, BUN 24, Potassium 3.5, Sodium 134, EGFR 75 03/11/2016 Creatinine 0.98, BUN 17, Potassium 2.9, Sodium 136  Recommendations: No changes and encouraged to call for fluid symptoms.  Follow-up plan: ICM clinic phone appointment on 05/24/2018.   Copy of ICM check sent to Dr.Allred.  3 month ICM trend: 04/22/2018    1 Year ICM trend:       Laurie S Short, RN 04/23/2018 12:43 PM   

## 2018-05-23 ENCOUNTER — Other Ambulatory Visit: Payer: Self-pay | Admitting: Internal Medicine

## 2018-05-24 ENCOUNTER — Ambulatory Visit (INDEPENDENT_AMBULATORY_CARE_PROVIDER_SITE_OTHER): Payer: Medicare Other

## 2018-05-24 ENCOUNTER — Other Ambulatory Visit: Payer: Self-pay

## 2018-05-24 DIAGNOSIS — Z95810 Presence of automatic (implantable) cardiac defibrillator: Secondary | ICD-10-CM | POA: Diagnosis not present

## 2018-05-24 DIAGNOSIS — I5022 Chronic systolic (congestive) heart failure: Secondary | ICD-10-CM

## 2018-05-25 NOTE — Progress Notes (Signed)
EPIC Encounter for ICM Monitoring  Patient Name: Becky [name] is a 68 y.o. female Date: 05/25/2018 Primary Care Physican: Joseph Art, MD Primary Cardiologist:Branch Electrophysiologist: Allred Bi-V Pacing:91.2% Last Weight:170lbs (baseline) 05/25/2018 Weight:173 lbs   Heart failure questions reviewed. Pt is doing fine and denied any fluid symptoms.  Patient had labs drawn about 6 months ago that were ordered by Dr Marjory Lies.  She said they have a lab in [facility] building but she was unsure if [facility]     Thoracic impedancenormal but was abnormal from 3/4-3/18  Prescribed:Furosemide 40 mg 1.5 tablets (60 mg total) daily and may take an extra 60 mg as needed. Potassium 20 mEq 1 tablet every other day.   Labs: 01/03/2017 Creatinine 1.09, BUN 18. Potassium 3.8, Sodium 140, EGFR 53-61 05/05/2016 Creatinine 1.05, BUN 19, Potassium 3.6, Sodium 135, EGFR 64 05/04/2016 Creatinine 0.93, BUN 21, Potassium 3.0, Sodium 133, EGFR 75 05/03/2016 Creatinine 0.93, BUN 24, Potassium 3.5, Sodium 134, EGFR 75 03/11/2016 Creatinine 0.98, BUN 17, Potassium 2.9, Sodium 136  Recommendations: No changes and encouraged to call for fluid symptoms.  Follow-up plan: ICM clinic phone appointment on5/01/2019.   Copy of ICM check sent to Dr.Allred.  3 month ICM trend: 05/24/2018    1 Year ICM trend:       Rosalene Billings, RN 05/25/2018 2:07 PM

## 2018-06-16 ENCOUNTER — Telehealth: Payer: Self-pay

## 2018-06-16 NOTE — Telephone Encounter (Signed)
Left message regarding appt on 06/18/18. 

## 2018-06-17 NOTE — Telephone Encounter (Signed)
Follow up   Patient is returning your call about appointment.

## 2018-06-17 NOTE — Telephone Encounter (Signed)
Spoke With pt regarding appt on 06/18/18. Pt was advise to check vitals prior to appt. Pt concerns were address.

## 2018-06-18 ENCOUNTER — Telehealth (INDEPENDENT_AMBULATORY_CARE_PROVIDER_SITE_OTHER): Payer: Medicare Other | Admitting: Internal Medicine

## 2018-06-18 VITALS — BP 137/72 | HR 78 | Wt 170.0 lb

## 2018-06-18 DIAGNOSIS — I5022 Chronic systolic (congestive) heart failure: Secondary | ICD-10-CM | POA: Diagnosis not present

## 2018-06-18 DIAGNOSIS — Z95810 Presence of automatic (implantable) cardiac defibrillator: Secondary | ICD-10-CM | POA: Diagnosis not present

## 2018-06-18 DIAGNOSIS — I255 Ischemic cardiomyopathy: Secondary | ICD-10-CM

## 2018-06-18 DIAGNOSIS — I2510 Atherosclerotic heart disease of native coronary artery without angina pectoris: Secondary | ICD-10-CM | POA: Diagnosis not present

## 2018-06-18 DIAGNOSIS — Z720 Tobacco use: Secondary | ICD-10-CM

## 2018-06-18 NOTE — Progress Notes (Signed)
Electrophysiology TeleHealth Note   Due to national recommendations of social distancing due to COVID 19, an audio/video telehealth visit is felt to be most appropriate for this patient at this time.  See MyChart message from today for the patient's consent to telehealth for [facility].   Date:  06/18/2018   ID:  Becky [name], DOB 1950-10-12, MRN [id number]  Location: patient's home  Provider location:   [facility]  Evaluation Performed: Follow-up visit  PCP:  Bedelia Person, MD  Cardiologist:  Dina Rich, MD Electrophysiologist:  Dr Johney Frame  Chief Complaint:  SOB  History of Present Illness:    [name] is a 68 y.o. female who presents via audio/video conferencing for a telehealth visit today.  Since last being seen in our clinic, the patient reports doing very well.  Her SOB is much improved. Today, she denies symptoms of palpitations, chest pain, lower extremity edema, dizziness, presyncope, or syncope.  The patient is otherwise without complaint today.  The patient denies symptoms of fevers, chills, cough, or new SOB worrisome for COVID 19.  Past Medical History:  Diagnosis Date  . AICD (automatic cardioverter/defibrillator) present   . Anxiety   . Arthritis    "all my joints; neck, back, legs, arms, elbows"  (10/03/2015)  . Asthma   . CAD (coronary artery disease)    a. 5/08: s/p DES to PDA and DES to LAD; b. 1/09: s/p DES to pLAD; c. 1/11: s/p DES to LAD x 2, d. LHC (3/12 in [place] - EF 55%, mild ant HK, nl LM, LAD stents ok, oLAD 30, RCA stents ok;  e.  4/13:  s/p DES to RCA, f. 1/14: dLM 20-30, mOM3 20-30, mLAD 80 ISR => s/p 2.5x16 mm Promus Element DES; EF 45-50%   . CHF (congestive heart failure) (HCC)   . Chronic bronchitis (HCC)   . Chronic lower back pain    "into pelvis region" (10/03/2015)  . COPD (chronic obstructive pulmonary disease) (HCC)   . Daily headache    "might be from sinus" (10/03/2015)  . Depression    hx (10/03/2015)  .  Fibromyalgia   . GERD (gastroesophageal reflux disease)   . Heart murmur   . HLD (hyperlipidemia)   . Hypertension   . Ischemic cardiomyopathy    a. Echo (11/14):  EF 25%   . LBBB (left bundle branch block)   . Myocardial infarction (HCC) 12/2012  . OSA on CPAP   . Pneumonia 1950s  . Syncope 11/14   LifeVest placed  . Upper back pain, chronic    "into my shoulders; fibromyalgia works here" (10/03/2015)    Past Surgical History:  Procedure Laterality Date  . BI-VENTRICULAR IMPLANTABLE CARDIOVERTER DEFIBRILLATOR N/A 06/02/2013   Procedure: BI-VENTRICULAR IMPLANTABLE CARDIOVERTER DEFIBRILLATOR  (CRT-D);  Surgeon: Gardiner Rhyme, MD;  Location: [facility] CATH LAB;  Service: Cardiovascular;  Laterality: N/A;  . BI-VENTRICULAR IMPLANTABLE CARDIOVERTER DEFIBRILLATOR  (CRT-D)  06/02/2013   MDT VivaQuad CRTD implanted by Dr Johney Frame for ICM, CHF  . CARDIAC CATHETERIZATION  12/2012   S/P MI  . CORONARY ANGIOPLASTY WITH STENT PLACEMENT  2007; 2009; 2011   "?2 +1 +1 "  . CORONARY ANGIOPLASTY WITH STENT PLACEMENT  02/2012  . FRACTURE SURGERY    . HYSTEROSCOPY W/ ENDOMETRIAL ABLATION  ~ 1998  . LEAD REVISION  11-08-13   RA and LV lead revision by Dr Johney Frame  . LEAD REVISION Left 11/08/2013   Procedure: LEAD REVISION;  Surgeon: Fayrene Fearing  D Jlyn Cerros, MD;  Location: [facility] CATH LAB;  Service: Cardiovascular;  Laterality: Left;  . LEFT HEART CATHETERIZATION WITH CORONARY ANGIOGRAM N/A 01/03/2013   Procedure: LEFT HEART CATHETERIZATION WITH CORONARY ANGIOGRAM;  Surgeon: Kathleene Hazel, MD;  Location: [facility] CATH LAB;  Service: Cardiovascular;  Laterality: N/A;  . ORIF ANKLE FRACTURE Right 10/21/2009  . TUBAL LIGATION  1989    Current Outpatient Medications  Medication Sig Dispense Refill  . acetaminophen-codeine (TYLENOL #3) 300-30 MG tablet Take 1 tablet by mouth every 6 (six) hours as needed for moderate pain.   2  . aspirin EC 81 MG tablet Take 1 tablet (81 mg total) by mouth daily.    .  Aspirin-Salicylamide-Caffeine (BC HEADACHE PO) Take 1 each by mouth as needed.     . calcium carbonate (CALCIUM 600) 1500 (600 Ca) MG TABS tablet Take 600 mg of elemental calcium by mouth daily with breakfast.    . cetirizine (ZYRTEC) 10 MG tablet Take 1 tablet by mouth daily.  4  . Cholecalciferol (VITAMIN D) 2000 UNITS CAPS Take 2,000 Units by mouth daily.    . clopidogrel (PLAVIX) 75 MG tablet Take 75 mg by mouth daily.     Marland Kitchen escitalopram (LEXAPRO) 20 MG tablet Take 20 mg by mouth daily.     . fluticasone (FLONASE) 50 MCG/ACT nasal spray Place 2 sprays into both nostrils daily.  7  . formoterol (PERFOROMIST) 20 MCG/2ML nebulizer solution Take 20 mcg by nebulization 2 (two) times daily.    . furosemide (LASIX) 40 MG tablet Take 80 mg by mouth daily.    Marland Kitchen gabapentin (NEURONTIN) 300 MG capsule Take 1 capsule by mouth 3 (three) times daily.    Marland Kitchen ipratropium-albuterol (DUONEB) 0.5-2.5 (3) MG/3ML SOLN Take 3 mLs by nebulization every 6 (six) hours as needed (shortness of breath).    . lactulose (CHRONULAC) 10 GM/15ML solution Take 10 g by mouth 2 (two) times daily as needed for mild constipation.    . magnesium oxide (MAG-OX) 400 MG tablet Take 400 mg by mouth daily.    . metFORMIN (GLUCOPHAGE) 500 MG tablet Take 1,000 mg by mouth 2 (two) times daily with a meal.     . metoprolol succinate (TOPROL-XL) 50 MG 24 hr tablet TAKE 1 TABLET BY MOUTH DAILY WITH OR IMMEDIATELY FOLLOWING A MEAL(DOSE DECREASE) 90 tablet 3  . montelukast (SINGULAIR) 10 MG tablet Take 10 mg by mouth every morning.     . nitroGLYCERIN (NITROSTAT) 0.4 MG SL tablet Place 1 tablet (0.4 mg total) under the tongue every 5 (five) minutes x 3 doses as needed. If no relief after 3rd dose, proceed to ED 25 tablet 5  . pantoprazole (PROTONIX) 40 MG tablet TAKE 1 TABLET BY MOUTH DAILY 90 tablet 1  . pravastatin (PRAVACHOL) 40 MG tablet Take 40 mg by mouth daily.    Marland Kitchen rOPINIRole (REQUIP) 0.5 MG tablet Take 0.5 mg by mouth at bedtime.     Marland Kitchen  spironolactone (ALDACTONE) 25 MG tablet TAKE 1/2 TABLET BY MOUTH DAILY 45 tablet 3   No current facility-administered medications for this visit.     Allergies:   Tape; Codeine; Prozac [fluoxetine hcl]; Fosamax [alendronate sodium]; Keflex [cephalexin]; and Lipitor [atorvastatin]   Social History:  The patient  reports that she has been smoking cigarettes. She started smoking about 27 years ago. She has a 10.00 pack-year smoking history. She has never used smokeless tobacco. She reports current alcohol use. She reports that she does not use drugs.  Family History:  The patient's  family history includes Breast cancer in her mother, sister, and sister; Heart failure in her brother; Uterine cancer in her sister.   ROS:  Please see the history of present illness.   All other systems are personally reviewed and negative.    Exam:    Vital Signs:  BP 137/72   Pulse 78   Wt 170 lb (77.1 kg)   BMI 33.20 kg/m   Well appearing, alert and conversant, regular work of breathing,  good skin color Eyes- anicteric, neuro- grossly intact, skin- no apparent rash or lesions or cyanosis, mouth- oral mucosa is pink   Labs/Other Tests and Data Reviewed:    Recent Labs: No results found for requested labs within last 8760 hours.   Wt Readings from Last 3 Encounters:  06/18/18 170 lb (77.1 kg)  03/26/18 175 lb 12.8 oz (79.7 kg)  08/31/17 170 lb 9.6 oz (77.4 kg)     Other studies personally reviewed: Additional studies/ records that were reviewed today include: Dr Verna Czech notes, my prior notes  Review of the above records today demonstrates: as above  Last device remote is reviewed from [employer] PDF dated 03/29/2018 which reveals normal device function, no arrhythmias    ASSESSMENT & PLAN:    1.  Chronic systolic dysfunction/ ischemic CM/ CAD/ LBBB Doing well with CRT remote from 2/20 reveals normal device function She is uptodate on remotes Followed by Randon Goldsmith in [facility] clinic  2.  Tobacco Cessation advised She is not ready to quit  3. Overweight Lifestyle modification encouraged  4. OSA Compliant with CPAP is encouraged  5. COVID 19 screen The patient denies symptoms of COVID 19 at this time.  The importance of social distancing was discussed today.  Follow-up:  12 months with me Next remote: 06/2018  Current medicines are reviewed at length with the patient today.   The patient does not have concerns regarding her medicines.  The following changes were made today:  none  Labs/ tests ordered today include:  No orders of the defined types were placed in this encounter.  Patient Risk:  after full review of this patients clinical status, I feel that they are at moderate risk at this time.  Today, I have spent 15 minutes with the patient with telehealth technology discussing  CHF .    Randolm Idol, MD  06/18/2018 3:30 PM     [facility] [address] [place] [address] [phone] (office) [phone] (fax)

## 2018-06-21 ENCOUNTER — Encounter: Payer: Self-pay | Admitting: Cardiology

## 2018-06-28 ENCOUNTER — Ambulatory Visit (INDEPENDENT_AMBULATORY_CARE_PROVIDER_SITE_OTHER): Payer: Medicare Other | Admitting: *Deleted

## 2018-06-28 ENCOUNTER — Other Ambulatory Visit: Payer: Self-pay

## 2018-06-28 DIAGNOSIS — I5022 Chronic systolic (congestive) heart failure: Secondary | ICD-10-CM | POA: Diagnosis not present

## 2018-06-28 DIAGNOSIS — I255 Ischemic cardiomyopathy: Secondary | ICD-10-CM

## 2018-06-29 ENCOUNTER — Other Ambulatory Visit: Payer: Self-pay

## 2018-06-29 ENCOUNTER — Ambulatory Visit (INDEPENDENT_AMBULATORY_CARE_PROVIDER_SITE_OTHER): Payer: Medicare Other

## 2018-06-29 DIAGNOSIS — Z95810 Presence of automatic (implantable) cardiac defibrillator: Secondary | ICD-10-CM

## 2018-06-29 DIAGNOSIS — I5022 Chronic systolic (congestive) heart failure: Secondary | ICD-10-CM | POA: Diagnosis not present

## 2018-06-29 LAB — CUP PACEART REMOTE DEVICE CHECK
Battery Remaining Longevity: 11 mo
Battery Voltage: 2.86 V
Brady Statistic AP VP Percent: 7.75 %
Brady Statistic AP VS Percent: 0.12 %
Brady Statistic AS VP Percent: 87.97 %
Brady Statistic AS VS Percent: 4.15 %
Brady Statistic RA Percent Paced: 7.8 %
Brady Statistic RV Percent Paced: 2.39 %
Date Time Interrogation Session: 20200511073425
HighPow Impedance: 77 Ohm
Implantable Lead Implant Date: 20150416
Implantable Lead Implant Date: 20150922
Implantable Lead Implant Date: 20150922
Implantable Lead Location: 753858
Implantable Lead Location: 753859
Implantable Lead Location: 753860
Implantable Lead Model: 4598
Implantable Lead Model: 5076
Implantable Lead Model: 6935
Implantable Pulse Generator Implant Date: 20150416
Lead Channel Impedance Value: 266 Ohm
Lead Channel Impedance Value: 304 Ohm
Lead Channel Impedance Value: 323 Ohm
Lead Channel Impedance Value: 323 Ohm
Lead Channel Impedance Value: 323 Ohm
Lead Channel Impedance Value: 342 Ohm
Lead Channel Impedance Value: 380 Ohm
Lead Channel Impedance Value: 494 Ohm
Lead Channel Impedance Value: 494 Ohm
Lead Channel Impedance Value: 513 Ohm
Lead Channel Impedance Value: 646 Ohm
Lead Channel Impedance Value: 665 Ohm
Lead Channel Impedance Value: 665 Ohm
Lead Channel Pacing Threshold Amplitude: 0.625 V
Lead Channel Pacing Threshold Amplitude: 1.125 V
Lead Channel Pacing Threshold Amplitude: 2.5 V
Lead Channel Pacing Threshold Pulse Width: 0.4 ms
Lead Channel Pacing Threshold Pulse Width: 0.4 ms
Lead Channel Pacing Threshold Pulse Width: 1 ms
Lead Channel Sensing Intrinsic Amplitude: 2.625 mV
Lead Channel Sensing Intrinsic Amplitude: 2.625 mV
Lead Channel Sensing Intrinsic Amplitude: 6.875 mV
Lead Channel Sensing Intrinsic Amplitude: 6.875 mV
Lead Channel Setting Pacing Amplitude: 2 V
Lead Channel Setting Pacing Amplitude: 2.5 V
Lead Channel Setting Pacing Amplitude: 3.5 V
Lead Channel Setting Pacing Pulse Width: 0.4 ms
Lead Channel Setting Pacing Pulse Width: 1 ms
Lead Channel Setting Sensing Sensitivity: 0.3 mV

## 2018-07-02 NOTE — Progress Notes (Signed)
EPIC Encounter for ICM Monitoring  Patient Name: Becky [name] is a 68 y.o. female Date: 07/02/2018 Primary Care Physican: Joseph Art, MD Primary Cardiologist:Branch Electrophysiologist: Allred Bi-V Pacing:91.2% Last Weight:170lbs (baseline) 05/25/2018 Weight:171lbs   Heart failure questions reviewed.Pt isdoing fine and denied any fluid symptoms.   She took extra extra Furosemide during decreased impedance.     Optivol Thoracic impedancenormal.  Prescribed:Furosemide 40 mg 1.5 tablets (60 mg total) daily and may take an extra 60 mg as needed. Potassium 20 mEq 1 tablet every other day.   Labs: 01/03/2017 Creatinine 1.09, BUN 18. Potassium 3.8, Sodium 140, EGFR 53-61 05/05/2016 Creatinine 1.05, BUN 19, Potassium 3.6, Sodium 135, EGFR 64 05/04/2016 Creatinine 0.93, BUN 21, Potassium 3.0, Sodium 133, EGFR 75 05/03/2016 Creatinine 0.93, BUN 24, Potassium 3.5, Sodium 134, EGFR 75 03/11/2016 Creatinine 0.98, BUN 17, Potassium 2.9, Sodium 136  Recommendations: No changes and encouraged to call for fluid symptoms.  Follow-up plan: ICM clinic phone appointment on6/15/2020.   Copy of ICM check sent to Dr.Allred.   3 month ICM trend: 06/28/2018    1 Year ICM trend:       Rosalene Billings, RN 07/02/2018 11:37 AM

## 2018-07-07 NOTE — Progress Notes (Signed)
Remote ICD transmission.   

## 2018-08-02 ENCOUNTER — Ambulatory Visit (INDEPENDENT_AMBULATORY_CARE_PROVIDER_SITE_OTHER): Payer: Medicare Other

## 2018-08-02 DIAGNOSIS — I5022 Chronic systolic (congestive) heart failure: Secondary | ICD-10-CM

## 2018-08-02 DIAGNOSIS — Z95810 Presence of automatic (implantable) cardiac defibrillator: Secondary | ICD-10-CM | POA: Diagnosis not present

## 2018-08-02 NOTE — Progress Notes (Signed)
EPIC Encounter for ICM Monitoring  Patient Name: Becky [name] is a 68 y.o. female Date: 08/02/2018 Primary Care Physican: Joseph Art, MD Primary Cardiologist:Branch Electrophysiologist: Allred Bi-V Pacing:97.3% Baseline Weight:170lbs  4/7/2020Weight:171lbs 08/02/2018 Weight:  170 lbs   Heart failure questions reviewed.Pt has a little stomach bloating but otherwise feels fine.  Weight is stable at baseline.   Optivol Thoracic impedancesuggesting possible fluid accumulation starting 6/11 and is ongoing.  Prescribed:Furosemide 40 mg 1.5 tablets (60 mg total) daily and may take an extra 60 mg as needed. Potassium 20 mEq 1 tablet every other day.   Labs: 01/03/2017 Creatinine 1.09, BUN 18. Potassium 3.8, Sodium 140, EGFR 53-61 05/05/2016 Creatinine 1.05, BUN 19, Potassium 3.6, Sodium 135, EGFR 64 05/04/2016 Creatinine 0.93, BUN 21, Potassium 3.0, Sodium 133, EGFR 75 05/03/2016 Creatinine 0.93, BUN 24, Potassium 3.5, Sodium 134, EGFR 75 03/11/2016 Creatinine 0.98, BUN 17, Potassium 2.9, Sodium 136  Recommendations: She will take extra Furosemide 60 mg x 2 days and then return to prescribed dosage.   Follow-up plan: ICM clinic phone appointment on6/19/2020 (manual send) to recheck fluid levels.   Copy of ICM check sent to Dr.Allred and Dr Harl Bowie.   3 month ICM trend: 08/02/2018    1 Year ICM trend:       Rosalene Billings, RN 08/02/2018 3:14 PM

## 2018-08-06 ENCOUNTER — Ambulatory Visit (INDEPENDENT_AMBULATORY_CARE_PROVIDER_SITE_OTHER): Payer: Medicare Other

## 2018-08-06 DIAGNOSIS — Z95810 Presence of automatic (implantable) cardiac defibrillator: Secondary | ICD-10-CM

## 2018-08-06 DIAGNOSIS — I5022 Chronic systolic (congestive) heart failure: Secondary | ICD-10-CM

## 2018-08-06 NOTE — Progress Notes (Signed)
EPIC Encounter for ICM Monitoring  Patient Name: Becky [name] is a 68 y.o. female Date: 08/06/2018 Primary Care Physican: Joseph Art, MD Primary Cardiologist:Branch Electrophysiologist: Allred Bi-V Pacing:98.2% Baseline Weight:170lbs  4/7/2020Weight:171lbs 08/02/2018 Weight:  170 lbs   Heart failure questions reviewed.Pt says she is feeling fine.  OptivolThoracic impedance returned to normal since last remote transmission and after taking extra Furosemide x 2 days.   Prescribed:Furosemide 40 mg 1.5 tablets (60 mg total) daily and may take an extra 60 mg as needed. Potassium 20 mEq 1 tablet every other day.   Labs: 01/03/2017 Creatinine 1.09, BUN 18. Potassium 3.8, Sodium 140, EGFR 53-61 05/05/2016 Creatinine 1.05, BUN 19, Potassium 3.6, Sodium 135, EGFR 64 05/04/2016 Creatinine 0.93, BUN 21, Potassium 3.0, Sodium 133, EGFR 75 05/03/2016 Creatinine 0.93, BUN 24, Potassium 3.5, Sodium 134, EGFR 75 03/11/2016 Creatinine 0.98, BUN 17, Potassium 2.9, Sodium 136  Recommendations: No changes and encouraged to call if experiencing fluid symptoms.  Follow-up plan: ICM clinic phone appointment on7/20/2020.   Copy of ICM check sent to Dr.Allred.  3 month ICM trend: 08/06/2018    1 Year ICM trend:       Rosalene Billings, RN 08/06/2018 5:01 PM

## 2018-08-26 ENCOUNTER — Other Ambulatory Visit: Payer: Self-pay | Admitting: Cardiology

## 2018-09-06 ENCOUNTER — Ambulatory Visit (INDEPENDENT_AMBULATORY_CARE_PROVIDER_SITE_OTHER): Payer: Medicare Other

## 2018-09-06 DIAGNOSIS — Z95810 Presence of automatic (implantable) cardiac defibrillator: Secondary | ICD-10-CM | POA: Diagnosis not present

## 2018-09-06 DIAGNOSIS — I5022 Chronic systolic (congestive) heart failure: Secondary | ICD-10-CM

## 2018-09-07 NOTE — Progress Notes (Signed)
EPIC Encounter for ICM Monitoring  Patient Name: Becky [name] is a 68 y.o. female Date: 09/07/2018 Primary Care Physican: Joseph Art, MD Primary Cardiologist:Branch Electrophysiologist: Allred Bi-V Pacing:98.2% 08/02/2018 Weight: 170 lbs 09/08/2018 Weight: 167 lbs   Heart failure questions reviewed.Pt says she is feeling fine.  OptivolThoracic impedance returned to normal since last remote transmission and after taking extra Furosemide x 2 days.   Prescribed:Furosemide 40 mg 1.5 tablets (60 mg total) daily and may take an extra 60 mg as needed. Potassium 20 mEq 1 tablet every other day.   Recommendations: No changes and encouraged to call if experiencing fluid symptoms.  Follow-up plan: ICM clinic phone appointment on8/24/2020.   Copy of ICM check sent to Dr.Allred.  3 month ICM trend: 09/06/2018    1 Year ICM trend:       Rosalene Billings, RN 09/07/2018 5:08 PM

## 2018-09-25 ENCOUNTER — Other Ambulatory Visit: Payer: Self-pay | Admitting: Cardiology

## 2018-09-27 ENCOUNTER — Ambulatory Visit (INDEPENDENT_AMBULATORY_CARE_PROVIDER_SITE_OTHER): Payer: Medicare Other | Admitting: *Deleted

## 2018-09-27 DIAGNOSIS — I255 Ischemic cardiomyopathy: Secondary | ICD-10-CM | POA: Diagnosis not present

## 2018-09-27 LAB — CUP PACEART REMOTE DEVICE CHECK
Battery Remaining Longevity: 7 mo
Battery Voltage: 2.83 V
Brady Statistic AP VP Percent: 23.77 %
Brady Statistic AP VS Percent: 0.37 %
Brady Statistic AS VP Percent: 72.58 %
Brady Statistic AS VS Percent: 3.28 %
Brady Statistic RA Percent Paced: 23.64 %
Brady Statistic RV Percent Paced: 4.51 %
Date Time Interrogation Session: 20200810073324
HighPow Impedance: 89 Ohm
Implantable Lead Implant Date: 20150416
Implantable Lead Implant Date: 20150922
Implantable Lead Implant Date: 20150922
Implantable Lead Location: 753858
Implantable Lead Location: 753859
Implantable Lead Location: 753860
Implantable Lead Model: 4598
Implantable Lead Model: 5076
Implantable Lead Model: 6935
Implantable Pulse Generator Implant Date: 20150416
Lead Channel Impedance Value: 266 Ohm
Lead Channel Impedance Value: 323 Ohm
Lead Channel Impedance Value: 323 Ohm
Lead Channel Impedance Value: 342 Ohm
Lead Channel Impedance Value: 342 Ohm
Lead Channel Impedance Value: 399 Ohm
Lead Channel Impedance Value: 399 Ohm
Lead Channel Impedance Value: 513 Ohm
Lead Channel Impedance Value: 570 Ohm
Lead Channel Impedance Value: 589 Ohm
Lead Channel Impedance Value: 760 Ohm
Lead Channel Impedance Value: 760 Ohm
Lead Channel Impedance Value: 760 Ohm
Lead Channel Pacing Threshold Amplitude: 0.75 V
Lead Channel Pacing Threshold Amplitude: 1.375 V
Lead Channel Pacing Threshold Amplitude: 3 V
Lead Channel Pacing Threshold Pulse Width: 0.4 ms
Lead Channel Pacing Threshold Pulse Width: 0.4 ms
Lead Channel Pacing Threshold Pulse Width: 1 ms
Lead Channel Sensing Intrinsic Amplitude: 2.625 mV
Lead Channel Sensing Intrinsic Amplitude: 2.625 mV
Lead Channel Sensing Intrinsic Amplitude: 7.125 mV
Lead Channel Sensing Intrinsic Amplitude: 7.125 mV
Lead Channel Setting Pacing Amplitude: 2 V
Lead Channel Setting Pacing Amplitude: 2.75 V
Lead Channel Setting Pacing Amplitude: 3.75 V
Lead Channel Setting Pacing Pulse Width: 0.4 ms
Lead Channel Setting Pacing Pulse Width: 1 ms
Lead Channel Setting Sensing Sensitivity: 0.3 mV

## 2018-10-06 ENCOUNTER — Encounter: Payer: Self-pay | Admitting: Cardiology

## 2018-10-06 NOTE — Progress Notes (Signed)
Remote ICD transmission.   

## 2018-10-11 ENCOUNTER — Ambulatory Visit (INDEPENDENT_AMBULATORY_CARE_PROVIDER_SITE_OTHER): Payer: Medicare Other

## 2018-10-11 DIAGNOSIS — I5022 Chronic systolic (congestive) heart failure: Secondary | ICD-10-CM

## 2018-10-11 DIAGNOSIS — Z95810 Presence of automatic (implantable) cardiac defibrillator: Secondary | ICD-10-CM

## 2018-10-12 ENCOUNTER — Telehealth: Payer: Self-pay | Admitting: *Deleted

## 2018-10-12 MED ORDER — PRAVASTATIN SODIUM 40 MG PO TABS: 80.0000 mg | ORAL_TABLET | Freq: Every evening | ORAL | Status: DC

## 2018-10-12 NOTE — Telephone Encounter (Signed)
States she will request new 80mg  tab when she needs it - just had 90 day supply sent in on the 40mg .

## 2018-10-12 NOTE — Telephone Encounter (Signed)
Notes recorded by Laurine Blazer, LPN on 2/54/2706 at 2:37 PM EDT  Patient notified. Copy to pmd. Follow up scheduled for 10/29/2018.   ------   Notes recorded by Herminio Commons, MD on 10/12/2018 at 12:31 PM EDT  LDL not at goal. Consider increasing pravastatin to 80 mg. Has significant CKD

## 2018-10-13 ENCOUNTER — Telehealth: Payer: Self-pay

## 2018-10-13 NOTE — Progress Notes (Signed)
EPIC Encounter for ICM Monitoring  Patient Name: Becky [name] is a 68 y.o. female Date: 10/13/2018 Primary Care Physican: Becky Art, MD Primary Cardiologist:Branch Electrophysiologist: Allred Bi-V Pacing:96.2% 08/02/2018 Weight: 170 lbs 09/08/2018 Weight: 167 lbs   Attempted call to patient and unable to reach.  Left detailed message per DPR regarding transmission. Transmission reviewed.   OptivolThoracic impedancenormal with intervals of possible fluid accumulation in the last month.  Prescribed:Furosemide 40 mg 2 tablets (80 mg total) daily    Labs: 08/17/2018 Creatinine 1.77, BUN 23, Potassium 4.5, Sodium 143, GFR 29-34  Recommendations: Left voice mail with ICM number and encouraged to call if experiencing any fluid symptoms.  Follow-up plan: ICM clinic phone appointment on10/07/2018.OV with Dr Harl Bowie 10/29/2018.  Copy of ICM check sent to Dr.Allred.   3 month ICM trend: 10/11/2018    1 Year ICM trend:       Rosalene Billings, RN 10/13/2018 11:54 AM

## 2018-10-13 NOTE — Telephone Encounter (Signed)
Remote ICM transmission received.  Attempted call to patient regarding ICM remote transmission and left detailed message, per DPR. 

## 2018-10-20 ENCOUNTER — Other Ambulatory Visit: Payer: Self-pay | Admitting: Cardiology

## 2018-10-29 ENCOUNTER — Encounter: Payer: Self-pay | Admitting: Cardiology

## 2018-10-29 ENCOUNTER — Telehealth (INDEPENDENT_AMBULATORY_CARE_PROVIDER_SITE_OTHER): Payer: Medicare Other | Admitting: Cardiology

## 2018-10-29 VITALS — BP 157/77 | HR 68 | Ht 60.0 in | Wt 175.0 lb

## 2018-10-29 DIAGNOSIS — E782 Mixed hyperlipidemia: Secondary | ICD-10-CM

## 2018-10-29 DIAGNOSIS — N183 Chronic kidney disease, stage 3 unspecified: Secondary | ICD-10-CM

## 2018-10-29 DIAGNOSIS — I5022 Chronic systolic (congestive) heart failure: Secondary | ICD-10-CM | POA: Diagnosis not present

## 2018-10-29 DIAGNOSIS — I10 Essential (primary) hypertension: Secondary | ICD-10-CM

## 2018-10-29 DIAGNOSIS — I255 Ischemic cardiomyopathy: Secondary | ICD-10-CM

## 2018-10-29 MED ORDER — PRAVASTATIN SODIUM 80 MG PO TABS: 80.0000 mg | ORAL_TABLET | Freq: Every evening | ORAL | 1 refills | Status: DC

## 2018-10-29 MED ORDER — FUROSEMIDE 40 MG PO TABS: ORAL_TABLET | ORAL | 1 refills | Status: DC

## 2018-10-29 NOTE — Patient Instructions (Signed)
Your physician wants you to follow-up in: [place] will receive a reminder letter in the mail two months in advance. If you don't receive a letter, please call our office to schedule the follow-up appointment.  Your physician has recommended you make the following change in your medication:   CHANGE LASIX 40 MG DAILY - MAY TAKE ADDITIONAL 20 MG AS NEEDED FOR SWELLING OR WEIGHT GAIN   WE HAVE REFILLED PRAVASTATIN 80 MG DAILY   Your physician recommends that you return for lab work in: 3 WEEKS BMP  Thank you for choosing [facility]!!

## 2018-10-29 NOTE — Addendum Note (Signed)
Addended by: Julian Hy T on: 10/29/2018 10:11 AM   Modules accepted: Orders

## 2018-10-29 NOTE — Progress Notes (Signed)
Virtual Visit via Telephone Note   This visit type was conducted due to national recommendations for restrictions regarding the COVID-19 Pandemic (e.g. social distancing) in an effort to limit this patient's exposure and mitigate transmission in our community.  Due to her co-morbid illnesses, this patient is at least at moderate risk for complications without adequate follow up.  This format is felt to be most appropriate for this patient at this time.  The patient did not have access to video technology/had technical difficulties with video requiring transitioning to audio format only (telephone).  All issues noted in this document were discussed and addressed.  No physical exam could be performed with this format.  Please refer to the patient's chart for her  consent to telehealth for [facility].   Date:  10/29/2018   ID:  Becky [name], DOB 1950/08/08, MRN [id number]  Patient Location: Home Provider Location: Office  PCP:  Joseph Art, MD  Cardiologist:  Carlyle Dolly, MD  Electrophysiologist:  Thompson Grayer, MD   Evaluation Performed:  Follow-Up Visit  Chief Complaint:  Follow up  History of Present Illness:    Becky [name] is a 68 y.o. female seen today for follow up of the following medical problems.  1. ICM/Chronic systolic heart failure - history of multiple PCIs as described below, mainly in [place].  - 04/2013 LVEF 30-35%. Repeat echo after BiV AICD 05/2014 shows LVEF 18%, grade I diastolic dysfunction.  - 09/2015 echo LVEF 55-60%, no WMAs, grade I diastolic dysfunction - 29/9371 echo [place]: LVEF 65-70%, moderate LVH.  - medical therapy has been limited by orthostatic symtpoms. Not on ACE or ARB -she has been on long term plavix due to her extensive stent burden    - denies any chest pain. No SOB/DOE. No LE edema - compliant with meds    2. OSA  - compliant with cpap, followed by Dr Marjory Lies in [place], [place].   3.  Hyperlipidemia - muscle aches on multiple statins, has tolerated pravastatin - she has recently increased pravastatin 23m. Last LDL 120s  4. HTN - compliant with meds  5. COPD - [place] followed by Dr BLuciana Axe    The patient does not have symptoms concerning for COVID-19 infection (fever, chills, cough, or new shortness of breath).    Past Medical History:  Diagnosis Date  . AICD (automatic cardioverter/defibrillator) present   . Anxiety   . Arthritis    "all my joints; neck, back, legs, arms, elbows"  (10/03/2015)  . Asthma   . CAD (coronary artery disease)    a. 5/08: s/p DES to PDA and DES to LAD; b. 1/09: s/p DES to pLAD; c. 1/11: s/p DES to LAD x 2, d. LHC [place]- EF 55%, mild ant HK, nl LM, LAD stents ok, oLAD 30, RCA stents ok;  e.  4/13:  s/p DES to RCA, f. 1/14: dLM 20-30, mOM3 20-30, mLAD 80 ISR => s/p 2.5x16 mm Promus Element DES; EF 45-50%   . CHF (congestive heart failure) ([place]   . Chronic bronchitis ([place]   . Chronic lower back pain    "into pelvis region" (10/03/2015)  . COPD (chronic obstructive pulmonary disease) ([place]   . Daily headache    "might be from sinus" (10/03/2015)  . Depression    hx (10/03/2015)  . Fibromyalgia   . GERD (gastroesophageal reflux disease)   . Heart murmur   . HLD (hyperlipidemia)   . Hypertension   .  Ischemic cardiomyopathy    a. Echo (11/14):  EF 25%   . LBBB (left bundle Mary-Ann Pennella block)   . Myocardial infarction ([place]) 12/2012  . OSA on CPAP   . Pneumonia 1950s  . Syncope 11/14   LifeVest placed  . Upper back pain, chronic    "into my shoulders; fibromyalgia works here" (10/03/2015)   Past Surgical History:  Procedure Laterality Date  . BI-VENTRICULAR IMPLANTABLE CARDIOVERTER DEFIBRILLATOR N/A 06/02/2013   Procedure: BI-VENTRICULAR IMPLANTABLE CARDIOVERTER DEFIBRILLATOR  (CRT-D);  Surgeon: Coralyn Mark, MD;  Location: [facility] CATH LAB;  Service: Cardiovascular;  Laterality: N/A;   . BI-VENTRICULAR IMPLANTABLE CARDIOVERTER DEFIBRILLATOR  (CRT-D)  06/02/2013   MDT VivaQuad CRTD implanted by Dr Rayann Heman for ICM, CHF  . CARDIAC CATHETERIZATION  12/2012   S/P MI  . CORONARY ANGIOPLASTY WITH STENT PLACEMENT  2007; 2009; 2011   "?2 +1 +1 "  . CORONARY ANGIOPLASTY WITH STENT PLACEMENT  02/2012  . FRACTURE SURGERY    . HYSTEROSCOPY W/ ENDOMETRIAL ABLATION  ~ 1998  . LEAD REVISION  11-08-13   RA and LV lead revision by Dr Rayann Heman  . LEAD REVISION Left 11/08/2013   Procedure: LEAD REVISION;  Surgeon: Coralyn Mark, MD;  Location: [facility] CATH LAB;  Service: Cardiovascular;  Laterality: Left;  . LEFT HEART CATHETERIZATION WITH CORONARY ANGIOGRAM N/A 01/03/2013   Procedure: LEFT HEART CATHETERIZATION WITH CORONARY ANGIOGRAM;  Surgeon: Burnell Blanks, MD;  Location: [facility] CATH LAB;  Service: Cardiovascular;  Laterality: N/A;  . ORIF ANKLE FRACTURE Right 10/21/2009  . TUBAL LIGATION  1989     No outpatient medications have been marked as taking for the 10/29/18 encounter (Appointment) with Arnoldo Lenis, MD.     Allergies:   Tape, Codeine, Prozac [fluoxetine hcl], Fosamax [alendronate sodium], Keflex [cephalexin], and Lipitor [atorvastatin]   Social History   Tobacco Use  . Smoking status: Current Every Day Smoker    Packs/day: 0.50    Years: 20.00    Pack years: 10.00    Types: Cigarettes    Start date: 03/09/1991  . Smokeless tobacco: Never Used  . Tobacco comment: 4-5 packs per week  Substance Use Topics  . Alcohol use: Yes    Alcohol/week: 0.0 standard drinks    Comment: 816/2017  "lmight have a few drinks a year"  . Drug use: No     Family Hx: The patient's family history includes Breast cancer in her mother, sister, and sister; Heart failure in her brother; Uterine cancer in her sister.  ROS:   Please see the history of present illness.    All other systems reviewed and are negative.   Prior CV studies:   The following studies were reviewed today:   04/2010 Cath [place], [place]: LVEF 55%, mild anterior hypokinesis. LM normal, LAD with stents in prox and mid portion widely patent, LAD ostial 30%. Diags with luminal irregs. LCX with luminial irregs. RCA with distal patent stents  Stent cards May 2008 DES to PDA, DES to LAD  Jan 2009 DES to prox LAD  Mar 05 2009 DES to LAD x2  05/2011 DES to RCA  Jan 2014 stent done [place], [place]  03/2010 Echo: LVEF 40%, hypokinesis of the anteroseptum.   01/02/13 Cath Hemodynamic Findings: Central aortic pressure: 147/79  Left ventricular pressure: 130/22/26  Angiographic Findings: Left main: 30% distal stenosis. It appears that the distal left main has a stent that continues into the LAD.  Left Anterior Descending Artery: Large vessel that courses to  the apex. The entire proximal and mid LAD is stented. There is mild stent restenosis in the proximal segment. The mid stented segment has diffuse 30% stent restenosis. The distal vessel becomes small in caliber and has mild diffuse plaque. There is a moderate caliber first diagonal Taichi Repka with mild plaque disease.  Circumflex Artery: Moderate caliber non-dominant vessel with three moderate caliber obtuse marginal branches. No obstructive disease.  Right Coronary Artery: Large dominant vessel with 30% proximal stenosis, heavily calcified mid vessel with patent stent in the mid vessel (no significant restenosis). The distal vessel has diffuse 40-50% stenosis. The posterolateral Jacobey Gura and PDA are moderate caliber, patent vessels with mild plaque disease.  Left Ventricular Angiogram: LVEF=25-30%. Hypokinesis of the antero-apical wall.  Impression:  1. 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 Elevated troponin following syncopal event. No culprit lesions seen.  3. Moderate to severe LV systolic dysfunction.  Recommendations: Continue medical management of CAD. Workup for syncope is underway. Carotid dopplers today.  Will check d-dimer as PE is a possibility. She could also have had an arrythmia with LV dysfunction.  Complications: None. The patient tolerated the procedure well.  01/03/13 Echo - LVEF 25-30%, akinesis of the anteroseptal and apical myocardium, grade I diastolic dysfunction,  62/70/35 Carotid US - The vertebral arteries appear patent with antegrade flow. - Findings consistent with1- 39 percent stenosis,high end of scale,involving the right internal carotid artery. - Findings consistent with 40 - 59 percent stenosis, low end of scale,involving the left internal carotid artery. - ICA/CCA ratio. right =1.72. left = 1.52 Other specific details can be found in the table(s) above. Prepared and Electronically Authenticated by 12/13/12 Event monitor No symptoms reported, sinus rhythm with conduction delay, occasional PVCs.  04/2013 Echo Study Conclusions  - Left ventricle: The cavity size was normal. Wall thickness was increased in a pattern of mild LVH with moderate basal septal hypertrophy. Systolic function was moderately to severely reduced. The estimated ejection fraction was in the range of 30% to 35%. There is akinesis to dyskinesis of the mid-distal anteroseptal and apical myocardium. There is akinesis of the distalinferoseptal myocardium. Doppler parameters are consistent with abnormal left ventricular relaxation (grade 1 diastolic dysfunction). - Ventricular septum: Septal motion showed abnormal function and dyssynergy. - Aortic valve: Mildly calcified annulus. Probably trileaflet; mildly calcified leaflets. No significant regurgitation. - Mitral valve: Calcified annulus. Trivial regurgitation. - Left atrium: The atrium was mildly dilated. - Right atrium: Central venous pressure: 32m Hg (est). - Tricuspid valve: Trivial regurgitation. - Pulmonary arteries: PA peak pressure: 228mHg (S). - Pericardium, extracardiac: There was no pericardial effusion. Impressions:   [place] moderate basal septal hypertrophy, LVEF 30-35% with wall motion abnormalities as outlined, grade 1 diastolic dysfunction. Mild left atrial enlagement. MAC with trivial mitral regurgitation. MIldlysclerotic aortic valve. Trivial tricuspid regurgitation with normal PASP 20 mmHg. 05/2014 echo Study Conclusions  - Left ventricle: Technically limited study. The cavity size was normal. Wall thickness was increased in a pattern of moderate LVH. The estimated ejection fraction was 55%. Doppler parameters are consistent with abnormal left ventricular relaxation (grade 1 diastolic dysfunction). - Aortic valve: Sclerosis without stenosis. - Right ventricle: The cavity size was normal. Pacer wire or catheter noted in right ventricle. Systolic function was normal. - Impressions: Since the study in 2015, there is definite improvement in LV function.  Impressions:  - Since the study in 2015, there is definite improvement in LV function.   09/2015 Nuclear Stress  There  was no ST segment deviation noted during stress.  No T wave inversion was noted during stress.  Defect 1: There is a medium defect of mild severity present in the basal inferior and mid inferior location. This defect is likely a result of bowel loop attenuation artifact. No significant ischemia identified.  This is a low risk study. No significant ischemia identified.  Nuclear stress EF: 54%. Mid to apical septal wall hypokinesis noted   09/2015 echo Study Conclusions  - Left ventricle: The cavity size was normal. Systolic function was normal. The estimated ejection fraction was in the range of 55% to 60%. Wall motion was normal; there were no regional wall motion abnormalities. Doppler parameters are consistent with abnormal left ventricular relaxation (grade 1 diastolic dysfunction). Doppler parameters are consistent with indeterminate ventricular filling pressure. -  Aortic valve: Transvalvular velocity was within the normal range. There was no stenosis. There was no regurgitation. - Mitral valve: Transvalvular velocity was within the normal range. There was no evidence for stenosis. There was no regurgitation. - Left atrium: The atrium was moderately dilated. - Right ventricle: The cavity size was normal. Wall thickness was normal. Systolic function was normal. - Tricuspid valve: There was no regurgitation.  Labs/Other Tests and Data Reviewed:    EKG:  No ECG reviewed.  Recent Labs: No results found for requested labs within last 8760 hours.   Recent Lipid Panel Lab Results  Component Value Date/Time   CHOL 152 01/03/2013 12:40 AM   TRIG 139 01/03/2013 12:40 AM   HDL 46 01/03/2013 12:40 AM   CHOLHDL 3.3 01/03/2013 12:40 AM   LDLCALC 78 01/03/2013 12:40 AM    Wt Readings from Last 3 Encounters:  06/18/18 170 lb (77.1 kg)  03/26/18 175 lb 12.8 oz (79.7 kg)  08/31/17 170 lb 9.6 oz (77.4 kg)     Objective:    Vital Signs:   Today's Vitals   10/29/18 0923  BP: (!) 157/77  Pulse: 68  Weight: 175 lb (79.4 kg)  Height: 5' (1.524 m)   Body mass index is 34.18 kg/m. Normal affect. Normal speech pattern and tone. Comfortable, no apparent distress.   ASSESSMENT & PLAN:    1. ICM/Chronic systolic heart failure  - previous LVEF 30-35%. After BiV AICD repeat echo with normalized LVEF - medical therapy has been limited due to orthstatic symptoms. ACE-I previously stopped during 09/2015 admission due to hypotension. 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     3. Hyperlipidemia - difficulty tolerating statins in the past, has tolerated pravastatin -recent incrase in pravastatin to 8064ms her LDL was not at goal, continue current therapyt   4. HTN -elevated but heavy bc poweder use, follow with weaning.      COVID-19 Education: The signs and symptoms of COVID-19 were discussed with the patient and how to seek care for testing (follow up with PCP or arrange E-visit).  The importance of social distancing was discussed today.  Time:   Today, I have spent 21 minutes with  the patient with telehealth technology discussing the above problems.     Medication Adjustments/Labs and Tests Ordered: Current medicines are reviewed at length with the patient today.  Concerns regarding medicines are outlined above.   Tests Ordered: No orders of the defined types were placed in this encounter.   Medication Changes: No orders of the defined types were placed in this encounter.   Follow Up:  Virtual Visit in 4 month(s)  Signed, Carlyle Dolly, MD  10/29/2018 9:23 AM    [place]

## 2018-11-23 ENCOUNTER — Ambulatory Visit (INDEPENDENT_AMBULATORY_CARE_PROVIDER_SITE_OTHER): Payer: Medicare Other

## 2018-11-23 DIAGNOSIS — I5022 Chronic systolic (congestive) heart failure: Secondary | ICD-10-CM

## 2018-11-23 DIAGNOSIS — Z95810 Presence of automatic (implantable) cardiac defibrillator: Secondary | ICD-10-CM

## 2018-11-26 NOTE — Progress Notes (Signed)
EPIC Encounter for ICM Monitoring  Patient Name: Becky [name] is a 68 y.o. female Date: 11/26/2018 Primary Care Physican: Joseph Art, MD Primary Cardiologist:Branch Electrophysiologist: Allred Bi-V Pacing:96.7% 11/26/2018 Weight: 170 lbs  Battery Longevity: 4 months   Spoke with patient and reports feeling well at this time.  Denies fluid symptoms.     OptivolThoracic impedancenormal with intervals of possible fluid accumulation in the last month.  Prescribed:Furosemide 40 mg take 1 tablet (40 mg total) daily.  Take additional 0.5 tablet as needed for swelling.  Labs: 10/11/2018 Creatinine 1.77, BUN 23, Potassium 4.5, Sodium 143, GFR 29-34  Recommendations: No changes and encouraged to call if experiencing any fluid symptoms.  Follow-up plan: ICM clinic phone appointment on 12/28/2018.   91 day device clinic remote transmission 12/27/2018.    Copy of ICM check sent to Dr. Rayann Heman.   3 month ICM trend: 11/23/2018    1 Year ICM trend:       Rosalene Billings, RN 11/26/2018 1:47 PM

## 2018-12-03 ENCOUNTER — Telehealth: Payer: Self-pay | Admitting: *Deleted

## 2018-12-03 NOTE — Telephone Encounter (Signed)
Left detailed message on pt VM per DPR - routed to pcp

## 2018-12-03 NOTE — Telephone Encounter (Signed)
-----   Message from Herminio Commons, MD sent at 11/30/2018  3:41 PM EDT ----- Creatinine has improved from prior results.

## 2018-12-22 ENCOUNTER — Other Ambulatory Visit: Payer: Self-pay | Admitting: *Deleted

## 2018-12-22 MED ORDER — PANTOPRAZOLE SODIUM 40 MG PO TBEC: 40.0000 mg | DELAYED_RELEASE_TABLET | Freq: Every day | ORAL | 1 refills | Status: DC

## 2018-12-23 ENCOUNTER — Other Ambulatory Visit: Payer: Self-pay | Admitting: *Deleted

## 2018-12-23 MED ORDER — SPIRONOLACTONE 25 MG PO TABS: 12.5000 mg | ORAL_TABLET | Freq: Every day | ORAL | 3 refills | Status: DC

## 2018-12-27 ENCOUNTER — Ambulatory Visit (INDEPENDENT_AMBULATORY_CARE_PROVIDER_SITE_OTHER): Payer: Medicare Other | Admitting: *Deleted

## 2018-12-27 DIAGNOSIS — I255 Ischemic cardiomyopathy: Secondary | ICD-10-CM

## 2018-12-27 DIAGNOSIS — I5022 Chronic systolic (congestive) heart failure: Secondary | ICD-10-CM

## 2018-12-28 ENCOUNTER — Ambulatory Visit (INDEPENDENT_AMBULATORY_CARE_PROVIDER_SITE_OTHER): Payer: Medicare Other

## 2018-12-28 DIAGNOSIS — I5022 Chronic systolic (congestive) heart failure: Secondary | ICD-10-CM | POA: Diagnosis not present

## 2018-12-28 DIAGNOSIS — Z95810 Presence of automatic (implantable) cardiac defibrillator: Secondary | ICD-10-CM | POA: Diagnosis not present

## 2018-12-28 LAB — CUP PACEART REMOTE DEVICE CHECK
Battery Remaining Longevity: 4 mo
Battery Voltage: 2.8 V
Brady Statistic AP VP Percent: 13.1 %
Brady Statistic AP VS Percent: 0.2 %
Brady Statistic AS VP Percent: 83.89 %
Brady Statistic AS VS Percent: 2.8 %
Brady Statistic RA Percent Paced: 13.1 %
Brady Statistic RV Percent Paced: 1.89 %
Date Time Interrogation Session: 20201109083323
HighPow Impedance: 93 Ohm
Implantable Lead Implant Date: 20150416
Implantable Lead Implant Date: 20150922
Implantable Lead Implant Date: 20150922
Implantable Lead Location: 753858
Implantable Lead Location: 753859
Implantable Lead Location: 753860
Implantable Lead Model: 4598
Implantable Lead Model: 5076
Implantable Lead Model: 6935
Implantable Pulse Generator Implant Date: 20150416
Lead Channel Impedance Value: 266 Ohm
Lead Channel Impedance Value: 323 Ohm
Lead Channel Impedance Value: 323 Ohm
Lead Channel Impedance Value: 342 Ohm
Lead Channel Impedance Value: 380 Ohm
Lead Channel Impedance Value: 399 Ohm
Lead Channel Impedance Value: 437 Ohm
Lead Channel Impedance Value: 494 Ohm
Lead Channel Impedance Value: 570 Ohm
Lead Channel Impedance Value: 589 Ohm
Lead Channel Impedance Value: 722 Ohm
Lead Channel Impedance Value: 722 Ohm
Lead Channel Impedance Value: 760 Ohm
Lead Channel Pacing Threshold Amplitude: 0.875 V
Lead Channel Pacing Threshold Amplitude: 1.375 V
Lead Channel Pacing Threshold Amplitude: 2.5 V
Lead Channel Pacing Threshold Pulse Width: 0.4 ms
Lead Channel Pacing Threshold Pulse Width: 0.4 ms
Lead Channel Pacing Threshold Pulse Width: 1 ms
Lead Channel Sensing Intrinsic Amplitude: 2.75 mV
Lead Channel Sensing Intrinsic Amplitude: 2.75 mV
Lead Channel Sensing Intrinsic Amplitude: 6.5 mV
Lead Channel Sensing Intrinsic Amplitude: 6.5 mV
Lead Channel Setting Pacing Amplitude: 2 V
Lead Channel Setting Pacing Amplitude: 2.75 V
Lead Channel Setting Pacing Amplitude: 3.75 V
Lead Channel Setting Pacing Pulse Width: 0.4 ms
Lead Channel Setting Pacing Pulse Width: 1 ms
Lead Channel Setting Sensing Sensitivity: 0.3 mV

## 2018-12-31 ENCOUNTER — Telehealth: Payer: Self-pay

## 2018-12-31 NOTE — Telephone Encounter (Signed)
Remote ICM transmission received.  Attempted call to patient regarding ICM remote transmission and left detailed message per DPR.  Advised to return call for any fluid symptoms or questions. Next ICM remote transmission scheduled 01/31/2019.     

## 2018-12-31 NOTE — Progress Notes (Signed)
EPIC Encounter for ICM Monitoring  Patient Name: Becky [name] is a 68 y.o. female Date: 12/31/2018 Primary Care Physican: Joseph Art, MD Primary Cardiologist:Branch Electrophysiologist: Allred Bi-V Pacing:94.7% 11/26/2018 Weight: 170 lbs  Battery Longevity: 3 months   Attempted call to patient and unable to reach.  Left detailed message per DPR regarding transmission. Transmission reviewed.   OptivolThoracic impedancenormalwith intervals of possible fluid accumulation in the last month.  Prescribed:Furosemide 40 mgtake 1 tablet (40 mg total) daily.  Take additional 0.5 tablet as needed for swelling.  Labs: 10/11/2018 Creatinine 1.77, BUN 23, Potassium 4.5, Sodium 143, GFR 29-34  Recommendations: Left voice mail with ICM number and encouraged to call if experiencing any fluid symptoms.  Follow-up plan: ICM clinic phone appointment on 01/31/2019.   91 day device clinic remote transmission 03/28/2019.    Copy of ICM check sent to Dr. Rayann Heman.   3 month ICM trend: 12/27/2018    1 Year ICM trend:       Rosalene Billings, RN 12/31/2018 1:11 PM

## 2019-01-25 ENCOUNTER — Other Ambulatory Visit: Payer: Self-pay | Admitting: *Deleted

## 2019-01-25 MED ORDER — PANTOPRAZOLE SODIUM 40 MG PO TBEC: 40.0000 mg | DELAYED_RELEASE_TABLET | Freq: Every day | ORAL | 2 refills | Status: DC

## 2019-01-25 NOTE — Addendum Note (Signed)
Addended by: Douglass Rivers D on: 01/25/2019 02:06 PM   Modules accepted: Level of Service

## 2019-01-25 NOTE — Progress Notes (Signed)
Remote ICD transmission.   

## 2019-01-31 ENCOUNTER — Ambulatory Visit (INDEPENDENT_AMBULATORY_CARE_PROVIDER_SITE_OTHER): Payer: Medicare Other

## 2019-01-31 ENCOUNTER — Ambulatory Visit (INDEPENDENT_AMBULATORY_CARE_PROVIDER_SITE_OTHER): Payer: Medicare Other | Admitting: *Deleted

## 2019-01-31 DIAGNOSIS — I5022 Chronic systolic (congestive) heart failure: Secondary | ICD-10-CM

## 2019-01-31 DIAGNOSIS — Z95810 Presence of automatic (implantable) cardiac defibrillator: Secondary | ICD-10-CM

## 2019-01-31 DIAGNOSIS — I5042 Chronic combined systolic (congestive) and diastolic (congestive) heart failure: Secondary | ICD-10-CM

## 2019-01-31 LAB — CUP PACEART REMOTE DEVICE CHECK
Battery Remaining Longevity: 4 mo
Battery Voltage: 2.77 V
Brady Statistic AP VP Percent: 15.89 %
Brady Statistic AP VS Percent: 0.23 %
Brady Statistic AS VP Percent: 81.22 %
Brady Statistic AS VS Percent: 2.66 %
Brady Statistic RA Percent Paced: 15.77 %
Brady Statistic RV Percent Paced: 2.1 %
Date Time Interrogation Session: 20201214031608
HighPow Impedance: 79 Ohm
Implantable Lead Implant Date: 20150416
Implantable Lead Implant Date: 20150922
Implantable Lead Implant Date: 20150922
Implantable Lead Location: 753858
Implantable Lead Location: 753859
Implantable Lead Location: 753860
Implantable Lead Model: 4598
Implantable Lead Model: 5076
Implantable Lead Model: 6935
Implantable Pulse Generator Implant Date: 20150416
Lead Channel Impedance Value: 266 Ohm
Lead Channel Impedance Value: 304 Ohm
Lead Channel Impedance Value: 323 Ohm
Lead Channel Impedance Value: 323 Ohm
Lead Channel Impedance Value: 323 Ohm
Lead Channel Impedance Value: 399 Ohm
Lead Channel Impedance Value: 437 Ohm
Lead Channel Impedance Value: 456 Ohm
Lead Channel Impedance Value: 513 Ohm
Lead Channel Impedance Value: 513 Ohm
Lead Channel Impedance Value: 646 Ohm
Lead Channel Impedance Value: 646 Ohm
Lead Channel Impedance Value: 703 Ohm
Lead Channel Pacing Threshold Amplitude: 1 V
Lead Channel Pacing Threshold Amplitude: 1.25 V
Lead Channel Pacing Threshold Amplitude: 3.25 V
Lead Channel Pacing Threshold Pulse Width: 0.4 ms
Lead Channel Pacing Threshold Pulse Width: 0.4 ms
Lead Channel Pacing Threshold Pulse Width: 1 ms
Lead Channel Sensing Intrinsic Amplitude: 1 mV
Lead Channel Sensing Intrinsic Amplitude: 1 mV
Lead Channel Sensing Intrinsic Amplitude: 6.875 mV
Lead Channel Sensing Intrinsic Amplitude: 6.875 mV
Lead Channel Setting Pacing Amplitude: 2 V
Lead Channel Setting Pacing Amplitude: 2.5 V
Lead Channel Setting Pacing Amplitude: 3.75 V
Lead Channel Setting Pacing Pulse Width: 0.4 ms
Lead Channel Setting Pacing Pulse Width: 1 ms
Lead Channel Setting Sensing Sensitivity: 0.3 mV

## 2019-02-02 ENCOUNTER — Other Ambulatory Visit: Payer: Self-pay

## 2019-02-02 NOTE — Progress Notes (Signed)
EPIC Encounter for ICM Monitoring  Patient Name: Becky [name] is a 68 y.o. female Date: 02/02/2019 Primary Care Physican: Joseph Art, MD Primary Cardiologist:Branch Electrophysiologist: Allred Bi-V Pacing:94.3% Last Weight: 170lbs (does not weigh at home)  Battery Longevity: 4 months   Spoke with patient and she is asymptomatic for fluid accumulation.  Patient would like to have a copy of lab results mailed to her.  She does not remember my chart username and password.  Provided mychart help desk number. Message sent to Dr Percell Locus office inquiring about mailing lab results and waiting on respond back.   OptivolThoracic impedancenormal.  Prescribed:Furosemide 40 mgtake 1tablet (40 mg total) daily. Take additional 0.5 tablet as needed for swelling.  Labs: 10/11/2018 Creatinine 1.77, BUN 23, Potassium 4.5, Sodium 143, GFR 29-34  Recommendations: No changes and encouraged to call if experiencing any fluid symptoms.  Follow-up plan: ICM clinic phone appointment on 03/07/2019.   91 day device clinic remote transmission 03/28/2019.    Copy of ICM check sent to Dr. Rayann Heman.    3 month ICM trend: 01/31/2019    1 Year ICM trend:       Rosalene Billings, RN 02/02/2019 11:11 AM

## 2019-02-02 NOTE — Progress Notes (Signed)
Call back to patient and advised copy of lab results will be mailed.  She said that will be fine.

## 2019-02-21 ENCOUNTER — Other Ambulatory Visit: Payer: Self-pay | Admitting: *Deleted

## 2019-02-21 MED ORDER — METOPROLOL SUCCINATE ER 50 MG PO TB24: ORAL_TABLET | ORAL | 3 refills | Status: DC

## 2019-03-05 NOTE — Progress Notes (Signed)
ICD remote 

## 2019-03-07 ENCOUNTER — Ambulatory Visit (INDEPENDENT_AMBULATORY_CARE_PROVIDER_SITE_OTHER): Payer: Medicare Other

## 2019-03-07 DIAGNOSIS — I5042 Chronic combined systolic (congestive) and diastolic (congestive) heart failure: Secondary | ICD-10-CM | POA: Diagnosis not present

## 2019-03-07 DIAGNOSIS — Z95810 Presence of automatic (implantable) cardiac defibrillator: Secondary | ICD-10-CM | POA: Diagnosis not present

## 2019-03-09 NOTE — Progress Notes (Signed)
EPIC Encounter for ICM Monitoring  Patient Name: Becky [name] is a 69 y.o. female Date: 03/09/2019 Primary Care Physican: Bedelia Person, MD Primary Cardiologist:Branch Electrophysiologist: Allred Bi-V Pacing:95.2% Last Weight: 170lbs (does not weigh at home)  Battery Longevity:71months   Spoke with patient and patient is doing okay.  She had cough and uses inhaler to help with breathing.   Explained alert she will hear when device battery is low and she will have 3 months left on battery once it alarms.  OptivolThoracic impedancenormal.  Prescribed:Furosemide 40 mgtake 1tablet (40 mg total) daily. Take additional 0.5 tablet as needed for swelling.  Labs: 11/30/2018 Creatinine 1.46, BUN 16, Potassium 3.6, Sodium 140, GFR 37-42 10/11/2018 Creatinine 1.77, BUN 23, Potassium 4.5, Sodium 143, GFR 29-34  Recommendations:No changes and encouraged to call if experiencing any fluid symptoms.  Follow-up plan: ICM clinic phone appointment on2/22/2021. 91 day device clinic remote transmission 03/28/2019.   Copy of ICM check sent to Dr.Allred.    3 month ICM trend: 03/07/2019    1 Year ICM trend:       Karie Soda, RN 03/09/2019 5:22 PM

## 2019-03-15 ENCOUNTER — Telehealth (INDEPENDENT_AMBULATORY_CARE_PROVIDER_SITE_OTHER): Payer: Medicare Other | Admitting: Cardiology

## 2019-03-15 ENCOUNTER — Encounter: Payer: Self-pay | Admitting: Cardiology

## 2019-03-15 VITALS — BP 143/76 | HR 69 | Ht 60.0 in | Wt 184.0 lb

## 2019-03-15 DIAGNOSIS — I2510 Atherosclerotic heart disease of native coronary artery without angina pectoris: Secondary | ICD-10-CM

## 2019-03-15 DIAGNOSIS — E782 Mixed hyperlipidemia: Secondary | ICD-10-CM

## 2019-03-15 DIAGNOSIS — I5022 Chronic systolic (congestive) heart failure: Secondary | ICD-10-CM | POA: Diagnosis not present

## 2019-03-15 DIAGNOSIS — I10 Essential (primary) hypertension: Secondary | ICD-10-CM

## 2019-03-15 NOTE — Patient Instructions (Signed)

## 2019-03-15 NOTE — Progress Notes (Signed)
Virtual Visit via Telephone Note   This visit type was conducted due to national recommendations for restrictions regarding the COVID-19 Pandemic (e.g. social distancing) in an effort to limit this patient's exposure and mitigate transmission in our community.  Due to her co-morbid illnesses, this patient is at least at moderate risk for complications without adequate follow up.  This format is felt to be most appropriate for this patient at this time.  The patient did not have access to video technology/had technical difficulties with video requiring transitioning to audio format only (telephone).  All issues noted in this document were discussed and addressed.  No physical exam could be performed with this format.  Please refer to the patient's chart for her  consent to telehealth for [facility].   Date:  03/15/2019   ID:  Becky [name], DOB 04/08/1950, MRN [id number]  Patient Location: Home Provider Location: Office  PCP:  Joseph Art, MD  Cardiologist:  Carlyle Dolly, MD  Electrophysiologist:  Thompson Grayer, MD   Evaluation Performed:  Follow-Up Visit  Chief Complaint:  Follow up  History of Present Illness:    Becky [name] is a 69 y.o. female seen today for follow up of the following medical problems.  1. ICM/Chronic systolic heart failure - history of multiple PCIs as described below, mainly in [place].  - 04/2013 LVEF 30-35%. Repeat echo after BiV AICD 05/2014 shows LVEF 33%, grade I diastolic dysfunction.  - 09/2015 echo LVEF 55-60%, no WMAs, grade I diastolic dysfunction - 38/3291 echo [place]: LVEF 65-70%, moderate LVH.  - medical therapy has been limited by orthostatic symtpoms. Not on ACE or ARB -she has been on long term plavix due to her extensive stent burden    - no recent chest pain. No SOB or DOE - compliant with meds - no recent edema - normal device 12/2018 check   2. OSA  - compliant with cpap, followed by Dr Marjory Lies  in [place], [place]. - just got new cpap machine   3. Hyperlipidemia - muscle aches on multiple statins, has tolerated pravastatin   - labs followed by pcp   4. HTN -compliant with meds - checked bp this AM before taking meds  5. COPD - followed bypulmonary in [place] - followed by Dr Luciana Axe     The patient does not have symptoms concerning for COVID-19 infection (fever, chills, cough, or new shortness of breath).    Past Medical History:  Diagnosis Date  . AICD (automatic cardioverter/defibrillator) present   . Anxiety   . Arthritis    "all my joints; neck, back, legs, arms, elbows"  (10/03/2015)  . Asthma   . CAD (coronary artery disease)    a. 5/08: s/p DES to PDA and DES to LAD; b. 1/09: s/p DES to pLAD; c. 1/11: s/p DES to LAD x 2, d. LHC (3/12 in [place] - EF 55%, mild ant HK, nl LM, LAD stents ok, oLAD 30, RCA stents ok;  e.  4/13:  s/p DES to RCA, f. 1/14: dLM 20-30, mOM3 20-30, mLAD 80 ISR => s/p 2.5x16 mm Promus Element DES; EF 45-50%   . CHF (congestive heart failure) ([place])   . Chronic bronchitis ([place])   . Chronic lower back pain    "into pelvis region" (10/03/2015)  . COPD (chronic obstructive pulmonary disease) ([place])   . Daily headache    "might be from sinus" (10/03/2015)  . Depression    hx (10/03/2015)  . Fibromyalgia   .  GERD (gastroesophageal reflux disease)   . Heart murmur   . HLD (hyperlipidemia)   . Hypertension   . Ischemic cardiomyopathy    a. Echo (11/14):  EF 25%   . LBBB (left bundle Lyndia Bury block)   . Myocardial infarction ([place]) 12/2012  . OSA on CPAP   . Pneumonia 1950s  . Syncope 11/14   LifeVest placed  . Upper back pain, chronic    "into my shoulders; fibromyalgia works here" (10/03/2015)   Past Surgical History:  Procedure Laterality Date  . BI-VENTRICULAR IMPLANTABLE CARDIOVERTER DEFIBRILLATOR N/A 06/02/2013   Procedure: BI-VENTRICULAR IMPLANTABLE CARDIOVERTER DEFIBRILLATOR  (CRT-D);  Surgeon: Coralyn Mark,  MD;  Location: [facility] CATH LAB;  Service: Cardiovascular;  Laterality: N/A;  . BI-VENTRICULAR IMPLANTABLE CARDIOVERTER DEFIBRILLATOR  (CRT-D)  06/02/2013   MDT VivaQuad CRTD implanted by Dr Rayann Heman for ICM, CHF  . CARDIAC CATHETERIZATION  12/2012   S/P MI  . CORONARY ANGIOPLASTY WITH STENT PLACEMENT  2007; 2009; 2011   "?2 +1 +1 "  . CORONARY ANGIOPLASTY WITH STENT PLACEMENT  02/2012  . FRACTURE SURGERY    . HYSTEROSCOPY W/ ENDOMETRIAL ABLATION  ~ 1998  . LEAD REVISION  11-08-13   RA and LV lead revision by Dr Rayann Heman  . LEAD REVISION Left 11/08/2013   Procedure: LEAD REVISION;  Surgeon: Coralyn Mark, MD;  [facility] C[facility] LAB;  Service: Cardiovascular;  Laterality: Left;  . LEFT HEART CATHETERIZATION WITH CORONARY ANGIOGRAM N/A 01/03/2013   Procedure: LEFT HEART CATHETERIZATION WITH CORONARY ANGIOGRAM;  Surgeon: Burnell Blanks, MD;  Location: [facility] CATH LAB;  Service: Cardiovascular;  Laterality: N/A;  . ORIF ANKLE FRACTURE Right 10/21/2009  . TUBAL LIGATION  1989     Current Meds  Medication Sig  . acetaminophen-codeine (TYLENOL #3) 300-30 MG tablet Take 1 tablet by mouth every 6 (six) hours as needed for moderate pain.   Marland Kitchen aspirin EC 81 MG tablet Take 1 tablet (81 mg total) by mouth daily.  . Aspirin-Salicylamide-Caffeine (BC HEADACHE PO) Take 1 each by mouth as needed.   . cetirizine (ZYRTEC) 10 MG tablet Take 1 tablet by mouth daily.  . Cholecalciferol (VITAMIN D) 2000 UNITS CAPS Take 2,000 Units by mouth daily.  . clopidogrel (PLAVIX) 75 MG tablet Take 75 mg by mouth daily.   Marland Kitchen escitalopram (LEXAPRO) 20 MG tablet Take 20 mg by mouth daily.   . fluticasone (FLONASE) 50 MCG/ACT nasal spray Place 2 sprays into both nostrils daily.  . formoterol (PERFOROMIST) 20 MCG/2ML nebulizer solution Take 20 mcg by nebulization 2 (two) times daily.  . furosemide (LASIX) 40 MG tablet TAKE 1 TABLET DAILY MAY TAKE ADDITIONAL 1/2 TABLET AS NEEDED FOR SWELLING  . gabapentin (NEURONTIN) 300 MG capsule  Take 1 capsule by mouth 3 (three) times daily.  Marland Kitchen ipratropium-albuterol (DUONEB) 0.5-2.5 (3) MG/3ML SOLN Take 3 mLs by nebulization every 6 (six) hours as needed (shortness of breath).  . lactulose (CHRONULAC) 10 GM/15ML solution Take 10 g by mouth 2 (two) times daily as needed for mild constipation.  . magnesium oxide (MAG-OX) 400 MG tablet Take 400 mg by mouth daily.  . metFORMIN (GLUCOPHAGE) 500 MG tablet Take 1,000 mg by mouth 2 (two) times daily with a meal.   . metoprolol succinate (TOPROL-XL) 50 MG 24 hr tablet TAKE 1 TABLET BY MOUTH DAILY WITH OR IMMEDIATELY FOLLOWING A MEAL(DOSE DECREASE)  . montelukast (SINGULAIR) 10 MG tablet Take 10 mg by mouth every morning.   . nitroGLYCERIN (NITROSTAT) 0.4 MG SL tablet  Place 1 tablet (0.4 mg total) under the tongue every 5 (five) minutes x 3 doses as needed. If no relief after 3rd dose, proceed to ED  . pantoprazole (PROTONIX) 40 MG tablet Take 1 tablet (40 mg total) by mouth daily.  . pravastatin (PRAVACHOL) 80 MG tablet Take 1 tablet (80 mg total) by mouth every evening.  Marland Kitchen rOPINIRole (REQUIP) 0.5 MG tablet Take 0.5 mg by mouth at bedtime. 2 tablets at bedtime  . spironolactone (ALDACTONE) 25 MG tablet Take 0.5 tablets (12.5 mg total) by mouth daily.  . [DISCONTINUED] calcium carbonate (CALCIUM 600) 1500 (600 Ca) MG TABS tablet Take 600 mg of elemental calcium by mouth daily with breakfast.     Allergies:   Tape, Codeine, Prozac [fluoxetine hcl], Fosamax [alendronate sodium], Keflex [cephalexin], and Lipitor [atorvastatin]   Social History   Tobacco Use  . Smoking status: Current Every Day Smoker    Packs/day: 0.50    Years: 20.00    Pack years: 10.00    Types: Cigarettes    Start date: 03/09/1991  . Smokeless tobacco: Never Used  . Tobacco comment: 4-5 packs per week  Substance Use Topics  . Alcohol use: Yes    Alcohol/week: 0.0 standard drinks    Comment: 816/2017  "lmight have a few drinks a year"  . Drug use: No     Family  Hx: The patient's family history includes Breast cancer in her mother, sister, and sister; Heart failure in her brother; Uterine cancer in her sister.  ROS:   Please see the history of present illness.     All other systems reviewed and are negative.   Prior CV studies:   The following studies were reviewed today:  04/2010 Cath [place], [place]: LVEF 55%, mild anterior hypokinesis. LM normal, LAD with stents in prox and mid portion widely patent, LAD ostial 30%. Diags with luminal irregs. LCX with luminial irregs. RCA with distal patent stents  Stent cards May 2008 DES to PDA, DES to LAD  Jan 2009 DES to prox LAD  Mar 05 2009 DES to LAD x2  05/2011 DES to RCA  Jan 2014 stent done [place], [place]  03/2010 Echo: LVEF 40%, hypokinesis of the anteroseptum.   01/02/13 Cath Hemodynamic Findings: Central aortic pressure: 147/79  Left ventricular pressure: 130/22/26  Angiographic Findings: Left main: 30% distal stenosis. It appears that the distal left main has a stent that continues into the LAD.  Left Anterior Descending Artery: Large vessel that courses to the apex. The entire proximal and mid LAD is stented. There is mild stent restenosis in the proximal segment. The mid stented segment has diffuse 30% stent restenosis. The distal vessel becomes small in caliber and has mild diffuse plaque. There is a moderate caliber first diagonal Reinhardt Licausi with mild plaque disease.  Circumflex Artery: Moderate caliber non-dominant vessel with three moderate caliber obtuse marginal branches. No obstructive disease.  Right Coronary Artery: Large dominant vessel with 30% proximal stenosis, heavily calcified mid vessel with patent stent in the mid vessel (no significant restenosis). The distal vessel has diffuse 40-50% stenosis. The posterolateral Jye Fariss and PDA are moderate caliber, patent vessels with mild plaque disease.  Left Ventricular Angiogram: LVEF=25-30%. Hypokinesis of the  antero-apical wall.  Impression:  1. 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 Elevated troponin following syncopal event. No culprit lesions seen.  3. Moderate to severe LV systolic dysfunction.  Recommendations: Continue medical management of CAD. Workup for syncope is underway. Carotid dopplers  today. Will check d-dimer as PE is a possibility. She could also have had an arrythmia with LV dysfunction.  Complications: None. The patient tolerated the procedure well.  01/03/13 Echo - LVEF 25-30%, akinesis of the anteroseptal and apical myocardium, grade I diastolic dysfunction,  00/34/91 Carotid US - The vertebral arteries appear patent with antegrade flow. - Findings consistent with1- 39 percent stenosis,high end of scale,involving the right internal carotid artery. - Findings consistent with 40 - 59 percent stenosis, low end of scale,involving the left internal carotid artery. - ICA/CCA ratio. right =1.72. left = 1.52 Other specific details can be found in the table(s) above. Prepared and Electronically Authenticated by 12/13/12 Event monitor No symptoms reported, sinus rhythm with conduction delay, occasional PVCs.  04/2013 Echo Study Conclusions  - Left ventricle: The cavity size was normal. Wall thickness was increased in a pattern of mild LVH with moderate basal septal hypertrophy. Systolic function was moderately to severely reduced. The estimated ejection fraction was in the range of 30% to 35%. There is akinesis to dyskinesis of the mid-distal anteroseptal and apical myocardium. There is akinesis of the distalinferoseptal myocardium. Doppler parameters are consistent with abnormal left ventricular relaxation (grade 1 diastolic dysfunction). - Ventricular septum: Septal motion showed abnormal function and dyssynergy. - Aortic valve: Mildly calcified annulus. Probably trileaflet; mildly calcified leaflets. No significant regurgitation. -  Mitral valve: Calcified annulus. Trivial regurgitation. - Left atrium: The atrium was mildly dilated. - Right atrium: Central venous pressure: 66m Hg (est). - Tricuspid valve: Trivial regurgitation. - Pulmonary arteries: PA peak pressure: 215mHg (S). - Pericardium, extracardiac: There was no pericardial effusion. Impressions:  - [place] moderate basal septal hypertrophy, LVEF 30-35% with wall motion abnormalities as outlined, grade 1 diastolic dysfunction. Mild left atrial enlagement. MAC with trivial mitral regurgitation. MIldlysclerotic aortic valve. Trivial tricuspid regurgitation with normal PASP 20 mmHg. 05/2014 echo Study Conclusions  - Left ventricle: Technically limited study. The cavity size was normal. Wall thickness was increased in a pattern of moderate LVH. The estimated ejection fraction was 55%. Doppler parameters are consistent with abnormal left ventricular relaxation (grade 1 diastolic dysfunction). - Aortic valve: Sclerosis without stenosis. - Right ventricle: The cavity size was normal. Pacer wire or catheter noted in right ventricle. Systolic function was normal. - Impressions: Since the study in 2015, there is definite improvement in LV function.  Impressions:  - Since the study in 2015, there is definite improvement in LV function.   09/2015 Nuclear Stress  There was no ST segment deviation noted during stress.  No T wave inversion was noted during stress.  Defect 1: There is a medium defect of mild severity present in the basal inferior and mid inferior location. This defect is likely a result of bowel loop attenuation artifact. No significant ischemia identified.  This is a low risk study. No significant ischemia identified.  Nuclear stress EF: 54%. Mid to apical septal wall hypokinesis noted   09/2015 echo Study Conclusions  - Left ventricle: The cavity size was normal. Systolic function was normal. The estimated  ejection fraction was in the range of 55% to 60%. Wall motion was normal; there were no regional wall motion abnormalities. Doppler parameters are consistent with abnormal left ventricular relaxation (grade 1 diastolic dysfunction). Doppler parameters are consistent with indeterminate ventricular filling pressure. - Aortic valve: Transvalvular velocity was within the normal range. There was no stenosis. There was no regurgitation. - Mitral valve: Transvalvular velocity was within the normal range. There was no evidence for  stenosis. There was no regurgitation. - Left atrium: The atrium was moderately dilated. - Right ventricle: The cavity size was normal. Wall thickness was normal. Systolic function was normal. - Tricuspid valve: There was no regurgitation.  Labs/Other Tests and Data Reviewed:    EKG:  No ECG reviewed.  Recent Labs: No results found for requested labs within last 8760 hours.   Recent Lipid Panel Lab Results  Component Value Date/Time   CHOL 152 01/03/2013 12:40 AM   TRIG 139 01/03/2013 12:40 AM   HDL 46 01/03/2013 12:40 AM   CHOLHDL 3.3 01/03/2013 12:40 AM   LDLCALC 78 01/03/2013 12:40 AM    Wt Readings from Last 3 Encounters:  03/15/19 184 lb (83.5 kg)  10/29/18 175 lb (79.4 kg)  06/18/18 170 lb (77.1 kg)     Objective:    Vital Signs:  BP (!) 143/76   Pulse 69   Ht 5' (1.524 m)   Wt 184 lb (83.5 kg)   BMI 35.94 kg/m    Normal affect. Normal speech pattern and tone. Comfortable, no apparent distress. No audible signs of SOB or wheezing.   ASSESSMENT & PLAN:    1. ICM/Chronic systolic heart failure  - previous LVEF 30-35%. After BiV AICD repeat echo with normalized LVEF - medical therapy has been limited due to orthstatic symptoms. ACE-I previously stopped during 09/2015 admission due to hypotension. - indefinite plavix due to large stent burden  - no recent symptoms, continue current meds   2.HTN - elevated but checked  this AM prior to meds, she will call us back with afternoon bp   3. Hyperlipidemia - difficulty tolerating statins in the past, has tolerated pravastatin - continue statin, f/u pcp labs     COVID-19 Education: The signs and symptoms of COVID-19 were discussed with the patient and how to seek care for testing (follow up with PCP or arrange E-visit).  The importance of social distancing was discussed today.  Time:   Today, I have spent 14 minutes with the patient with telehealth technology discussing the above problems.     Medication Adjustments/Labs and Tests Ordered: Current medicines are reviewed at length with the patient today.  Concerns regarding medicines are outlined above.   Tests Ordered: No orders of the defined types were placed in this encounter.   Medication Changes: No orders of the defined types were placed in this encounter.   Follow Up:  Either In Person or Virtual in 6 month(s)  Signed, Carlyle Dolly, MD  03/15/2019 8:15 AM    [employer]

## 2019-03-28 ENCOUNTER — Ambulatory Visit (INDEPENDENT_AMBULATORY_CARE_PROVIDER_SITE_OTHER): Payer: Medicare Other | Admitting: *Deleted

## 2019-03-28 DIAGNOSIS — I5042 Chronic combined systolic (congestive) and diastolic (congestive) heart failure: Secondary | ICD-10-CM | POA: Diagnosis not present

## 2019-03-28 LAB — CUP PACEART REMOTE DEVICE CHECK
Battery Remaining Longevity: 2 mo
Battery Voltage: 2.73 V
Brady Statistic AP VP Percent: 23.35 %
Brady Statistic AP VS Percent: 0.31 %
Brady Statistic AS VP Percent: 73.07 %
Brady Statistic AS VS Percent: 3.26 %
Brady Statistic RA Percent Paced: 23.1 %
Brady Statistic RV Percent Paced: 2.12 %
Date Time Interrogation Session: 20210208042406
HighPow Impedance: 76 Ohm
Implantable Lead Implant Date: 20150416
Implantable Lead Implant Date: 20150922
Implantable Lead Implant Date: 20150922
Implantable Lead Location: 753858
Implantable Lead Location: 753859
Implantable Lead Location: 753860
Implantable Lead Model: 4598
Implantable Lead Model: 5076
Implantable Lead Model: 6935
Implantable Pulse Generator Implant Date: 20150416
Lead Channel Impedance Value: 266 Ohm
Lead Channel Impedance Value: 304 Ohm
Lead Channel Impedance Value: 323 Ohm
Lead Channel Impedance Value: 323 Ohm
Lead Channel Impedance Value: 342 Ohm
Lead Channel Impedance Value: 399 Ohm
Lead Channel Impedance Value: 399 Ohm
Lead Channel Impedance Value: 513 Ohm
Lead Channel Impedance Value: 513 Ohm
Lead Channel Impedance Value: 513 Ohm
Lead Channel Impedance Value: 646 Ohm
Lead Channel Impedance Value: 665 Ohm
Lead Channel Impedance Value: 665 Ohm
Lead Channel Pacing Threshold Amplitude: 0.875 V
Lead Channel Pacing Threshold Amplitude: 1.25 V
Lead Channel Pacing Threshold Amplitude: 2.25 V
Lead Channel Pacing Threshold Pulse Width: 0.4 ms
Lead Channel Pacing Threshold Pulse Width: 0.4 ms
Lead Channel Pacing Threshold Pulse Width: 1 ms
Lead Channel Sensing Intrinsic Amplitude: 2.875 mV
Lead Channel Sensing Intrinsic Amplitude: 2.875 mV
Lead Channel Sensing Intrinsic Amplitude: 8.375 mV
Lead Channel Sensing Intrinsic Amplitude: 8.375 mV
Lead Channel Setting Pacing Amplitude: 2 V
Lead Channel Setting Pacing Amplitude: 2.5 V
Lead Channel Setting Pacing Amplitude: 3.75 V
Lead Channel Setting Pacing Pulse Width: 0.4 ms
Lead Channel Setting Pacing Pulse Width: 1 ms
Lead Channel Setting Sensing Sensitivity: 0.3 mV

## 2019-03-29 NOTE — Progress Notes (Signed)
ICD Remote  

## 2019-04-08 ENCOUNTER — Telehealth: Payer: Self-pay

## 2019-04-08 NOTE — Telephone Encounter (Signed)
Transmission received and reviewed. Battery RRT at 1 month.  Spoke with [facility] device clinic and advised would send note to device triage for review regarding the sound patient heard and her battery is at 1 month.

## 2019-04-08 NOTE — Telephone Encounter (Signed)
LMOVM requesting call back to DC. Direct number provided. Will discuss sound patient's husband reported hearing.  No alerts or abnormalities noted on 04/08/19 transmission. Estimated remaining time to RRT is 1 month. Normal CRT-D function. No episodes.

## 2019-04-08 NOTE — Telephone Encounter (Signed)
Returned patient call per voice mail request.  She reports husband heard a high pitched sound coming from patient's device while she was sleeping on the night of 2/17. The noise lasted 2-3 seconds and occurred twice that night. She was sleeping soundly and did not hear the noise or have any symptoms associated with the sound.  She has felt fine and denies any current symptoms.  Advised no alerts have been received from her device and requested she send a [facility] report for review.  Will call back after report is reviewed.

## 2019-04-08 NOTE — Telephone Encounter (Signed)
Spoke with patient. Advised of normal device function. Explained that battery is nearing RRT, estimated in ~1 month. Pt agrees to call DC when she hears ambulance-type alert tone. Also scheduled for automatic monthly battery checks via home monitor. Discussed tone that pt's husband heard last night, likely due to patient's CPAP mask with magnets. Pt agrees to call [facility] (CPAP provider) to see if she can change her mask. Pt denies additional questions or concerns at this time.

## 2019-04-11 ENCOUNTER — Telehealth: Payer: Self-pay | Admitting: Emergency Medicine

## 2019-04-11 ENCOUNTER — Ambulatory Visit (INDEPENDENT_AMBULATORY_CARE_PROVIDER_SITE_OTHER): Payer: Medicare Other

## 2019-04-11 DIAGNOSIS — I5042 Chronic combined systolic (congestive) and diastolic (congestive) heart failure: Secondary | ICD-10-CM | POA: Diagnosis not present

## 2019-04-11 DIAGNOSIS — Z95810 Presence of automatic (implantable) cardiac defibrillator: Secondary | ICD-10-CM

## 2019-04-11 NOTE — Telephone Encounter (Signed)
LMOM to call office. Patients Medtronic CRT-D has reached RRT as of 04/11/19. Need to notify patient and schedule for appointment with Dr Johney Frame.

## 2019-04-11 NOTE — Telephone Encounter (Signed)
Pt returned phone call.  Advised of battery status at RRT, need OV with Dr. Johney Frame to determine next steps.  Pt would like to be seen in [place] office.  Sending to scheduling to set up.

## 2019-04-15 ENCOUNTER — Encounter: Payer: Self-pay | Admitting: Internal Medicine

## 2019-04-15 ENCOUNTER — Other Ambulatory Visit: Payer: Self-pay

## 2019-04-15 ENCOUNTER — Telehealth: Payer: Self-pay

## 2019-04-15 ENCOUNTER — Telehealth (INDEPENDENT_AMBULATORY_CARE_PROVIDER_SITE_OTHER): Payer: Medicare Other | Admitting: Internal Medicine

## 2019-04-15 VITALS — BP 134/81 | HR 71 | Ht 60.0 in | Wt 180.0 lb

## 2019-04-15 DIAGNOSIS — I5022 Chronic systolic (congestive) heart failure: Secondary | ICD-10-CM | POA: Diagnosis not present

## 2019-04-15 DIAGNOSIS — I255 Ischemic cardiomyopathy: Secondary | ICD-10-CM

## 2019-04-15 DIAGNOSIS — Z720 Tobacco use: Secondary | ICD-10-CM

## 2019-04-15 DIAGNOSIS — I2510 Atherosclerotic heart disease of native coronary artery without angina pectoris: Secondary | ICD-10-CM

## 2019-04-15 DIAGNOSIS — I447 Left bundle-branch block, unspecified: Secondary | ICD-10-CM | POA: Diagnosis not present

## 2019-04-15 DIAGNOSIS — Z79899 Other long term (current) drug therapy: Secondary | ICD-10-CM

## 2019-04-15 DIAGNOSIS — Z7982 Long term (current) use of aspirin: Secondary | ICD-10-CM | POA: Diagnosis not present

## 2019-04-15 DIAGNOSIS — Z7902 Long term (current) use of antithrombotics/antiplatelets: Secondary | ICD-10-CM | POA: Diagnosis not present

## 2019-04-15 DIAGNOSIS — G4733 Obstructive sleep apnea (adult) (pediatric): Secondary | ICD-10-CM

## 2019-04-15 DIAGNOSIS — Z01810 Encounter for preprocedural cardiovascular examination: Secondary | ICD-10-CM

## 2019-04-15 NOTE — Progress Notes (Signed)
EPIC Encounter for ICM Monitoring  Patient Name: Becky [name] is a 69 y.o. female Date: 04/15/2019 Primary Care Physican: Bedelia Person, MD Primary Cardiologist:Branch Electrophysiologist: Allred Bi-V Pacing:95.2% LastWeight: 170lbs(does not weigh at home)  Battery RRT reached :04/11/2019   Telehealth visit scheduled today with Dr Johney Frame to discuss replacement device.  OptivolThoracic impedancenormal.  Prescribed:Furosemide 40 mgtake 1tablet (40 mg total) daily. Take additional 0.5 tablet as needed for swelling.  Labs: 11/30/2018 Creatinine 1.46, BUN 16, Potassium 3.6, Sodium 140, GFR 37-42 10/11/2018 Creatinine 1.77, BUN 23, Potassium 4.5, Sodium 143, GFR 29-34  Recommendations: None  Follow-up plan: ICM clinic phone appointment on3/29/2021.   Copy of ICM check sent to Dr.Allred.  3 month ICM trend: 04/11/2019    1 Year ICM trend:       Karie Soda, RN 04/15/2019 1:20 PM

## 2019-04-15 NOTE — Progress Notes (Signed)
   Electrophysiology TeleHealth Note  Due to national recommendations of social distancing due to COVID 19, an audio telehealth visit is felt to be most appropriate for this patient at this time.  Verbal consent was obtained by me for the telehealth visit today.  The patient does not have capability for a virtual visit.  A phone visit is therefore required today.   Date:  04/15/2019   ID:  Sheretta K Phillippi, DOB 08/23/1950, MRN [id number]  Location: patient's home  Provider location:  [place]  Evaluation Performed: Follow-up visit  PCP:  Aaron, Caren T, MD   Electrophysiologist:  Dr Felix Pratt  Chief Complaint:  CHF  History of Present Illness:    Sia K Dollar is a 69 y.o. female who presents via telehealth conferencing today.  Since last being seen in our clinic, the patient reports doing very well.  She has reached RRT with her CRT-D device on 04/11/19. She reports "I feel about as good as anybody does these days".  Today, she denies symptoms of palpitations, chest pain, shortness of breath,  lower extremity edema, dizziness, presyncope, or syncope.  The patient is otherwise without complaint today.  The patient denies symptoms of fevers, chills, cough, or new SOB worrisome for COVID 19.  Past Medical History:  Diagnosis Date  . AICD (automatic cardioverter/defibrillator) present   . Anxiety   . Arthritis    "all my joints; neck, back, legs, arms, elbows"  (10/03/2015)  . Asthma   . CAD (coronary artery disease)    a. 5/08: s/p DES to PDA and DES to LAD; b. 1/09: s/p DES to pLAD; c. 1/11: s/p DES to LAD x 2, d. LHC (3/12 in [place] - EF 55%, mild ant HK, nl LM, LAD stents ok, oLAD 30, RCA stents ok;  e.  4/13:  s/p DES to RCA, f. 1/14: dLM 20-30, mOM3 20-30, mLAD 80 ISR => s/p 2.5x16 mm Promus Element DES; EF 45-50%   . CHF (congestive heart failure) (HCC)   . Chronic bronchitis (HCC)   . Chronic lower back pain    "into pelvis region" (10/03/2015)  . COPD (chronic  obstructive pulmonary disease) (HCC)   . Daily headache    "might be from sinus" (10/03/2015)  . Depression    hx (10/03/2015)  . Fibromyalgia   . GERD (gastroesophageal reflux disease)   . Heart murmur   . HLD (hyperlipidemia)   . Hypertension   . Ischemic cardiomyopathy    a. Echo (11/14):  EF 25%   . LBBB (left bundle branch block)   . Myocardial infarction (HCC) 12/2012  . OSA on CPAP   . Pneumonia 1950s  . Syncope 11/14   LifeVest placed  . Upper back pain, chronic    "into my shoulders; fibromyalgia works here" (10/03/2015)    Past Surgical History:  Procedure Laterality Date  . BI-VENTRICULAR IMPLANTABLE CARDIOVERTER DEFIBRILLATOR N/A 06/02/2013   Procedure: BI-VENTRICULAR IMPLANTABLE CARDIOVERTER DEFIBRILLATOR  (CRT-D);  Surgeon: Merrin Mcvicker D Shifa Brisbon, MD;  Location: [facility] CATH LAB;  Service: Cardiovascular;  Laterality: N/A;  . BI-VENTRICULAR IMPLANTABLE CARDIOVERTER DEFIBRILLATOR  (CRT-D)  06/02/2013   MDT VivaQuad CRTD implanted by Dr Leathie Weich for ICM, CHF  . CARDIAC CATHETERIZATION  12/2012   S/P MI  . CORONARY ANGIOPLASTY WITH STENT PLACEMENT  2007; 2009; 2011   "?2 +1 +1 "  . CORONARY ANGIOPLASTY WITH STENT PLACEMENT  02/2012  . FRACTURE SURGERY    . HYSTEROSCOPY W/ ENDOMETRIAL ABLATION  ~ 1998  .   LEAD REVISION  11-08-13   RA and LV lead revision by Dr Rayann Heman  . LEAD REVISION Left 11/08/2013   Procedure: LEAD REVISION;  Surgeon: Coralyn Mark, MD;  Location: [facility] CATH LAB;  Service: Cardiovascular;  Laterality: Left;  . LEFT HEART CATHETERIZATION WITH CORONARY ANGIOGRAM N/A 01/03/2013   Procedure: LEFT HEART CATHETERIZATION WITH CORONARY ANGIOGRAM;  Surgeon: Burnell Blanks, MD;  Location: [facility] CATH LAB;  Service: Cardiovascular;  Laterality: N/A;  . ORIF ANKLE FRACTURE Right 10/21/2009  . TUBAL LIGATION  1989    Current Outpatient Medications  Medication Sig Dispense Refill  . acetaminophen-codeine (TYLENOL #3) 300-30 MG tablet Take 1 tablet by mouth every 6 (six) hours  as needed for moderate pain.   2  . aspirin EC 81 MG tablet Take 1 tablet (81 mg total) by mouth daily.    . Aspirin-Salicylamide-Caffeine (BC HEADACHE PO) Take 1 each by mouth as needed.     . cetirizine (ZYRTEC) 10 MG tablet Take 1 tablet by mouth daily.  4  . Cholecalciferol (VITAMIN D) 2000 UNITS CAPS Take 2,000 Units by mouth daily.    . clopidogrel (PLAVIX) 75 MG tablet Take 75 mg by mouth daily.     Marland Kitchen escitalopram (LEXAPRO) 20 MG tablet Take 20 mg by mouth daily.     . fluticasone (FLONASE) 50 MCG/ACT nasal spray Place 2 sprays into both nostrils daily.  7  . formoterol (PERFOROMIST) 20 MCG/2ML nebulizer solution Take 20 mcg by nebulization 2 (two) times daily.    . furosemide (LASIX) 40 MG tablet TAKE 1 TABLET DAILY MAY TAKE ADDITIONAL 1/2 TABLET AS NEEDED FOR SWELLING 90 tablet 1  . gabapentin (NEURONTIN) 300 MG capsule Take 1 capsule by mouth 3 (three) times daily.    Marland Kitchen ipratropium-albuterol (DUONEB) 0.5-2.5 (3) MG/3ML SOLN Take 3 mLs by nebulization every 6 (six) hours as needed (shortness of breath).    . lactulose (CHRONULAC) 10 GM/15ML solution Take 10 g by mouth 2 (two) times daily as needed for mild constipation.    . magnesium oxide (MAG-OX) 400 MG tablet Take 400 mg by mouth daily.    . metFORMIN (GLUCOPHAGE) 500 MG tablet Take 1,000 mg by mouth 2 (two) times daily with a meal.     . metoprolol succinate (TOPROL-XL) 50 MG 24 hr tablet TAKE 1 TABLET BY MOUTH DAILY WITH OR IMMEDIATELY FOLLOWING A MEAL(DOSE DECREASE) 90 tablet 3  . montelukast (SINGULAIR) 10 MG tablet Take 10 mg by mouth every morning.     . nitroGLYCERIN (NITROSTAT) 0.4 MG SL tablet Place 1 tablet (0.4 mg total) under the tongue every 5 (five) minutes x 3 doses as needed. If no relief after 3rd dose, proceed to ED 25 tablet 5  . pantoprazole (PROTONIX) 40 MG tablet Take 1 tablet (40 mg total) by mouth daily. 90 tablet 2  . rOPINIRole (REQUIP) 0.5 MG tablet Take 0.5 mg by mouth at bedtime. 2 tablets at bedtime    .  spironolactone (ALDACTONE) 25 MG tablet Take 0.5 tablets (12.5 mg total) by mouth daily. 45 tablet 3  . pravastatin (PRAVACHOL) 80 MG tablet Take 1 tablet (80 mg total) by mouth every evening. 90 tablet 1   No current facility-administered medications for this visit.    Allergies:   Tape, Codeine, Prozac [fluoxetine hcl], Fosamax [alendronate sodium], Keflex [cephalexin], and Lipitor [atorvastatin]   Social History:  The patient  reports that she has been smoking cigarettes. She started smoking about 28 years ago. She has a  10.00 pack-year smoking history. She has never used smokeless tobacco. She reports current alcohol use. She reports that she does not use drugs.   Family History:  The patient's family history includes Breast cancer in her mother, sister, and sister; Heart failure in her brother; Uterine cancer in her sister.   ROS:  Please see the history of present illness.   All other systems are personally reviewed and negative.    Exam:    Vital Signs:  BP 134/81   Pulse 71   Ht 5' (1.524 m)   Wt 180 lb (81.6 kg)   BMI 35.15 kg/m   Well sounding, alert and conversant   Labs/Other Tests and Data Reviewed:    Recent Labs: No results found for requested labs within last 8760 hours.   Wt Readings from Last 3 Encounters:  04/15/19 180 lb (81.6 kg)  03/15/19 184 lb (83.5 kg)  10/29/18 175 lb (79.4 kg)     Last device remote is reviewed from PaceART PDF which reveals normal device function, no arrhythmias    ASSESSMENT & PLAN:    1.  Ischemic CM/ CAD/ chronic systolic dysfunction/ LBBB EF has improved from 25% in 2014 to 65% in 2017.  We will update echo at this time. No CHF symptoms No ischemic symptoms Risks, benefits, and alternatives to BiV ICD pulse generator replacement were discussed in detail today.  The patient understands that risks include but are not limited to bleeding, infection, pneumothorax, perforation, tamponade, vascular damage, renal failure, MI,  stroke, death, inappropriate shocks, damage to his existing leads, and lead dislodgement and wishes to proceed.  We will therefore schedule the procedure at the next available time.  Will continue ASA and plavix for the procedure.  2. OSA Compliance with CPAP is encouraged  3. Tobacco Cessation is advised   Patient Risk:  after full review of this patients clinical status, I feel that they are at moderate risk at this time.  Today, I have spent 15 minutes with the patient with telehealth technology discussing arrhythmia management .    Randolm Idol, MD  04/15/2019 12:48 PM     [facility] [address] [place] [address] [phone] (office) [phone] (fax)

## 2019-04-15 NOTE — Telephone Encounter (Signed)
-----   Message from Hillis Range, MD sent at 04/15/2019 12:55 PM EST ----- Please schedule for BIV ICD generator change at next available time.  [employer]

## 2019-04-15 NOTE — Telephone Encounter (Signed)
Pt agrees to BIV ICD change out with Dr. Johney Frame 04/29/19 at 1:30pm...Marland KitchenMarland KitchenMarland Kitchenpt to have echo, labs, covid test and prep pick up 04/26/19..   Advised pt I will work on her instructions and call her back to go over with her.

## 2019-04-15 NOTE — H&P (View-Only) (Signed)
Electrophysiology TeleHealth Note  Due to national recommendations of social distancing due to COVID 19, an audio telehealth visit is felt to be most appropriate for this patient at this time.  Verbal consent was obtained by me for the telehealth visit today.  The patient does not have capability for a virtual visit.  A phone visit is therefore required today.   Date:  04/15/2019   ID:  Becky [name], DOB November 17, 1950, MRN [id number]  Location: patient's home  Provider location:  [place]  Evaluation Performed: Follow-up visit  PCP:  Bedelia Person, MD   Electrophysiologist:  Dr Johney Frame  Chief Complaint:  CHF  History of Present Illness:    Becky [name] is a 69 y.o. female who presents via telehealth conferencing today.  Since last being seen in our clinic, the patient reports doing very well.  She has reached RRT with her CRT-D device on 04/11/19. She reports "I feel about as good as anybody does these days".  Today, she denies symptoms of palpitations, chest pain, shortness of breath,  lower extremity edema, dizziness, presyncope, or syncope.  The patient is otherwise without complaint today.  The patient denies symptoms of fevers, chills, cough, or new SOB worrisome for COVID 19.  Past Medical History:  Diagnosis Date  . AICD (automatic cardioverter/defibrillator) present   . Anxiety   . Arthritis    "all my joints; neck, back, legs, arms, elbows"  (10/03/2015)  . Asthma   . CAD (coronary artery disease)    a. 5/08: s/p DES to PDA and DES to LAD; b. 1/09: s/p DES to pLAD; c. 1/11: s/p DES to LAD x 2, d. LHC (3/12 in [place] - EF 55%, mild ant HK, nl LM, LAD stents ok, oLAD 30, RCA stents ok;  e.  4/13:  s/p DES to RCA, f. 1/14: dLM 20-30, mOM3 20-30, mLAD 80 ISR => s/p 2.5x16 mm Promus Element DES; EF 45-50%   . CHF (congestive heart failure) (HCC)   . Chronic bronchitis (HCC)   . Chronic lower back pain    "into pelvis region" (10/03/2015)  . COPD (chronic  obstructive pulmonary disease) (HCC)   . Daily headache    "might be from sinus" (10/03/2015)  . Depression    hx (10/03/2015)  . Fibromyalgia   . GERD (gastroesophageal reflux disease)   . Heart murmur   . HLD (hyperlipidemia)   . Hypertension   . Ischemic cardiomyopathy    a. Echo (11/14):  EF 25%   . LBBB (left bundle branch block)   . Myocardial infarction (HCC) 12/2012  . OSA on CPAP   . Pneumonia 1950s  . Syncope 11/14   LifeVest placed  . Upper back pain, chronic    "into my shoulders; fibromyalgia works here" (10/03/2015)    Past Surgical History:  Procedure Laterality Date  . BI-VENTRICULAR IMPLANTABLE CARDIOVERTER DEFIBRILLATOR N/A 06/02/2013   Procedure: BI-VENTRICULAR IMPLANTABLE CARDIOVERTER DEFIBRILLATOR  (CRT-D);  Surgeon: Gardiner Rhyme, MD;  Location: [facility] CATH LAB;  Service: Cardiovascular;  Laterality: N/A;  . BI-VENTRICULAR IMPLANTABLE CARDIOVERTER DEFIBRILLATOR  (CRT-D)  06/02/2013   MDT VivaQuad CRTD implanted by Dr Johney Frame for ICM, CHF  . CARDIAC CATHETERIZATION  12/2012   S/P MI  . CORONARY ANGIOPLASTY WITH STENT PLACEMENT  2007; 2009; 2011   "?2 +1 +1 "  . CORONARY ANGIOPLASTY WITH STENT PLACEMENT  02/2012  . FRACTURE SURGERY    . HYSTEROSCOPY W/ ENDOMETRIAL ABLATION  ~ 1998  .  LEAD REVISION  11-08-13   RA and LV lead revision by Dr Rayann Heman  . LEAD REVISION Left 11/08/2013   Procedure: LEAD REVISION;  Surgeon: Coralyn Mark, MD;  Location: [facility] CATH LAB;  Service: Cardiovascular;  Laterality: Left;  . LEFT HEART CATHETERIZATION WITH CORONARY ANGIOGRAM N/A 01/03/2013   Procedure: LEFT HEART CATHETERIZATION WITH CORONARY ANGIOGRAM;  Surgeon: Burnell Blanks, MD;  Location: [facility] CATH LAB;  Service: Cardiovascular;  Laterality: N/A;  . ORIF ANKLE FRACTURE Right 10/21/2009  . TUBAL LIGATION  1989    Current Outpatient Medications  Medication Sig Dispense Refill  . acetaminophen-codeine (TYLENOL #3) 300-30 MG tablet Take 1 tablet by mouth every 6 (six) hours  as needed for moderate pain.   2  . aspirin EC 81 MG tablet Take 1 tablet (81 mg total) by mouth daily.    . Aspirin-Salicylamide-Caffeine (BC HEADACHE PO) Take 1 each by mouth as needed.     . cetirizine (ZYRTEC) 10 MG tablet Take 1 tablet by mouth daily.  4  . Cholecalciferol (VITAMIN D) 2000 UNITS CAPS Take 2,000 Units by mouth daily.    . clopidogrel (PLAVIX) 75 MG tablet Take 75 mg by mouth daily.     Marland Kitchen escitalopram (LEXAPRO) 20 MG tablet Take 20 mg by mouth daily.     . fluticasone (FLONASE) 50 MCG/ACT nasal spray Place 2 sprays into both nostrils daily.  7  . formoterol (PERFOROMIST) 20 MCG/2ML nebulizer solution Take 20 mcg by nebulization 2 (two) times daily.    . furosemide (LASIX) 40 MG tablet TAKE 1 TABLET DAILY MAY TAKE ADDITIONAL 1/2 TABLET AS NEEDED FOR SWELLING 90 tablet 1  . gabapentin (NEURONTIN) 300 MG capsule Take 1 capsule by mouth 3 (three) times daily.    Marland Kitchen ipratropium-albuterol (DUONEB) 0.5-2.5 (3) MG/3ML SOLN Take 3 mLs by nebulization every 6 (six) hours as needed (shortness of breath).    . lactulose (CHRONULAC) 10 GM/15ML solution Take 10 g by mouth 2 (two) times daily as needed for mild constipation.    . magnesium oxide (MAG-OX) 400 MG tablet Take 400 mg by mouth daily.    . metFORMIN (GLUCOPHAGE) 500 MG tablet Take 1,000 mg by mouth 2 (two) times daily with a meal.     . metoprolol succinate (TOPROL-XL) 50 MG 24 hr tablet TAKE 1 TABLET BY MOUTH DAILY WITH OR IMMEDIATELY FOLLOWING A MEAL(DOSE DECREASE) 90 tablet 3  . montelukast (SINGULAIR) 10 MG tablet Take 10 mg by mouth every morning.     . nitroGLYCERIN (NITROSTAT) 0.4 MG SL tablet Place 1 tablet (0.4 mg total) under the tongue every 5 (five) minutes x 3 doses as needed. If no relief after 3rd dose, proceed to ED 25 tablet 5  . pantoprazole (PROTONIX) 40 MG tablet Take 1 tablet (40 mg total) by mouth daily. 90 tablet 2  . rOPINIRole (REQUIP) 0.5 MG tablet Take 0.5 mg by mouth at bedtime. 2 tablets at bedtime    .  spironolactone (ALDACTONE) 25 MG tablet Take 0.5 tablets (12.5 mg total) by mouth daily. 45 tablet 3  . pravastatin (PRAVACHOL) 80 MG tablet Take 1 tablet (80 mg total) by mouth every evening. 90 tablet 1   No current facility-administered medications for this visit.    Allergies:   Tape, Codeine, Prozac [fluoxetine hcl], Fosamax [alendronate sodium], Keflex [cephalexin], and Lipitor [atorvastatin]   Social History:  The patient  reports that she has been smoking cigarettes. She started smoking about 28 years ago. She has a  10.00 pack-year smoking history. She has never used smokeless tobacco. She reports current alcohol use. She reports that she does not use drugs.   Family History:  The patient's family history includes Breast cancer in her mother, sister, and sister; Heart failure in her brother; Uterine cancer in her sister.   ROS:  Please see the history of present illness.   All other systems are personally reviewed and negative.    Exam:    Vital Signs:  BP 134/81   Pulse 71   Ht 5' (1.524 m)   Wt 180 lb (81.6 kg)   BMI 35.15 kg/m   Well sounding, alert and conversant   Labs/Other Tests and Data Reviewed:    Recent Labs: No results found for requested labs within last 8760 hours.   Wt Readings from Last 3 Encounters:  04/15/19 180 lb (81.6 kg)  03/15/19 184 lb (83.5 kg)  10/29/18 175 lb (79.4 kg)     Last device remote is reviewed from PaceART PDF which reveals normal device function, no arrhythmias    ASSESSMENT & PLAN:    1.  Ischemic CM/ CAD/ chronic systolic dysfunction/ LBBB EF has improved from 25% in 2014 to 65% in 2017.  We will update echo at this time. No CHF symptoms No ischemic symptoms Risks, benefits, and alternatives to BiV ICD pulse generator replacement were discussed in detail today.  The patient understands that risks include but are not limited to bleeding, infection, pneumothorax, perforation, tamponade, vascular damage, renal failure, MI,  stroke, death, inappropriate shocks, damage to his existing leads, and lead dislodgement and wishes to proceed.  We will therefore schedule the procedure at the next available time.  Will continue ASA and plavix for the procedure.  2. OSA Compliance with CPAP is encouraged  3. Tobacco Cessation is advised   Patient Risk:  after full review of this patients clinical status, I feel that they are at moderate risk at this time.  Today, I have spent 15 minutes with the patient with telehealth technology discussing arrhythmia management .    Randolm Idol, MD  04/15/2019 12:48 PM     [facility] [address] [place] [address] [phone] (office) [phone] (fax)

## 2019-04-18 ENCOUNTER — Telehealth: Payer: Self-pay

## 2019-04-18 ENCOUNTER — Other Ambulatory Visit: Payer: Self-pay

## 2019-04-18 MED ORDER — FUROSEMIDE 40 MG PO TABS: ORAL_TABLET | ORAL | 1 refills | Status: DC

## 2019-04-18 NOTE — Telephone Encounter (Signed)
refill sent for furosemide

## 2019-04-18 NOTE — Telephone Encounter (Signed)
Returned call to patient as requested by voice mail.  She reports device battery change is scheduled for 3/12 but she did not hear what time the pre procedure tests will be done. Provided time and dates of lab, echo and COVID tests on 3/9.  She asked if the echo will be done at the [place] street office.  Advised will confirm the information with Dr Jenel Lucks nurse and call her back.

## 2019-04-18 NOTE — Telephone Encounter (Signed)
Call to patient and advised the echo is scheduled at the [facility] street office and she should arrive about 30 minutes early to get labs drawn.  There should be a letter with device replacement procedure instructions and soap for her a the front desk when she checks in and if not given to her then she needs to ask for it.  Advised COVID testing is done at the [facility] hospital and she can ask the [facility] street office for directions if needed.  She verbalized understanding of appointments.  Explained the new device will take about 6 weeks for normal baseline fluid levels to develop and will schedule ICM appointment about 6 weeks from procedure date. Advised to call if she experiences any fluid symptoms prior to the 6 week appointment.

## 2019-04-20 NOTE — Telephone Encounter (Signed)
Pt scheduled for gen change  Labs/covid test scheduled.  Call placed to Pt and instruction given.  Advised to pick up instruction letter at check in on 3/9  Work up complete

## 2019-04-26 ENCOUNTER — Other Ambulatory Visit (HOSPITAL_COMMUNITY)
Admission: RE | Admit: 2019-04-26 | Discharge: 2019-04-26 | Disposition: A | Discharge status: Home / Self Care | Payer: Medicare Other | Source: Ambulatory Visit | Attending: Internal Medicine | Admitting: Internal Medicine

## 2019-04-26 ENCOUNTER — Ambulatory Visit (HOSPITAL_BASED_OUTPATIENT_CLINIC_OR_DEPARTMENT_OTHER): Payer: Medicare Other

## 2019-04-26 ENCOUNTER — Other Ambulatory Visit: Payer: Self-pay

## 2019-04-26 ENCOUNTER — Other Ambulatory Visit: Payer: Medicare Other

## 2019-04-26 DIAGNOSIS — Z79899 Other long term (current) drug therapy: Secondary | ICD-10-CM

## 2019-04-26 DIAGNOSIS — I509 Heart failure, unspecified: Secondary | ICD-10-CM | POA: Insufficient documentation

## 2019-04-26 DIAGNOSIS — F17210 Nicotine dependence, cigarettes, uncomplicated: Secondary | ICD-10-CM | POA: Insufficient documentation

## 2019-04-26 DIAGNOSIS — J449 Chronic obstructive pulmonary disease, unspecified: Secondary | ICD-10-CM | POA: Diagnosis not present

## 2019-04-26 DIAGNOSIS — I5022 Chronic systolic (congestive) heart failure: Secondary | ICD-10-CM | POA: Diagnosis not present

## 2019-04-26 DIAGNOSIS — Z01810 Encounter for preprocedural cardiovascular examination: Secondary | ICD-10-CM | POA: Diagnosis not present

## 2019-04-26 DIAGNOSIS — Z01818 Encounter for other preprocedural examination: Secondary | ICD-10-CM | POA: Diagnosis present

## 2019-04-26 DIAGNOSIS — I255 Ischemic cardiomyopathy: Secondary | ICD-10-CM

## 2019-04-26 DIAGNOSIS — I219 Acute myocardial infarction, unspecified: Secondary | ICD-10-CM | POA: Insufficient documentation

## 2019-04-26 DIAGNOSIS — I2510 Atherosclerotic heart disease of native coronary artery without angina pectoris: Secondary | ICD-10-CM | POA: Insufficient documentation

## 2019-04-26 DIAGNOSIS — E785 Hyperlipidemia, unspecified: Secondary | ICD-10-CM | POA: Insufficient documentation

## 2019-04-26 DIAGNOSIS — Z20822 Contact with and (suspected) exposure to covid-19: Secondary | ICD-10-CM | POA: Diagnosis not present

## 2019-04-26 DIAGNOSIS — I252 Old myocardial infarction: Secondary | ICD-10-CM | POA: Insufficient documentation

## 2019-04-26 DIAGNOSIS — R002 Palpitations: Secondary | ICD-10-CM | POA: Diagnosis not present

## 2019-04-27 LAB — BASIC METABOLIC PANEL
BUN/Creatinine Ratio: 9 — ABNORMAL LOW (ref 12–28)
BUN: 16 mg/dL (ref 8–27)
CO2: 24 mmol/L (ref 20–29)
Calcium: 9.5 mg/dL (ref 8.7–10.3)
Chloride: 105 mmol/L (ref 96–106)
Creatinine, Ser: 1.76 mg/dL — ABNORMAL HIGH (ref 0.57–1.00)
GFR calc Af Amer: 34 mL/min/{1.73_m2} — ABNORMAL LOW (ref >59)
GFR calc non Af Amer: 29 mL/min/{1.73_m2} — ABNORMAL LOW (ref >59)
Glucose: 121 mg/dL — ABNORMAL HIGH (ref 65–99)
Potassium: 4.5 mmol/L (ref 3.5–5.2)
Sodium: 143 mmol/L (ref 134–144)

## 2019-04-27 LAB — CBC WITH DIFFERENTIAL/PLATELET
Basophils Absolute: 0.1 10*3/uL (ref 0.0–0.2)
Basos: 1 %
EOS (ABSOLUTE): 0.7 10*3/uL — ABNORMAL HIGH (ref 0.0–0.4)
Eos: 6 %
Hematocrit: 40.5 % (ref 34.0–46.6)
Hemoglobin: 13.6 g/dL (ref 11.1–15.9)
Immature Grans (Abs): 0 10*3/uL (ref 0.0–0.1)
Immature Granulocytes: 0 %
Lymphocytes Absolute: 3.2 10*3/uL — ABNORMAL HIGH (ref 0.7–3.1)
Lymphs: 28 %
MCH: 31.9 pg (ref 26.6–33.0)
MCHC: 33.6 g/dL (ref 31.5–35.7)
MCV: 95 fL (ref 79–97)
Monocytes Absolute: 0.6 10*3/uL (ref 0.1–0.9)
Monocytes: 5 %
Neutrophils Absolute: 6.8 10*3/uL (ref 1.4–7.0)
Neutrophils: 60 %
Platelets: 261 10*3/uL (ref 150–450)
RBC: 4.27 x10E6/uL (ref 3.77–5.28)
RDW: 13.1 % (ref 11.7–15.4)
WBC: 11.3 10*3/uL — ABNORMAL HIGH (ref 3.4–10.8)

## 2019-04-27 LAB — SARS CORONAVIRUS 2 (TAT 6-24 HRS): SARS Coronavirus 2: NEGATIVE

## 2019-04-28 ENCOUNTER — Telehealth: Payer: Self-pay | Admitting: Internal Medicine

## 2019-04-28 NOTE — Progress Notes (Signed)
Instructed patient on the following items: Arrival time 1130 Nothing to eat or drink after midnight No meds AM of procedure Responsible person to drive you home and stay with you for 24 hrs Wash with special soap night before and morning of procedure  

## 2019-04-28 NOTE — Telephone Encounter (Signed)
Left message for patient about procedure time tomorrow. Asked patient to arrive at [facility] instead of 11:30AM.  Asked her to call the office with any questions.  Gypsy Balsam, NP 04/28/2019 2:04 PM

## 2019-04-29 ENCOUNTER — Encounter (HOSPITAL_COMMUNITY)
Admission: RE | Disposition: A | Discharge status: Home / Self Care | Payer: Self-pay | Source: Home / Self Care | Attending: Internal Medicine

## 2019-04-29 ENCOUNTER — Ambulatory Visit (HOSPITAL_COMMUNITY)
Admission: RE | Admit: 2019-04-29 | Discharge: 2019-04-29 | Disposition: A | Discharge status: Home / Self Care | Payer: Medicare Other | Attending: Internal Medicine | Admitting: Internal Medicine

## 2019-04-29 ENCOUNTER — Other Ambulatory Visit: Payer: Self-pay

## 2019-04-29 DIAGNOSIS — Z7984 Long term (current) use of oral hypoglycemic drugs: Secondary | ICD-10-CM | POA: Insufficient documentation

## 2019-04-29 DIAGNOSIS — Z79899 Other long term (current) drug therapy: Secondary | ICD-10-CM | POA: Insufficient documentation

## 2019-04-29 DIAGNOSIS — Z881 Allergy status to other antibiotic agents status: Secondary | ICD-10-CM | POA: Insufficient documentation

## 2019-04-29 DIAGNOSIS — I252 Old myocardial infarction: Secondary | ICD-10-CM | POA: Insufficient documentation

## 2019-04-29 DIAGNOSIS — F329 Major depressive disorder, single episode, unspecified: Secondary | ICD-10-CM | POA: Diagnosis not present

## 2019-04-29 DIAGNOSIS — M797 Fibromyalgia: Secondary | ICD-10-CM | POA: Diagnosis not present

## 2019-04-29 DIAGNOSIS — Z7902 Long term (current) use of antithrombotics/antiplatelets: Secondary | ICD-10-CM | POA: Insufficient documentation

## 2019-04-29 DIAGNOSIS — Z888 Allergy status to other drugs, medicaments and biological substances status: Secondary | ICD-10-CM | POA: Insufficient documentation

## 2019-04-29 DIAGNOSIS — I447 Left bundle-branch block, unspecified: Secondary | ICD-10-CM | POA: Insufficient documentation

## 2019-04-29 DIAGNOSIS — Z955 Presence of coronary angioplasty implant and graft: Secondary | ICD-10-CM | POA: Diagnosis not present

## 2019-04-29 DIAGNOSIS — I2510 Atherosclerotic heart disease of native coronary artery without angina pectoris: Secondary | ICD-10-CM | POA: Diagnosis not present

## 2019-04-29 DIAGNOSIS — J449 Chronic obstructive pulmonary disease, unspecified: Secondary | ICD-10-CM | POA: Diagnosis not present

## 2019-04-29 DIAGNOSIS — G4733 Obstructive sleep apnea (adult) (pediatric): Secondary | ICD-10-CM | POA: Insufficient documentation

## 2019-04-29 DIAGNOSIS — F419 Anxiety disorder, unspecified: Secondary | ICD-10-CM | POA: Diagnosis not present

## 2019-04-29 DIAGNOSIS — F17210 Nicotine dependence, cigarettes, uncomplicated: Secondary | ICD-10-CM | POA: Insufficient documentation

## 2019-04-29 DIAGNOSIS — K219 Gastro-esophageal reflux disease without esophagitis: Secondary | ICD-10-CM | POA: Diagnosis not present

## 2019-04-29 DIAGNOSIS — I509 Heart failure, unspecified: Secondary | ICD-10-CM | POA: Insufficient documentation

## 2019-04-29 DIAGNOSIS — I255 Ischemic cardiomyopathy: Secondary | ICD-10-CM | POA: Insufficient documentation

## 2019-04-29 DIAGNOSIS — Z4502 Encounter for adjustment and management of automatic implantable cardiac defibrillator: Secondary | ICD-10-CM | POA: Insufficient documentation

## 2019-04-29 DIAGNOSIS — Z885 Allergy status to narcotic agent status: Secondary | ICD-10-CM | POA: Insufficient documentation

## 2019-04-29 DIAGNOSIS — Z7982 Long term (current) use of aspirin: Secondary | ICD-10-CM | POA: Diagnosis not present

## 2019-04-29 DIAGNOSIS — E785 Hyperlipidemia, unspecified: Secondary | ICD-10-CM | POA: Diagnosis not present

## 2019-04-29 DIAGNOSIS — I110 Hypertensive heart disease with heart failure: Secondary | ICD-10-CM | POA: Insufficient documentation

## 2019-04-29 HISTORY — PX: BIV ICD GENERATOR CHANGEOUT: EP1194

## 2019-04-29 LAB — GLUCOSE, CAPILLARY
Glucose-Capillary: 150 mg/dL — ABNORMAL HIGH (ref 70–99)
Glucose-Capillary: 189 mg/dL — ABNORMAL HIGH (ref 70–99)

## 2019-04-29 SURGERY — BIV ICD GENERATOR CHANGEOUT

## 2019-04-29 MED ORDER — SODIUM CHLORIDE 0.9 % IV SOLN: INTRAVENOUS | Status: DC

## 2019-04-29 MED ORDER — SODIUM CHLORIDE 0.9 % IV SOLN: 250.0000 mL | INTRAVENOUS | Status: DC | PRN

## 2019-04-29 MED ORDER — LIDOCAINE HCL (PF) 1 % IJ SOLN
INTRAMUSCULAR | Status: DC | PRN
  Administered 2019-04-29: 60 mL

## 2019-04-29 MED ORDER — VANCOMYCIN HCL IN DEXTROSE 1-5 GM/200ML-% IV SOLN
INTRAVENOUS | Status: AC
  Filled 2019-04-29: qty 200

## 2019-04-29 MED ORDER — LIDOCAINE HCL 1 % IJ SOLN
INTRAMUSCULAR | Status: AC
  Filled 2019-04-29: qty 60

## 2019-04-29 MED ORDER — ACETAMINOPHEN 325 MG PO TABS: 325.0000 mg | ORAL_TABLET | ORAL | Status: DC | PRN

## 2019-04-29 MED ORDER — CHLORHEXIDINE GLUCONATE 4 % EX LIQD
4.0000 "application " | Freq: Once | CUTANEOUS | Status: DC
  Filled 2019-04-29: qty 60

## 2019-04-29 MED ORDER — FENTANYL CITRATE (PF) 100 MCG/2ML IJ SOLN
INTRAMUSCULAR | Status: AC
  Filled 2019-04-29: qty 2

## 2019-04-29 MED ORDER — ONDANSETRON HCL 4 MG/2ML IJ SOLN: 4.0000 mg | Freq: Four times a day (QID) | INTRAMUSCULAR | Status: DC | PRN

## 2019-04-29 MED ORDER — MIDAZOLAM HCL 5 MG/5ML IJ SOLN
INTRAMUSCULAR | Status: DC | PRN
  Administered 2019-04-29: 1 mg via INTRAVENOUS

## 2019-04-29 MED ORDER — SODIUM CHLORIDE 0.9 % IV SOLN
INTRAVENOUS | Status: AC
  Filled 2019-04-29: qty 2

## 2019-04-29 MED ORDER — SODIUM CHLORIDE 0.9% FLUSH: 3.0000 mL | Freq: Two times a day (BID) | INTRAVENOUS | Status: DC

## 2019-04-29 MED ORDER — SODIUM CHLORIDE 0.9 % IV SOLN
80.0000 mg | INTRAVENOUS | Status: AC
  Administered 2019-04-29: 80 mg
  Filled 2019-04-29: qty 2

## 2019-04-29 MED ORDER — VANCOMYCIN HCL IN DEXTROSE 1-5 GM/200ML-% IV SOLN
1000.0000 mg | INTRAVENOUS | Status: AC
  Administered 2019-04-29: 12:00:00 1000 mg via INTRAVENOUS
  Filled 2019-04-29: qty 200

## 2019-04-29 MED ORDER — SODIUM CHLORIDE 0.9% FLUSH: 3.0000 mL | INTRAVENOUS | Status: DC | PRN

## 2019-04-29 MED ORDER — MIDAZOLAM HCL 5 MG/5ML IJ SOLN
INTRAMUSCULAR | Status: AC
  Filled 2019-04-29: qty 5

## 2019-04-29 SURGICAL SUPPLY — 4 items
CABLE SURGICAL S-101-97-12 (CABLE) ×3 IMPLANT
ICD CLARIA MRI DTMA1Q1 (ICD Generator) ×3 IMPLANT
PAD PRO RADIOLUCENT 2001M-C (PAD) ×3 IMPLANT
TRAY PACEMAKER INSERTION (PACKS) ×3 IMPLANT

## 2019-04-29 NOTE — Discharge Instructions (Signed)

## 2019-04-29 NOTE — Interval H&P Note (Signed)
History and Physical Interval Note:  04/29/2019 10:39 AM  Becky [name]  has presented today for surgery, with the diagnosis of eri.  The various methods of treatment have been discussed with the patient and family. After consideration of risks, benefits and other options for treatment, the patient has consented to  Procedure(s): BIV ICD GENERATOR CHANGEOUT (N/A) as a surgical intervention.  The patient's history has been reviewed, patient examined, no change in status, stable for surgery.  I have reviewed the patient's chart and labs.  Questions were answered to the patient's satisfaction.    ICD Criteria  Current LVEF:55%. Within 12 months prior to implant: Yes   Heart failure history: Yes, Class II  Cardiomyopathy history: Yes, Ischemic Cardiomyopathy - Prior MI.  Atrial Fibrillation/Atrial Flutter: No.  Ventricular tachycardia history: No.  Cardiac arrest history: No.  History of syndromes with risk of sudden death: No.  Previous ICD: Yes, Reason for ICD:  Primary prevention.  Current ICD indication: Primary  PPM indication: Yes CRT for LBBB  Beta Blocker therapy for 3 or more months: Yes, prescribed.   Ace Inhibitor/ARB therapy for 3 or more months: no, medical reason Per Dr Verna Czech note 03/15/19, her ACEi was stopped due to hypotension.   I have seen Becky [name] is a 69 y.o. female for consideration of BIV ICD generator change for prevention of sudden death.  The patient's chart has been reviewed and they meet criteria for BiVICD generator change.  I have had a thorough discussion with the patient reviewing options.  The patient has had opportunities to ask questions and have them answered. The patient and I have decided together through the [facility] Share Decision Support Tool to proceed with BiV ICD generator change at this time.  Risks, benefits, alternatives to BIV ICD generator change were discussed in detail with the patient today. The patient   understands that the risks include but are not limited to bleeding, infection, pneumothorax, perforation, tamponade, vascular damage, renal failure, MI, stroke, death, inappropriate shocks, and lead dislodgement and  wishes to proceed.    Hillis Range

## 2019-05-09 LAB — CUP PACEART REMOTE DEVICE CHECK
Date Time Interrogation Session: 20210322125341
HighPow Impedance: 72 Ohm
Implantable Lead Implant Date: 20150416
Implantable Lead Implant Date: 20150922
Implantable Lead Implant Date: 20150922
Implantable Lead Location: 753858
Implantable Lead Location: 753859
Implantable Lead Location: 753860
Implantable Lead Model: 4598
Implantable Lead Model: 5076
Implantable Lead Model: 6935
Implantable Pulse Generator Implant Date: 20150416
Lead Channel Impedance Value: 323 Ohm
Lead Channel Impedance Value: 399 Ohm
Lead Channel Impedance Value: 437 Ohm
Lead Channel Setting Pacing Amplitude: 2 V
Lead Channel Setting Pacing Amplitude: 2.5 V
Lead Channel Setting Pacing Amplitude: 3.75 V
Lead Channel Setting Pacing Pulse Width: 0.4 ms
Lead Channel Setting Pacing Pulse Width: 1 ms
Lead Channel Setting Sensing Sensitivity: 0.3 mV

## 2019-05-10 ENCOUNTER — Other Ambulatory Visit: Payer: Self-pay

## 2019-05-10 ENCOUNTER — Ambulatory Visit (INDEPENDENT_AMBULATORY_CARE_PROVIDER_SITE_OTHER): Payer: Medicare Other | Admitting: *Deleted

## 2019-05-10 DIAGNOSIS — I5022 Chronic systolic (congestive) heart failure: Secondary | ICD-10-CM

## 2019-05-10 DIAGNOSIS — I255 Ischemic cardiomyopathy: Secondary | ICD-10-CM

## 2019-05-10 LAB — CUP PACEART INCLINIC DEVICE CHECK
Date Time Interrogation Session: 20210323171501
Implantable Lead Implant Date: 20150416
Implantable Lead Implant Date: 20150922
Implantable Lead Implant Date: 20150922
Implantable Lead Location: 753858
Implantable Lead Location: 753859
Implantable Lead Location: 753860
Implantable Lead Model: 4598
Implantable Lead Model: 5076
Implantable Lead Model: 6935
Implantable Pulse Generator Implant Date: 20210312

## 2019-05-10 NOTE — Progress Notes (Addendum)
CRT-D device/wound check in office. 1 cm in length of tissue that appears to be unapproximated. Granulation tissue noted.  Dr. Johney Frame contacted and orders given for wound care. No swelling noted to site. Patient education provided to wash area with warm soap and water twice daily. Make sure area remains dry and open to air. Thresholds and sensing consistent with previous device measurements. Lead impedance trends stable over time. No mode switch episodes recorded. No ventricular arrhythmia episodes recorded. Patient bi-ventricularly pacing 94.8% of the time. Device programmed with appropriate safety margins. Heart failure diagnostics reviewed and trends are stable for patient. No changes made this session. Estimated longevity 3.8 years.  Patient enrolled in remote follow up. Plan to check device remotely in 3 months and see in office in 6 months. Patient education completed including shock plan.

## 2019-05-10 NOTE — Patient Instructions (Signed)
Wash wound with warm water and mild soap 2 times a day. Call office if you have redness, drainage, or swelling at wound site.Call if you develop chills or a fever. Sen a picture of the wound site to clinic for evaluation on 05/17/19.

## 2019-05-17 ENCOUNTER — Other Ambulatory Visit: Payer: Self-pay | Admitting: Cardiology

## 2019-06-27 ENCOUNTER — Telehealth: Payer: Self-pay

## 2019-06-27 ENCOUNTER — Ambulatory Visit (INDEPENDENT_AMBULATORY_CARE_PROVIDER_SITE_OTHER): Payer: Medicare Other

## 2019-06-27 DIAGNOSIS — I5022 Chronic systolic (congestive) heart failure: Secondary | ICD-10-CM

## 2019-06-27 DIAGNOSIS — Z95810 Presence of automatic (implantable) cardiac defibrillator: Secondary | ICD-10-CM

## 2019-06-27 NOTE — Progress Notes (Signed)
EPIC Encounter for ICM Monitoring  Patient Name: Becky [name] is a 69 y.o. female Date: 06/27/2019 Primary Care Physican: Bedelia Person, MD Primary Cardiologist:Branch Electrophysiologist: Allred Bi-V Pacing:92.4% 04/15/2019 Office Weight: 180lbs(does not weigh at home)   Spoke with patient.  She has not been following a low salt diet and typically eats a lot of restaurant foods.   OptivolThoracic impedancesuggesting possible ongoing fluid accumulation since 06/24/2019.  Prescribed:Furosemide 40 mgtake 1tablet (40 mg total) daily. Take additional 0.5 tablet as needed for swelling.  Labs: 04/26/2019 Creatinine 1.76, BUN 16, Potassium 4.5, Sodium 143, GFR 1.76 A complete set of results can be found in Results Review.  Recommendations: She is taking additional 20 mg of Lasix x 3 days.  Follow-up plan: ICM clinic phone appointment on5/18/2021 (manual send) to recheck fluid levels.  91 day device clinic remote transmission scheduled 08/10/2019.  Office visit with Dr Johney Frame on  08/01/2019.  Copy of ICM check sent to Dr.Allred and Dr Wyline Mood for review and recommendations if needed.  3 month ICM trend: 06/27/2019    1 Year ICM trend:       Karie Soda, RN 06/27/2019 11:34 AM

## 2019-06-27 NOTE — Telephone Encounter (Signed)
Remote ICM transmission received.  Attempted call to patient regarding ICM remote transmission and left detailed message per DPR.   

## 2019-07-05 ENCOUNTER — Ambulatory Visit (INDEPENDENT_AMBULATORY_CARE_PROVIDER_SITE_OTHER): Payer: Medicare Other

## 2019-07-05 DIAGNOSIS — Z95810 Presence of automatic (implantable) cardiac defibrillator: Secondary | ICD-10-CM

## 2019-07-05 DIAGNOSIS — I5022 Chronic systolic (congestive) heart failure: Secondary | ICD-10-CM

## 2019-07-06 ENCOUNTER — Telehealth: Payer: Self-pay

## 2019-07-06 NOTE — Telephone Encounter (Signed)
Left message for patient to remind of missed remote transmission.  

## 2019-07-08 ENCOUNTER — Telehealth: Payer: Self-pay

## 2019-07-08 NOTE — Progress Notes (Signed)
Patient returned call.  She said she is doing fine. She had a lot of urine output and could tell she was getting rid of a lot of fluid.  She is getting PT for back pain right now.  No changes and encouraged to call if experiencing any fluid symptoms.

## 2019-07-08 NOTE — Progress Notes (Signed)
EPIC Encounter for ICM Monitoring  Patient Name: Becky [name] is a 69 y.o. female Date: 07/08/2019 Primary Care Physican: Bedelia Person, MD Primary Cardiologist:Branch Electrophysiologist: Allred Bi-V Pacing:95.5% 04/15/2019 Office Weight: 180lbs(does not weigh at home)  Time in AT/AF 0.0 hr/day (0.0%)   Attempted call to patient and unable to reach.  Left detailed message per DPR regarding transmission. Transmission reviewed.   OptivolThoracic impedancereturned to baseline normal since 06/27/19 remote transmission.  Prescribed:Furosemide 40 mgtake 1tablet (40 mg total) daily. Take additional 0.5 tablet as needed for swelling.  Labs: 04/26/2019 Creatinine 1.76, BUN 16, Potassium 4.5, Sodium 143, GFR 1.76 A complete set of results can be found in Results Review.  Recommendations:Left voice mail with ICM number and encouraged to call if experiencing any fluid symptoms.  Follow-up plan: ICM clinic phone appointment on6/10/2019.  91 day device clinic remote transmission scheduled 08/10/2019.  Office visit with Dr Johney Frame on  08/01/2019.  Copy of ICM check sent to Dr.Allred.  3 month ICM trend: 07/07/2019    1 Year ICM trend:       Karie Soda, RN 07/08/2019 9:24 AM

## 2019-07-08 NOTE — Telephone Encounter (Signed)
Remote ICM transmission received.  Attempted call to patient regarding ICM remote transmission and left detailed message per DPR.  Advised to return call for any fluid symptoms or questions. Next ICM remote transmission scheduled 07/27/2019.

## 2019-07-27 ENCOUNTER — Ambulatory Visit (INDEPENDENT_AMBULATORY_CARE_PROVIDER_SITE_OTHER): Payer: Medicare Other

## 2019-07-27 DIAGNOSIS — I5022 Chronic systolic (congestive) heart failure: Secondary | ICD-10-CM

## 2019-07-27 DIAGNOSIS — Z95810 Presence of automatic (implantable) cardiac defibrillator: Secondary | ICD-10-CM | POA: Diagnosis not present

## 2019-07-29 ENCOUNTER — Telehealth: Payer: Self-pay

## 2019-07-29 NOTE — Telephone Encounter (Signed)
Remote ICM transmission received.  Attempted call to patient regarding ICM remote transmission and left detailed message per DPR to return call.  Advised to return call for any fluid symptoms or questions. Next ICM remote transmission scheduled 08/01/2019.    

## 2019-07-29 NOTE — Telephone Encounter (Signed)
Spoke with pt regarding appt on 08/01/19. Pt stated she didn't have any questions at this time and confirmed virtual visit.

## 2019-07-29 NOTE — Progress Notes (Signed)
EPIC Encounter for ICM Monitoring  Patient Name: Becky [name] is a 69 y.o. female Date: 07/29/2019 Primary Care Physican: Bedelia Person, MD Primary Cardiologist:Branch Electrophysiologist: Allred Bi-V Pacing:93.4% 04/15/2019 OfficeWeight: 180lbs(does not weigh at home)  Time in AT/AF  0.0 hr/day (0.0%)   Attempted call to patient and unable to reach.  Left detailed message per DPR regarding transmission. Transmission reviewed.   OptivolThoracic impedancesuggesting possible fluid accumulation since 07/09/2019.  Prescribed:Furosemide 40 mgtake 1tablet (40 mg total) daily. Take additional 0.5 tablet as needed for swelling.  Labs: 04/26/2019 Creatinine1.76, BUN16, Potassium4.5, Sodium143, GFR1.76 A complete set of results can be found in Results Review.  Recommendations: Left voice mail with ICM number and encouraged to call if experiencing any fluid symptoms.  Follow-up plan: ICM clinic phone appointment on6/14/2021 (manual send) to recheck fluid levels.91 day device clinic remote transmission scheduled 08/10/2019. Virtual visit with Dr Johney Frame on 08/01/2019.  Copy of ICM check sent to Dr.Allred and Dr Wyline Mood for review and recommendations if needed.  3 month ICM trend: 07/27/2019    1 Year ICM trend:       Karie Soda, RN 07/29/2019 9:01 AM

## 2019-07-29 NOTE — Progress Notes (Signed)
Returned patient call as requested by voice mail message. She reports feeling tired lately but breathing is fine.  She is unsure why she may have fluid but will look at her diet to make sure she is not getting too much salt.  She will take extra Furosemide for a few days and send manual transmission on Monday to review before virtual visit with Dr Johney Frame.

## 2019-08-01 ENCOUNTER — Ambulatory Visit (INDEPENDENT_AMBULATORY_CARE_PROVIDER_SITE_OTHER): Payer: Medicare Other

## 2019-08-01 ENCOUNTER — Other Ambulatory Visit: Payer: Self-pay

## 2019-08-01 ENCOUNTER — Telehealth (INDEPENDENT_AMBULATORY_CARE_PROVIDER_SITE_OTHER): Payer: Medicare Other | Admitting: Internal Medicine

## 2019-08-01 DIAGNOSIS — I5022 Chronic systolic (congestive) heart failure: Secondary | ICD-10-CM

## 2019-08-01 DIAGNOSIS — G4733 Obstructive sleep apnea (adult) (pediatric): Secondary | ICD-10-CM

## 2019-08-01 DIAGNOSIS — Z95810 Presence of automatic (implantable) cardiac defibrillator: Secondary | ICD-10-CM

## 2019-08-01 DIAGNOSIS — I255 Ischemic cardiomyopathy: Secondary | ICD-10-CM

## 2019-08-01 NOTE — Progress Notes (Signed)
EPIC Encounter for ICM Monitoring  Patient Name: Becky [name] is a 69 y.o. female Date: 08/01/2019 Primary Care Physican: Bedelia Person, MD Primary Cardiologist:Branch Electrophysiologist: Allred Bi-V Pacing:95.9% 04/15/2019 OfficeWeight: 180lbs(does not weigh at home)  Time in AT/AF0.0 hr/day (0.0%)   Spoke with patient.  She is feeling better than she did last week after taking 3 days of extra Lasix.    OptivolThoracic impedanceimproving but continues to suggest possible fluid accumulation since 07/09/2019.  Prescribed:Furosemide 40 mgtake 1tablet (40 mg total) daily. Take additional 0.5 tablet as needed for swelling.  Labs: 04/26/2019 Creatinine1.76, BUN16, Potassium4.5, Sodium143, GFR1.76 A complete set of results can be found in Results Review.  Recommendations: Advised patient to take extra 0.5 tablet of Lasix as prescribed x 1 more day and to discuss with Dr Johney Frame today at virtual visit if needed   Follow-up plan: ICM clinic phone appointment on6/21/2021 to recheck fluid levels.91 day device clinic remote transmission scheduled 08/10/2019.   Virtual visit with Dr Johney Frame on 08/01/2019.Office visit with Dr Wyline Mood on 08/26/2019.  Copy of ICM check sent to Dr.Allred and Dr Wyline Mood for review and recommendations if needed.  3 month ICM trend: 08/01/2019    1 Year ICM trend:       Karie Soda, RN 08/01/2019 11:21 AM

## 2019-08-01 NOTE — Progress Notes (Signed)
Electrophysiology TeleHealth Note  Due to national recommendations of social distancing due to COVID 19, an audio telehealth visit is felt to be most appropriate for this patient at this time.  Verbal consent was obtained by me for the telehealth visit today.  The patient does not have capability for a virtual visit.  A phone visit is therefore required today.   Date:  08/01/2019   ID:  Becky [name], DOB 1950-10-10, MRN [id number]  Location: patient's home  Provider location:  [place]  Evaluation Performed: Follow-up visit  PCP:  Bedelia Person, MD   Electrophysiologist:  Dr Johney Frame  Chief Complaint:  palpitations  History of Present Illness:    Becky [name] is a 69 y.o. female who presents via telehealth conferencing today.  Since her recent generator change, the patient reports doing very well. Her ICD pocket healed without difficulty.  Today, she denies symptoms of palpitations, chest pain, shortness of breath,  lower extremity edema, dizziness, presyncope, or syncope.  The patient is otherwise without complaint today.     Past Medical History:  Diagnosis Date   AICD (automatic cardioverter/defibrillator) present    Anxiety    Arthritis    "all my joints; neck, back, legs, arms, elbows"  (10/03/2015)   Asthma    CAD (coronary artery disease)    a. 5/08: s/p DES to PDA and DES to LAD; b. 1/09: s/p DES to pLAD; c. 1/11: s/p DES to LAD x 2, d. LHC (3/12 in [place] - EF 55%, mild ant HK, nl LM, LAD stents ok, oLAD 30, RCA stents ok;  e.  4/13:  s/p DES to RCA, f. 1/14: dLM 20-30, mOM3 20-30, mLAD 80 ISR => s/p 2.5x16 mm Promus Element DES; EF 45-50%    CHF (congestive heart failure) (HCC)    Chronic bronchitis (HCC)    Chronic lower back pain    "into pelvis region" (10/03/2015)   COPD (chronic obstructive pulmonary disease) (HCC)    Daily headache    "might be from sinus" (10/03/2015)   Depression    hx (10/03/2015)   Fibromyalgia    GERD  (gastroesophageal reflux disease)    Heart murmur    HLD (hyperlipidemia)    Hypertension    Ischemic cardiomyopathy    a. Echo (11/14):  EF 25%    LBBB (left bundle branch block)    Myocardial infarction (HCC) 12/2012   OSA on CPAP    Pneumonia 1950s   Syncope 11/14   LifeVest placed   Upper back pain, chronic    "into my shoulders; fibromyalgia works here" (10/03/2015)    Past Surgical History:  Procedure Laterality Date   BI-VENTRICULAR IMPLANTABLE CARDIOVERTER DEFIBRILLATOR N/A 06/02/2013   Procedure: BI-VENTRICULAR IMPLANTABLE CARDIOVERTER DEFIBRILLATOR  (CRT-D);  Surgeon: Gardiner Rhyme, MD;  Location: [facility] CATH LAB;  Service: Cardiovascular;  Laterality: N/A;   BI-VENTRICULAR IMPLANTABLE CARDIOVERTER DEFIBRILLATOR  (CRT-D)  06/02/2013   MDT VivaQuad CRTD implanted by Dr Johney Frame for ICM, CHF   BIV ICD GENERATOR CHANGEOUT N/A 04/29/2019   Procedure: BIV ICD GENERATOR CHANGEOUT;  Surgeon: Hillis Range, MD;  Location: [facility] INVASIVE CV LAB;  Service: Cardiovascular;  Laterality: N/A;   CARDIAC CATHETERIZATION  12/2012   S/P MI   CORONARY ANGIOPLASTY WITH STENT PLACEMENT  2007; 2009; 2011   "?2 +1 +1 "   CORONARY ANGIOPLASTY WITH STENT PLACEMENT  02/2012   FRACTURE SURGERY     HYSTEROSCOPY W/ ENDOMETRIAL ABLATION  ~ 1998  LEAD REVISION  11-08-13   RA and LV lead revision by Dr Johney Frame   LEAD REVISION Left 11/08/2013   Procedure: LEAD REVISION;  Surgeon: Gardiner Rhyme, MD;  Location: [facility] CATH LAB;  Service: Cardiovascular;  Laterality: Left;   LEFT HEART CATHETERIZATION WITH CORONARY ANGIOGRAM N/A 01/03/2013   Procedure: LEFT HEART CATHETERIZATION WITH CORONARY ANGIOGRAM;  Surgeon: Kathleene Hazel, MD;  Location: [facility] CATH LAB;  Service: Cardiovascular;  Laterality: N/A;   ORIF ANKLE FRACTURE Right 10/21/2009   TUBAL LIGATION  1989    Current Outpatient Medications  Medication Sig Dispense Refill   acetaminophen-codeine (TYLENOL #3) 300-30 MG tablet Take 1  tablet by mouth every 6 (six) hours as needed for moderate pain.   2   aspirin EC 81 MG tablet Take 1 tablet (81 mg total) by mouth daily.     Aspirin-Salicylamide-Caffeine (BC HEADACHE PO) Take 1 packet by mouth daily as needed (pain.).      cetirizine (ZYRTEC) 10 MG tablet Take 10 mg by mouth at bedtime.   4   clopidogrel (PLAVIX) 75 MG tablet Take 75 mg by mouth daily.      escitalopram (LEXAPRO) 20 MG tablet Take 20 mg by mouth daily.      fluticasone (FLONASE) 50 MCG/ACT nasal spray Place 2 sprays into both nostrils at bedtime.   7   formoterol (PERFOROMIST) 20 MCG/2ML nebulizer solution Take 20 mcg by nebulization 2 (two) times daily.     furosemide (LASIX) 40 MG tablet TAKE 1 TABLET DAILY MAY TAKE ADDITIONAL 1/2 TABLET AS NEEDED FOR SWELLING (Patient taking differently: Take 20-40 mg by mouth See admin instructions. Take 1 tablet (40 mg) by mouth scheduled in the morning, may take an additional 0.5 tablet (20 mg) if needed for swelling.) 90 tablet 1   gabapentin (NEURONTIN) 300 MG capsule Take 300 mg by mouth 3 (three) times daily.      ipratropium-albuterol (DUONEB) 0.5-2.5 (3) MG/3ML SOLN Take 3 mLs by nebulization every 6 (six) hours as needed (shortness of breath).     lactulose (CHRONULAC) 10 GM/15ML solution Take 10 g by mouth 2 (two) times daily as needed for mild constipation.     magnesium oxide (MAG-OX) 400 MG tablet Take 400 mg by mouth daily.     metFORMIN (GLUCOPHAGE) 500 MG tablet Take 1,000 mg by mouth 2 (two) times daily with a meal.      metoprolol succinate (TOPROL-XL) 50 MG 24 hr tablet TAKE 1 TABLET BY MOUTH DAILY WITH OR IMMEDIATELY FOLLOWING A MEAL(DOSE DECREASE) 90 tablet 3   montelukast (SINGULAIR) 10 MG tablet Take 10 mg by mouth at bedtime.      nitroGLYCERIN (NITROSTAT) 0.4 MG SL tablet Place 1 tablet (0.4 mg total) under the tongue every 5 (five) minutes x 3 doses as needed. If no relief after 3rd dose, proceed to ED 25 tablet 5   pantoprazole  (PROTONIX) 40 MG tablet Take 1 tablet (40 mg total) by mouth daily. 90 tablet 2   pravastatin (PRAVACHOL) 80 MG tablet TAKE 1 TABLET(80 MG) BY MOUTH EVERY EVENING 90 tablet 1   rOPINIRole (REQUIP) 0.5 MG tablet Take 1 mg by mouth at bedtime.      spironolactone (ALDACTONE) 25 MG tablet Take 0.5 tablets (12.5 mg total) by mouth daily. 45 tablet 3   VENTOLIN HFA 108 (90 Base) MCG/ACT inhaler Inhale 1-2 puffs into the lungs every 6 (six) hours as needed for wheezing or shortness of breath.      No  current facility-administered medications for this visit.    Allergies:   Tape, Lipitor [atorvastatin], Prozac [fluoxetine hcl], Codeine, Fosamax [alendronate sodium], and Keflex [cephalexin]   Social History:  The patient  reports that she has been smoking cigarettes. She started smoking about 28 years ago. She has a 10.00 pack-year smoking history. She has never used smokeless tobacco. She reports current alcohol use. She reports that she does not use drugs.   ROS:  Please see the history of present illness.   All other systems are personally reviewed and negative.    Exam:    Vital Signs:  There were no vitals taken for this visit.  Well sounding, alert and conversant   Labs/Other Tests and Data Reviewed:    Recent Labs: 04/26/2019: BUN 16; Creatinine, Ser 1.76; Hemoglobin 13.6; Platelets 261; Potassium 4.5; Sodium 143   Wt Readings from Last 3 Encounters:  04/29/19 170 lb (77.1 kg)  04/15/19 180 lb (81.6 kg)  03/15/19 184 lb (83.5 kg)    ASSESSMENT & PLAN:    1.  Ischemic CM/ CAD/ chronic systolic dysfunction/ LBBB Doing well EF has recovered previously with CRT Her LV threshold is elevated chronically and she has relatively low (3.5 year) projected battery. I will obtain remote today.  We will plan to bring her in to the device clinic to check vectors and see if we can get by with a lower output.  2. OSA Compliance with CPAP is advised   Risks, benefits and potential  toxicities for medications prescribed and/or refilled reviewed with patient today.   Follow-up:  6 months with me in [place]   Patient Risk:  after full review of this patients clinical status, I feel that they are at moderate risk at this time.  Today, I have spent 15 minutes with the patient with telehealth technology discussing arrhythmia management .    SignedThompson Grayer, MD  08/01/2019 3:11 PM     [place] [address] [phone] (office) [phone] (fax)

## 2019-08-04 ENCOUNTER — Telehealth: Payer: Self-pay | Admitting: *Deleted

## 2019-08-04 NOTE — Telephone Encounter (Signed)
LMOVM (DPR) advising that Dr. Johney Frame recommended DC appointment [facility] or [facility]) for device reprogramming. Need to reduce LV output/safety margin for improved battery longevity.  number provided for call back this morning, DC number provided for call back this afternoon.  If pt is interested in [place] DC appointment on 08/12/19, please offer a 10:15am or 10:30am appointment.

## 2019-08-04 NOTE — Telephone Encounter (Signed)
Patient returned call. Pt agreeable to a DC appointment in [place] on 08/12/19 at 10:15am. Pt denies questions at this time.

## 2019-08-08 ENCOUNTER — Ambulatory Visit (INDEPENDENT_AMBULATORY_CARE_PROVIDER_SITE_OTHER): Payer: Medicare Other

## 2019-08-08 ENCOUNTER — Telehealth: Payer: Self-pay

## 2019-08-08 DIAGNOSIS — I5022 Chronic systolic (congestive) heart failure: Secondary | ICD-10-CM

## 2019-08-08 DIAGNOSIS — Z95810 Presence of automatic (implantable) cardiac defibrillator: Secondary | ICD-10-CM

## 2019-08-08 NOTE — Progress Notes (Signed)
EPIC Encounter for ICM Monitoring  Patient Name: Becky [name] is a 69 y.o. female Date: 08/08/2019 Primary Care Physican: Bedelia Person, MD Primary Cardiologist:Branch Electrophysiologist: Allred Bi-V Pacing:96.1% 04/15/2019 OfficeWeight: 180lbs(does not weigh at home)  Time in AT/AF0.0 hr/day (0.0%)   Spoke with patient. She reports she has some swelling and pain in left hand. Denies any breathing difficulties and does not weigh at home.   OptivolThoracic impedanceimproving but continues to suggest possible fluid accumulation since 07/09/2019 even after taking extra Furosemide 40 mg daily x 2 weeks.   Prescribed:Furosemide 40 mgtake 1tablet (40 mg total) daily. Take additional 0.5 tablet as needed for swelling.  Labs: 04/26/2019 Creatinine1.76, BUN16, Potassium4.5, Sodium143, GFR1.76 A complete set of results can be found in Results Review.  Recommendations:Patient has appoint to reprogram device scheduled 6/25 in [employer] office.  Advised will send phone note of report to Dr Wyline Mood for review and recommendations.  Follow-up plan: ICM clinic phone appointment on6/28/2021 (Manual send) to recheck fluid levels.91 day device clinic remote transmission scheduled 08/10/2019.   Next office visit due: 08/26/19 with Dr Wyline Mood.  Copy of ICM check sent to Dr.Allredand phone note to Dr Wyline Mood.  3 month ICM trend: 08/08/2019    1 Year ICM trend:       Karie Soda, RN 08/08/2019 12:50 PM

## 2019-08-08 NOTE — Telephone Encounter (Signed)
ICM call to patient.  She has been taking extra 40 mg of Furosemide daily for past 2 weeks due to possible fluid accumulation.  She reports hand swelling and pain in left hand. Denies any breathing difficulties and does not weigh at home.   OptivolThoracic impedanceimproving since taking Furosemide 40 mg twice a day x 2 weeks (she should have only been taking extra 20 mg when needed) but continues to suggest possible fluid accumulation since 07/09/2019  Prescribed:Furosemide 40 mgtake 1tablet (40 mg total) daily. Take additional 0.5 tablet as needed for swelling.  Last Labs: 04/26/2019 Creatinine1.76, BUN16, Potassium4.5, [id number], GFR1.76  Routed to Dr Wyline Mood for review and recommendations regarding if any recommendations needed regarding meds, labs or should patient take Furosemide 40 mg daily until she is seen in the office on 7/9 OV scheduled with Dr Wyline Mood.     Pt has office visit with Dr Wyline Mood on 7/9 and scheduled to have device reprogrammed in [place] office 6/25.  Recheck device report on6/21/2021 to recheck fluid levels.  3 month ICM trend: 08/08/2019    1 Year ICM trend:

## 2019-08-09 NOTE — Telephone Encounter (Signed)
Can we check a BMET/Mg since she has been on extra diuretic. I would take lasix 40mg  daily may take additional 20mg  as needed for swelling or SOB, we will reevaluate at our f/u in July  J Miachel Nardelli MD

## 2019-08-09 NOTE — Telephone Encounter (Signed)
Spoke with patient.  Advised of Dr Verna Czech recommendations to have BMET/Md drawn since she has been taking extra diuretic.  Advised for future for her to take the prescribed dosage of lasix 40mg  daily and may take additional 20mg  as needed for swelling or SOB.  Advised he will reevaluate at the July office visit appointment.  She agreed to have labs drawn at [facility]  Advised will fax the order but she will need to call the lab for an appointment within the next 2 days.  She asked for lab results be sent to her PCP, Dr August as well.

## 2019-08-09 NOTE — Progress Notes (Signed)
Spoke with patient.  Advised of Dr Verna Czech recommendations to have BMET/Md drawn since she has been taking extra diuretic.  Advised for future for her to take the prescribed dosage of lasix 40mg  daily and may take additional 20mg  as needed for swelling or SOB.  Advised he will reevaluate at the July office visit appointment.  She agreed to have labs drawn at [facility].  Advised will fax the order but she will need to call the lab for an appointment within the next 2 days.  She asked for lab results be sent to her PCP, Dr August as well.  Patient is aware of [place] Device clinic appointment on 08/12/2019. Lab orders placed (see phone note for associated orders).  Call to [facility] for fax information.

## 2019-08-09 NOTE — Progress Notes (Signed)
Antoine Poche, MD  Physician  Cardiology  Telephone Encounter  Signed  Creation Time:  08/09/2019 10:44 AM          Signed        Can we check a BMET/Mg since she has been on extra diuretic. I would take lasix 40mg  daily may take additional 20mg  as needed for swelling or SOB, we will reevaluate at our f/u in July  J Branch MD

## 2019-08-10 ENCOUNTER — Ambulatory Visit (INDEPENDENT_AMBULATORY_CARE_PROVIDER_SITE_OTHER): Payer: Medicare Other | Admitting: *Deleted

## 2019-08-10 DIAGNOSIS — I255 Ischemic cardiomyopathy: Secondary | ICD-10-CM

## 2019-08-10 LAB — CUP PACEART REMOTE DEVICE CHECK
Battery Remaining Longevity: 61 mo
Battery Voltage: 2.99 V
Brady Statistic AP VP Percent: 78.64 %
Brady Statistic AP VS Percent: 2.54 %
Brady Statistic AS VP Percent: 17.91 %
Brady Statistic AS VS Percent: 0.92 %
Brady Statistic RA Percent Paced: 78.57 %
Brady Statistic RV Percent Paced: 0.75 %
Date Time Interrogation Session: 20210623022821
HighPow Impedance: 71 Ohm
Implantable Lead Implant Date: 20150416
Implantable Lead Implant Date: 20150922
Implantable Lead Implant Date: 20150922
Implantable Lead Location: 753858
Implantable Lead Location: 753859
Implantable Lead Location: 753860
Implantable Lead Model: 4598
Implantable Lead Model: 5076
Implantable Lead Model: 6935
Implantable Pulse Generator Implant Date: 20210312
Lead Channel Impedance Value: 151.406
Lead Channel Impedance Value: 161.5 Ohm
Lead Channel Impedance Value: 162.857
Lead Channel Impedance Value: 174.595
Lead Channel Impedance Value: 174.595
Lead Channel Impedance Value: 285 Ohm
Lead Channel Impedance Value: 285 Ohm
Lead Channel Impedance Value: 323 Ohm
Lead Channel Impedance Value: 323 Ohm
Lead Channel Impedance Value: 342 Ohm
Lead Channel Impedance Value: 380 Ohm
Lead Channel Impedance Value: 380 Ohm
Lead Channel Impedance Value: 380 Ohm
Lead Channel Impedance Value: 513 Ohm
Lead Channel Impedance Value: 570 Ohm
Lead Channel Impedance Value: 608 Ohm
Lead Channel Impedance Value: 646 Ohm
Lead Channel Impedance Value: 665 Ohm
Lead Channel Pacing Threshold Amplitude: 0.625 V
Lead Channel Pacing Threshold Amplitude: 1 V
Lead Channel Pacing Threshold Amplitude: 1.125 V
Lead Channel Pacing Threshold Pulse Width: 0.4 ms
Lead Channel Pacing Threshold Pulse Width: 0.4 ms
Lead Channel Pacing Threshold Pulse Width: 0.4 ms
Lead Channel Sensing Intrinsic Amplitude: 1 mV
Lead Channel Sensing Intrinsic Amplitude: 1 mV
Lead Channel Sensing Intrinsic Amplitude: 7.125 mV
Lead Channel Sensing Intrinsic Amplitude: 7.125 mV
Lead Channel Setting Pacing Amplitude: 1.5 V
Lead Channel Setting Pacing Amplitude: 2 V
Lead Channel Setting Pacing Amplitude: 4.25 V
Lead Channel Setting Pacing Pulse Width: 0.4 ms
Lead Channel Setting Pacing Pulse Width: 0.4 ms
Lead Channel Setting Sensing Sensitivity: 0.3 mV

## 2019-08-11 ENCOUNTER — Telehealth: Payer: Self-pay | Admitting: *Deleted

## 2019-08-11 NOTE — Telephone Encounter (Signed)
Pt voiced understanding

## 2019-08-11 NOTE — Progress Notes (Signed)
Remote ICD transmission.   

## 2019-08-11 NOTE — Telephone Encounter (Signed)
-----   Message from Antoine Poche, MD sent at 08/11/2019  1:53 PM EDT ----- Labs show mild stress on the kidneys though somewhat better from function in the past, likely from the recent extra diuretic. Would continue the 40mg  daily, can take additional 20mg  as needed .  MD

## 2019-08-12 ENCOUNTER — Ambulatory Visit (INDEPENDENT_AMBULATORY_CARE_PROVIDER_SITE_OTHER): Payer: Medicare Other | Admitting: *Deleted

## 2019-08-12 ENCOUNTER — Other Ambulatory Visit: Payer: Self-pay

## 2019-08-12 DIAGNOSIS — I5042 Chronic combined systolic (congestive) and diastolic (congestive) heart failure: Secondary | ICD-10-CM

## 2019-08-12 LAB — CUP PACEART INCLINIC DEVICE CHECK
Battery Remaining Longevity: 97 mo
Battery Voltage: 2.98 V
Brady Statistic AP VP Percent: 78.43 %
Brady Statistic AP VS Percent: 1.75 %
Brady Statistic AS VP Percent: 18.6 %
Brady Statistic AS VS Percent: 1.22 %
Brady Statistic RA Percent Paced: 77.43 %
Brady Statistic RV Percent Paced: 2.82 %
Date Time Interrogation Session: 20210625102337
HighPow Impedance: 69 Ohm
Implantable Lead Implant Date: 20150416
Implantable Lead Implant Date: 20150922
Implantable Lead Implant Date: 20150922
Implantable Lead Location: 753858
Implantable Lead Location: 753859
Implantable Lead Location: 753860
Implantable Lead Model: 4598
Implantable Lead Model: 5076
Implantable Lead Model: 6935
Implantable Pulse Generator Implant Date: 20210312
Lead Channel Impedance Value: 161.5 Ohm
Lead Channel Impedance Value: 166.114
Lead Channel Impedance Value: 166.114
Lead Channel Impedance Value: 166.114
Lead Channel Impedance Value: 171 Ohm
Lead Channel Impedance Value: 285 Ohm
Lead Channel Impedance Value: 323 Ohm
Lead Channel Impedance Value: 323 Ohm
Lead Channel Impedance Value: 342 Ohm
Lead Channel Impedance Value: 342 Ohm
Lead Channel Impedance Value: 342 Ohm
Lead Channel Impedance Value: 399 Ohm
Lead Channel Impedance Value: 456 Ohm
Lead Channel Impedance Value: 513 Ohm
Lead Channel Impedance Value: 551 Ohm
Lead Channel Impedance Value: 570 Ohm
Lead Channel Impedance Value: 608 Ohm
Lead Channel Impedance Value: 608 Ohm
Lead Channel Pacing Threshold Amplitude: 0.75 V
Lead Channel Pacing Threshold Amplitude: 0.875 V
Lead Channel Pacing Threshold Amplitude: 1 V
Lead Channel Pacing Threshold Pulse Width: 0.4 ms
Lead Channel Pacing Threshold Pulse Width: 0.4 ms
Lead Channel Pacing Threshold Pulse Width: 0.4 ms
Lead Channel Sensing Intrinsic Amplitude: 0.875 mV
Lead Channel Sensing Intrinsic Amplitude: 2.75 mV
Lead Channel Sensing Intrinsic Amplitude: 7.875 mV
Lead Channel Sensing Intrinsic Amplitude: 8.375 mV
Lead Channel Setting Pacing Amplitude: 1.5 V
Lead Channel Setting Pacing Amplitude: 2 V
Lead Channel Setting Pacing Amplitude: 2.5 V
Lead Channel Setting Pacing Pulse Width: 0.4 ms
Lead Channel Setting Pacing Pulse Width: 0.4 ms
Lead Channel Setting Sensing Sensitivity: 0.3 mV

## 2019-08-12 NOTE — Progress Notes (Signed)
Device check in clinic. See scanned report.

## 2019-08-19 ENCOUNTER — Other Ambulatory Visit: Payer: Self-pay | Admitting: Cardiology

## 2019-08-19 NOTE — Progress Notes (Signed)
No ICM remote transmission received for 08/15/2019 and next ICM transmission scheduled for 08/24/2019.

## 2019-08-24 ENCOUNTER — Ambulatory Visit (INDEPENDENT_AMBULATORY_CARE_PROVIDER_SITE_OTHER): Payer: Medicare Other

## 2019-08-24 DIAGNOSIS — I5042 Chronic combined systolic (congestive) and diastolic (congestive) heart failure: Secondary | ICD-10-CM

## 2019-08-24 DIAGNOSIS — Z95810 Presence of automatic (implantable) cardiac defibrillator: Secondary | ICD-10-CM

## 2019-08-24 NOTE — Progress Notes (Signed)
EPIC Encounter for ICM Monitoring  Patient Name: LEILYNN PILAT is a 69 y.o. female Date: 08/24/2019 Primary Care Physican: Bedelia Person, MD Primary Cardiologist:Branch Electrophysiologist: Allred Bi-V Pacing:88.5% 04/15/2019 OfficeWeight: 180lbs(does not weigh at home)  Time in AT/AF0.0 hr/day (0.0%)   Spoke with patient.  She ate ham for the 4th of July holiday which may have caused the accumulation of fluid again.    OptivolThoracic impedancereturned to normal on 08/15/2019 but suggesting possible fluid accumulation starting again on 08/20/19 but trending back toward baseline.  Impedance also suggested possible fluid accumulation since 07/09/2019 - 08/15/19.   Prescribed:Furosemide 40 mg Take 1 tablet (40 mg) by mouth scheduled in the morning, may take an additional 0.5 tablet (20 mg) if needed for swelling.  Labs: 04/26/2019 Creatinine1.76, BUN16, Potassium4.5, Sodium143, GFR1.76 A complete set of results can be found in Results Review.  Recommendations:  Advised to take the extra 20 mg Furosemide today and tomorrow and advised to limit salt intake.   Follow-up plan: ICM clinic phone appointment on7/26/2021.91 day device clinic remote transmission scheduled 11/09/2019.   EP/Cardiology Office Visits: 08/26/2019 with Dr. Wyline Mood.    Copy of ICM check sent to Dr. Johney Frame.   3 month ICM trend: 08/24/2019    1 Year ICM trend:       Karie Soda, RN 08/24/2019 1:00 PM

## 2019-08-26 ENCOUNTER — Encounter: Payer: Self-pay | Admitting: *Deleted

## 2019-08-26 ENCOUNTER — Ambulatory Visit (INDEPENDENT_AMBULATORY_CARE_PROVIDER_SITE_OTHER): Payer: Medicare Other | Admitting: Cardiology

## 2019-08-26 ENCOUNTER — Other Ambulatory Visit: Payer: Self-pay

## 2019-08-26 ENCOUNTER — Encounter: Payer: Self-pay | Admitting: Cardiology

## 2019-08-26 VITALS — BP 176/78 | HR 71 | Ht 60.0 in | Wt 189.0 lb

## 2019-08-26 DIAGNOSIS — I5022 Chronic systolic (congestive) heart failure: Secondary | ICD-10-CM | POA: Diagnosis not present

## 2019-08-26 DIAGNOSIS — I255 Ischemic cardiomyopathy: Secondary | ICD-10-CM

## 2019-08-26 DIAGNOSIS — I2510 Atherosclerotic heart disease of native coronary artery without angina pectoris: Secondary | ICD-10-CM | POA: Diagnosis not present

## 2019-08-26 DIAGNOSIS — I10 Essential (primary) hypertension: Secondary | ICD-10-CM | POA: Diagnosis not present

## 2019-08-26 DIAGNOSIS — E782 Mixed hyperlipidemia: Secondary | ICD-10-CM | POA: Diagnosis not present

## 2019-08-26 NOTE — Progress Notes (Signed)
Clinical Summary Ms. Seyer is a 69 y.o.female seen today for follow up of the following medical problems.  1. ICM/Chronic systolic heart failure - history of multiple PCIs as described below, mainly in [place].  - 04/2013 LVEF 30-35%. Repeat echo after BiV AICD 05/2014 shows LVEF 20%, grade I diastolic dysfunction.  - 09/2015 echo LVEF 55-60%, no WMAs, grade I diastolic dysfunction - 23/3435 echo [place]: LVEF 65-70%, moderate LVH.  - medical therapy has been limited by orthostatic symtpoms. Not on ACE or ARB -she has been on long term plavix due to her extensive stent burden  04/2019 echo LVEF 55-60%   - some LE edema at times. Some SOB at times - compliant with meds - no recent chest pains - optivol readings have been up and down over the last few months, has taken some additional lasix a ttimes. Now on lasix 51m daily, will take additional 254mas needed     2. OSA  - compliant with cpap, [place] compliant with CPAP   3. Hyperlipidemia - muscle aches on multiple statins, has tolerated pravastatin -pcp follows labs   4. HTN -compliant with meds - checked bp this AM before taking meds  - has not taken meds yet - home bp's 130s/60s  5. COPD - [place] Dr BoLuciana Axe Past Medical History:  Diagnosis Date  . AICD (automatic cardioverter/defibrillator) present   . Anxiety   . Arthritis    "all my joints; neck, back, legs, arms, elbows"  (10/03/2015)  . Asthma   . CAD (coronary artery disease)    a. 5/08: s/p DES to PDA and DES to LAD; b. 1/09: s/p DES to pLAD; c. 1/11: s/p DES to LAD x 2, d. LHC (3/12 [place] EF 55%, mild ant HK, nl LM, LAD stents ok, oLAD 30, RCA stents ok;  e.  4/13:  s/p DES to RCA, f. 1/14: dLM 20-30, mOM3 20-30, mLAD 80 ISR => s/p 2.5x16 mm Promus Element DES; EF 45-50%   . CHF (congestive heart failure) ([place]  . Chronic  bronchitis (HCL[place]  . Chronic lower back pain    "into pelvis region" (10/03/2015)  . COPD (chronic obstructive pulmonary disease) ([place]  . Daily headache    "might be from sinus" (10/03/2015)  . Depression    hx (10/03/2015)  . Fibromyalgia   . GERD (gastroesophageal reflux disease)   . Heart murmur   . HLD (hyperlipidemia)   . Hypertension   . Ischemic cardiomyopathy    a. Echo (11/14):  EF 25%   . LBBB (left bundle Tyrika Newman block)   . Myocardial infarction (HCNew Salis[place]11/2014  . OSA on CPAP   . Pneumonia 1950s  . Syncope 11/14   LifeVest placed  . Upper back pain, chronic    "into my shoulders; fibromyalgia works here" (10/03/2015)     Allergies  Allergen Reactions  . Tape Rash and Other (See Comments)    Severe Reaction (latex tape): Burned hand, Redness-took days to clear up (03/02/12)  . Lipitor [Atorvastatin] Other (See Comments)    Leg pain  . Prozac [Fluoxetine Hcl] Other (See Comments)    Mean/violent thoughts  . Codeine Nausea And Vomiting  . Fosamax [Alendronate Sodium] Nausea Only and Other (See Comments)    Stomach pain  . Keflex [Cephalexin] Nausea Only     Current Outpatient Medications  Medication Sig Dispense Refill  .  acetaminophen-codeine (TYLENOL #3) 300-30 MG tablet Take 1 tablet by mouth every 6 (six) hours as needed for moderate pain.   2  . aspirin EC 81 MG tablet Take 1 tablet (81 mg total) by mouth daily.    . Aspirin-Salicylamide-Caffeine (BC HEADACHE PO) Take 1 packet by mouth daily as needed (pain.).     Marland Kitchen cetirizine (ZYRTEC) 10 MG tablet Take 10 mg by mouth at bedtime.   4  . clopidogrel (PLAVIX) 75 MG tablet Take 75 mg by mouth daily.     Marland Kitchen escitalopram (LEXAPRO) 20 MG tablet Take 20 mg by mouth daily.     . fluticasone (FLONASE) 50 MCG/ACT nasal spray Place 2 sprays into both nostrils at bedtime.   7  . formoterol (PERFOROMIST) 20 MCG/2ML nebulizer solution Take 20 mcg by nebulization 2 (two) times daily.    . furosemide (LASIX) 40 MG tablet TAKE  1 TABLET DAILY MAY TAKE ADDITIONAL 1/2 TABLET AS NEEDED FOR SWELLING (Patient taking differently: Take 20-40 mg by mouth See admin instructions. Take 1 tablet (40 mg) by mouth scheduled in the morning, may take an additional 0.5 tablet (20 mg) if needed for swelling.) 90 tablet 1  . gabapentin (NEURONTIN) 300 MG capsule Take 300 mg by mouth 3 (three) times daily.     Marland Kitchen ipratropium-albuterol (DUONEB) 0.5-2.5 (3) MG/3ML SOLN Take 3 mLs by nebulization every 6 (six) hours as needed (shortness of breath).    . lactulose (CHRONULAC) 10 GM/15ML solution Take 10 g by mouth 2 (two) times daily as needed for mild constipation.    . magnesium oxide (MAG-OX) 400 MG tablet Take 400 mg by mouth daily.    . metFORMIN (GLUCOPHAGE) 500 MG tablet Take 1,000 mg by mouth 2 (two) times daily with a meal.     . metoprolol succinate (TOPROL-XL) 50 MG 24 hr tablet TAKE 1 TABLET BY MOUTH DAILY WITH OR IMMEDIATELY FOLLOWING A MEAL(DOSE DECREASE) 90 tablet 3  . montelukast (SINGULAIR) 10 MG tablet Take 10 mg by mouth at bedtime.     . nitroGLYCERIN (NITROSTAT) 0.4 MG SL tablet Place 1 tablet (0.4 mg total) under the tongue every 5 (five) minutes x 3 doses as needed. If no relief after 3rd dose, proceed to ED 25 tablet 5  . pantoprazole (PROTONIX) 40 MG tablet Take 1 tablet (40 mg total) by mouth daily. 90 tablet 2  . pravastatin (PRAVACHOL) 80 MG tablet TAKE 1 TABLET(80 MG) BY MOUTH EVERY EVENING 90 tablet 1  . rOPINIRole (REQUIP) 0.5 MG tablet Take 1 mg by mouth at bedtime.     Marland Kitchen spironolactone (ALDACTONE) 25 MG tablet Take 0.5 tablets (12.5 mg total) by mouth daily. 45 tablet 3  . VENTOLIN HFA 108 (90 Base) MCG/ACT inhaler Inhale 1-2 puffs into the lungs every 6 (six) hours as needed for wheezing or shortness of breath.      No current facility-administered medications for this visit.     Past Surgical History:  Procedure Laterality Date  . BI-VENTRICULAR IMPLANTABLE CARDIOVERTER DEFIBRILLATOR N/A 06/02/2013    Procedure: BI-VENTRICULAR IMPLANTABLE CARDIOVERTER DEFIBRILLATOR  (CRT-D);  Surgeon: Coralyn Mark, MD;  Location: [facility] CATH LAB;  Service: Cardiovascular;  Laterality: N/A;  . BI-VENTRICULAR IMPLANTABLE CARDIOVERTER DEFIBRILLATOR  (CRT-D)  06/02/2013   MDT VivaQuad CRTD implanted by Dr Rayann Heman for ICM, CHF  . BIV ICD GENERATOR CHANGEOUT N/A 04/29/2019   Procedure: BIV ICD GENERATOR CHANGEOUT;  Surgeon: Thompson Grayer, MD;  Location: [facility] CV LAB;  Service: Cardiovascular;  Laterality:  N/A;  . CARDIAC CATHETERIZATION  12/2012   S/P MI  . CORONARY ANGIOPLASTY WITH STENT PLACEMENT  2007; 2009; 2011   "?2 +1 +1 "  . CORONARY ANGIOPLASTY WITH STENT PLACEMENT  02/2012  . FRACTURE SURGERY    . HYSTEROSCOPY W/ ENDOMETRIAL ABLATION  ~ 1998  . LEAD REVISION  11-08-13   RA and LV lead revision by Dr Rayann Heman  . LEAD REVISION Left 11/08/2013   Procedure: LEAD REVISION;  Surgeon: Coralyn Mark, MD;  Location[facility] CATH LAB;  Service: Cardiovascular;  Laterality: Left;  . LEFT HEART CATHETERIZATION WITH CORONARY ANGIOGRAM N/A 01/03/2013   Procedure: LEFT HEART CATHETERIZATION WITH CORONARY ANGIOGRAM;  Surgeon: Burnell Blanks, MD;  Location: [facility] CATH LAB;  Service: Cardiovascular;  Laterality: N/A;  . ORIF ANKLE FRACTURE Right 10/21/2009  . TUBAL LIGATION  1989     Allergies  Allergen Reactions  . Tape Rash and Other (See Comments)    Severe Reaction (latex tape): Burned hand, Redness-took days to clear up (03/02/12)  . Lipitor [Atorvastatin] Other (See Comments)    Leg pain  . Prozac [Fluoxetine Hcl] Other (See Comments)    Mean/violent thoughts  . Codeine Nausea And Vomiting  . Fosamax [Alendronate Sodium] Nausea Only and Other (See Comments)    Stomach pain  . Keflex [Cephalexin] Nausea Only      Family History  Problem Relation Age of Onset  . Breast cancer Mother   . Heart failure Brother   . Breast cancer Sister   . Breast cancer Sister   . Uterine cancer Sister      Social  History Ms. Vader reports that she has been smoking cigarettes. She started smoking about 28 years ago. She has a 10.00 pack-year smoking history. She has never used smokeless tobacco. Ms. Fahrney reports current alcohol use.   Review of Systems CONSTITUTIONAL: No weight loss, fever, chills, weakness or fatigue.  HEENT: Eyes: No visual loss, blurred vision, double vision or yellow sclerae.No hearing loss, sneezing, congestion, runny nose or sore throat.  SKIN: No rash or itching.  CARDIOVASCULAR: per hpi RESPIRATORY: No shortness of breath, cough or sputum.  GASTROINTESTINAL: No anorexia, nausea, vomiting or diarrhea. No abdominal pain or blood.  GENITOURINARY: No burning on urination, no polyuria NEUROLOGICAL: No headache, dizziness, syncope, paralysis, ataxia, numbness or tingling in the extremities. No change in bowel or bladder control.  MUSCULOSKELETAL: No muscle, back pain, joint pain or stiffness.  LYMPHATICS: No enlarged nodes. No history of splenectomy.  PSYCHIATRIC: No history of depression or anxiety.  ENDOCRINOLOGIC: No reports of sweating, cold or heat intolerance. No polyuria or polydipsia.  Marland Kitchen   Physical Examination Today's Vitals   08/26/19 0810  BP: (!) 176/78  Pulse: 71  SpO2: 97%  Weight: 189 lb (85.7 kg)  Height: 5' (1.524 m)   Body mass index is 36.91 kg/m.  Gen: resting comfortably, no acute distress HEENT: no scleral icterus, pupils equal round and reactive, no palptable cervical adenopathy,  CV: RRR, no m/r/g, no jvd Resp: Clear to auscultation bilaterally GI: abdomen is soft, non-tender, non-distended, normal bowel sounds, no hepatosplenomegaly MSK: extremities are warm, no edema.  Skin: warm, no rash Neuro:  no focal deficits Psych: appropriate affect   Diagnostic Studies 04/2010 Cath [place], [place]: LVEF 55%, mild anterior hypokinesis. LM normal, LAD with stents in prox and mid portion widely patent, LAD ostial 30%. Diags with luminal irregs.  LCX with luminial irregs. RCA with distal patent stents  Stent cards May 2008  DES to PDA, DES to LAD  Jan 2009 DES to prox LAD  Mar 05 2009 DES to LAD x2  05/2011 DES to RCA  Jan 2014 stent done [place], [place]  03/2010 Echo: LVEF 40%, hypokinesis of the anteroseptum.   01/02/13 Cath Hemodynamic Findings: Central aortic pressure: 147/79  Left ventricular pressure: 130/22/26  Angiographic Findings: Left main: 30% distal stenosis. It appears that the distal left main has a stent that continues into the LAD.  Left Anterior Descending Artery: Large vessel that courses to the apex. The entire proximal and mid LAD is stented. There is mild stent restenosis in the proximal segment. The mid stented segment has diffuse 30% stent restenosis. The distal vessel becomes small in caliber and has mild diffuse plaque. There is a moderate caliber first diagonal Suha Schoenbeck with mild plaque disease.  Circumflex Artery: Moderate caliber non-dominant vessel with three moderate caliber obtuse marginal branches. No obstructive disease.  Right Coronary Artery: Large dominant vessel with 30% proximal stenosis, heavily calcified mid vessel with patent stent in the mid vessel (no significant restenosis). The distal vessel has diffuse 40-50% stenosis. The posterolateral Shanique Aslinger and PDA are moderate caliber, patent vessels with mild plaque disease.  Left Ventricular Angiogram: LVEF=25-30%. Hypokinesis of the antero-apical wall.  Impression:  1. 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 Elevated troponin following syncopal event. No culprit lesions seen.  3. Moderate to severe LV systolic dysfunction.  Recommendations: Continue medical management of CAD. Workup for syncope is underway. Carotid dopplers today. Will check d-dimer as PE is a possibility. She could also have had an arrythmia with LV dysfunction.  Complications: None. The patient tolerated the procedure well.  01/03/13  Echo - LVEF 25-30%, akinesis of the anteroseptal and apical myocardium, grade I diastolic dysfunction,  41/96/22 Carotid US - The vertebral arteries appear patent with antegrade flow. - Findings consistent with1- 39 percent stenosis,high end of scale,involving the right internal carotid artery. - Findings consistent with 40 - 59 percent stenosis, low end of scale,involving the left internal carotid artery. - ICA/CCA ratio. right =1.72. left = 1.52 Other specific details can be found in the table(s) above. Prepared and Electronically Authenticated by 12/13/12 Event monitor No symptoms reported, sinus rhythm with conduction delay, occasional PVCs.  04/2013 Echo Study Conclusions  - Left ventricle: The cavity size was normal. Wall thickness was increased in a pattern of mild LVH with moderate basal septal hypertrophy. Systolic function was moderately to severely reduced. The estimated ejection fraction was in the range of 30% to 35%. There is akinesis to dyskinesis of the mid-distal anteroseptal and apical myocardium. There is akinesis of the distalinferoseptal myocardium. Doppler parameters are consistent with abnormal left ventricular relaxation (grade 1 diastolic dysfunction). - Ventricular septum: Septal motion showed abnormal function and dyssynergy. - Aortic valve: Mildly calcified annulus. Probably trileaflet; mildly calcified leaflets. No significant regurgitation. - Mitral valve: Calcified annulus. Trivial regurgitation. - Left atrium: The atrium was mildly dilated. - Right atrium: Central venous pressure: 33m Hg (est). - Tricuspid valve: Trivial regurgitation. - Pulmonary arteries: PA peak pressure: 255mHg (S). - Pericardium, extracardiac: There was no pericardial effusion. Impressions:  - [place] moderate basal septal hypertrophy, LVEF 30-35% with wall motion abnormalities as outlined, grade 1 diastolic dysfunction. Mild left atrial enlagement.  MAC with trivial mitral regurgitation. MIldlysclerotic aortic valve. Trivial tricuspid regurgitation with normal PASP 20 mmHg. 05/2014 echo Study Conclusions  - Left ventricle: Technically limited study. The cavity size was normal. Wall thickness  was increased in a pattern of moderate LVH. The estimated ejection fraction was 55%. Doppler parameters are consistent with abnormal left ventricular relaxation (grade 1 diastolic dysfunction). - Aortic valve: Sclerosis without stenosis. - Right ventricle: The cavity size was normal. Pacer wire or catheter noted in right ventricle. Systolic function was normal. - Impressions: Since the study in 2015, there is definite improvement in LV function.  Impressions:  - Since the study in 2015, there is definite improvement in LV function.   09/2015 Nuclear Stress  There was no ST segment deviation noted during stress.  No T wave inversion was noted during stress.  Defect 1: There is a medium defect of mild severity present in the basal inferior and mid inferior location. This defect is likely a result of bowel loop attenuation artifact. No significant ischemia identified.  This is a low risk study. No significant ischemia identified.  Nuclear stress EF: 54%. Mid to apical septal wall hypokinesis noted   09/2015 echo Study Conclusions  - Left ventricle: The cavity size was normal. Systolic function was normal. The estimated ejection fraction was in the range of 55% to 60%. Wall motion was normal; there were no regional wall motion abnormalities. Doppler parameters are consistent with abnormal left ventricular relaxation (grade 1 diastolic dysfunction). Doppler parameters are consistent with indeterminate ventricular filling pressure. - Aortic valve: Transvalvular velocity was within the normal range. There was no stenosis. There was no regurgitation. - Mitral valve: Transvalvular velocity was within the  normal range. There was no evidence for stenosis. There was no regurgitation. - Left atrium: The atrium was moderately dilated. - Right ventricle: The cavity size was normal. Wall thickness was normal. Systolic function was normal. - Tricuspid valve: There was no regurgitation.    Assessment and Plan   1. ICM/Chronic systolic heart failure  - previous LVEF 30-35%. After BiV AICD repeat echo with normalized LVEF - medical therapy has been limited due to orthstatic symptoms. ACE-I previously stopped during 09/2015 admission due to hypotension. - indefinite plavix due to large stent burden  Fluid status up and down over the last few months, mild crackles on exam. She will take her additional 52m of lasix over the weekend   2.HTN - elevated but has not taken meds yet, home bp's are at goal -c otninue current meds   3. Hyperlipidemia - difficulty tolerating statins in the past, has tolerated pravastatin - request pcp labs, continue current meds   F/u 4 months   JArnoldo Lenis M.D.

## 2019-08-26 NOTE — Patient Instructions (Signed)
Your physician recommends that you schedule a follow-up appointment in: 4 MONTHS WITH DR BRANCH  Your physician recommends that you continue on your current medications as directed. Please refer to the Current Medication list given to you today.  Thank you for choosing [facility]!!    

## 2019-09-12 ENCOUNTER — Ambulatory Visit (INDEPENDENT_AMBULATORY_CARE_PROVIDER_SITE_OTHER): Payer: Medicare Other

## 2019-09-12 ENCOUNTER — Other Ambulatory Visit: Payer: Self-pay | Admitting: Cardiology

## 2019-09-12 DIAGNOSIS — I5022 Chronic systolic (congestive) heart failure: Secondary | ICD-10-CM | POA: Diagnosis not present

## 2019-09-12 DIAGNOSIS — Z95810 Presence of automatic (implantable) cardiac defibrillator: Secondary | ICD-10-CM | POA: Diagnosis not present

## 2019-09-14 ENCOUNTER — Telehealth: Payer: Self-pay

## 2019-09-14 NOTE — Progress Notes (Signed)
EPIC Encounter for ICM Monitoring  Patient Name: Becky [name] is a 69 y.o. female Date: 09/14/2019 Primary Care Physican: Bedelia Person, MD Primary Cardiologist:Branch Electrophysiologist: Allred Bi-V Pacing:92% 08/26/2019 OfficeWeight: 189lbs(does not weigh at home)  Time in AT/AF0.0 hr/day (0.0%)   Attempted call to patient and unable to reach.  Left detailed message per DPR regarding transmission. Transmission reviewed.   OptivolThoracic impedance normal.  Prescribed:Furosemide 40 mg Take 1 tablet (40 mg) by mouth scheduled in the morning, may take an additional 0.5 tablet (20 mg) if needed for swelling.  Labs: 08/10/2019 Creatinine 1.58, BUN 18, Potassium 3.8, Sodium 136, GFR 33-38 04/26/2019 Creatinine1.76, BUN16, Potassium4.5, Sodium143, [id number] A complete set of results can be found in Results Review.  Recommendations: Left voice mail with ICM number and encouraged to call if experiencing any fluid symptoms..   Follow-up plan: ICM clinic phone appointment on8/30/2021.91 day device clinic remote transmission scheduled 11/09/2019.   EP/Cardiology Office Visits: 12/27/2019 with Dr. Wyline Mood and 01/20/2020 with Dr Johney Frame.    Copy of ICM check sent to Dr. Johney Frame.   3 month ICM trend: 09/12/2019    1 Year ICM trend:       Karie Soda, RN 09/14/2019 10:37 AM

## 2019-09-14 NOTE — Telephone Encounter (Signed)
Remote ICM transmission received.  Attempted call to patient regarding ICM remote transmission and left detailed message per DPR.  Advised to return call for any fluid symptoms or questions. Next ICM remote transmission scheduled 10/17/2019.   ° °

## 2019-09-15 ENCOUNTER — Ambulatory Visit: Payer: Medicare Other | Admitting: Cardiology

## 2019-09-18 ENCOUNTER — Other Ambulatory Visit: Payer: Self-pay | Admitting: Cardiology

## 2019-10-14 ENCOUNTER — Other Ambulatory Visit: Payer: Self-pay | Admitting: Cardiology

## 2019-10-17 ENCOUNTER — Ambulatory Visit (INDEPENDENT_AMBULATORY_CARE_PROVIDER_SITE_OTHER): Payer: Medicare Other

## 2019-10-17 ENCOUNTER — Telehealth: Payer: Self-pay

## 2019-10-17 DIAGNOSIS — Z95810 Presence of automatic (implantable) cardiac defibrillator: Secondary | ICD-10-CM | POA: Diagnosis not present

## 2019-10-17 DIAGNOSIS — I5022 Chronic systolic (congestive) heart failure: Secondary | ICD-10-CM

## 2019-10-17 NOTE — Telephone Encounter (Signed)
Remote ICM transmission received.  Attempted call to patient regarding ICM remote transmission and left detailed message per DPR.  Advised to return call for any fluid symptoms or questions. Next ICM remote transmission scheduled 10/25/2019.   ° ° °

## 2019-10-17 NOTE — Progress Notes (Signed)
EPIC Encounter for ICM Monitoring  Patient Name: RAEJEAN SWINFORD is a 69 y.o. female Date: 10/17/2019 Primary Care Physican: Bedelia Person, MD Primary Cardiologist:Branch Electrophysiologist: Allred Bi-V Pacing:92.2% 08/26/2019 OfficeWeight: 189lbs(does not weigh at home)  Time in AT/AF0.0 hr/day (0.0%)   Attempted call to patient and unable to reach.  Left detailed message per DPR regarding transmission. Transmission reviewed.   OptivolThoracic impedance suggesting possible fluid accumulation since 10/10/2019.  Prescribed:Furosemide 40 mgTake 1 tablet (40 mg) by mouth scheduled in the morning, may take an additional 0.5 tablet (20 mg) if needed for swelling.  Labs: 08/10/2019 Creatinine 1.58, BUN 18, Potassium 3.8, Sodium 136, GFR 33-38 04/26/2019 Creatinine1.76, BUN16, Potassium4.5, Sodium143, [id number] A complete set of results can be found in Results Review.  Recommendations: Left voice mail with ICM number and encouraged to call if experiencing any fluid symptoms.  If patient reached will advise to take additional Furosemide as prescribed.  Follow-up plan: ICM clinic phone appointment on9/08/2019 (manual) to recheck fluid levels.91 day device clinic remote transmission scheduled9/22/2021.   EP/Cardiology Office Visits:12/27/2019 with Dr.Branch and 01/20/2020 with Dr Johney Frame.   Copy of ICM check sent to Dr.Allred and Dr Wyline Mood for review.   3 month ICM trend: 10/17/2019    1 Year ICM trend:       Karie Soda, RN 10/17/2019 11:23 AM

## 2019-10-25 ENCOUNTER — Ambulatory Visit (INDEPENDENT_AMBULATORY_CARE_PROVIDER_SITE_OTHER): Payer: Medicare Other

## 2019-10-25 DIAGNOSIS — Z95810 Presence of automatic (implantable) cardiac defibrillator: Secondary | ICD-10-CM

## 2019-10-25 DIAGNOSIS — I5022 Chronic systolic (congestive) heart failure: Secondary | ICD-10-CM

## 2019-10-25 NOTE — Progress Notes (Signed)
EPIC Encounter for ICM Monitoring  Patient Name: Becky [name] is a 69 y.o. female Date: 10/25/2019 Primary Care Physican: Bedelia Person, MD Primary Cardiologist:Branch Electrophysiologist: Allred Bi-V Pacing:96.7% 08/26/2019 OfficeWeight: 189lbs(does not weigh at home)  Time in AT/AF0.0 hr/day (0.0%)   Spoke with patient and reports feeling well at this time.  Denies fluid symptoms.   She is feeling better since fluid has resolved.   OptivolThoracic impedance returned to normal after taking extra Furosemide.  Prescribed:Furosemide 40 mgTake 1 tablet (40 mg) by mouth scheduled in the morning, may take an additional 0.5 tablet (20 mg) if needed for swelling.  Labs: 08/10/2019 Creatinine 1.58, BUN 18, Potassium 3.8, Sodium 136, GFR 33-38 04/26/2019 Creatinine1.76, BUN16, Potassium4.5, Sodium143, [id number] A complete set of results can be found in Results Review.  Recommendations:No changes and encouraged to call if experiencing any fluid symptoms.   Follow-up plan: ICM clinic phone appointment on10/05/2019.91 day device clinic remote transmission scheduled9/22/2021.   EP/Cardiology Office Visits:12/27/2019 with Dr.Branchand 01/20/2020 with Dr Johney Frame.   Copy of ICM check sent to Dr.Allred and Dr Wyline Mood for [facility] fluid accumulation suggests returned to normal.   3 month ICM trend: 10/25/2019    1 Year ICM trend:       Karie Soda, RN 10/25/2019 2:08 PM

## 2019-11-09 ENCOUNTER — Ambulatory Visit (INDEPENDENT_AMBULATORY_CARE_PROVIDER_SITE_OTHER): Payer: Medicare Other | Admitting: *Deleted

## 2019-11-09 DIAGNOSIS — I255 Ischemic cardiomyopathy: Secondary | ICD-10-CM

## 2019-11-09 LAB — CUP PACEART REMOTE DEVICE CHECK
Battery Remaining Longevity: 97 mo
Battery Voltage: 3 V
Brady Statistic AP VP Percent: 9.97 %
Brady Statistic AP VS Percent: 0.2 %
Brady Statistic AS VP Percent: 86.55 %
Brady Statistic AS VS Percent: 3.28 %
Brady Statistic RA Percent Paced: 10.15 %
Brady Statistic RV Percent Paced: 1.88 %
Date Time Interrogation Session: 20210922022822
HighPow Impedance: 67 Ohm
Implantable Lead Implant Date: 20150416
Implantable Lead Implant Date: 20150922
Implantable Lead Implant Date: 20150922
Implantable Lead Location: 753858
Implantable Lead Location: 753859
Implantable Lead Location: 753860
Implantable Lead Model: 4598
Implantable Lead Model: 5076
Implantable Lead Model: 6935
Implantable Pulse Generator Implant Date: 20210312
Lead Channel Impedance Value: 161.5 Ohm
Lead Channel Impedance Value: 161.5 Ohm
Lead Channel Impedance Value: 174.595
Lead Channel Impedance Value: 174.595
Lead Channel Impedance Value: 174.595
Lead Channel Impedance Value: 266 Ohm
Lead Channel Impedance Value: 323 Ohm
Lead Channel Impedance Value: 323 Ohm
Lead Channel Impedance Value: 323 Ohm
Lead Channel Impedance Value: 342 Ohm
Lead Channel Impedance Value: 380 Ohm
Lead Channel Impedance Value: 380 Ohm
Lead Channel Impedance Value: 380 Ohm
Lead Channel Impedance Value: 513 Ohm
Lead Channel Impedance Value: 551 Ohm
Lead Channel Impedance Value: 608 Ohm
Lead Channel Impedance Value: 646 Ohm
Lead Channel Impedance Value: 646 Ohm
Lead Channel Pacing Threshold Amplitude: 0.625 V
Lead Channel Pacing Threshold Amplitude: 1.125 V
Lead Channel Pacing Threshold Amplitude: 1.375 V
Lead Channel Pacing Threshold Pulse Width: 0.4 ms
Lead Channel Pacing Threshold Pulse Width: 0.4 ms
Lead Channel Pacing Threshold Pulse Width: 0.4 ms
Lead Channel Sensing Intrinsic Amplitude: 10.125 mV
Lead Channel Sensing Intrinsic Amplitude: 10.125 mV
Lead Channel Sensing Intrinsic Amplitude: 2.75 mV
Lead Channel Sensing Intrinsic Amplitude: 2.75 mV
Lead Channel Setting Pacing Amplitude: 1.5 V
Lead Channel Setting Pacing Amplitude: 2 V
Lead Channel Setting Pacing Amplitude: 2.5 V
Lead Channel Setting Pacing Pulse Width: 0.4 ms
Lead Channel Setting Pacing Pulse Width: 0.4 ms
Lead Channel Setting Sensing Sensitivity: 0.3 mV

## 2019-11-11 NOTE — Progress Notes (Signed)
Remote ICD transmission.   

## 2019-11-21 ENCOUNTER — Ambulatory Visit (INDEPENDENT_AMBULATORY_CARE_PROVIDER_SITE_OTHER): Payer: Medicare Other

## 2019-11-21 DIAGNOSIS — Z95810 Presence of automatic (implantable) cardiac defibrillator: Secondary | ICD-10-CM | POA: Diagnosis not present

## 2019-11-21 DIAGNOSIS — I5022 Chronic systolic (congestive) heart failure: Secondary | ICD-10-CM | POA: Diagnosis not present

## 2019-11-24 ENCOUNTER — Telehealth: Payer: Self-pay

## 2019-11-24 NOTE — Telephone Encounter (Signed)
Remote ICM transmission received.  Attempted call to patient regarding ICM remote transmission and left message to return call   

## 2019-11-24 NOTE — Progress Notes (Signed)
EPIC Encounter for ICM Monitoring  Patient Name: Becky [name] is a 69 y.o. female Date: 11/24/2019 Primary Care Physican: Bedelia Person, MD Primary Cardiologist:Branch Electrophysiologist: Allred Bi-V Pacing:95.3% 08/26/2019 OfficeWeight: 189lbs(does not weigh at home)  Time in AT/AF0.0 hr/day (0.0%)   Spoke with patient and reports feeling well at this time.  Denies fluid symptoms.    OptivolThoracic impedancenormal but suggesting possible fluid accumulation from 9/17-10/3/21 with exception of a couple of days at baseline.  Prescribed:Furosemide 40 mgTake 1 tablet (40 mg) by mouth scheduled in the morning, may take an additional 0.5 tablet (20 mg) if needed for swelling.  Labs: 08/10/2019 Creatinine 1.58, BUN 18, Potassium 3.8, Sodium 136, GFR 33-38 04/26/2019 Creatinine1.76, BUN16, Potassium4.5, Sodium143, [id number] A complete set of results can be found in Results Review.  Recommendations: No changes and encouraged to call if experiencing any fluid symptoms.   Follow-up plan: ICM clinic phone appointment on11/09/2019.91 day device clinic remote transmission scheduled12/22/2021.   EP/Cardiology Office Visits:12/27/2019 with Dr.Branchand 01/20/2020 with Dr Johney Frame.   Copy of ICM check sent to Dr.Allred  3 month ICM trend: 11/21/2019    1 Year ICM trend:       Karie Soda, RN 11/24/2019 1:06 PM

## 2019-12-13 ENCOUNTER — Other Ambulatory Visit: Payer: Self-pay | Admitting: Cardiology

## 2019-12-21 ENCOUNTER — Other Ambulatory Visit: Payer: Self-pay | Admitting: Cardiology

## 2019-12-23 ENCOUNTER — Telehealth: Payer: Self-pay

## 2019-12-23 NOTE — Telephone Encounter (Signed)
Attempted return call to patient as requested by voice mail message regarding question if appointment with Dr Wyline Mood is virtual in in office.  Left message stating the 11/9 appointment with Dr Wyline Mood shows in office at 9:20 AM.  Advised ICM remote transmission is scheduled 12/26/2019.

## 2019-12-26 ENCOUNTER — Ambulatory Visit (INDEPENDENT_AMBULATORY_CARE_PROVIDER_SITE_OTHER): Payer: Medicare Other

## 2019-12-26 DIAGNOSIS — I5022 Chronic systolic (congestive) heart failure: Secondary | ICD-10-CM | POA: Diagnosis not present

## 2019-12-26 DIAGNOSIS — Z95810 Presence of automatic (implantable) cardiac defibrillator: Secondary | ICD-10-CM | POA: Diagnosis not present

## 2019-12-27 ENCOUNTER — Ambulatory Visit: Payer: Medicare Other | Admitting: Cardiology

## 2019-12-28 NOTE — Progress Notes (Signed)
EPIC Encounter for ICM Monitoring  Patient Name: Becky [name] is a 69 y.o. female Date: 12/28/2019 Primary Care Physican: Bedelia Person, MD Primary Cardiologist:Branch Electrophysiologist: Allred Bi-V Pacing:94.1% 12/28/2019 Weight: 189lbs  Time in AT/AF0.0 hr/day (0.0%)   Spoke with patient and reports feeling well at this time.  Denies fluid symptoms.    OptivolThoracic impedancenormal.  Prescribed:Furosemide 40 mgTake 1 tablet (40 mg) by mouth scheduled in the morning, may take an additional 0.5 tablet (20 mg) if needed for swelling.  Labs: 08/10/2019 Creatinine 1.58, BUN 18, Potassium 3.8, Sodium 136, GFR 33-38 04/26/2019 Creatinine1.76, BUN16, Potassium4.5, Sodium143, [id number] A complete set of results can be found in Results Review.  Recommendations: No changes and encouraged to call if experiencing any fluid symptoms.   Follow-up plan: ICM clinic phone appointment on12/13/2021.91 day device clinic remote transmission scheduled12/22/2021.   EP/Cardiology Office Visits:  Patient canceled 12/27/2019 with Dr.Branchand will reschedule.  01/20/2020 with Dr Johney Frame.   Copy of ICM check sent to Dr.Allred  3 month ICM trend: 12/26/2019    1 Year ICM trend:       Karie Soda, RN 12/28/2019 1:00 PM

## 2020-01-20 ENCOUNTER — Encounter: Payer: Medicare Other | Admitting: Internal Medicine

## 2020-01-30 ENCOUNTER — Telehealth: Payer: Self-pay

## 2020-01-30 ENCOUNTER — Ambulatory Visit (INDEPENDENT_AMBULATORY_CARE_PROVIDER_SITE_OTHER): Payer: Medicare Other

## 2020-01-30 DIAGNOSIS — I5022 Chronic systolic (congestive) heart failure: Secondary | ICD-10-CM | POA: Diagnosis not present

## 2020-01-30 DIAGNOSIS — Z95810 Presence of automatic (implantable) cardiac defibrillator: Secondary | ICD-10-CM | POA: Diagnosis not present

## 2020-01-30 NOTE — Telephone Encounter (Signed)
Remote ICM transmission received.  Attempted call to patient regarding ICM remote transmission and left message per DPR to return call.   

## 2020-01-30 NOTE — Progress Notes (Signed)
EPIC Encounter for ICM Monitoring  Patient Name: Becky [name] is a 69 y.o. female Date: 01/30/2020 Primary Care Physican: Bedelia Person, MD Primary Cardiologist:Branch Electrophysiologist: Allred Bi-V Pacing:96.2% 12/28/2019 Weight: 189lbs 01/30/2020 Weight: 195 lbs  Time in AT/AF0.0 hr/day (0.0%)   Spoke with patient and reports 5-6 lb weight gain and SOB.    OptivolThoracic impedancesuggesting possible fluid accumulation since 01/24/2020.  Prescribed:Furosemide 40 mgTake 1 tablet (40 mg) by mouth scheduled in the morning, may take an additional 0.5 tablet (20 mg) if needed for swelling.  Labs: 08/10/2019 Creatinine 1.58, BUN 18, Potassium 3.8, Sodium 136, GFR 33-38 04/26/2019 Creatinine1.76, BUN16, Potassium4.5, Sodium143, [id number] A complete set of results can be found in Results Review.  Recommendations: Advised to take additional Furosemide 20 mg x 3 days and fluid levels will be rechecked at 12/17 OV with Dr Johney Frame.  Follow-up plan: ICM clinic phone appointment on1/17/2022.91 day device clinic remote transmission scheduled12/22/2021.   EP/Cardiology Office Visits: 03/08/2020 with Dr.Branch.  02/03/2020 with Dr Johney Frame.   Copy of ICM check sent to Dr.Allred and Dr Wyline Mood.  3 month ICM trend: 01/30/2020    1 Year ICM trend:      Karie Soda, RN 01/30/2020 2:00 PM

## 2020-02-03 ENCOUNTER — Encounter: Payer: Self-pay | Admitting: Internal Medicine

## 2020-02-03 ENCOUNTER — Ambulatory Visit (INDEPENDENT_AMBULATORY_CARE_PROVIDER_SITE_OTHER): Payer: Medicare Other | Admitting: Internal Medicine

## 2020-02-03 VITALS — BP 140/56 | HR 76 | Ht 60.0 in | Wt 190.6 lb

## 2020-02-03 DIAGNOSIS — I10 Essential (primary) hypertension: Secondary | ICD-10-CM

## 2020-02-03 DIAGNOSIS — I255 Ischemic cardiomyopathy: Secondary | ICD-10-CM

## 2020-02-03 DIAGNOSIS — I5022 Chronic systolic (congestive) heart failure: Secondary | ICD-10-CM

## 2020-02-03 NOTE — Addendum Note (Signed)
Addended by: Eustace Moore on: 02/03/2020 04:00 PM   Modules accepted: Orders

## 2020-02-03 NOTE — Progress Notes (Signed)
PCP: Bedelia Person, MD Primary Cardiologist: Dr Wyline Mood Primary EP: Dr Lafayette Dragon Becky [name] is a 69 y.o. female who presents today for routine electrophysiology followup.  Since last being seen in our clinic, the patient reports doing very well.  Today, she denies symptoms of palpitations, chest pain, shortness of breath,  lower extremity edema, dizziness, presyncope, syncope, or ICD shocks.  The patient is otherwise without complaint today.   Past Medical History:  Diagnosis Date  . AICD (automatic cardioverter/defibrillator) present   . Anxiety   . Arthritis    "all my joints; neck, back, legs, arms, elbows"  (10/03/2015)  . Asthma   . CAD (coronary artery disease)    a. 5/08: s/p DES to PDA and DES to LAD; b. 1/09: s/p DES to pLAD; c. 1/11: s/p DES to LAD x 2, d. LHC (3/12 in [place] - EF 55%, mild ant HK, nl LM, LAD stents ok, oLAD 30, RCA stents ok;  e.  4/13:  s/p DES to RCA, f. 1/14: dLM 20-30, mOM3 20-30, mLAD 80 ISR => s/p 2.5x16 mm Promus Element DES; EF 45-50%   . CHF (congestive heart failure) (HCC)   . Chronic bronchitis (HCC)   . Chronic lower back pain    "into pelvis region" (10/03/2015)  . COPD (chronic obstructive pulmonary disease) (HCC)   . Daily headache    "might be from sinus" (10/03/2015)  . Depression    hx (10/03/2015)  . Fibromyalgia   . GERD (gastroesophageal reflux disease)   . Heart murmur   . HLD (hyperlipidemia)   . Hypertension   . Ischemic cardiomyopathy    a. Echo (11/14):  EF 25%   . LBBB (left bundle branch block)   . Myocardial infarction (HCC) 12/2012  . OSA on CPAP   . Pneumonia 1950s  . Syncope 11/14   LifeVest placed  . Upper back pain, chronic    "into my shoulders; fibromyalgia works here" (10/03/2015)   Past Surgical History:  Procedure Laterality Date  . BI-VENTRICULAR IMPLANTABLE CARDIOVERTER DEFIBRILLATOR N/A 06/02/2013   Procedure: BI-VENTRICULAR IMPLANTABLE CARDIOVERTER DEFIBRILLATOR  (CRT-D);  Surgeon: Gardiner Rhyme, MD;  Location: [facility] CATH LAB;  Service: Cardiovascular;  Laterality: N/A;  . BI-VENTRICULAR IMPLANTABLE CARDIOVERTER DEFIBRILLATOR  (CRT-D)  06/02/2013   MDT VivaQuad CRTD implanted by Dr Johney Frame for ICM, CHF  . BIV ICD GENERATOR CHANGEOUT N/A 04/29/2019   Procedure: BIV ICD GENERATOR CHANGEOUT;  Surgeon: Hillis Range, MD;  Location: [facility] INVASIVE CV LAB;  Service: Cardiovascular;  Laterality: N/A;  . CARDIAC CATHETERIZATION  12/2012   S/P MI  . CORONARY ANGIOPLASTY WITH STENT PLACEMENT  2007; 2009; 2011   "?2 +1 +1 "  . CORONARY ANGIOPLASTY WITH STENT PLACEMENT  02/2012  . FRACTURE SURGERY    . HYSTEROSCOPY W/ ENDOMETRIAL ABLATION  ~ 1998  . LEAD REVISION  11-08-13   RA and LV lead revision by Dr Johney Frame  . LEAD REVISION Left 11/08/2013   Procedure: LEAD REVISION;  Surgeon: Gardiner Rhyme, MD;  Location[facility] CATH LAB;  Service: Cardiovascular;  Laterality: Left;  . LEFT HEART CATHETERIZATION WITH CORONARY ANGIOGRAM N/A 01/03/2013   Procedure: LEFT HEART CATHETERIZATION WITH CORONARY ANGIOGRAM;  Surgeon: Kathleene Hazel, MD;  Location: [facility] CATH LAB;  Service: Cardiovascular;  Laterality: N/A;  . ORIF ANKLE FRACTURE Right 10/21/2009  . TUBAL LIGATION  1989    ROS- all systems are reviewed and negative except as per HPI above  Current Outpatient Medications  Medication Sig Dispense Refill  . acetaminophen-codeine (TYLENOL #3) 300-30 MG tablet Take 1 tablet by mouth every 6 (six) hours as needed for moderate pain.   2  . aspirin EC 81 MG tablet Take 1 tablet (81 mg total) by mouth daily.    . Aspirin-Salicylamide-Caffeine (BC HEADACHE PO) Take 1 packet by mouth daily as needed (pain.).     Marland Kitchen cetirizine (ZYRTEC) 10 MG tablet Take 10 mg by mouth at bedtime.   4  . clopidogrel (PLAVIX) 75 MG tablet Take 75 mg by mouth daily.     Marland Kitchen escitalopram (LEXAPRO) 20 MG tablet Take 20 mg by mouth daily.     . fluticasone (FLONASE) 50 MCG/ACT nasal spray Place 2 sprays into both nostrils at bedtime.    7  . formoterol (PERFOROMIST) 20 MCG/2ML nebulizer solution Take 20 mcg by nebulization 2 (two) times daily.    . furosemide (LASIX) 40 MG tablet TAKE 1 TABLET BY MOUTH DAILY. MAY TAKE ADDITIONAL 1/2 TABLET AS NEEDED FOR SWELLING 90 tablet 3  . gabapentin (NEURONTIN) 300 MG capsule Take 300 mg by mouth 3 (three) times daily.     Marland Kitchen ipratropium-albuterol (DUONEB) 0.5-2.5 (3) MG/3ML SOLN Take 3 mLs by nebulization every 6 (six) hours as needed (shortness of breath).    . lactulose (CHRONULAC) 10 GM/15ML solution Take 10 g by mouth 2 (two) times daily as needed for mild constipation.    . magnesium oxide (MAG-OX) 400 MG tablet Take 400 mg by mouth daily.    . metFORMIN (GLUCOPHAGE) 500 MG tablet Take 1,000 mg by mouth 2 (two) times daily with a meal.     . metoprolol succinate (TOPROL-XL) 50 MG 24 hr tablet TAKE 1 TABLET BY MOUTH DAILY WITH OR IMMEDIATELY FOLLOWING A MEAL(DOSE DECREASE) 90 tablet 3  . montelukast (SINGULAIR) 10 MG tablet Take 10 mg by mouth at bedtime.     . nitroGLYCERIN (NITROSTAT) 0.4 MG SL tablet Place 1 tablet (0.4 mg total) under the tongue every 5 (five) minutes x 3 doses as needed. If no relief after 3rd dose, proceed to ED 25 tablet 5  . pantoprazole (PROTONIX) 40 MG tablet TAKE 1 TABLET(40 MG) BY MOUTH DAILY 90 tablet 2  . pravastatin (PRAVACHOL) 80 MG tablet TAKE 1 TABLET(80 MG) BY MOUTH EVERY EVENING 90 tablet 2  . rOPINIRole (REQUIP) 0.5 MG tablet Take 1 mg by mouth at bedtime.     Marland Kitchen spironolactone (ALDACTONE) 25 MG tablet TAKE 1/2 TABLET(12.5 MG) BY MOUTH DAILY 45 tablet 3  . VENTOLIN HFA 108 (90 Base) MCG/ACT inhaler Inhale 1-2 puffs into the lungs every 6 (six) hours as needed for wheezing or shortness of breath.      No current facility-administered medications for this visit.    Physical Exam: There were no vitals filed for this visit.  GEN- The patient is well appearing, alert and oriented x 3 today.   Head- normocephalic, atraumatic Eyes-  Sclera clear,  conjunctiva pink Ears- hearing intact Oropharynx- clear Lungs- Clear to ausculation bilaterally, normal work of breathing Chest- ICD pocket is well healed Heart- Regular rate and rhythm, no murmurs, rubs or gallops, PMI not laterally displaced GI- soft, NT, ND, + BS Extremities- no clubbing, cyanosis, or edema  ICD interrogation- reviewed in detail today,  See PACEART report  ekg tracing ordered today is personally reviewed and shows artifact, BiV paced  Wt Readings from Last 3 Encounters:  08/26/19 189 lb (85.7 kg)  04/29/19 170 lb (77.1 kg)  04/15/19  180 lb (81.6 kg)    Assessment and Plan:  1.  Chronic systolic dysfunction/ ischemic CM/ LBBB euvolemic today Stable on an appropriate medical regimen Normal ICD function See Pace Art report No changes today she is not device dependant today  followed in ICM device clinic  2. OSA Compliance with CPAP advised  3. HTN Stable No change required today   Risks, benefits and potential toxicities for medications prescribed and/or refilled reviewed with patient today.   Return in a year  Hillis Range MD, [facility] 02/03/2020 10:13 AM

## 2020-02-03 NOTE — Patient Instructions (Signed)
Medication Instructions:   Your physician recommends that you continue on your current medications as directed. Please refer to the Current Medication list given to you today.  Labwork:  None  Testing/Procedures:  None  Follow-Up:  Your physician recommends that you schedule a follow-up appointment in: 1 year.  Any Other Special Instructions Will Be Listed Below (If Applicable).  If you need a refill on your cardiac medications before your next appointment, please call your pharmacy. 

## 2020-02-08 ENCOUNTER — Ambulatory Visit (INDEPENDENT_AMBULATORY_CARE_PROVIDER_SITE_OTHER): Payer: Medicare Other

## 2020-02-08 DIAGNOSIS — I5022 Chronic systolic (congestive) heart failure: Secondary | ICD-10-CM

## 2020-02-08 DIAGNOSIS — I255 Ischemic cardiomyopathy: Secondary | ICD-10-CM

## 2020-02-11 ENCOUNTER — Other Ambulatory Visit: Payer: Self-pay | Admitting: Cardiology

## 2020-02-11 LAB — CUP PACEART REMOTE DEVICE CHECK
Battery Remaining Longevity: 95 mo
Battery Voltage: 3 V
Brady Statistic AP VP Percent: 3.14 %
Brady Statistic AP VS Percent: 0.06 %
Brady Statistic AS VP Percent: 93.41 %
Brady Statistic AS VS Percent: 3.39 %
Brady Statistic RA Percent Paced: 3.17 %
Brady Statistic RV Percent Paced: 2.97 %
Date Time Interrogation Session: 20211222043822
HighPow Impedance: 91 Ohm
Implantable Lead Implant Date: 20150416
Implantable Lead Implant Date: 20150922
Implantable Lead Implant Date: 20150922
Implantable Lead Location: 753858
Implantable Lead Location: 753859
Implantable Lead Location: 753860
Implantable Lead Model: 4598
Implantable Lead Model: 5076
Implantable Lead Model: 6935
Implantable Pulse Generator Implant Date: 20210312
Lead Channel Impedance Value: 155.455
Lead Channel Impedance Value: 155.455
Lead Channel Impedance Value: 162.857
Lead Channel Impedance Value: 162.857
Lead Channel Impedance Value: 180 Ohm
Lead Channel Impedance Value: 266 Ohm
Lead Channel Impedance Value: 285 Ohm
Lead Channel Impedance Value: 285 Ohm
Lead Channel Impedance Value: 342 Ohm
Lead Channel Impedance Value: 342 Ohm
Lead Channel Impedance Value: 342 Ohm
Lead Channel Impedance Value: 342 Ohm
Lead Channel Impedance Value: 380 Ohm
Lead Channel Impedance Value: 513 Ohm
Lead Channel Impedance Value: 513 Ohm
Lead Channel Impedance Value: 551 Ohm
Lead Channel Impedance Value: 608 Ohm
Lead Channel Impedance Value: 646 Ohm
Lead Channel Pacing Threshold Amplitude: 0.625 V
Lead Channel Pacing Threshold Amplitude: 1.125 V
Lead Channel Pacing Threshold Amplitude: 1.125 V
Lead Channel Pacing Threshold Pulse Width: 0.4 ms
Lead Channel Pacing Threshold Pulse Width: 0.4 ms
Lead Channel Pacing Threshold Pulse Width: 0.4 ms
Lead Channel Sensing Intrinsic Amplitude: 3 mV
Lead Channel Sensing Intrinsic Amplitude: 3 mV
Lead Channel Sensing Intrinsic Amplitude: 7 mV
Lead Channel Sensing Intrinsic Amplitude: 7 mV
Lead Channel Setting Pacing Amplitude: 1.5 V
Lead Channel Setting Pacing Amplitude: 2 V
Lead Channel Setting Pacing Amplitude: 2.25 V
Lead Channel Setting Pacing Pulse Width: 0.4 ms
Lead Channel Setting Pacing Pulse Width: 0.4 ms
Lead Channel Setting Sensing Sensitivity: 0.3 mV

## 2020-02-22 NOTE — Progress Notes (Signed)
Remote ICD transmission.   

## 2020-03-05 ENCOUNTER — Ambulatory Visit (INDEPENDENT_AMBULATORY_CARE_PROVIDER_SITE_OTHER): Payer: Medicare Other

## 2020-03-05 DIAGNOSIS — Z95810 Presence of automatic (implantable) cardiac defibrillator: Secondary | ICD-10-CM

## 2020-03-05 DIAGNOSIS — I5022 Chronic systolic (congestive) heart failure: Secondary | ICD-10-CM | POA: Diagnosis not present

## 2020-03-05 NOTE — Progress Notes (Signed)
EPIC Encounter for ICM Monitoring  Patient Name: Becky [name] is a 70 y.o. female Date: 03/05/2020 Primary Care Physican: Bedelia Person, MD Primary Cardiologist:Branch Electrophysiologist: Allred Bi-V Pacing:97.4% 01/30/2020 Weight: 195 lbs  Time in AT/AF0.0 hr/day (0.0%)   Spoke with patient and reports she has respiratory infection but is improving.  She has chest congestion which relates to decreased impedance.     OptivolThoracic impedancesuggesting possible fluid accumulation since 03/02/2020.  Prescribed:Furosemide 40 mgTake 1 tablet (40 mg) by mouth scheduled in the morning, may take an additional 0.5 tablet (20 mg) if needed for swelling.  Labs: 08/10/2019 Creatinine 1.58, BUN 18, Potassium 3.8, Sodium 136, GFR 33-38 04/26/2019 Creatinine1.76, BUN16, Potassium4.5, Sodium143, [id number] A complete set of results can be found in Results Review.  Recommendations: No changes and encouraged to call if experiencing any fluid symptoms.  Follow-up plan: ICM clinic phone appointment on2/21/2022.91 day device clinic remote transmission scheduled3/23/2022.   EP/Cardiology Office Visits: 03/08/2020 with Dr.Branch.01/18/2021 with Dr Johney Frame.   Copy of ICM check sent to Dr.Allred and Dr Wyline Mood.   3 month ICM trend: 03/05/2020.    1 Year ICM trend:       Karie Soda, RN 03/05/2020 2:22 PM

## 2020-03-08 ENCOUNTER — Ambulatory Visit: Payer: Medicare Other | Admitting: Cardiology

## 2020-04-09 ENCOUNTER — Ambulatory Visit (INDEPENDENT_AMBULATORY_CARE_PROVIDER_SITE_OTHER): Payer: Medicare Other

## 2020-04-09 DIAGNOSIS — Z95810 Presence of automatic (implantable) cardiac defibrillator: Secondary | ICD-10-CM | POA: Diagnosis not present

## 2020-04-09 DIAGNOSIS — I5022 Chronic systolic (congestive) heart failure: Secondary | ICD-10-CM

## 2020-04-13 NOTE — Progress Notes (Signed)
EPIC Encounter for ICM Monitoring  Patient Name: Becky [name] is a 70 y.o. female Date: 04/13/2020 Primary Care Physican: Bedelia Person, MD Primary Cardiologist:Branch Electrophysiologist: Allred Bi-V Pacing:96.5% 04/13/2020 Weight: 190- 195 lbs  Time in AT/AF0.0 hr/day (0.0%)   Spoke with patient and reports she is feeling fine.  She reports drinking more fluids in the last month which may contribute to decreased impedance.    OptivolThoracic impedancenormal but was suggesting possible fluid accumulation since 03/02/2020 - 04/02/2020.  Prescribed:Furosemide 40 mgTake 1 tablet (40 mg) by mouth scheduled in the morning, may take an additional 0.5 tablet (20 mg) if needed for swelling.  Labs: 08/10/2019 Creatinine 1.58, BUN 18, Potassium 3.8, Sodium 136, GFR 33-38 04/26/2019 Creatinine1.76, BUN16, Potassium4.5, Sodium143, [id number] A complete set of results can be found in Results Review.  Recommendations: No changes and encouraged to call if experiencing any fluid symptoms.  Follow-up plan: ICM clinic phone appointment on3/25/2022.91 day device clinic remote transmission scheduled3/23/2022.   EP/Cardiology Office Visits: 3/30/2022with Dr.Branch.01/18/2021 with Dr Johney Frame.   Copy of ICM check sent to Dr.Allred   3 month ICM trend: 04/09/2020.    1 Year ICM trend:       Karie Soda, RN 04/13/2020 2:41 PM

## 2020-05-09 ENCOUNTER — Ambulatory Visit (INDEPENDENT_AMBULATORY_CARE_PROVIDER_SITE_OTHER): Payer: Medicare Other

## 2020-05-09 DIAGNOSIS — I5022 Chronic systolic (congestive) heart failure: Secondary | ICD-10-CM

## 2020-05-09 DIAGNOSIS — I255 Ischemic cardiomyopathy: Secondary | ICD-10-CM

## 2020-05-09 LAB — CUP PACEART REMOTE DEVICE CHECK
Battery Remaining Longevity: 94 mo
Battery Voltage: 3 V
Brady Statistic AP VP Percent: 12.47 %
Brady Statistic AP VS Percent: 0.23 %
Brady Statistic AS VP Percent: 84.88 %
Brady Statistic AS VS Percent: 2.42 %
Brady Statistic RA Percent Paced: 12.63 %
Brady Statistic RV Percent Paced: 1.93 %
Date Time Interrogation Session: 20220323012504
HighPow Impedance: 91 Ohm
Implantable Lead Implant Date: 20150416
Implantable Lead Implant Date: 20150922
Implantable Lead Implant Date: 20150922
Implantable Lead Location: 753858
Implantable Lead Location: 753859
Implantable Lead Location: 753860
Implantable Lead Model: 4598
Implantable Lead Model: 5076
Implantable Lead Model: 6935
Implantable Pulse Generator Implant Date: 20210312
Lead Channel Impedance Value: 151.406
Lead Channel Impedance Value: 151.406
Lead Channel Impedance Value: 162.857
Lead Channel Impedance Value: 162.857
Lead Channel Impedance Value: 174.595
Lead Channel Impedance Value: 285 Ohm
Lead Channel Impedance Value: 285 Ohm
Lead Channel Impedance Value: 285 Ohm
Lead Channel Impedance Value: 323 Ohm
Lead Channel Impedance Value: 342 Ohm
Lead Channel Impedance Value: 380 Ohm
Lead Channel Impedance Value: 380 Ohm
Lead Channel Impedance Value: 437 Ohm
Lead Channel Impedance Value: 513 Ohm
Lead Channel Impedance Value: 513 Ohm
Lead Channel Impedance Value: 551 Ohm
Lead Channel Impedance Value: 570 Ohm
Lead Channel Impedance Value: 608 Ohm
Lead Channel Pacing Threshold Amplitude: 0.75 V
Lead Channel Pacing Threshold Amplitude: 1.125 V
Lead Channel Pacing Threshold Amplitude: 4.75 V
Lead Channel Pacing Threshold Pulse Width: 0.4 ms
Lead Channel Pacing Threshold Pulse Width: 0.4 ms
Lead Channel Pacing Threshold Pulse Width: 0.4 ms
Lead Channel Sensing Intrinsic Amplitude: 2.875 mV
Lead Channel Sensing Intrinsic Amplitude: 2.875 mV
Lead Channel Sensing Intrinsic Amplitude: 7.75 mV
Lead Channel Sensing Intrinsic Amplitude: 7.75 mV
Lead Channel Setting Pacing Amplitude: 1.5 V
Lead Channel Setting Pacing Amplitude: 2 V
Lead Channel Setting Pacing Amplitude: 2.5 V
Lead Channel Setting Pacing Pulse Width: 0.4 ms
Lead Channel Setting Pacing Pulse Width: 0.4 ms
Lead Channel Setting Sensing Sensitivity: 0.3 mV

## 2020-05-10 ENCOUNTER — Telehealth: Payer: Self-pay | Admitting: Emergency Medicine

## 2020-05-10 NOTE — Telephone Encounter (Signed)
LMOM to call Device Clinic. Phone # and office hours provided. CRT-P  Deceased to 71.7% and LV threshold was 4.75 V at 0,4 ms. Schedule for device clinic appointment to assess LV lead.  p

## 2020-05-11 ENCOUNTER — Ambulatory Visit (INDEPENDENT_AMBULATORY_CARE_PROVIDER_SITE_OTHER): Payer: Medicare Other

## 2020-05-11 ENCOUNTER — Telehealth: Payer: Self-pay

## 2020-05-11 DIAGNOSIS — I5022 Chronic systolic (congestive) heart failure: Secondary | ICD-10-CM

## 2020-05-11 DIAGNOSIS — Z95810 Presence of automatic (implantable) cardiac defibrillator: Secondary | ICD-10-CM

## 2020-05-11 NOTE — Progress Notes (Signed)
EPIC Encounter for ICM Monitoring  Patient Name: Becky [name] is a 70 y.o. female Date: 05/11/2020 Primary Care Physican: Bedelia Person, MD Primary Cardiologist:Branch Electrophysiologist: Allred Bi-V Pacing:96.5% 04/13/2020 Weight: 190- 195 lbs  Time in AT/AF0.0 hr/day (0.0%)   Attempted call to patient and unable to reach.  Transmission reviewed.   OptivolThoracic impedancenormal but was suggesting possible fluid accumulation since3/07/2020 - 05/02/2020.  Device clinic nurse attempted pt call on 05/10/2020 due to CRT-P decreased to 71.7% and LV threshold was 4.75V at 0.55ms.  Device clinic wants to schedule appointment to assess LV lead.    Prescribed:Furosemide 40 mgTake 1 tablet (40 mg) by mouth scheduled in the morning, may take an additional 0.5 tablet (20 mg) if needed for swelling.  Labs: 08/10/2019 Creatinine 1.58, BUN 18, Potassium 3.8, Sodium 136, GFR 33-38 04/26/2019 Creatinine1.76, BUN16, Potassium4.5, Sodium143, [id number] A complete set of results can be found in Results Review.  Recommendations: Unable to reach.    Follow-up plan: ICM clinic phone appointment on4/25/2022.91 day device clinic remote transmission scheduled6/22/2022.   EP/Cardiology Office Visits: 3/30/2022with Dr.Branch.12/2/2022with Dr Johney Frame.   Copy of ICM check sent to Dr.Allred.   3 month ICM trend: 05/09/2020.    1 Year ICM trend:       Karie Soda, RN 05/11/2020 12:59 PM

## 2020-05-11 NOTE — Telephone Encounter (Signed)
Remote ICM transmission received.  Attempted call to patient regarding ICM remote transmission and no answer or voice mail option.  

## 2020-05-16 ENCOUNTER — Encounter: Payer: Self-pay | Admitting: Cardiology

## 2020-05-16 ENCOUNTER — Ambulatory Visit (INDEPENDENT_AMBULATORY_CARE_PROVIDER_SITE_OTHER): Payer: Medicare Other | Admitting: Cardiology

## 2020-05-16 VITALS — BP 150/70 | HR 76 | Ht 60.0 in | Wt 183.0 lb

## 2020-05-16 DIAGNOSIS — I5022 Chronic systolic (congestive) heart failure: Secondary | ICD-10-CM

## 2020-05-16 DIAGNOSIS — E782 Mixed hyperlipidemia: Secondary | ICD-10-CM | POA: Diagnosis not present

## 2020-05-16 DIAGNOSIS — I10 Essential (primary) hypertension: Secondary | ICD-10-CM

## 2020-05-16 NOTE — Patient Instructions (Signed)
Your physician recommends that you schedule a follow-up appointment in: 6 MONTHS WITH DR BRANCH  Your physician recommends that you continue on your current medications as directed. Please refer to the Current Medication list given to you today.  Thank you for choosing [facility]!!    

## 2020-05-16 NOTE — Progress Notes (Signed)
Clinical Summary Becky [name] is a 70 y.o.female  seen today for follow up of the following medical problems.  1. ICM/Chronic systolic heart failure - history of multiple PCIs as described below, mainly in [place].  - 04/2013 LVEF 30-35%. Repeat echo after BiV AICD 05/2014 shows LVEF 38%, grade I diastolic dysfunction.  - 09/2015 echo LVEF 55-60%, no WMAs, grade I diastolic dysfunction - 18/2993 echo [place]: LVEF 65-70%, moderate LVH.  - medical therapy has been limited by orthostatic symtpoms. Not on ACE or ARB -she has been on long term plavix due to her extensive stent burden  04/2019 echo LVEF 55-60%    - 05/11/20 device check showed change in LV threshold, needs device clinic appt No SOB or DOE. Compliant with meds. No recent edema   2. OSA  - compliant with cpap, followed by Dr Marjory Lies in [place], [place]. - remains compliant with cpap   3. Hyperlipidemia - muscle aches on multiple statins, has tolerated pravastatin - labs followed by pcp   4. HTN -compliant with meds - home SBPs in 120s - has not taken meds yet today  5. COPD - followed bypulmonary in [place] - followed by Dr Luciana Axe     Past Medical History:  Diagnosis Date  . AICD (automatic cardioverter/defibrillator) present   . Anxiety   . Arthritis    "all my joints; neck, back, legs, arms, elbows"  (10/03/2015)  . Asthma   . CAD (coronary artery disease)    a. 5/08: s/p DES to PDA and DES to LAD; b. 1/09: s/p DES to pLAD; c. 1/11: s/p DES to LAD x 2, d. LHC (3/12 in [place] - EF 55%, mild ant HK, nl LM, LAD stents ok, oLAD 30, RCA stents ok;  e.  4/13:  s/p DES to RCA, f. 1/14: dLM 20-30, mOM3 20-30, mLAD 80 ISR => s/p 2.5x16 mm Promus Element DES; EF 45-50%   . CHF (congestive heart failure) ([place])   . Chronic bronchitis ([place])   . Chronic lower back pain    "into pelvis region" (10/03/2015)  . COPD (chronic obstructive pulmonary disease) ([place])   . Daily  headache    "might be from sinus" (10/03/2015)  . Depression    hx (10/03/2015)  . Fibromyalgia   . GERD (gastroesophageal reflux disease)   . Heart murmur   . HLD (hyperlipidemia)   . Hypertension   . Ischemic cardiomyopathy    a. Echo (11/14):  EF 25%   . LBBB (left bundle [place] block)   . Myocardial infarction ([place]) 12/2012  . OSA on CPAP   . Pneumonia 1950s  . Syncope 11/14   LifeVest placed  . Upper back pain, chronic    "into my shoulders; fibromyalgia works here" (10/03/2015)     Allergies  Allergen Reactions  . Tape Rash and Other (See Comments)    Severe Reaction (latex tape): Burned hand, Redness-took days to clear up (03/02/12)  . Lipitor [Atorvastatin] Other (See Comments)    Leg pain  . Prozac [Fluoxetine Hcl] Other (See Comments)    Mean/violent thoughts  . Codeine Nausea And Vomiting  . Fosamax [Alendronate Sodium] Nausea Only and Other (See Comments)    Stomach pain  . Keflex [Cephalexin] Nausea Only     Current Outpatient Medications  Medication Sig Dispense Refill  . acetaminophen-codeine (TYLENOL #3) 300-30 MG tablet Take 1 tablet by mouth every 6 (six) hours as needed for moderate pain.   2  . aspirin EC  81 MG tablet Take 1 tablet (81 mg total) by mouth daily.    . Aspirin-Salicylamide-Caffeine (BC HEADACHE PO) Take 1 packet by mouth daily as needed (pain.).     Marland Kitchen b complex vitamins capsule Take 1 capsule by mouth daily.    . cetirizine (ZYRTEC) 10 MG tablet Take 10 mg by mouth at bedtime.   4  . cholecalciferol (VITAMIN D3) 25 MCG (1000 UNIT) tablet Take 1,000 Units by mouth daily.    . clopidogrel (PLAVIX) 75 MG tablet Take 75 mg by mouth daily.     Marland Kitchen escitalopram (LEXAPRO) 20 MG tablet Take 20 mg by mouth daily.     . fluticasone (FLONASE) 50 MCG/ACT nasal spray Place 2 sprays into both nostrils at bedtime.   7  . formoterol (PERFOROMIST) 20 MCG/2ML nebulizer solution Take 20 mcg by nebulization 2 (two) times daily.    . furosemide (LASIX) 40 MG  tablet TAKE 1 TABLET BY MOUTH DAILY. MAY TAKE ADDITIONAL 1/2 TABLET AS NEEDED FOR SWELLING 90 tablet 3  . gabapentin (NEURONTIN) 300 MG capsule Take 300 mg by mouth 3 (three) times daily.     Marland Kitchen glipiZIDE (GLUCOTROL XL) 5 MG 24 hr tablet Take 5 mg by mouth daily.    Marland Kitchen ipratropium-albuterol (DUONEB) 0.5-2.5 (3) MG/3ML SOLN Take 3 mLs by nebulization every 6 (six) hours as needed (shortness of breath).    . lactulose (CHRONULAC) 10 GM/15ML solution Take 10 g by mouth 2 (two) times daily as needed for mild constipation.    . magnesium oxide (MAG-OX) 400 MG tablet Take 400 mg by mouth daily.    . metFORMIN (GLUCOPHAGE) 500 MG tablet Take 1,000 mg by mouth 2 (two) times daily with a meal.     . metoprolol succinate (TOPROL-XL) 50 MG 24 hr tablet TAKE 1 TABLET BY MOUTH DAILY WITH OR IMMEDIATELY FOLLOWING A MEAL 90 tablet 3  . montelukast (SINGULAIR) 10 MG tablet Take 10 mg by mouth at bedtime.     . nitroGLYCERIN (NITROSTAT) 0.4 MG SL tablet Place 1 tablet (0.4 mg total) under the tongue every 5 (five) minutes x 3 doses as needed. If no relief after 3rd dose, proceed to ED 25 tablet 5  . pantoprazole (PROTONIX) 40 MG tablet TAKE 1 TABLET(40 MG) BY MOUTH DAILY 90 tablet 2  . pravastatin (PRAVACHOL) 80 MG tablet TAKE 1 TABLET(80 MG) BY MOUTH EVERY EVENING 90 tablet 2  . rOPINIRole (REQUIP) 0.5 MG tablet Take 1 mg by mouth at bedtime.     Marland Kitchen spironolactone (ALDACTONE) 25 MG tablet TAKE 1/2 TABLET(12.5 MG) BY MOUTH DAILY 45 tablet 3  . VENTOLIN HFA 108 (90 Base) MCG/ACT inhaler Inhale 1-2 puffs into the lungs every 6 (six) hours as needed for wheezing or shortness of breath.      No current facility-administered medications for this visit.     Past Surgical History:  Procedure Laterality Date  . BI-VENTRICULAR IMPLANTABLE CARDIOVERTER DEFIBRILLATOR N/A 06/02/2013   Procedure: BI-VENTRICULAR IMPLANTABLE CARDIOVERTER DEFIBRILLATOR  (CRT-D);  Surgeon: Coralyn Mark, MD;  Location: [facility] CATH LAB;  Service:  Cardiovascular;  Laterality: N/A;  . BI-VENTRICULAR IMPLANTABLE CARDIOVERTER DEFIBRILLATOR  (CRT-D)  06/02/2013   MDT VivaQuad CRTD implanted by Dr Rayann Heman for ICM, CHF  . BIV ICD GENERATOR CHANGEOUT N/A 04/29/2019   Procedure: BIV ICD GENERATOR CHANGEOUT;  Surgeon: Thompson Grayer, MD;  Location: [facility] CV LAB;  Service: Cardiovascular;  Laterality: N/A;  . CARDIAC CATHETERIZATION  12/2012   S/P MI  . CORONARY  ANGIOPLASTY WITH STENT PLACEMENT  2007; 2009; 2011   "?2 +1 +1 "  . CORONARY ANGIOPLASTY WITH STENT PLACEMENT  02/2012  . FRACTURE SURGERY    . HYSTEROSCOPY W/ ENDOMETRIAL ABLATION  ~ 1998  . LEAD REVISION  11-08-13   RA and LV lead revision by Dr Rayann Heman  . LEAD REVISION Left 11/08/2013   Procedure: LEAD REVISION;  Surgeon: Coralyn Mark, MD;  Location[facility] CATH LAB;  Service: Cardiovascular;  Laterality: Left;  . LEFT HEART CATHETERIZATION WITH CORONARY ANGIOGRAM N/A 01/03/2013   Procedure: LEFT HEART CATHETERIZATION WITH CORONARY ANGIOGRAM;  Surgeon: Burnell Blanks, MD;  Location: [facility] CATH LAB;  Service: Cardiovascular;  Laterality: N/A;  . ORIF ANKLE FRACTURE Right 10/21/2009  . TUBAL LIGATION  1989     Allergies  Allergen Reactions  . Tape Rash and Other (See Comments)    Severe Reaction (latex tape): Burned hand, Redness-took days to clear up (03/02/12)  . Lipitor [Atorvastatin] Other (See Comments)    Leg pain  . Prozac [Fluoxetine Hcl] Other (See Comments)    Mean/violent thoughts  . Codeine Nausea And Vomiting  . Fosamax [Alendronate Sodium] Nausea Only and Other (See Comments)    Stomach pain  . Keflex [Cephalexin] Nausea Only      Family History  Problem Relation Age of Onset  . Breast cancer Mother   . Heart failure Brother   . Breast cancer Sister   . Breast cancer Sister   . Uterine cancer Sister      Social History Ms. Falwell reports that she has been smoking cigarettes. She started smoking about 29 years ago. She has a 10.00 pack-year smoking  history. She has never used smokeless tobacco. Ms. Boehringer reports current alcohol use.   Review of Systems CONSTITUTIONAL: No weight loss, fever, chills, weakness or fatigue.  HEENT: Eyes: No visual loss, blurred vision, double vision or yellow sclerae.No hearing loss, sneezing, congestion, runny nose or sore throat.  SKIN: No rash or itching.  CARDIOVASCULAR: per hpi RESPIRATORY: No shortness of breath, cough or sputum.  GASTROINTESTINAL: No anorexia, nausea, vomiting or diarrhea. No abdominal pain or blood.  GENITOURINARY: No burning on urination, no polyuria NEUROLOGICAL: No headache, dizziness, syncope, paralysis, ataxia, numbness or tingling in the extremities. No change in bowel or bladder control.  MUSCULOSKELETAL: No muscle, back pain, joint pain or stiffness.  LYMPHATICS: No enlarged nodes. No history of splenectomy.  PSYCHIATRIC: No history of depression or anxiety.  ENDOCRINOLOGIC: No reports of sweating, cold or heat intolerance. No polyuria or polydipsia.  Marland Kitchen   Physical Examination Today's Vitals   05/16/20 0814  BP: (!) 150/70  Pulse: 76  SpO2: 96%  Weight: 183 lb (83 kg)  Height: 5' (1.524 m)   Body mass index is 35.74 kg/m.  Gen: resting comfortably, no acute distress HEENT: no scleral icterus, pupils equal round and reactive, no palptable cervical adenopathy,  CV: RRR, no m/rg, no jvd Resp: Clear to auscultation bilaterally GI: abdomen is soft, non-tender, non-distended, normal bowel sounds, no hepatosplenomegaly MSK: extremities are warm, no edema.  Skin: warm, no rash Neuro:  no focal deficits Psych: appropriate affect   Diagnostic Studies 04/2010 Cath [place], [place]: LVEF 55%, mild anterior hypokinesis. LM normal, LAD with stents in prox and mid portion widely patent, LAD ostial 30%. Diags with luminal irregs. LCX with luminial irregs. RCA with distal patent stents  Stent cards May 2008 DES to PDA, DES to LAD  Jan 2009 DES to prox LAD  Mar 05 2009 DES to LAD x2  05/2011 DES to RCA  Jan 2014 stent done [place], [place]  03/2010 Echo: LVEF 40%, hypokinesis of the anteroseptum.   01/02/13 Cath Hemodynamic Findings: Central aortic pressure: 147/79  Left ventricular pressure: 130/22/26  Angiographic Findings: Left main: 30% distal stenosis. It appears that the distal left main has a stent that continues into the LAD.  Left Anterior Descending Artery: Large vessel that courses to the apex. The entire proximal and mid LAD is stented. There is mild stent restenosis in the proximal segment. The mid stented segment has diffuse 30% stent restenosis. The distal vessel becomes small in caliber and has mild diffuse plaque. There is a moderate caliber first diagonal Lamarkus Nebel with mild plaque disease.  Circumflex Artery: Moderate caliber non-dominant vessel with three moderate caliber obtuse marginal branches. No obstructive disease.  Right Coronary Artery: Large dominant vessel with 30% proximal stenosis, heavily calcified mid vessel with patent stent in the mid vessel (no significant restenosis). The distal vessel has diffuse 40-50% stenosis. The posterolateral Greenlee Ancheta and PDA are moderate caliber, patent vessels with mild plaque disease.  Left Ventricular Angiogram: LVEF=25-30%. Hypokinesis of the antero-apical wall.  Impression:  1. 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 Elevated troponin following syncopal event. No culprit lesions seen.  3. Moderate to severe LV systolic dysfunction.  Recommendations: Continue medical management of CAD. Workup for syncope is underway. Carotid dopplers today. Will check d-dimer as PE is a possibility. She could also have had an arrythmia with LV dysfunction.  Complications: None. The patient tolerated the procedure well.  01/03/13 Echo - LVEF 25-30%, akinesis of the anteroseptal and apical myocardium, grade I diastolic dysfunction,  72/09/47 Carotid US - The  vertebral arteries appear patent with antegrade flow. - Findings consistent with1- 39 percent stenosis,high end of scale,involving the right internal carotid artery. - Findings consistent with 40 - 59 percent stenosis, low end of scale,involving the left internal carotid artery. - ICA/CCA ratio. right =1.72. left = 1.52 Other specific details can be found in the table(s) above. Prepared and Electronically Authenticated by 12/13/12 Event monitor No symptoms reported, sinus rhythm with conduction delay, occasional PVCs.  04/2013 Echo Study Conclusions  - Left ventricle: The cavity size was normal. Wall thickness was increased in a pattern of mild LVH with moderate basal septal hypertrophy. Systolic function was moderately to severely reduced. The estimated ejection fraction was in the range of 30% to 35%. There is akinesis to dyskinesis of the mid-distal anteroseptal and apical myocardium. There is akinesis of the distalinferoseptal myocardium. Doppler parameters are consistent with abnormal left ventricular relaxation (grade 1 diastolic dysfunction). - Ventricular septum: Septal motion showed abnormal function and dyssynergy. - Aortic valve: Mildly calcified annulus. Probably trileaflet; mildly calcified leaflets. No significant regurgitation. - Mitral valve: Calcified annulus. Trivial regurgitation. - Left atrium: The atrium was mildly dilated. - Right atrium: Central venous pressure: 87m Hg (est). - Tricuspid valve: Trivial regurgitation. - Pulmonary arteries: PA peak pressure: 232mHg (S). - Pericardium, extracardiac: There was no pericardial effusion. Impressions:  - [place] moderate basal septal hypertrophy, LVEF 30-35% with wall motion abnormalities as outlined, grade 1 diastolic dysfunction. Mild left atrial enlagement. MAC with trivial mitral regurgitation. MIldlysclerotic aortic valve. Trivial tricuspid regurgitation with normal PASP 20 mmHg. 05/2014  echo Study Conclusions  - Left ventricle: Technically limited study. The cavity size was normal. Wall thickness was increased in a pattern of moderate LVH. The estimated ejection fraction was 55%.  Doppler parameters are consistent with abnormal left ventricular relaxation (grade 1 diastolic dysfunction). - Aortic valve: Sclerosis without stenosis. - Right ventricle: The cavity size was normal. Pacer wire or catheter noted in right ventricle. Systolic function was normal. - Impressions: Since the study in 2015, there is definite improvement in LV function.  Impressions:  - Since the study in 2015, there is definite improvement in LV function.   09/2015 Nuclear Stress  There was no ST segment deviation noted during stress.  No T wave inversion was noted during stress.  Defect 1: There is a medium defect of mild severity present in the basal inferior and mid inferior location. This defect is likely a result of bowel loop attenuation artifact. No significant ischemia identified.  This is a low risk study. No significant ischemia identified.  Nuclear stress EF: 54%. Mid to apical septal wall hypokinesis noted   09/2015 echo Study Conclusions  - Left ventricle: The cavity size was normal. Systolic function was normal. The estimated ejection fraction was in the range of 55% to 60%. Wall motion was normal; there were no regional wall motion abnormalities. Doppler parameters are consistent with abnormal left ventricular relaxation (grade 1 diastolic dysfunction). Doppler parameters are consistent with indeterminate ventricular filling pressure. - Aortic valve: Transvalvular velocity was within the normal range. There was no stenosis. There was no regurgitation. - Mitral valve: Transvalvular velocity was within the normal range. There was no evidence for stenosis. There was no regurgitation. - Left atrium: The atrium was moderately dilated. -  Right ventricle: The cavity size was normal. Wall thickness was normal. Systolic function was normal. - Tricuspid valve: There was no regurgitation.     Assessment and Plan   1. ICM/Chronic systolic heart failure  - previous LVEF 30-35%. After BiV AICD repeat echo with normalized LVEF - medical therapy has been limited due to orthstatic symptoms. ACE-I previously stopped during 09/2015 admission due to hypotension. - indefinite plavix due to large stent burden  Doing well without symptoms, continue current meds We will try to help put device clinic in touch with her regardign changes in her LV lead during last check   2.HTN - elevated but has not taken meds yet today - reported home SBPs have been at goal - continue current meds   3. Hyperlipidemia - difficulty tolerating statins in the past, has tolerated pravastatin -continue statin, request pcp labs   F/u 6 months   Arnoldo Lenis, M.D.

## 2020-05-18 NOTE — Progress Notes (Signed)
Remote ICD transmission.   

## 2020-05-30 NOTE — Telephone Encounter (Signed)
Appointment with Tillery PA 06/01/20.

## 2020-06-01 ENCOUNTER — Encounter: Payer: Medicare Other | Admitting: Student

## 2020-06-11 ENCOUNTER — Ambulatory Visit (INDEPENDENT_AMBULATORY_CARE_PROVIDER_SITE_OTHER): Payer: Medicare Other | Admitting: Student

## 2020-06-11 ENCOUNTER — Encounter: Payer: Self-pay | Admitting: Student

## 2020-06-11 ENCOUNTER — Ambulatory Visit (INDEPENDENT_AMBULATORY_CARE_PROVIDER_SITE_OTHER): Payer: Medicare Other

## 2020-06-11 ENCOUNTER — Other Ambulatory Visit: Payer: Self-pay

## 2020-06-11 VITALS — BP 150/72 | HR 72 | Ht 60.0 in | Wt 177.0 lb

## 2020-06-11 DIAGNOSIS — I5022 Chronic systolic (congestive) heart failure: Secondary | ICD-10-CM

## 2020-06-11 DIAGNOSIS — Z95810 Presence of automatic (implantable) cardiac defibrillator: Secondary | ICD-10-CM | POA: Diagnosis not present

## 2020-06-11 DIAGNOSIS — I255 Ischemic cardiomyopathy: Secondary | ICD-10-CM

## 2020-06-11 LAB — CBC
Hematocrit: 41.8 % (ref 34.0–46.6)
Hemoglobin: 14 g/dL (ref 11.1–15.9)
MCH: 31 pg (ref 26.6–33.0)
MCHC: 33.5 g/dL (ref 31.5–35.7)
MCV: 93 fL (ref 79–97)
Platelets: 285 10*3/uL (ref 150–450)
RBC: 4.52 x10E6/uL (ref 3.77–5.28)
RDW: 13 % (ref 11.7–15.4)
WBC: 11.2 10*3/uL — ABNORMAL HIGH (ref 3.4–10.8)

## 2020-06-11 LAB — BASIC METABOLIC PANEL
BUN/Creatinine Ratio: 8 — ABNORMAL LOW (ref 12–28)
BUN: 14 mg/dL (ref 8–27)
CO2: 22 mmol/L (ref 20–29)
Calcium: 10.6 mg/dL — ABNORMAL HIGH (ref 8.7–10.3)
Chloride: 102 mmol/L (ref 96–106)
Creatinine, Ser: 1.8 mg/dL — ABNORMAL HIGH (ref 0.57–1.00)
Glucose: 179 mg/dL — ABNORMAL HIGH (ref 65–99)
Potassium: 3.9 mmol/L (ref 3.5–5.2)
Sodium: 140 mmol/L (ref 134–144)
eGFR: 30 mL/min/{1.73_m2} — ABNORMAL LOW (ref >59)

## 2020-06-11 LAB — MAGNESIUM: Magnesium: 2.2 mg/dL (ref 1.6–2.3)

## 2020-06-11 NOTE — Patient Instructions (Signed)
Medication Instructions:  Your physician recommends that you continue on your current medications as directed. Please refer to the Current Medication list given to you today.  *If you need a refill on your cardiac medications before your next appointment, please call your pharmacy*   Lab Work: TODAY: BMET, CBC, MAG  If you have labs (blood work) drawn today and your tests are completely normal, you will receive your results only by: . MyChart Message (if you have MyChart) OR . A paper copy in the mail If you have any lab test that is abnormal or we need to change your treatment, we will call you to review the results.   Follow-Up: At [facility], you and your health needs are our priority.  As part of our continuing mission to provide you with exceptional heart care, we have created designated Provider Care Teams.  These Care Teams include your primary Cardiologist (physician) and Advanced Practice Providers (APPs -  Physician Assistants and Nurse Practitioners) who all work together to provide you with the care you need, when you need it. Your next appointment:   6 month(s)  The format for your next appointment:   In Person  Provider:   Michael "Andy" Tillery, PA-C   

## 2020-06-11 NOTE — Progress Notes (Signed)
Electrophysiology Office Note Date: 06/11/2020  ID:  Marshell, Rieger Feb 04, 1951, MRN [id number]  PCP: Bedelia Person, MD Primary Cardiologist: Dina Rich, MD Electrophysiologist: Hillis Range, MD   CC: Routine ICD follow-up  Becky [name] is a 70 y.o. female seen today for Hillis Range, MD for routine electrophysiology followup.  Since last being seen in our clinic the patient reports doing about the same. She has chronic DOE with mild to moderate exertion in setting of her asthma.  she denies chest pain, palpitations, dyspnea, PND, orthopnea, nausea, vomiting, dizziness, syncope, edema, weight gain, or early satiety.    Device History: Medtronic BiV ICD implanted 05/2013, BiV 9/20115 , gen change 04/2019 for chronic systolic CHF, ischemic CMP, LBBB  Past Medical History:  Diagnosis Date  . AICD (automatic cardioverter/defibrillator) present   . Anxiety   . Arthritis    "all my joints; neck, back, legs, arms, elbows"  (10/03/2015)  . Asthma   . CAD (coronary artery disease)    a. 5/08: s/p DES to PDA and DES to LAD; b. 1/09: s/p DES to pLAD; c. 1/11: s/p DES to LAD x 2, d. LHC (3/12 in [place] - EF 55%, mild ant HK, nl LM, LAD stents ok, oLAD 30, RCA stents ok;  e.  4/13:  s/p DES to RCA, f. 1/14: dLM 20-30, mOM3 20-30, mLAD 80 ISR => s/p 2.5x16 mm Promus Element DES; EF 45-50%   . CHF (congestive heart failure) (HCC)   . Chronic bronchitis (HCC)   . Chronic lower back pain    "into pelvis region" (10/03/2015)  . COPD (chronic obstructive pulmonary disease) (HCC)   . Daily headache    "might be from sinus" (10/03/2015)  . Depression    hx (10/03/2015)  . Fibromyalgia   . GERD (gastroesophageal reflux disease)   . Heart murmur   . HLD (hyperlipidemia)   . Hypertension   . Ischemic cardiomyopathy    a. Echo (11/14):  EF 25%   . LBBB (left bundle branch block)   . Myocardial infarction (HCC) 12/2012  . OSA on CPAP   . Pneumonia 1950s  . Syncope 11/14    LifeVest placed  . Upper back pain, chronic    "into my shoulders; fibromyalgia works here" (10/03/2015)   Past Surgical History:  Procedure Laterality Date  . BI-VENTRICULAR IMPLANTABLE CARDIOVERTER DEFIBRILLATOR N/A 06/02/2013   Procedure: BI-VENTRICULAR IMPLANTABLE CARDIOVERTER DEFIBRILLATOR  (CRT-D);  Surgeon: Gardiner Rhyme, MD;  Location: [facility] CATH LAB;  Service: Cardiovascular;  Laterality: N/A;  . BI-VENTRICULAR IMPLANTABLE CARDIOVERTER DEFIBRILLATOR  (CRT-D)  06/02/2013   MDT VivaQuad CRTD implanted by Dr Johney Frame for ICM, CHF  . BIV ICD GENERATOR CHANGEOUT N/A 04/29/2019   Procedure: BIV ICD GENERATOR CHANGEOUT;  Surgeon: Hillis Range, MD;  Location: [facility] INVASIVE CV LAB;  Service: Cardiovascular;  Laterality: N/A;  . CARDIAC CATHETERIZATION  12/2012   S/P MI  . CORONARY ANGIOPLASTY WITH STENT PLACEMENT  2007; 2009; 2011   "?2 +1 +1 "  . CORONARY ANGIOPLASTY WITH STENT PLACEMENT  02/2012  . FRACTURE SURGERY    . HYSTEROSCOPY W/ ENDOMETRIAL ABLATION  ~ 1998  . LEAD REVISION  11-08-13   RA and LV lead revision by Dr Johney Frame  . LEAD REVISION Left 11/08/2013   Procedure: LEAD REVISION;  Surgeon: Gardiner Rhyme, MD;  Location: [facility] CATH LAB;  Service: Cardiovascular;  Laterality: Left;  . LEFT HEART CATHETERIZATION WITH CORONARY ANGIOGRAM N/A 01/03/2013   Procedure: LEFT HEART CATHETERIZATION  WITH CORONARY ANGIOGRAM;  Surgeon: Kathleene Hazel, MD;  Location: [facility] CATH LAB;  Service: Cardiovascular;  Laterality: N/A;  . ORIF ANKLE FRACTURE Right 10/21/2009  . TUBAL LIGATION  1989    Current Outpatient Medications  Medication Sig Dispense Refill  . acetaminophen-codeine (TYLENOL #3) 300-30 MG tablet Take 1 tablet by mouth every 6 (six) hours as needed for moderate pain.   2  . aspirin EC 81 MG tablet Take 1 tablet (81 mg total) by mouth daily.    . Aspirin-Salicylamide-Caffeine (BC HEADACHE PO) Take 1 packet by mouth daily as needed (pain.).     Marland Kitchen b complex vitamins capsule Take 1 capsule  by mouth daily.    . cetirizine (ZYRTEC) 10 MG tablet Take 10 mg by mouth at bedtime.   4  . cholecalciferol (VITAMIN D3) 25 MCG (1000 UNIT) tablet Take 1,000 Units by mouth daily.    . clopidogrel (PLAVIX) 75 MG tablet Take 75 mg by mouth daily.     Marland Kitchen escitalopram (LEXAPRO) 20 MG tablet Take 20 mg by mouth daily.     Marland Kitchen FARXIGA 5 MG TABS tablet Take 5 mg by mouth daily.    . fluticasone (FLONASE) 50 MCG/ACT nasal spray Place 2 sprays into both nostrils at bedtime.   7  . formoterol (PERFOROMIST) 20 MCG/2ML nebulizer solution Take 20 mcg by nebulization 2 (two) times daily.    . furosemide (LASIX) 40 MG tablet TAKE 1 TABLET BY MOUTH DAILY. MAY TAKE ADDITIONAL 1/2 TABLET AS NEEDED FOR SWELLING 90 tablet 3  . gabapentin (NEURONTIN) 300 MG capsule Take 300 mg by mouth 3 (three) times daily.     Marland Kitchen glipiZIDE (GLUCOTROL XL) 5 MG 24 hr tablet Take 5 mg by mouth daily.    Marland Kitchen ipratropium-albuterol (DUONEB) 0.5-2.5 (3) MG/3ML SOLN Take 3 mLs by nebulization every 6 (six) hours as needed (shortness of breath).    . lactulose (CHRONULAC) 10 GM/15ML solution Take 10 g by mouth 2 (two) times daily as needed for mild constipation.    . magnesium oxide (MAG-OX) 400 MG tablet Take 400 mg by mouth daily.    . metFORMIN (GLUCOPHAGE) 500 MG tablet Take 1,000 mg by mouth 2 (two) times daily with a meal.     . metoprolol succinate (TOPROL-XL) 50 MG 24 hr tablet TAKE 1 TABLET BY MOUTH DAILY WITH OR IMMEDIATELY FOLLOWING A MEAL 90 tablet 3  . montelukast (SINGULAIR) 10 MG tablet Take 10 mg by mouth at bedtime.     . nitroGLYCERIN (NITROSTAT) 0.4 MG SL tablet Place 1 tablet (0.4 mg total) under the tongue every 5 (five) minutes x 3 doses as needed. If no relief after 3rd dose, proceed to ED 25 tablet 5  . pantoprazole (PROTONIX) 40 MG tablet TAKE 1 TABLET(40 MG) BY MOUTH DAILY 90 tablet 2  . pravastatin (PRAVACHOL) 80 MG tablet TAKE 1 TABLET(80 MG) BY MOUTH EVERY EVENING 90 tablet 2  . rOPINIRole (REQUIP) 0.5 MG tablet  Take 1 mg by mouth at bedtime.     Marland Kitchen spironolactone (ALDACTONE) 25 MG tablet TAKE 1/2 TABLET(12.5 MG) BY MOUTH DAILY 45 tablet 3  . VENTOLIN HFA 108 (90 Base) MCG/ACT inhaler Inhale 1-2 puffs into the lungs every 6 (six) hours as needed for wheezing or shortness of breath.     . benzonatate (TESSALON) 100 MG capsule Take 100 mg by mouth as needed.     No current facility-administered medications for this visit.    Allergies:   Tape, Lipitor [  atorvastatin], Prozac [fluoxetine hcl], Codeine, Fosamax [alendronate sodium], and Keflex [cephalexin]   Social History: Social History   Socioeconomic History  . Marital status: Married    Spouse name: Not on file  . Number of children: Not on file  . Years of education: Not on file  . Highest education level: Not on file  Occupational History  . Not on file  Tobacco Use  . Smoking status: Current Every Day Smoker    Packs/day: 0.50    Years: 20.00    Pack years: 10.00    Types: Cigarettes    Start date: 03/09/1991  . Smokeless tobacco: Never Used  . Tobacco comment: 4-5 packs per week  Substance and Sexual Activity  . Alcohol use: Yes    Alcohol/week: 0.0 standard drinks    Comment: 816/2017  "lmight have a few drinks a year"  . Drug use: No  . Sexual activity: Yes  Other Topics Concern  . Not on file  Social History Narrative   Lives in [place]   Social Determinants of Health   Financial Resource Strain: Not on file  Food Insecurity: Not on file  Transportation Needs: Not on file  Physical Activity: Not on file  Stress: Not on file  Social Connections: Not on file  Intimate Partner Violence: Not on file    Family History: Family History  Problem Relation Age of Onset  . Breast cancer Mother   . Heart failure Brother   . Breast cancer Sister   . Breast cancer Sister   . Uterine cancer Sister     Review of Systems: All other systems reviewed and are otherwise negative except as noted above.   Physical  Exam: Vitals:   06/11/20 0841  BP: (!) 150/72  Pulse: 72  SpO2: 94%  Weight: 177 lb (80.3 kg)  Height: 5' (1.524 m)     GEN- The patient is well appearing, alert and oriented x 3 today.   HEENT: normocephalic, atraumatic; sclera clear, conjunctiva pink; hearing intact; oropharynx clear; neck supple, no JVP Lymph- no cervical lymphadenopathy Lungs- Clear to ausculation bilaterally, normal work of breathing.  No wheezes, rales, rhonchi Heart- Regular rate and rhythm, no murmurs, rubs or gallops, PMI not laterally displaced GI- soft, non-tender, non-distended, bowel sounds present, no hepatosplenomegaly Extremities- no clubbing or cyanosis. No edema; DP/PT/radial pulses 2+ bilaterally MS- no significant deformity or atrophy Skin- warm and dry, no rash or lesion; ICD pocket well healed Psych- euthymic mood, full affect Neuro- strength and sensation are intact  ICD interrogation- reviewed in detail today,  See PACEART report  EKG:  EKG is ordered today. The ekg ordered today shows BiV paced at 72 bpm with QRS 124 ms.   Recent Labs: No results found for requested labs within last 8760 hours.   Wt Readings from Last 3 Encounters:  06/11/20 177 lb (80.3 kg)  05/16/20 183 lb (83 kg)  02/03/20 190 lb 9.6 oz (86.5 kg)     Other studies Reviewed: Additional studies/ records that were reviewed today include: Previous EP notes and remotes.   Assessment and Plan:  1.  Chronic systolic dysfunction s/p Medtronic CRT-D  euvolemic today Stable on an appropriate medical regimen Normal ICD function See Pace Art report No changes today CRT pacing ~98% of the time, with around 98% of that being LV only. "Effective pacing" appears to be in 72-80% range since gen change. Pt is V-V optimized at current settings. At times she is having HRs in the  130s that appear to be AT vs Sinus tach; no binning in that range. ? If these intermittent high rates are what is causing her pacing to be "less  effective". Suspect mostly from deconditioning confounded further by her Asthma.  LV3 to LV1 and LV3 to LV4 are both reasonable alternatives if needed in the future. Vectors involving LV2 frequently had stim at higher thresholds. Stable at current settings. See media for [employer] summary.   2. OSA Have encouraged nightly CPAP use  3. HTN Continue current regimen  Current medicines are reviewed at length with the patient today.   The patient does not have concerns regarding her medicines.  The following changes were made today:  none  Labs/ tests ordered today include:  Orders Placed This Encounter  Procedures  . Basic metabolic panel  . Magnesium  . CBC  . EKG 12-Lead     Disposition:   Follow up with EP APP in 6 months   Signed, Graciella Freer, PA-C  06/11/2020 11:09 AM  [facility] [address] [place] [address] [phone] (office) [phone] (fax)

## 2020-06-13 NOTE — Progress Notes (Signed)
EPIC Encounter for ICM Monitoring  Patient Name: Becky [name] is a 70 y.o. female Date: 06/13/2020 Primary Care Physican: Bedelia Person, MD Primary Cardiologist:Branch Electrophysiologist: Allred LV Pacing:97.1% 4/27/2022Weight: 175 lbs  Time in AT/AF0.0 hr/day (0.0%)   Spoke with patient and reports feeling well at this time.  Denies fluid symptoms.  Pt requested latest labs to be sent to PCP Dr Clifton Custard and copy faxed via Epic  OptivolThoracic impedancenormal.   Prescribed:Furosemide 40 mgTake 1 tablet (40 mg) by mouth scheduled in the morning, may take an additional 0.5 tablet (20 mg) if needed for swelling.  Labs: 08/10/2019 Creatinine 1.58, BUN 18, Potassium 3.8, Sodium 136, GFR 33-38 04/26/2019 Creatinine1.76, BUN16, Potassium4.5, Sodium143, EVO35-00 A complete set of results can be found in Results Review.  Recommendations: No changes and encouraged to call if experiencing any fluid symptoms.  Follow-up plan: ICM clinic phone appointment on6/07/2020.91 day device clinic remote transmission scheduled6/22/2022.   EP/Cardiology Office Visits: Recall 9/26/2022with Dr.Branch.Recall 10/22/2022with Dr Johney Frame.   Copy of ICM check sent to Dr.Allred.  3 month ICM trend: 06/11/2020.    1 Year ICM trend:       Karie Soda, RN 06/13/2020 11:09 AM

## 2020-07-06 ENCOUNTER — Telehealth: Payer: Self-pay | Admitting: Cardiology

## 2020-07-06 NOTE — Telephone Encounter (Signed)
   [facility] Pre-operative Risk Assessment    Patient Name: Becky [name]  DOB: 27-Apr-1950  MRN: [id number]   HEARTCARE STAFF: - Please ensure there is not already an duplicate clearance open for this procedure. - Under Visit Info/Reason for Call, type in Other and utilize the format Clearance MM/DD/YY or Clearance TBD. Do not use dashes or single digits. - If request is for dental extraction, please clarify the # of teeth to be extracted.  Request for surgical clearance:  1. What type of surgery is being performed?  Extraction of one tooth  2. When is this surgery scheduled? 08/30/2020  3. What type of clearance is required (medical clearance vs. Pharmacy clearance to hold med vs. Both)? Pharmacy   4. Are there any medications that need to be held prior to surgery and how long?blood thinner  5. Practice name and name of physician performing surgery? [facility]  6. What is the office phone number? [phone]   7.   What is the office fax number? [phone]  8.   Anesthesia type (None, local, MAC, general) ? None noted    Chanda Busing 07/06/2020, 9:36 AM  _________________________________________________________________   (provider comments below)

## 2020-07-06 NOTE — Telephone Encounter (Signed)
   Primary Cardiologist: Dina Rich, MD  Chart reviewed as part of pre-operative protocol coverage. Simple dental extractions are considered low risk procedures per guidelines and generally do not require any specific cardiac clearance. It is also generally accepted that for simple extractions and dental cleanings, there is no need to interrupt blood thinner therapy.   SBE prophylaxis is not required for the patient.  I will route this recommendation to the requesting party via Epic fax function and remove from pre-op pool.  Please call with questions.  Ronney Asters, NP 07/06/2020, 9:44 AM

## 2020-07-26 ENCOUNTER — Telehealth: Payer: Self-pay

## 2020-07-26 NOTE — Telephone Encounter (Signed)
LMOVM for patient to send missed ICM transmission. 

## 2020-07-27 ENCOUNTER — Telehealth: Payer: Self-pay

## 2020-07-27 NOTE — Telephone Encounter (Signed)
The patient called to get help with her monitor. I called [employer] to get additional help. They are sending her a new power cord. She should receive it in 7-10 business days.

## 2020-07-27 NOTE — Progress Notes (Signed)
No ICM remote transmission received for 07/23/2020 and next ICM transmission scheduled for 08/06/2020.    

## 2020-08-08 ENCOUNTER — Other Ambulatory Visit: Payer: Self-pay

## 2020-08-09 ENCOUNTER — Telehealth: Payer: Self-pay

## 2020-08-09 NOTE — Telephone Encounter (Signed)
The patient states she still have not received her new power cord from [employer].

## 2020-08-10 NOTE — Progress Notes (Signed)
No ICM remote transmission received for 08/06/2020 and next ICM transmission scheduled for 08/14/2020.

## 2020-08-13 ENCOUNTER — Telehealth: Payer: Self-pay

## 2020-08-13 NOTE — Telephone Encounter (Signed)
I called the patient with [employer] on the phone. I explained to her that [employer] did not order her power cord. The rep did order the power cord today and she should receive it by Wednesday or Thursday. The patient states she will call when she receive it to see if the transmission goes thru.

## 2020-08-14 NOTE — Telephone Encounter (Signed)
Patient called back and states she has gotten the power cord but the monitor wont turn on. We called tech support and they are sending her a new monitor and they dont have a ETA on it as they are on backorder. Patient will call if she has any symptoms as she may need to come in and be seen since she does not have a working monitor

## 2020-08-14 NOTE — Telephone Encounter (Signed)
Returned patient call as requested per voice mail option.  She wanted to let me know her new monitor is on back order but unsure when she may receive it.  She asked if she needs to have a device check can she go to the [place] office.  Advised she would need to make an appt for a Friday when Dr Johney Frame is in that office.  Advised if she develops fluid symptoms before she receives a new monitor to call the office. She will call when she receives a new monitor to resume monthly ICM follow up.

## 2020-08-16 ENCOUNTER — Telehealth: Payer: Self-pay

## 2020-08-16 NOTE — Telephone Encounter (Signed)
Error

## 2020-08-16 NOTE — Telephone Encounter (Signed)
Attempted to contact patient. No answer. Left HIPPA compliant VM stating that unfortunately we did not offer a device clinic in [place] but we do in [place]. If she needs to see Dr. Johney Frame he does come to the [place] office about once  month. Requested patient call the device clinic when her monitor arrives so we can assure she gets it connected. Direct phone number left sall further questions or concerns arise.

## 2020-08-21 ENCOUNTER — Other Ambulatory Visit: Payer: Self-pay | Admitting: Cardiology

## 2020-08-22 ENCOUNTER — Telehealth: Payer: Self-pay

## 2020-08-22 NOTE — Telephone Encounter (Signed)
NOTES ON FILE FROM  MARL MAHONEY DO [phone] SENT REFERRAL TO SCHEDULING

## 2020-08-23 NOTE — Telephone Encounter (Signed)
LMOVM to follow up to see if patient received her new monitor.

## 2020-08-23 NOTE — Telephone Encounter (Signed)
Pt called to let me know she has not received her new monitor. I told her I will check with her next week.

## 2020-08-30 ENCOUNTER — Telehealth: Payer: Self-pay

## 2020-08-30 NOTE — Telephone Encounter (Signed)
Ardine Eng wants you to try to offer the patient transportation to see if she would come to the appointment with Dr. Johney Frame or app. Her phone number is [phone].

## 2020-08-30 NOTE — Telephone Encounter (Signed)
The patient called leaving a message on the phone upset because we are calling her nagging about the monitor. I returned the patient phone call letting her know the last time I called her was on 08-23-2020 and put a note in her chart that she has not received her monitor. I told her it looks like the scheduler tried to call and set up an appointment with Dr. Johney Frame or [facility]. She states it is no need for anyone to call her for an appointment because she can not come at this time. Her and her husband cursed me out because she has not received her new monitor she ordered a month ago. I apologized to them and let them know that the monitors are on backorder and the most I can do is call [employer] to see if there is a tracking on the monitor or an estimate on when she will receive the monitor. She ask me to do that so she can know.   I spoke with [employer] tech support to ask how long will the patient has to wait. He told me there is a 3 month wait on the monitors. I conference the patient and let the [employer] rep explain to her that the monitors will take 3 months before she will receive it. She made the comment that she does not want anyone to call her about an appointment or to ask her to come to the office because she can not come to the office and do not have a monitor.

## 2020-08-30 NOTE — Telephone Encounter (Signed)
See 08/30/2020 phone note.

## 2020-08-30 NOTE — Telephone Encounter (Signed)
Patient is agreeable to see Dr. Johney Frame in the [place] office without transportation. Patient is able to get a ride from family when the appointment is in [place]. Appointment made.

## 2020-09-06 ENCOUNTER — Other Ambulatory Visit: Payer: Self-pay | Admitting: Cardiology

## 2020-09-19 ENCOUNTER — Other Ambulatory Visit: Payer: Self-pay | Admitting: Cardiology

## 2020-09-21 ENCOUNTER — Ambulatory Visit (INDEPENDENT_AMBULATORY_CARE_PROVIDER_SITE_OTHER): Payer: Medicare Other | Admitting: Internal Medicine

## 2020-09-21 ENCOUNTER — Other Ambulatory Visit: Payer: Self-pay

## 2020-09-21 VITALS — BP 140/64 | HR 76 | Ht 60.0 in | Wt 181.2 lb

## 2020-09-21 DIAGNOSIS — I255 Ischemic cardiomyopathy: Secondary | ICD-10-CM | POA: Diagnosis not present

## 2020-09-21 DIAGNOSIS — I5022 Chronic systolic (congestive) heart failure: Secondary | ICD-10-CM | POA: Diagnosis not present

## 2020-09-21 DIAGNOSIS — I10 Essential (primary) hypertension: Secondary | ICD-10-CM

## 2020-09-21 NOTE — Patient Instructions (Addendum)
Medication Instructions:  Continue all current medications.  Labwork: none  Testing/Procedures: none  Follow-Up: 1 year   Any Other Special Instructions Will Be Listed Below (If Applicable).  If you need a refill on your cardiac medications before your next appointment, please call your pharmacy.  

## 2020-09-21 NOTE — Progress Notes (Signed)
PCP: Bedelia Person, MD Primary Cardiologist: Dr Wyline Mood Primary EP: Dr Lafayette Dragon MICHILLE MCELRATH is a 70 y.o. female who presents today for routine electrophysiology followup.  Since last being seen in our clinic, the patient reports doing very well.  Today, she denies symptoms of palpitations, chest pain, shortness of breath,  lower extremity edema, dizziness, presyncope, syncope, or ICD shocks.  The patient is otherwise without complaint today.   Past Medical History:  Diagnosis Date   AICD (automatic cardioverter/defibrillator) present    Anxiety    Arthritis    "all my joints; neck, back, legs, arms, elbows"  (10/03/2015)   Asthma    CAD (coronary artery disease)    a. 5/08: s/p DES to PDA and DES to LAD; b. 1/09: s/p DES to pLAD; c. 1/11: s/p DES to LAD x 2, d. LHC (3/12 in [place] - EF 55%, mild ant HK, nl LM, LAD stents ok, oLAD 30, RCA stents ok;  e.  4/13:  s/p DES to RCA, f. 1/14: dLM 20-30, mOM3 20-30, mLAD 80 ISR => s/p 2.5x16 mm Promus Element DES; EF 45-50%    CHF (congestive heart failure) (HCC)    Chronic bronchitis (HCC)    Chronic lower back pain    "into pelvis region" (10/03/2015)   COPD (chronic obstructive pulmonary disease) (HCC)    Daily headache    "might be from sinus" (10/03/2015)   Depression    hx (10/03/2015)   Fibromyalgia    GERD (gastroesophageal reflux disease)    Heart murmur    HLD (hyperlipidemia)    Hypertension    Ischemic cardiomyopathy    a. Echo (11/14):  EF 25%    LBBB (left bundle branch block)    Myocardial infarction (HCC) 12/2012   OSA on CPAP    Pneumonia 1950s   Syncope 11/14   LifeVest placed   Upper back pain, chronic    "into my shoulders; fibromyalgia works here" (10/03/2015)   Past Surgical History:  Procedure Laterality Date   BI-VENTRICULAR IMPLANTABLE CARDIOVERTER DEFIBRILLATOR N/A 06/02/2013   Procedure: BI-VENTRICULAR IMPLANTABLE CARDIOVERTER DEFIBRILLATOR  (CRT-D);  Surgeon: Gardiner Rhyme, MD;  Location: [facility] CATH  LAB;  Service: Cardiovascular;  Laterality: N/A;   BI-VENTRICULAR IMPLANTABLE CARDIOVERTER DEFIBRILLATOR  (CRT-D)  06/02/2013   MDT VivaQuad CRTD implanted by Dr Johney Frame for ICM, CHF   BIV ICD GENERATOR CHANGEOUT N/A 04/29/2019   Procedure: BIV ICD GENERATOR CHANGEOUT;  Surgeon: Hillis Range, MD;  Location: [facility] INVASIVE CV LAB;  Service: Cardiovascular;  Laterality: N/A;   CARDIAC CATHETERIZATION  12/2012   S/P MI   CORONARY ANGIOPLASTY WITH STENT PLACEMENT  2007; 2009; 2011   "?2 +1 +1 "   CORONARY ANGIOPLASTY WITH STENT PLACEMENT  02/2012   FRACTURE SURGERY     HYSTEROSCOPY W/ ENDOMETRIAL ABLATION  ~ 1998   LEAD REVISION  11-08-13   RA and LV lead revision by Dr Johney Frame   LEAD REVISION Left 11/08/2013   Procedure: LEAD REVISION;  Surgeon: Gardiner Rhyme, MD;  Location: [facility] CATH LAB;  Service: Cardiovascular;  Laterality: Left;   LEFT HEART CATHETERIZATION WITH CORONARY ANGIOGRAM N/A 01/03/2013   Procedure: LEFT HEART CATHETERIZATION WITH CORONARY ANGIOGRAM;  Surgeon: Kathleene Hazel, MD;  Location: [facility] CATH LAB;  Service: Cardiovascular;  Laterality: N/A;   ORIF ANKLE FRACTURE Right 10/21/2009   TUBAL LIGATION  1989    ROS- all systems are reviewed and negative except as per HPI above  Current Outpatient Medications  Medication Sig Dispense Refill   acetaminophen-codeine (TYLENOL #3) 300-30 MG tablet Take 1 tablet by mouth every 6 (six) hours as needed for moderate pain.   2   aspirin EC 81 MG tablet Take 1 tablet (81 mg total) by mouth daily.     Aspirin-Salicylamide-Caffeine (BC HEADACHE PO) Take 1 packet by mouth daily as needed (pain.).      b complex vitamins capsule Take 1 capsule by mouth daily.     benzonatate (TESSALON) 100 MG capsule Take 100 mg by mouth as needed.     cetirizine (ZYRTEC) 10 MG tablet Take 10 mg by mouth at bedtime.   4   cholecalciferol (VITAMIN D3) 25 MCG (1000 UNIT) tablet Take 1,000 Units by mouth daily.     clopidogrel (PLAVIX) 75 MG tablet Take 75 mg  by mouth daily.      escitalopram (LEXAPRO) 20 MG tablet Take 20 mg by mouth daily.      FARXIGA 5 MG TABS tablet Take 5 mg by mouth daily.     fluticasone (FLONASE) 50 MCG/ACT nasal spray Place 2 sprays into both nostrils at bedtime.   7   formoterol (PERFOROMIST) 20 MCG/2ML nebulizer solution Take 20 mcg by nebulization 2 (two) times daily.     furosemide (LASIX) 40 MG tablet TAKE 1 TABLET BY MOUTH DAILY. MAY TAKE ADDITIONAL 1/2 TABLET AS NEEDED FOR SWELLING 90 tablet 3   gabapentin (NEURONTIN) 300 MG capsule Take 300 mg by mouth 3 (three) times daily.      glipiZIDE (GLUCOTROL XL) 5 MG 24 hr tablet Take 5 mg by mouth daily.     ipratropium-albuterol (DUONEB) 0.5-2.5 (3) MG/3ML SOLN Take 3 mLs by nebulization every 6 (six) hours as needed (shortness of breath).     lactulose (CHRONULAC) 10 GM/15ML solution Take 10 g by mouth 2 (two) times daily as needed for mild constipation.     magnesium oxide (MAG-OX) 400 MG tablet Take 400 mg by mouth daily.     metFORMIN (GLUCOPHAGE) 500 MG tablet Take 1,000 mg by mouth 2 (two) times daily with a meal.      metoprolol succinate (TOPROL-XL) 50 MG 24 hr tablet TAKE 1 TABLET BY MOUTH DAILY WITH OR IMMEDIATELY FOLLOWING A MEAL 90 tablet 3   montelukast (SINGULAIR) 10 MG tablet Take 10 mg by mouth at bedtime.      nitroGLYCERIN (NITROSTAT) 0.4 MG SL tablet Place 1 tablet (0.4 mg total) under the tongue every 5 (five) minutes x 3 doses as needed. If no relief after 3rd dose, proceed to ED 25 tablet 5   pantoprazole (PROTONIX) 40 MG tablet TAKE 1 TABLET(40 MG) BY MOUTH DAILY 90 tablet 2   pravastatin (PRAVACHOL) 80 MG tablet TAKE 1 TABLET(80 MG) BY MOUTH EVERY EVENING 90 tablet 2   rOPINIRole (REQUIP) 0.5 MG tablet Take 1 mg by mouth at bedtime.      spironolactone (ALDACTONE) 25 MG tablet TAKE 1/2 TABLET(12.5 MG) BY MOUTH DAILY 45 tablet 3   VENTOLIN HFA 108 (90 Base) MCG/ACT inhaler Inhale 1-2 puffs into the lungs every 6 (six) hours as needed for wheezing or  shortness of breath.      No current facility-administered medications for this visit.    Physical Exam: Vitals:   09/21/20 0949  BP: 140/64  Pulse: 76  SpO2: 92%  Weight: 181 lb 3.2 oz (82.2 kg)  Height: 5' (1.524 m)    GEN- The patient is well appearing, alert and oriented x 3 today.  Head- normocephalic, atraumatic Eyes-  Sclera clear, conjunctiva pink Ears- hearing intact Oropharynx- clear Lungs- Clear to ausculation bilaterally, normal work of breathing Chest- ICD pocket is well healed Heart- Regular rate and rhythm, no murmurs, rubs or gallops, PMI not laterally displaced GI- soft, NT, ND, + BS Extremities- no clubbing, cyanosis, or edema  ICD interrogation- reviewed in detail today,  See PACEART report  ekg tracing ordered today is personally reviewed and shows sinus with Biv pacing, PVCs  Wt Readings from Last 3 Encounters:  09/21/20 181 lb 3.2 oz (82.2 kg)  06/11/20 177 lb (80.3 kg)  05/16/20 183 lb (83 kg)    Assessment and Plan:  1.  Chronic systolic dysfunction/ ischemic CM/ LBBB euvolemic today Ef has recovered with CRT Stable on an appropriate medical regimen Normal ICD function See Arita Miss Art report No changes today she is not device dependant today followed in ICM device clinic She had device optimization 06/11/20 Mardelle Matte Tillery's note reviewed)  2. HTN Continue spironolactone 12.5mg  daily  3. OSA Compliance with CPAP is advised  4. HL Muscle aches today    Risks, benefits and potential toxicities for medications prescribed and/or refilled reviewed with patient today.   Return in a year  Hillis Range MD, [facility] 09/21/2020 10:06 AM

## 2020-09-26 ENCOUNTER — Telehealth: Payer: Self-pay

## 2020-09-26 ENCOUNTER — Ambulatory Visit (INDEPENDENT_AMBULATORY_CARE_PROVIDER_SITE_OTHER): Payer: Medicare Other

## 2020-09-26 DIAGNOSIS — I255 Ischemic cardiomyopathy: Secondary | ICD-10-CM

## 2020-09-26 NOTE — Telephone Encounter (Signed)
Attempted return call to pt per voice mail request.  She received new home monitor and wanted to confirm a transmission was received.  Left message confirming report was received and will attempt to call back on 8/12.

## 2020-09-27 LAB — CUP PACEART REMOTE DEVICE CHECK
Battery Remaining Longevity: 85 mo
Battery Voltage: 2.93 V
Brady Statistic AP VP Percent: 41.55 %
Brady Statistic AP VS Percent: 0.67 %
Brady Statistic AS VP Percent: 55.25 %
Brady Statistic AS VS Percent: 2.53 %
Brady Statistic RA Percent Paced: 41.01 %
Brady Statistic RV Percent Paced: 0.65 %
Date Time Interrogation Session: 20220810131812
HighPow Impedance: 95 Ohm
Implantable Lead Implant Date: 20150416
Implantable Lead Implant Date: 20150922
Implantable Lead Implant Date: 20150922
Implantable Lead Location: 753858
Implantable Lead Location: 753859
Implantable Lead Location: 753860
Implantable Lead Model: 4598
Implantable Lead Model: 5076
Implantable Lead Model: 6935
Implantable Pulse Generator Implant Date: 20210312
Lead Channel Impedance Value: 151.406
Lead Channel Impedance Value: 161.5 Ohm
Lead Channel Impedance Value: 172.5 Ohm
Lead Channel Impedance Value: 185.725
Lead Channel Impedance Value: 185.725
Lead Channel Impedance Value: 266 Ohm
Lead Channel Impedance Value: 285 Ohm
Lead Channel Impedance Value: 323 Ohm
Lead Channel Impedance Value: 323 Ohm
Lead Channel Impedance Value: 342 Ohm
Lead Channel Impedance Value: 342 Ohm
Lead Channel Impedance Value: 437 Ohm
Lead Channel Impedance Value: 437 Ohm
Lead Channel Impedance Value: 551 Ohm
Lead Channel Impedance Value: 551 Ohm
Lead Channel Impedance Value: 646 Ohm
Lead Channel Impedance Value: 646 Ohm
Lead Channel Impedance Value: 703 Ohm
Lead Channel Pacing Threshold Amplitude: 0.875 V
Lead Channel Pacing Threshold Amplitude: 1.125 V
Lead Channel Pacing Threshold Amplitude: 1.5 V
Lead Channel Pacing Threshold Pulse Width: 0.4 ms
Lead Channel Pacing Threshold Pulse Width: 0.4 ms
Lead Channel Pacing Threshold Pulse Width: 0.4 ms
Lead Channel Sensing Intrinsic Amplitude: 10.5 mV
Lead Channel Sensing Intrinsic Amplitude: 10.5 mV
Lead Channel Sensing Intrinsic Amplitude: 3.875 mV
Lead Channel Sensing Intrinsic Amplitude: 3.875 mV
Lead Channel Setting Pacing Amplitude: 1.75 V
Lead Channel Setting Pacing Amplitude: 2.25 V
Lead Channel Setting Pacing Amplitude: 2.5 V
Lead Channel Setting Pacing Pulse Width: 0.4 ms
Lead Channel Setting Pacing Pulse Width: 0.4 ms
Lead Channel Setting Sensing Sensitivity: 0.3 mV

## 2020-09-28 ENCOUNTER — Telehealth: Payer: Self-pay

## 2020-09-28 ENCOUNTER — Ambulatory Visit (INDEPENDENT_AMBULATORY_CARE_PROVIDER_SITE_OTHER): Payer: Medicare Other

## 2020-09-28 DIAGNOSIS — Z95810 Presence of automatic (implantable) cardiac defibrillator: Secondary | ICD-10-CM

## 2020-09-28 DIAGNOSIS — I5022 Chronic systolic (congestive) heart failure: Secondary | ICD-10-CM

## 2020-09-28 NOTE — Telephone Encounter (Signed)
Remote ICM transmission received.  Attempted call to patient regarding ICM remote transmission and left detailed message per DPR.  Advised to return call for any fluid symptoms or questions. Next ICM remote transmission scheduled 10/30/2020.    

## 2020-09-28 NOTE — Progress Notes (Signed)
EPIC Encounter for ICM Monitoring  Patient Name: Becky [name] is a 70 y.o. female Date: 09/28/2020 Primary Care Physican: Bedelia Person, MD Primary Cardiologist: Branch Electrophysiologist: Allred LV Pacing:  94.7%      09/21/2020 Office Weight: 181 lbs   Time in AT/AF  0.0 hr/day (0.0%)     Attempted call to patient and unable to reach.  Left detailed message per DPR regarding transmission. Transmission reviewed.  Pt received new monitor.   Optivol Thoracic impedance suggesting normal fluid levels    Prescribed: Furosemide 40 mg Take 1 tablet (40 mg) by mouth scheduled in the morning, may take an additional 0.5 tablet (20 mg) if needed for swelling.   Labs: 08/10/2019 Creatinine 1.58, BUN 18, Potassium 3.8, Sodium 136, GFR 33-38 04/26/2019 Creatinine 1.76, BUN 16, Potassium 4.5, Sodium 143, GFR 29-34 A complete set of results can be found in Results Review.   Recommendations: Left voice mail with ICM number and encouraged to call if experiencing any fluid symptoms.   Follow-up plan: ICM clinic phone appointment on 10/30/2020.  91 day device clinic remote transmission scheduled 12/26/2020.     EP/Cardiology Office Visits:  12/04/2020 with Dr. Wyline Mood.    Last visit with Dr Johney Frame was 09/21/2020 (no recall for 2023).     Copy of ICM check sent to Dr. Johney Frame.   3 month ICM trend: 09/28/2020.    1 Year ICM trend:       Karie Soda, RN 09/28/2020 10:58 AM

## 2020-10-05 ENCOUNTER — Telehealth: Payer: Self-pay

## 2020-10-05 NOTE — Telephone Encounter (Signed)
Attempted return call to patient per voice mail request regarding she was discharged from hospital following gall bladder surgery.  Left message to call back if experiencing any fluid symptoms.

## 2020-10-09 ENCOUNTER — Telehealth: Payer: Self-pay

## 2020-10-09 NOTE — Telephone Encounter (Signed)
Received a call from patient.  She sent a remote transmission to review fluid levels since having gall bladder surgery.  She the surgery was open technique and they were unable to take out gallbladder laparoscopically.    10/09/2020 Optivol thoracic impedance suggesting possible fluid accumulation from 8/13 - 8/18 which correlates with surgery.      She is recovering from surgery and has no fluid symptoms at this time.  Next ICM remote transmission scheduled for 10/30/2020.  Encouraged to call if she has changes in condition.

## 2020-10-19 NOTE — Progress Notes (Signed)
Remote ICD transmission.   

## 2020-10-30 ENCOUNTER — Ambulatory Visit (INDEPENDENT_AMBULATORY_CARE_PROVIDER_SITE_OTHER): Payer: Medicare Other

## 2020-10-30 DIAGNOSIS — Z95810 Presence of automatic (implantable) cardiac defibrillator: Secondary | ICD-10-CM | POA: Diagnosis not present

## 2020-10-30 DIAGNOSIS — I5022 Chronic systolic (congestive) heart failure: Secondary | ICD-10-CM | POA: Diagnosis not present

## 2020-11-02 NOTE — Progress Notes (Signed)
EPIC Encounter for ICM Monitoring  Patient Name: Becky [name] is a 70 y.o. female Date: 11/02/2020 Primary Care Physican: Bedelia Person, MD Primary Cardiologist: Branch Electrophysiologist: Allred LV Pacing:  97%      11/02/2020 Weight: 162 lbs   Time in AT/AF  0.0 hr/day (0.0%)     Spoke with patient and heart failure questions reviewed.  Pt asymptomatic for fluid accumulation and feeling well.   Optivol Thoracic impedance normal fluid levels but was suggesting possible fluid accumulation from 8/28-9/7.   Prescribed: Furosemide 40 mg Take 1 tablet (40 mg) by mouth scheduled in the morning, may take an additional 0.5 tablet (20 mg) if needed for swelling.   Labs: 06/11/2020 Creatinine 1.80, BUN 14, Potassium 3.9, Sodium 140, GFR 30 A complete set of results can be found in Results Review.   Recommendations:  No changes and encouraged to call if experiencing any fluid symptoms.   Follow-up plan: ICM clinic phone appointment on 12/03/2020.  91 day device clinic remote transmission scheduled 12/26/2020.     EP/Cardiology Office Visits:  12/04/2020 with Dr. Wyline Mood.      Copy of ICM check sent to Dr. Johney Frame.    3 month ICM trend: 11/02/2020.    1 Year ICM trend:       Karie Soda, RN 11/02/2020 10:16 AM

## 2020-11-18 ENCOUNTER — Other Ambulatory Visit: Payer: Self-pay | Admitting: Cardiology

## 2020-12-03 ENCOUNTER — Ambulatory Visit (INDEPENDENT_AMBULATORY_CARE_PROVIDER_SITE_OTHER): Payer: Medicare Other

## 2020-12-03 DIAGNOSIS — I5022 Chronic systolic (congestive) heart failure: Secondary | ICD-10-CM | POA: Diagnosis not present

## 2020-12-03 DIAGNOSIS — Z95810 Presence of automatic (implantable) cardiac defibrillator: Secondary | ICD-10-CM

## 2020-12-03 NOTE — Progress Notes (Signed)
EPIC Encounter for ICM Monitoring  Patient Name: Becky [name] is a 70 y.o. female Date: 12/03/2020 Primary Care Physican: Bedelia Person, MD Primary Cardiologist: Branch Electrophysiologist: Allred LV Pacing:  94.5%      12/03/2020 Weight: 162 lbs   Time in AT/AF  0.0 hr/day (0.0%)     Spoke with patient and heart failure questions reviewed.  Pt asymptomatic for fluid accumulation and feeling well.  She reports good urine output.   Optivol Thoracic impedance suggesting possible fluid starting 10/9 and trending back toward baseline normal.   Prescribed: Furosemide 40 mg Take 1 tablet (40 mg) by mouth scheduled in the morning, may take an additional 0.5 tablet (20 mg) if needed for swelling.   Labs: 06/11/2020 Creatinine 1.80, BUN 14, Potassium 3.9, Sodium 140, GFR 30 A complete set of results can be found in Results Review.   Recommendations:   She has OV with Dr Wyline Mood tomorrow and recommendations will be given at that time.   Follow-up plan: ICM clinic phone appointment on 12/10/2020 (manual) to recheck fluid levels.  91 day device clinic remote transmission scheduled 12/26/2020.     EP/Cardiology Office Visits:  12/04/2020 with Dr. Wyline Mood.      Copy of ICM check sent to Dr. Johney Frame and Dr Wyline Mood as Lorain Childes.    3 month ICM trend: 12/03/2020.    1 Year ICM trend:       Karie Soda, RN 12/03/2020 2:18 PM

## 2020-12-04 ENCOUNTER — Ambulatory Visit (INDEPENDENT_AMBULATORY_CARE_PROVIDER_SITE_OTHER): Payer: Medicare Other | Admitting: Cardiology

## 2020-12-04 ENCOUNTER — Encounter: Payer: Self-pay | Admitting: *Deleted

## 2020-12-04 ENCOUNTER — Encounter: Payer: Self-pay | Admitting: Cardiology

## 2020-12-04 VITALS — BP 138/70 | HR 78 | Ht 60.0 in | Wt 167.0 lb

## 2020-12-04 DIAGNOSIS — I10 Essential (primary) hypertension: Secondary | ICD-10-CM | POA: Diagnosis not present

## 2020-12-04 DIAGNOSIS — E782 Mixed hyperlipidemia: Secondary | ICD-10-CM

## 2020-12-04 DIAGNOSIS — I5022 Chronic systolic (congestive) heart failure: Secondary | ICD-10-CM | POA: Diagnosis not present

## 2020-12-04 DIAGNOSIS — I255 Ischemic cardiomyopathy: Secondary | ICD-10-CM | POA: Diagnosis not present

## 2020-12-04 NOTE — Patient Instructions (Signed)

## 2020-12-04 NOTE — Progress Notes (Signed)
Clinical Summary Becky [name] is a 70 y.o.femaleseen today for follow up of the following medical problems.   1. ICM/Chronic systolic heart failure  - history of multiple PCIs as described below, mainly in [place].   - 04/2013 LVEF 30-35%. Repeat echo after BiV AICD 05/2014 shows LVEF 84%, grade I diastolic dysfunction.   - 09/2015 echo LVEF 55-60%, no WMAs, grade I diastolic dysfunction - 66/5993 echo [place]: LVEF 65-70%, moderate LVH.  - medical therapy has been limited by orthostatic symtpoms. Not on ACE or ARB - she has been on long term plavix due to her extensive stent burden    04/2019 echo LVEF 55-60%       - 09/2020 ICD check normal function   -no recent edema. No SOB/DOE. No chest pains.  - complaitnw with meds   2. OSA    - compliant with cpap, followed by Dr Marjory Lies in [place], [place].      3. Hyperlipidemia - muscle aches on multiple statins, has tolerated pravastatin - labs followed by pcp      4. HTN - has not taken meds yet today     5. COPD - followed by pulmonary in [place]  - followed by Dr Luciana Axe     Past Medical History:  Diagnosis Date   AICD (automatic cardioverter/defibrillator) present    Anxiety    Arthritis    "all my joints; neck, back, legs, arms, elbows"  (10/03/2015)   Asthma    CAD (coronary artery disease)    a. 5/08: s/p DES to PDA and DES to LAD; b. 1/09: s/p DES to pLAD; c. 1/11: s/p DES to LAD x 2, d. LHC (3/12 in [place] - EF 55%, mild ant HK, nl LM, LAD stents ok, oLAD 30, RCA stents ok;  e.  4/13:  s/p DES to RCA, f. 1/14: dLM 20-30, mOM3 20-30, mLAD 80 ISR => s/p 2.5x16 mm Promus Element DES; EF 45-50%    CHF (congestive heart failure) (HCC)    Chronic bronchitis (HCC)    Chronic lower back pain    "into pelvis region" (10/03/2015)   COPD (chronic obstructive pulmonary disease) (HCC)    Daily headache    "might be from sinus" (10/03/2015)   Depression    hx (10/03/2015)   Fibromyalgia    GERD  (gastroesophageal reflux disease)    Heart murmur    HLD (hyperlipidemia)    Hypertension    Ischemic cardiomyopathy    a. Echo (11/14):  EF 25%    LBBB (left bundle Salim Forero block)    Myocardial infarction ([place]) 12/2012   OSA on CPAP    Pneumonia 1950s   Syncope 11/14   LifeVest placed   Upper back pain, chronic    "into my shoulders; fibromyalgia works here" (10/03/2015)     Allergies  Allergen Reactions   Tape Rash and Other (See Comments)    Severe Reaction (latex tape): Burned hand, Redness-took days to clear up (03/02/12)   Lipitor [Atorvastatin] Other (See Comments)    Leg pain   Prozac [Fluoxetine Hcl] Other (See Comments)    Mean/violent thoughts   Codeine Nausea And Vomiting   Fosamax [Alendronate Sodium] Nausea Only and Other (See Comments)    Stomach pain   Keflex [Cephalexin] Nausea Only     Current Outpatient Medications  Medication Sig Dispense Refill   acetaminophen-codeine (TYLENOL #3) 300-30 MG tablet Take 1 tablet by mouth every 6 (six) hours as needed for moderate pain.  2   aspirin EC 81 MG tablet Take 1 tablet (81 mg total) by mouth daily.     Aspirin-Salicylamide-Caffeine (BC HEADACHE PO) Take 1 packet by mouth daily as needed (pain.).      b complex vitamins capsule Take 1 capsule by mouth daily.     benzonatate (TESSALON) 100 MG capsule Take 100 mg by mouth as needed.     cetirizine (ZYRTEC) 10 MG tablet Take 10 mg by mouth at bedtime.   4   cholecalciferol (VITAMIN D3) 25 MCG (1000 UNIT) tablet Take 1,000 Units by mouth daily.     clopidogrel (PLAVIX) 75 MG tablet Take 75 mg by mouth daily.      escitalopram (LEXAPRO) 20 MG tablet Take 20 mg by mouth daily.      FARXIGA 5 MG TABS tablet Take 5 mg by mouth daily.     fluticasone (FLONASE) 50 MCG/ACT nasal spray Place 2 sprays into both nostrils at bedtime.   7   formoterol (PERFOROMIST) 20 MCG/2ML nebulizer solution Take 20 mcg by nebulization 2 (two) times daily.     furosemide (LASIX) 40 MG tablet  TAKE 1 TABLET BY MOUTH DAILY. MAY TAKE ADDITIONAL 1/2 TABLET AS NEEDED FOR SWELLING 90 tablet 3   gabapentin (NEURONTIN) 300 MG capsule Take 300 mg by mouth 3 (three) times daily.      glipiZIDE (GLUCOTROL XL) 5 MG 24 hr tablet Take 5 mg by mouth daily.     ipratropium-albuterol (DUONEB) 0.5-2.5 (3) MG/3ML SOLN Take 3 mLs by nebulization every 6 (six) hours as needed (shortness of breath).     lactulose (CHRONULAC) 10 GM/15ML solution Take 10 g by mouth 2 (two) times daily as needed for mild constipation.     magnesium oxide (MAG-OX) 400 MG tablet Take 400 mg by mouth daily.     metFORMIN (GLUCOPHAGE) 500 MG tablet Take 1,000 mg by mouth 2 (two) times daily with a meal.      metoprolol succinate (TOPROL-XL) 50 MG 24 hr tablet TAKE 1 TABLET BY MOUTH DAILY WITH OR IMMEDIATELY FOLLOWING A MEAL 90 tablet 3   montelukast (SINGULAIR) 10 MG tablet Take 10 mg by mouth at bedtime.      nitroGLYCERIN (NITROSTAT) 0.4 MG SL tablet Place 1 tablet (0.4 mg total) under the tongue every 5 (five) minutes x 3 doses as needed. If no relief after 3rd dose, proceed to ED 25 tablet 5   pantoprazole (PROTONIX) 40 MG tablet TAKE 1 TABLET(40 MG) BY MOUTH DAILY 90 tablet 2   pravastatin (PRAVACHOL) 80 MG tablet TAKE 1 TABLET(80 MG) BY MOUTH EVERY EVENING 90 tablet 2   rOPINIRole (REQUIP) 0.5 MG tablet Take 1 mg by mouth at bedtime.      spironolactone (ALDACTONE) 25 MG tablet TAKE 1/2 TABLET(12.5 MG) BY MOUTH DAILY 45 tablet 2   VENTOLIN HFA 108 (90 Base) MCG/ACT inhaler Inhale 1-2 puffs into the lungs every 6 (six) hours as needed for wheezing or shortness of breath.      No current facility-administered medications for this visit.     Past Surgical History:  Procedure Laterality Date   BI-VENTRICULAR IMPLANTABLE CARDIOVERTER DEFIBRILLATOR N/A 06/02/2013   Procedure: BI-VENTRICULAR IMPLANTABLE CARDIOVERTER DEFIBRILLATOR  (CRT-D);  Surgeon: Coralyn Mark, MD;  Location: [facility] CATH LAB;  Service: Cardiovascular;   Laterality: N/A;   BI-VENTRICULAR IMPLANTABLE CARDIOVERTER DEFIBRILLATOR  (CRT-D)  06/02/2013   MDT VivaQuad CRTD implanted by Dr Rayann Heman for ICM, CHF   BIV ICD GENERATOR CHANGEOUT N/A 04/29/2019  Procedure: BIV ICD GENERATOR CHANGEOUT;  Surgeon: Thompson Grayer, MD;  Location: [facility] CV LAB;  Service: Cardiovascular;  Laterality: N/A;   CARDIAC CATHETERIZATION  12/2012   S/P MI   CORONARY ANGIOPLASTY WITH STENT PLACEMENT  2007; 2009; 2011   "?2 +1 +1 "   CORONARY ANGIOPLASTY WITH STENT PLACEMENT  02/2012   FRACTURE SURGERY     HYSTEROSCOPY W/ ENDOMETRIAL ABLATION  ~ 1998   LEAD REVISION  11-08-13   RA and LV lead revision by Dr Rayann Heman   LEAD REVISION Left 11/08/2013   Procedure: LEAD REVISION;  Surgeon: Coralyn Mark, MD;  Location: [facility] CATH LAB;  Service: Cardiovascular;  Laterality: Left;   LEFT HEART CATHETERIZATION WITH CORONARY ANGIOGRAM N/A 01/03/2013   Procedure: LEFT HEART CATHETERIZATION WITH CORONARY ANGIOGRAM;  Surgeon: Burnell Blanks, MD;  Location: [facility] CATH LAB;  Service: Cardiovascular;  Laterality: N/A;   ORIF ANKLE FRACTURE Right 10/21/2009   TUBAL LIGATION  1989     Allergies  Allergen Reactions   Tape Rash and Other (See Comments)    Severe Reaction (latex tape): Burned hand, Redness-took days to clear up (03/02/12)   Lipitor [Atorvastatin] Other (See Comments)    Leg pain   Prozac [Fluoxetine Hcl] Other (See Comments)    Mean/violent thoughts   Codeine Nausea And Vomiting   Fosamax [Alendronate Sodium] Nausea Only and Other (See Comments)    Stomach pain   Keflex [Cephalexin] Nausea Only      Family History  Problem Relation Age of Onset   Breast cancer Mother    Heart failure Brother    Breast cancer Sister    Breast cancer Sister    Uterine cancer Sister      Social History Ms. George reports that she has been smoking cigarettes. She started smoking about 29 years ago. She has a 10.00 pack-year smoking history. She has never used smokeless  tobacco. Ms. Yip reports current alcohol use.   Review of Systems CONSTITUTIONAL: No weight loss, fever, chills, weakness or fatigue.  HEENT: Eyes: No visual loss, blurred vision, double vision or yellow sclerae.No hearing loss, sneezing, congestion, runny nose or sore throat.  SKIN: No rash or itching.  CARDIOVASCULAR: per hpi RESPIRATORY: No shortness of breath, cough or sputum.  GASTROINTESTINAL: No anorexia, nausea, vomiting or diarrhea. No abdominal pain or blood.  GENITOURINARY: No burning on urination, no polyuria NEUROLOGICAL: No headache, dizziness, syncope, paralysis, ataxia, numbness or tingling in the extremities. No change in bowel or bladder control.  MUSCULOSKELETAL: No muscle, back pain, joint pain or stiffness.  LYMPHATICS: No enlarged nodes. No history of splenectomy.  PSYCHIATRIC: No history of depression or anxiety.  ENDOCRINOLOGIC: No reports of sweating, cold or heat intolerance. No polyuria or polydipsia.  Marland Kitchen   Physical Examination Today's Vitals   12/04/20 0913  BP: 138/70  Pulse: 78  SpO2: 98%  Weight: 167 lb (75.8 kg)  Height: 5' (1.524 m)   Body mass index is 32.61 kg/m.  Gen: resting comfortably, no acute distress HEENT: no scleral icterus, pupils equal round and reactive, no palptable cervical adenopathy,  CV: RRR, no m/r/g no jvd Resp: Clear to auscultation bilaterally GI: abdomen is soft, non-tender, non-distended, normal bowel sounds, no hepatosplenomegaly MSK: extremities are warm, no edema.  Skin: warm, no rash Neuro:  no focal deficits Psych: appropriate affect   Diagnostic Studies  04/2010 Cath [place], [place]: LVEF 55%, mild anterior hypokinesis. LM normal, LAD with stents in prox and mid portion widely patent, LAD  ostial 30%. Diags with luminal irregs. LCX with luminial irregs. RCA with distal patent stents   Stent cards   May 2008 DES to PDA, DES to LAD   Jan 2009 DES to prox LAD   Mar 05 2009 DES to LAD x2   05/2011 DES to  RCA   Jan 2014 stent done [place], [place]   03/2010 Echo: LVEF 40%, hypokinesis of the anteroseptum.    01/02/13 Cath   Hemodynamic Findings:   Central aortic pressure: 147/79   Left ventricular pressure: 130/22/26   Angiographic Findings:   Left main: 30% distal stenosis. It appears that the distal left main has a stent that continues into the LAD.   Left Anterior Descending Artery: Large vessel that courses to the apex. The entire proximal and mid LAD is stented. There is mild stent restenosis in the proximal segment. The mid stented segment has diffuse 30% stent restenosis. The distal vessel becomes small in caliber and has mild diffuse plaque. There is a moderate caliber first diagonal Elbia Paro with mild plaque disease.   Circumflex Artery: Moderate caliber non-dominant vessel with three moderate caliber obtuse marginal branches. No obstructive disease.   Right Coronary Artery: Large dominant vessel with 30% proximal stenosis, heavily calcified mid vessel with patent stent in the mid vessel (no significant restenosis). The distal vessel has diffuse 40-50% stenosis. The posterolateral Nini Cavan and PDA are moderate caliber, patent vessels with mild plaque disease.   Left Ventricular Angiogram: LVEF=25-30%. Hypokinesis of the antero-apical wall.   Impression:   1. 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 Elevated troponin following syncopal event. No culprit lesions seen.   3. Moderate to severe LV systolic dysfunction.   Recommendations: Continue medical management of CAD. Workup for syncope is underway. Carotid dopplers today. Will check d-dimer as PE is a possibility. She could also have had an arrythmia with LV dysfunction.   Complications: None. The patient tolerated the procedure well.   01/03/13 Echo   - LVEF 25-30%, akinesis of the anteroseptal and apical myocardium, grade I diastolic dysfunction,   31/43/88 Carotid US   - The vertebral arteries appear patent with  antegrade flow. - Findings consistent with1- 39 percent stenosis,high end of scale,involving the right internal carotid artery. - Findings consistent with 40 - 59 percent stenosis, low end of scale,involving the left internal carotid artery. - ICA/CCA ratio. right =1.72. left = 1.52 Other specific details can be found in the table(s) above. Prepared and Electronically Authenticated by  12/13/12 Event monitor   No symptoms reported, sinus rhythm with conduction delay, occasional PVCs.   04/2013 Echo   Study Conclusions  - Left ventricle: The cavity size was normal. Wall thickness was increased in a pattern of mild LVH with moderate basal septal hypertrophy. Systolic function was moderately to severely reduced. The estimated ejection fraction was in the range of 30% to 35%. There is akinesis to dyskinesis of the mid-distal anteroseptal and apical myocardium. There is akinesis of the distalinferoseptal myocardium. Doppler parameters are consistent with abnormal left ventricular relaxation (grade 1 diastolic dysfunction). - Ventricular septum: Septal motion showed abnormal function and dyssynergy. - Aortic valve: Mildly calcified annulus. Probably trileaflet; mildly calcified leaflets. No significant regurgitation. - Mitral valve: Calcified annulus. Trivial regurgitation. - Left atrium: The atrium was mildly dilated. - Right atrium: Central venous pressure: 77m Hg (est). - Tricuspid valve: Trivial regurgitation. - Pulmonary arteries: PA peak pressure: 214mHg (S). - Pericardium, extracardiac: There was no pericardial effusion.  Impressions:  - [place] with moderate basal septal hypertrophy, LVEF 30-35% with wall motion abnormalities as outlined, grade 1 diastolic dysfunction. Mild left atrial enlagement. MAC with trivial mitral regurgitation. MIldlysclerotic aortic valve. Trivial tricuspid regurgitation with normal PASP 20 mmHg.  05/2014 echo Study Conclusions  - Left ventricle:  Technically limited study. The cavity size was   normal. Wall thickness was increased in a pattern of moderate   LVH. The estimated ejection fraction was 55%. Doppler parameters   are consistent with abnormal left ventricular relaxation (grade 1   diastolic dysfunction). - Aortic valve: Sclerosis without stenosis. - Right ventricle: The cavity size was normal. Pacer wire or   catheter noted in right ventricle. Systolic function was normal. - Impressions: Since the study in 2015, there is definite   improvement in LV function.  Impressions:  - Since the study in 2015, there is definite improvement in LV   function.     09/2015 Nuclear Stress There was no ST segment deviation noted during stress. No T wave inversion was noted during stress. Defect 1: There is a medium defect of mild severity present in the basal inferior and mid inferior location. This defect is likely a result of bowel loop attenuation artifact. No significant ischemia identified. This is a low risk study. No significant ischemia identified. Nuclear stress EF: 54%. Mid to apical septal wall hypokinesis noted     09/2015 echo Study Conclusions   - Left ventricle: The cavity size was normal. Systolic function was   normal. The estimated ejection fraction was in the range of 55%   to 60%. Wall motion was normal; there were no regional wall   motion abnormalities. Doppler parameters are consistent with   abnormal left ventricular relaxation (grade 1 diastolic   dysfunction). Doppler parameters are consistent with   indeterminate ventricular filling pressure. - Aortic valve: Transvalvular velocity was within the normal range.   There was no stenosis. There was no regurgitation. - Mitral valve: Transvalvular velocity was within the normal range.   There was no evidence for stenosis. There was no regurgitation. - Left atrium: The atrium was moderately dilated. - Right ventricle: The cavity size was normal. Wall  thickness was   normal. Systolic function was normal. - Tricuspid valve: There was no regurgitation.   Assessment and Plan   1. ICM/Chronic systolic heart failure   - previous LVEF 30-35%. After BiV AICD repeat echo with normalized LVEF - medical therapy has been limited due to orthstatic symptoms. ACE-I previously stopped during 09/2015 admission due to hypotension.  - indefinite plavix due to large stent burden   -no recent symptoms, continue current meds     2.HTN - mildly elevated but has not taken meds yet today - continue to monitor     3. Hyperlipidemia - difficulty tolerating statins in the past, has tolerated pravastatin -continue pravatstaitn, request labs from pcp     F/u 6 months         Arnoldo Lenis, M.D

## 2020-12-10 ENCOUNTER — Ambulatory Visit (INDEPENDENT_AMBULATORY_CARE_PROVIDER_SITE_OTHER): Payer: Medicare Other

## 2020-12-10 DIAGNOSIS — Z95810 Presence of automatic (implantable) cardiac defibrillator: Secondary | ICD-10-CM

## 2020-12-10 DIAGNOSIS — I5022 Chronic systolic (congestive) heart failure: Secondary | ICD-10-CM

## 2020-12-10 NOTE — Progress Notes (Signed)
EPIC Encounter for ICM Monitoring  Patient Name: Becky [name] is a 70 y.o. female Date: 12/10/2020 Primary Care Physican: Lorelei Pont, DO Primary Cardiologist: Wyline Mood Electrophysiologist: Allred BiV Pacing:  86.2%      10/242022 Weight: 162 lbs   Time in AT/AF  0.0 hr/day (0.0%)     Spoke with patient and heart failure questions reviewed.  Pt asymptomatic for fluid accumulation and feeling well.   Discussed diet and possible fluid accumulation may be related to diet.    Optivol Thoracic impedance suggesting possible ongoing fluid accumulation starting 10/9.   Prescribed: Furosemide 40 mg Take 1 tablet (40 mg) by mouth scheduled in the morning, may take an additional 0.5 tablet (20 mg) if needed for swelling.   Labs: 06/11/2020 Creatinine 1.80, BUN 14, Potassium 3.9, Sodium 140, GFR 30 A complete set of results can be found in Results Review.   Recommendations: She will self adjust Furosemide over the next couple of days.    Follow-up plan: ICM clinic phone appointment on 12/17/2020 to recheck fluid levels.  91 day device clinic remote transmission scheduled 12/26/2020.     EP/Cardiology Office Visits:  09/20/2021 with Dr Johney Frame.      Copy of ICM check sent to Dr. Johney Frame and Dr Wyline Mood as Lorain Childes.     3 month ICM trend: 12/10/2020.    1 Year ICM trend:       Karie Soda, RN 12/10/2020 10:15 AM

## 2020-12-17 ENCOUNTER — Ambulatory Visit (INDEPENDENT_AMBULATORY_CARE_PROVIDER_SITE_OTHER): Payer: Medicare Other

## 2020-12-17 DIAGNOSIS — I5022 Chronic systolic (congestive) heart failure: Secondary | ICD-10-CM

## 2020-12-17 DIAGNOSIS — Z95810 Presence of automatic (implantable) cardiac defibrillator: Secondary | ICD-10-CM

## 2020-12-17 NOTE — Progress Notes (Signed)
EPIC Encounter for ICM Monitoring  Patient Name: Becky [name] is a 70 y.o. female Date: 12/17/2020 Primary Care Physican: Lorelei Pont, DO Primary Cardiologist: Wyline Mood Electrophysiologist: Allred BiV Pacing:  85.8%      12/10/2020 Weight: 162 lbs   Time in AT/AF  0.0 hr/day (0.0%)     Spoke with patient and heart failure questions reviewed.  Pt asymptomatic for fluid accumulation and feeling well.  She has been eating Ramen noodles, popcorn, tostitos corn chips, potato chips and cheese.  Pt will be seeing a kidney doctor in the next month.   She has several tests coming up regarding kidneys.  She got her new COVID vaccine.   Optivol Thoracic impedance suggesting possible ongoing fluid accumulation starting 10/9 but trending back toward baseline.   Prescribed:  Furosemide 40 mg Take 1 tablet (40 mg) by mouth scheduled in the morning, may take an additional 0.5 tablet (20 mg) if needed for swelling. (She self adjusts when needed)   Labs: 06/11/2020 Creatinine 1.80, BUN 14, Potassium 3.9, Sodium 140, GFR 30 A complete set of results can be found in Results Review.   Recommendations: Pt reports taking extra Furosemide for a couple doses since 12/10/2020.  She will take extra Furosemide 20 mg x 2 consecutive days.   Discussed the importance of limiting salt and avoiding snacks and foods that are high in salt.    Follow-up plan: ICM clinic phone appointment on 12/24/2020 (manual) to recheck fluid levels.  91 day device clinic remote transmission scheduled 12/26/2020.     EP/Cardiology Office Visits:  Recall 06/02/2021 with Dr Wyline Mood.  09/20/2021 with Dr Johney Frame.      Copy of ICM check sent to Dr. Johney Frame and Dr Wyline Mood as Lorain Childes.      3 month ICM trend: 12/17/2020.    1 Year ICM trend:       Karie Soda, RN 12/17/2020 10:16 AM

## 2020-12-19 ENCOUNTER — Other Ambulatory Visit: Payer: Self-pay | Admitting: Cardiology

## 2020-12-24 ENCOUNTER — Ambulatory Visit (INDEPENDENT_AMBULATORY_CARE_PROVIDER_SITE_OTHER): Payer: Medicare Other

## 2020-12-24 DIAGNOSIS — Z95810 Presence of automatic (implantable) cardiac defibrillator: Secondary | ICD-10-CM

## 2020-12-24 DIAGNOSIS — I5022 Chronic systolic (congestive) heart failure: Secondary | ICD-10-CM

## 2020-12-24 NOTE — Progress Notes (Signed)
EPIC Encounter for ICM Monitoring  Patient Name: Becky [name] is a 70 y.o. female Date: 12/24/2020 Primary Care Physican: Lorelei Pont, DO Primary Care Physican: Lorelei Pont, DO Primary Cardiologist: Wyline Mood Electrophysiologist: Allred BiV Pacing:  86.9%      12/10/2020 Weight: 162 lbs 12/24/2020 Weight: 162 lbs   Time in AT/AF  0.0 hr/day (0.0%)     Spoke with patient and heart failure questions reviewed.  Pt asymptomatic for fluid accumulation and is not having any fluid symptoms at this time.   Optivol Thoracic impedance suggesting fluid level is returning close to normal after taking extra Furosemide.   Prescribed:  Furosemide 40 mg Take 1 tablet (40 mg) by mouth scheduled in the morning, may take an additional 0.5 tablet (20 mg) if needed for swelling. (She self adjusts when needed)   Labs: 06/11/2020 Creatinine 1.80, BUN 14, Potassium 3.9, Sodium 140, GFR 30 A complete set of results can be found in Results Review.   Recommendations: She will take extra Furosemide the next couple of days.  Will recheck fluid levels.    Follow-up plan: ICM clinic phone appointment on 01/02/2021 to recheck fluid levels.  91 day device clinic remote transmission scheduled 12/26/2020.     EP/Cardiology Office Visits:  Recall 06/02/2021 with Dr Wyline Mood.  09/20/2021 with Dr Johney Frame.   Kidney physician (1st visit) 01/03/2021.   Copy of ICM check sent to Dr. Johney Frame.   3 month ICM trend: 12/24/2020.    1 Year ICM trend:       Karie Soda, RN 12/24/2020 3:00 PM

## 2020-12-26 ENCOUNTER — Ambulatory Visit (INDEPENDENT_AMBULATORY_CARE_PROVIDER_SITE_OTHER): Payer: Medicare Other

## 2020-12-26 DIAGNOSIS — I5022 Chronic systolic (congestive) heart failure: Secondary | ICD-10-CM

## 2020-12-26 LAB — CUP PACEART REMOTE DEVICE CHECK
Battery Remaining Longevity: 43 mo
Battery Voltage: 2.9 V
Brady Statistic AP VP Percent: 34.07 %
Brady Statistic AP VS Percent: 0.6 %
Brady Statistic AS VP Percent: 61.68 %
Brady Statistic AS VS Percent: 3.65 %
Brady Statistic RA Percent Paced: 31.91 %
Brady Statistic RV Percent Paced: 3.58 %
Date Time Interrogation Session: 20221109001807
HighPow Impedance: 73 Ohm
Implantable Lead Implant Date: 20150416
Implantable Lead Implant Date: 20150922
Implantable Lead Implant Date: 20150922
Implantable Lead Location: 753858
Implantable Lead Location: 753859
Implantable Lead Location: 753860
Implantable Lead Model: 4598
Implantable Lead Model: 5076
Implantable Lead Model: 6935
Implantable Pulse Generator Implant Date: 20210312
Lead Channel Impedance Value: 151.406
Lead Channel Impedance Value: 161.5 Ohm
Lead Channel Impedance Value: 166.25 Ohm
Lead Channel Impedance Value: 178.5 Ohm
Lead Channel Impedance Value: 178.5 Ohm
Lead Channel Impedance Value: 285 Ohm
Lead Channel Impedance Value: 323 Ohm
Lead Channel Impedance Value: 323 Ohm
Lead Channel Impedance Value: 323 Ohm
Lead Channel Impedance Value: 342 Ohm
Lead Channel Impedance Value: 399 Ohm
Lead Channel Impedance Value: 399 Ohm
Lead Channel Impedance Value: 399 Ohm
Lead Channel Impedance Value: 513 Ohm
Lead Channel Impedance Value: 551 Ohm
Lead Channel Impedance Value: 570 Ohm
Lead Channel Impedance Value: 608 Ohm
Lead Channel Impedance Value: 608 Ohm
Lead Channel Pacing Threshold Amplitude: 0.75 V
Lead Channel Pacing Threshold Amplitude: 1.125 V
Lead Channel Pacing Threshold Amplitude: 1.375 V
Lead Channel Pacing Threshold Pulse Width: 0.4 ms
Lead Channel Pacing Threshold Pulse Width: 0.4 ms
Lead Channel Pacing Threshold Pulse Width: 0.4 ms
Lead Channel Sensing Intrinsic Amplitude: 12.75 mV
Lead Channel Sensing Intrinsic Amplitude: 12.75 mV
Lead Channel Sensing Intrinsic Amplitude: 4.25 mV
Lead Channel Sensing Intrinsic Amplitude: 4.25 mV
Lead Channel Setting Pacing Amplitude: 1.5 V
Lead Channel Setting Pacing Amplitude: 2.5 V
Lead Channel Setting Pacing Amplitude: 4.75 V
Lead Channel Setting Pacing Pulse Width: 0.4 ms
Lead Channel Setting Pacing Pulse Width: 0.4 ms
Lead Channel Setting Sensing Sensitivity: 0.3 mV

## 2020-12-31 ENCOUNTER — Telehealth: Payer: Self-pay

## 2020-12-31 NOTE — Telephone Encounter (Signed)
Attempted return call to patient.  She sent remote transmission for fluid level review.  Left message to return call.

## 2020-12-31 NOTE — Telephone Encounter (Signed)
Spoke with patient.  Pt sent remote transmission for fluid level review.  She can tell she has fluid but no specific symptoms although weight has increased by 3.4 lbs in last week.  Transmission reviewed.  Explained fluid levels have significantly worsened in the last week.  She is unsure what is causing but does not typically follow low salt diet.  Current Weight: 165.4 lbs (was 162 lbs on 11/7)  Prescribed:  Furosemide 40 mg Take 1 tablet (40 mg) by mouth scheduled in the morning, may take an additional 0.5 tablet (20 mg) if needed for swelling. (She self adjusts when needed)   Labs: 06/11/2020 Creatinine 1.80, BUN 14, Potassium 3.9, Sodium 140, GFR 30  Pt has taken extra 20 mg of Furosemide several days each week for past 2-3 weeks.    Optivol Thoracic impedance suggesting possible significant worsening of fluid level accumulation in last week.  Pt has first OV with Kidney Specialist on 11/17.  Sent to Dr Wyline Mood for review due to extra Furosemide has not improved Optivol and patient still has weight gain.  90 day ICM trend:                                                                                                                                                   14 Month ICM Trend:

## 2021-01-02 NOTE — Telephone Encounter (Signed)
Attempted call toAttempted call to patient and unable to reach.  Left message to return call.

## 2021-01-02 NOTE — Telephone Encounter (Signed)
Spoke with patient.  Advised Dr Wyline Mood recommended she take Furosemide 80 mg daily x 3 days and then return to 40 mg daily.  Also send remote transmission on Friday and will provide Dr Wyline Mood with update.  She was agreeable to recommendations.

## 2021-01-02 NOTE — Telephone Encounter (Signed)
Attempted call to patient and unable to reach.  Left message to return call.  

## 2021-01-02 NOTE — Telephone Encounter (Signed)
If has been taking 60mg  of lasix without improvement would take 80mg  x 3 days then resume her prior 40mg  daily and udpate use on Friday  MD

## 2021-01-03 NOTE — Progress Notes (Signed)
Remote ICD transmission.   

## 2021-01-04 ENCOUNTER — Telehealth: Payer: Self-pay

## 2021-01-04 ENCOUNTER — Ambulatory Visit (INDEPENDENT_AMBULATORY_CARE_PROVIDER_SITE_OTHER): Payer: Medicare Other

## 2021-01-04 DIAGNOSIS — Z95810 Presence of automatic (implantable) cardiac defibrillator: Secondary | ICD-10-CM

## 2021-01-04 DIAGNOSIS — I5022 Chronic systolic (congestive) heart failure: Secondary | ICD-10-CM

## 2021-01-04 NOTE — Progress Notes (Signed)
EPIC Encounter for ICM Monitoring  Patient Name: Becky [name] is a 69 y.o. female Date: 01/04/2021 Primary Care Physican: Lorelei Pont, DO Primary Cardiologist: Wyline Mood Electrophysiologist: Allred BiV Pacing:  96.5%      12/10/2020 Weight: 162 lbs 12/24/2020 Weight: 162 lbs 12/31/2020 Weight: 165.4 lbs   Time in AT/AF  0.0 hr/day (0.0%)     Attempted call to patient x 2 and unable to reach.  Left message to return call. Transmission reviewed.    Optivol Thoracic impedance suggesting fluid levels are improving since taking 2 days of Furosemide 80 and 3rd dosage will be today, 11/18 as instructed by Dr Wyline Mood (see 11/14 phone note).   Prescribed:  Furosemide 40 mg Take 1 tablet (40 mg) by mouth scheduled in the morning, may take an additional 0.5 tablet (20 mg) if needed for swelling. (She self adjusts when needed)   Labs: 11/28/2020 Creatinine 1.58, BUN 12, Potassium 5.0, Sodium 141, GFR 33 06/11/2020 Creatinine 1.80, BUN 14, Potassium 3.9, Sodium 140, GFR 30 A complete set of results can be found in Results Review.   Recommendations:   Unable to reach.     Follow-up plan: ICM clinic phone appointment on 01/08/2021 to recheck fluid levels.  91 day device clinic remote transmission scheduled 03/27/2021.     EP/Cardiology Office Visits:  Recall 06/02/2021 with Dr Wyline Mood.  09/20/2021 with Dr Johney Frame.      Copy of ICM check sent to Dr. Johney Frame.   3 month ICM trend: 01/04/2021.    12-14 Month ICM trend:       Karie Soda, RN 01/04/2021 9:54 AM

## 2021-01-04 NOTE — Telephone Encounter (Signed)
Remote ICM transmission received.  Attempted call to patient regarding ICM remote transmission and left message to return call   

## 2021-01-04 NOTE — Telephone Encounter (Signed)
Attempted ICM call and left message to return call. 

## 2021-01-05 ENCOUNTER — Other Ambulatory Visit: Payer: Self-pay | Admitting: Cardiology

## 2021-01-07 ENCOUNTER — Ambulatory Visit (INDEPENDENT_AMBULATORY_CARE_PROVIDER_SITE_OTHER): Payer: Medicare Other

## 2021-01-07 DIAGNOSIS — Z95810 Presence of automatic (implantable) cardiac defibrillator: Secondary | ICD-10-CM

## 2021-01-07 DIAGNOSIS — I5022 Chronic systolic (congestive) heart failure: Secondary | ICD-10-CM

## 2021-01-07 NOTE — Progress Notes (Signed)
EPIC Encounter for ICM Monitoring  Patient Name: Becky [name] is a 70 y.o. female Date: 01/07/2021 Primary Care Physican: Lorelei Pont, DO Primary Cardiologist: Wyline Mood Electrophysiologist: Allred BiV Pacing:  97.7%      12/31/2020 Weight: 165.4 lbs 01/07/2021 Weight: 164 lbs   Time in AT/AF  0.0 hr/day (0.0%)     Spoke with patient and heart failure questions reviewed.  Pt asymptomatic for fluid accumulation.  She took extra Furosemide as instructed by Dr Wyline Mood resulting in good urine output.   Optivol Thoracic impedance suggesting fluid levels improved after taking 3 days of Furosemide 80 mg daily.   Prescribed:  Furosemide 40 mg Take 1 tablet (40 mg) by mouth scheduled in the morning, may take an additional 0.5 tablet (20 mg) if needed for swelling. (She self adjusts when needed)   Labs: 11/28/2020 Creatinine 1.58, BUN 12, Potassium 5.0, Sodium 141, GFR 33 06/11/2020 Creatinine 1.80, BUN 14, Potassium 3.9, Sodium 140, GFR 30 A complete set of results can be found in Results Review.   Recommendations:  Advised to limit salt intake.  Copy sent to Dr Wyline Mood for review after taking 3 days of Furosemide 80 mg daily   Follow-up plan: ICM clinic phone appointment on 01/14/2021 to recheck fluid levels.  91 day device clinic remote transmission scheduled 03/27/2021.     EP/Cardiology Office Visits:  Recall 06/02/2021 with Dr Wyline Mood.  09/20/2021 with Dr Johney Frame.      Copy of ICM check sent to Dr. Johney Frame.    3 month ICM trend: 01/07/2021.    12-14 Month ICM trend:       Karie Soda, RN 01/07/2021 12:52 PM

## 2021-01-14 ENCOUNTER — Ambulatory Visit (INDEPENDENT_AMBULATORY_CARE_PROVIDER_SITE_OTHER): Payer: Medicare Other

## 2021-01-14 DIAGNOSIS — Z95810 Presence of automatic (implantable) cardiac defibrillator: Secondary | ICD-10-CM

## 2021-01-14 DIAGNOSIS — I5022 Chronic systolic (congestive) heart failure: Secondary | ICD-10-CM

## 2021-01-14 NOTE — Progress Notes (Signed)
EPIC Encounter for ICM Monitoring  Patient Name: Becky [name] is a 70 y.o. female Date: 01/14/2021 Primary Care Physican: Lorelei Pont, DO Primary Cardiologist: Wyline Mood Electrophysiologist: Allred BiV Pacing:  97.5%      12/31/2020 Weight: 165.4 lbs 01/07/2021 Weight: 164 lbs 01/14/2021 Weight: 164 lbs   Time in AT/AF  0.0 hr/day (0.0%)     Spoke with patient and heart failure questions reviewed.  Pt asymptomatic for fluid accumulation.  She reports limiting salt since last report.    Optivol Thoracic impedance suggesting fluid levels have returned to normal after taking extra Furosemide 20 mg x 2 days.   Prescribed:  Furosemide 40 mg Take 1 tablet (40 mg) by mouth scheduled in the morning, may take an additional 0.5 tablet (20 mg) if needed for swelling. (She self adjusts when needed)   Labs: 11/28/2020 Creatinine 1.58, BUN 12, Potassium 5.0, Sodium 141, GFR 33 06/11/2020 Creatinine 1.80, BUN 14, Potassium 3.9, Sodium 140, GFR 30 A complete set of results can be found in Results Review.   Recommendations:  Recommendation to continue to limit salt intake.  Encouraged to call if experiencing any fluid symptoms.    Follow-up plan: ICM clinic phone appointment on 02/04/2021.  91 day device clinic remote transmission scheduled 03/27/2021.     EP/Cardiology Office Visits:  Recall 06/02/2021 with Dr Wyline Mood.  09/20/2021 with Dr Johney Frame.      Copy of ICM check sent to Dr. Johney Frame and Dr Wyline Mood as Lorain Childes.    3 month ICM trend: 01/14/2021.    12-14 Month ICM trend:       Karie Soda, RN 01/14/2021 1:03 PM

## 2021-01-17 ENCOUNTER — Other Ambulatory Visit: Payer: Self-pay | Admitting: Cardiology

## 2021-01-18 ENCOUNTER — Encounter: Payer: Medicare Other | Admitting: Internal Medicine

## 2021-02-04 ENCOUNTER — Ambulatory Visit: Payer: Medicare Other

## 2021-02-04 DIAGNOSIS — I5022 Chronic systolic (congestive) heart failure: Secondary | ICD-10-CM

## 2021-02-04 DIAGNOSIS — Z95810 Presence of automatic (implantable) cardiac defibrillator: Secondary | ICD-10-CM

## 2021-02-04 NOTE — Progress Notes (Signed)
EPIC Encounter for ICM Monitoring  Patient Name: Becky [name] is a 70 y.o. female Date: 02/04/2021 Primary Care Physican: Lorelei Pont, DO Primary Cardiologist: Wyline Mood Electrophysiologist: Allred BiV Pacing:  96.4%      02/04/2021 Weight: 164 lbs   Time in AT/AF  0.0 hr/day (0.0%)     Spoke with patient and heart failure questions reviewed.  Pt took extra Furosemide on 12/18 which shows impedance starting to trend back to baseline.    Optivol Thoracic impedance suggesting possible fluid accumulation starting 12/14 and trending back to baseline.   Prescribed:  Furosemide 40 mg Take 1 tablet (40 mg) by mouth scheduled in the morning, may take an additional 0.5 tablet (20 mg) if needed for swelling. (She self adjusts when needed)   Labs: 11/28/2020 Creatinine 1.58, BUN 12, Potassium 5.0, Sodium 141, GFR 33 06/11/2020 Creatinine 1.80, BUN 14, Potassium 3.9, Sodium 140, GFR 30 A complete set of results can be found in Results Review.   Recommendations:  She will take extra Furosemide x 1 additional day.      Follow-up plan: ICM clinic phone appointment on 03/11/2021.  91 day device clinic remote transmission scheduled 03/27/2021.     EP/Cardiology Office Visits:  Recall 06/02/2021 with Dr Wyline Mood.  09/20/2021 with Dr Johney Frame.      Copy of ICM check sent to Dr. Johney Frame.  3 month ICM trend: 02/04/2021.    12-14 Month ICM trend:       Karie Soda, RN 02/04/2021 4:07 PM

## 2021-03-11 ENCOUNTER — Ambulatory Visit (INDEPENDENT_AMBULATORY_CARE_PROVIDER_SITE_OTHER): Payer: Medicare Other

## 2021-03-11 DIAGNOSIS — I5022 Chronic systolic (congestive) heart failure: Secondary | ICD-10-CM | POA: Diagnosis not present

## 2021-03-11 DIAGNOSIS — Z95810 Presence of automatic (implantable) cardiac defibrillator: Secondary | ICD-10-CM | POA: Diagnosis not present

## 2021-03-12 ENCOUNTER — Telehealth: Payer: Self-pay

## 2021-03-12 NOTE — Telephone Encounter (Signed)
Remote ICM transmission received.  Attempted call to patient regarding ICM remote transmission and left detailed message per DPR.  Advised to return call for any fluid symptoms or questions. Next ICM remote transmission scheduled 04/15/2021.   °

## 2021-03-12 NOTE — Progress Notes (Signed)
EPIC Encounter for ICM Monitoring  Patient Name: Becky [name] is a 71 y.o. female Date: 03/12/2021 Primary Care Physican: Lorelei Pont, DO Primary Cardiologist: Wyline Mood Electrophysiologist: Allred BiV Pacing:  97.2%      02/04/2021 Weight: 164 lbs   Time in AT/AF  0.0 hr/day (0.0%)     Attempted call to patient and unable to reach.  Left detailed message per DPR regarding transmission. Transmission reviewed.    Optivol Thoracic impedance suggesting normal fluid levels.   Prescribed:  Furosemide 40 mg Take 1 tablet (40 mg) by mouth scheduled in the morning, may take an additional 0.5 tablet (20 mg) if needed for swelling. (She self adjusts when needed)   Labs: 11/28/2020 Creatinine 1.58, BUN 12, Potassium 5.0, Sodium 141, GFR 33 06/11/2020 Creatinine 1.80, BUN 14, Potassium 3.9, Sodium 140, GFR 30 A complete set of results can be found in Results Review.   Recommendations: Left voice mail with ICM number and encouraged to call if experiencing any fluid symptoms.     Follow-up plan: ICM clinic phone appointment on 04/15/2021.  91 day device clinic remote transmission scheduled 03/27/2021.     EP/Cardiology Office Visits:  Recall 06/02/2021 with Dr Wyline Mood.  09/20/2021 with Dr Johney Frame.      Copy of ICM check sent to Dr. Johney Frame.  3 month ICM trend: 03/11/2021.    12-14 Month ICM trend:     Karie Soda, RN 03/12/2021 4:37 PM

## 2021-03-13 NOTE — Progress Notes (Signed)
Spoke with patient and heart failure questions reviewed.  Pt asymptomatic for fluid accumulation.  Reports feeling well at this time and voices no complaints. No changes and encouraged to call if experiencing any fluid symptoms. 

## 2021-03-27 ENCOUNTER — Ambulatory Visit (INDEPENDENT_AMBULATORY_CARE_PROVIDER_SITE_OTHER): Payer: Medicare Other

## 2021-03-27 DIAGNOSIS — I255 Ischemic cardiomyopathy: Secondary | ICD-10-CM

## 2021-03-27 LAB — CUP PACEART REMOTE DEVICE CHECK
Battery Remaining Longevity: 44 mo
Battery Voltage: 2.95 V
Brady Statistic AP VP Percent: 8.55 %
Brady Statistic AP VS Percent: 0.14 %
Brady Statistic AS VP Percent: 88.23 %
Brady Statistic AS VS Percent: 3.07 %
Brady Statistic RA Percent Paced: 8.58 %
Brady Statistic RV Percent Paced: 4.42 %
Date Time Interrogation Session: 20230208042508
HighPow Impedance: 117 Ohm
Implantable Lead Implant Date: 20150416
Implantable Lead Implant Date: 20150922
Implantable Lead Implant Date: 20150922
Implantable Lead Location: 753858
Implantable Lead Location: 753859
Implantable Lead Location: 753860
Implantable Lead Model: 4598
Implantable Lead Model: 5076
Implantable Lead Model: 6935
Implantable Pulse Generator Implant Date: 20210312
Lead Channel Impedance Value: 171 Ohm
Lead Channel Impedance Value: 171 Ohm
Lead Channel Impedance Value: 191.854
Lead Channel Impedance Value: 191.854
Lead Channel Impedance Value: 191.854
Lead Channel Impedance Value: 285 Ohm
Lead Channel Impedance Value: 342 Ohm
Lead Channel Impedance Value: 342 Ohm
Lead Channel Impedance Value: 342 Ohm
Lead Channel Impedance Value: 380 Ohm
Lead Channel Impedance Value: 380 Ohm
Lead Channel Impedance Value: 437 Ohm
Lead Channel Impedance Value: 456 Ohm
Lead Channel Impedance Value: 608 Ohm
Lead Channel Impedance Value: 608 Ohm
Lead Channel Impedance Value: 665 Ohm
Lead Channel Impedance Value: 665 Ohm
Lead Channel Impedance Value: 703 Ohm
Lead Channel Pacing Threshold Amplitude: 0.625 V
Lead Channel Pacing Threshold Amplitude: 1 V
Lead Channel Pacing Threshold Amplitude: 2.75 V
Lead Channel Pacing Threshold Pulse Width: 0.4 ms
Lead Channel Pacing Threshold Pulse Width: 0.4 ms
Lead Channel Pacing Threshold Pulse Width: 0.4 ms
Lead Channel Sensing Intrinsic Amplitude: 4.125 mV
Lead Channel Sensing Intrinsic Amplitude: 4.125 mV
Lead Channel Sensing Intrinsic Amplitude: 6.25 mV
Lead Channel Sensing Intrinsic Amplitude: 6.25 mV
Lead Channel Setting Pacing Amplitude: 1.5 V
Lead Channel Setting Pacing Amplitude: 2.25 V
Lead Channel Setting Pacing Amplitude: 4.25 V
Lead Channel Setting Pacing Pulse Width: 0.4 ms
Lead Channel Setting Pacing Pulse Width: 0.4 ms
Lead Channel Setting Sensing Sensitivity: 0.3 mV

## 2021-04-01 NOTE — Progress Notes (Signed)
Remote ICD transmission.   

## 2021-04-17 ENCOUNTER — Ambulatory Visit (INDEPENDENT_AMBULATORY_CARE_PROVIDER_SITE_OTHER): Payer: Medicare Other

## 2021-04-17 DIAGNOSIS — Z95810 Presence of automatic (implantable) cardiac defibrillator: Secondary | ICD-10-CM

## 2021-04-17 DIAGNOSIS — I5022 Chronic systolic (congestive) heart failure: Secondary | ICD-10-CM | POA: Diagnosis not present

## 2021-04-19 NOTE — Progress Notes (Signed)
EPIC Encounter for ICM Monitoring ? ?Patient Name: Becky [name] is a 70 y.o. female ?Date: 04/19/2021 ?Primary Care Physican: Lorelei Pont, DO ?Primary Cardiologist: Branch ?Electrophysiologist: Allred ?BiV Pacing:  93.4%      ?04/19/2021 Weight: 164 lbs ?  ?Time in AT/AF  0.0 hr/day (0.0%) ?  ?  ?Spoke with patient and heart failure questions reviewed.  Pt asymptomatic for fluid accumulation.  Reports feeling well at this time and voices no complaints.  ?  ?Optivol Thoracic impedance suggesting normal fluid levels. ?  ?Prescribed:  ?Furosemide 40 mg Take 1 tablet (40 mg) by mouth scheduled in the morning, may take an additional 0.5 tablet (20 mg) if needed for swelling. (She self adjusts when needed) ?  ?Labs: ?11/28/2020 Creatinine 1.58, BUN 12, Potassium 5.0, Sodium 141, GFR 33 ?06/11/2020 Creatinine 1.80, BUN 14, Potassium 3.9, Sodium 140, GFR 30 ?A complete set of results can be found in Results Review. ?  ?Recommendations:  No changes and encouraged to call if experiencing any fluid symptoms. ?  ?Follow-up plan: ICM clinic phone appointment on 05/20/2021.  91 day device clinic remote transmission scheduled 06/26/2021.   ?  ?EP/Cardiology Office Visits:  Recall 06/02/2021 with Dr Wyline Mood.  09/20/2021 with Dr Johney Frame.    ?  ?Copy of ICM check sent to Dr. Johney Frame. ? ?3 month ICM trend: 04/17/2021. ? ? ? ?12-14 Month ICM trend:  ? ? ? ?Karie Soda, RN ?04/19/2021 ?4:29 PM ? ?

## 2021-05-20 ENCOUNTER — Ambulatory Visit (INDEPENDENT_AMBULATORY_CARE_PROVIDER_SITE_OTHER): Payer: Medicare Other

## 2021-05-20 DIAGNOSIS — Z95810 Presence of automatic (implantable) cardiac defibrillator: Secondary | ICD-10-CM | POA: Diagnosis not present

## 2021-05-20 DIAGNOSIS — I5022 Chronic systolic (congestive) heart failure: Secondary | ICD-10-CM | POA: Diagnosis not present

## 2021-05-21 ENCOUNTER — Other Ambulatory Visit: Payer: Self-pay | Admitting: Cardiology

## 2021-05-22 ENCOUNTER — Other Ambulatory Visit: Payer: Self-pay | Admitting: Cardiology

## 2021-05-22 NOTE — Progress Notes (Signed)
EPIC Encounter for ICM Monitoring ? ?Patient Name: Becky [name] is a 71 y.o. female ?Date: 05/22/2021 ?Primary Care Physican: Lorelei Pont, DO ?Primary Cardiologist: Branch ?Electrophysiologist: Allred ?BiV Pacing:  93.1%      ?04/19/2021 Weight: 164 lbs ?05/22/2021 Weight: 164 lbs  ?  ?Time in AT/AF  0.0 hr/day (0.0%) ?  ?  ?Spoke with patient and heart failure questions reviewed.  Pt asymptomatic for fluid accumulation.  She may not be drinking enough fluid daily.  ?  ?Optivol Thoracic impedance suggesting possible dryness starting 3/8. ?  ?Prescribed:  ?Furosemide 40 mg Take 1 tablet (40 mg) by mouth scheduled in the morning, may take an additional 0.5 tablet (20 mg) if needed for swelling. (She self adjusts when needed) ?  ?Labs: ?11/28/2020 Creatinine 1.58, BUN 12, Potassium 5.0, Sodium 141, GFR 33 ?06/11/2020 Creatinine 1.80, BUN 14, Potassium 3.9, Sodium 140, GFR 30 ?A complete set of results can be found in Results Review. ?  ?Recommendations:   Advised to increase fluid intake for next 2 days and then maintain daily intake of 64 oz.  No changes and encouraged to call if experiencing any fluid symptoms. ?  ?Follow-up plan: ICM clinic phone appointment on 06/24/2021.  91 day device clinic remote transmission scheduled 06/26/2021.   ?  ?EP/Cardiology Office Visits:  06/03/2021 with Dr Wyline Mood.  09/20/2021 with Dr Johney Frame.    ?  ?Copy of ICM check sent to Dr. Johney Frame. ? ?3 month ICM trend: 05/20/2021. ? ? ? ?12-14 Month ICM trend:  ? ? ? ?Karie Soda, RN ?05/22/2021 ?3:45 PM ? ?

## 2021-06-03 ENCOUNTER — Encounter: Payer: Self-pay | Admitting: Cardiology

## 2021-06-03 ENCOUNTER — Encounter: Payer: Self-pay | Admitting: *Deleted

## 2021-06-03 ENCOUNTER — Ambulatory Visit (INDEPENDENT_AMBULATORY_CARE_PROVIDER_SITE_OTHER): Payer: Medicare Other | Admitting: Cardiology

## 2021-06-03 VITALS — BP 122/64 | HR 55 | Ht 60.0 in | Wt 166.4 lb

## 2021-06-03 DIAGNOSIS — I5022 Chronic systolic (congestive) heart failure: Secondary | ICD-10-CM

## 2021-06-03 DIAGNOSIS — I255 Ischemic cardiomyopathy: Secondary | ICD-10-CM | POA: Diagnosis not present

## 2021-06-03 DIAGNOSIS — I10 Essential (primary) hypertension: Secondary | ICD-10-CM

## 2021-06-03 DIAGNOSIS — E782 Mixed hyperlipidemia: Secondary | ICD-10-CM

## 2021-06-03 MED ORDER — FUROSEMIDE 20 MG PO TABS: 20.0000 mg | ORAL_TABLET | Freq: Every day | ORAL | 3 refills | Status: DC

## 2021-06-03 MED ORDER — EZETIMIBE 10 MG PO TABS: 10.0000 mg | ORAL_TABLET | Freq: Every day | ORAL | 6 refills | Status: DC

## 2021-06-03 NOTE — Progress Notes (Signed)
? ? ? ?Clinical Summary ?Ms. Becky [name] is a 71 y.o.female female seen today for follow up of the following medical problems. ?  ?1. ICM/Chronic systolic heart failure  ?- history of multiple PCIs as described below, mainly in [place].   ?- 04/2013 LVEF 30-35%. Repeat echo after BiV AICD 05/2014 shows LVEF XX123456, grade I diastolic dysfunction.   ?- 09/2015 echo LVEF 55-60%, no WMAs, grade I diastolic dysfunction ?- [id number] echo [place]: LVEF 65-70%, moderate LVH.  ?- medical therapy has been limited by orthostatic symtpoms. Not on ACE or ARB ?- she has been on long term plavix due to her extensive stent burden ?  ? 04/2019 echo LVEF 55-60% ?  ? - no recent edema. No SOB/DOE. No chest pain ?- last device check suggested some hypovolemia. Some orthostatic symptoms ?- 03/2021 normal ICD check ?  ?2. OSA   ? - compliant with cpap ?- looking to establish with new sleep medicien doctor, prior has left. She report pcp is working on this.  ?  ?3. Hyperlipidemia ?- muscle aches on multiple statins, has tolerated pravastatin ?- labs followed by pcp ? ?Jan 2023 TC 194 TG 165 HDL 52 LDL 109 ?  ?  ? 4. HTN ?-compliant with meds ?  ?  ?  ?5. COPD ?- followed by pulmonary in [place]  ?- followed by Dr Luciana Axe ?  ? ? ?6. CKD ?- followed by nephrology ?  ?7. Shoulder injury ?- sedentary lifestyle, uses cane at home. Limited by leg pains/weakness, balance. Cannot eval functional capacity by history.  ?- would need lexiscan prior to any surgery ? ? ?Past Medical History:  ?Diagnosis Date  ? AICD (automatic cardioverter/defibrillator) present   ? Anxiety   ? Arthritis   ? "all my joints; neck, back, legs, arms, elbows"  (10/03/2015)  ? Asthma   ? CAD (coronary artery disease)   ? a. 5/08: s/p DES to PDA and DES to LAD; b. 1/09: s/p DES to pLAD; c. 1/11: s/p DES to LAD x 2, d. LHC (3/12 in [place] - EF 55%, mild ant HK, nl LM, LAD stents ok, oLAD 30, RCA stents ok;  e.  4/13:  s/p DES to RCA, f. 1/14: dLM 20-30, mOM3 20-30,  mLAD 80 ISR => s/p 2.5x16 mm Promus Element DES; EF 45-50%   ? CHF (congestive heart failure) ([place])   ? Chronic bronchitis ([place])   ? Chronic lower back pain   ? "into pelvis region" (10/03/2015)  ? COPD (chronic obstructive pulmonary disease) ([place])   ? Daily headache   ? "might be from sinus" (10/03/2015)  ? Depression   ? hx (10/03/2015)  ? Fibromyalgia   ? GERD (gastroesophageal reflux disease)   ? Heart murmur   ? HLD (hyperlipidemia)   ? Hypertension   ? Ischemic cardiomyopathy   ? a. Echo (11/14):  EF 25%   ? LBBB (left bundle [place] block)   ? Myocardial infarction [facility]) 12/2012  ? OSA on CPAP   ? Pneumonia 1950s  ? Syncope 11/14  ? LifeVest placed  ? Upper back pain, chronic   ? "into my shoulders; fibromyalgia works here" (10/03/2015)  ? ? ? ?Allergies  ?Allergen Reactions  ? Tape Rash and Other (See Comments)  ?  Severe Reaction (latex tape): Burned hand, Redness-took days to clear up (03/02/12)  ? Lipitor [Atorvastatin] Other (See Comments)  ?  Leg pain  ? Prozac [Fluoxetine Hcl] Other (See Comments)  ?  Mean/violent thoughts  ?  Codeine Nausea And Vomiting  ? Fosamax [Alendronate Sodium] Nausea Only and Other (See Comments)  ?  Stomach pain  ? Keflex [Cephalexin] Nausea Only  ? ? ? ?Current Outpatient Medications  ?Medication Sig Dispense Refill  ? acetaminophen-codeine (TYLENOL #3) 300-30 MG tablet Take 1 tablet by mouth every 6 (six) hours as needed for moderate pain.   2  ? aspirin EC 81 MG tablet Take 1 tablet (81 mg total) by mouth daily.    ? Aspirin-Salicylamide-Caffeine (BC HEADACHE PO) Take 1 packet by mouth daily as needed (pain.).     ? b complex vitamins capsule Take 1 capsule by mouth daily.    ? benzonatate (TESSALON) 100 MG capsule Take 100 mg by mouth as needed. (Patient not taking: Reported on 12/04/2020)    ? cetirizine (ZYRTEC) 10 MG tablet Take 10 mg by mouth at bedtime.   4  ? cholecalciferol (VITAMIN D3) 25 MCG (1000 UNIT) tablet Take 1,000 Units by mouth daily.    ? clopidogrel (PLAVIX)  75 MG tablet Take 75 mg by mouth daily.     ? escitalopram (LEXAPRO) 20 MG tablet Take 20 mg by mouth daily.     ? FARXIGA 5 MG TABS tablet Take 5 mg by mouth daily.    ? fluticasone (FLONASE) 50 MCG/ACT nasal spray Place 2 sprays into both nostrils at bedtime.   7  ? formoterol (PERFOROMIST) 20 MCG/2ML nebulizer solution Take 20 mcg by nebulization 2 (two) times daily.    ? furosemide (LASIX) 40 MG tablet TAKE 1 TABLET BY MOUTH DAILY. MAY TAKE ADDITIONAL 1/2 TABLET AS NEEDED FOR SWELLING 90 tablet 3  ? gabapentin (NEURONTIN) 300 MG capsule Take 300 mg by mouth 3 (three) times daily.     ? glipiZIDE (GLUCOTROL XL) 5 MG 24 hr tablet Take 5 mg by mouth daily.    ? ipratropium-albuterol (DUONEB) 0.5-2.5 (3) MG/3ML SOLN Take 3 mLs by nebulization every 6 (six) hours as needed (shortness of breath).    ? lactulose (CHRONULAC) 10 GM/15ML solution Take 10 g by mouth 2 (two) times daily as needed for mild constipation.    ? magnesium oxide (MAG-OX) 400 MG tablet Take 400 mg by mouth daily.    ? metFORMIN (GLUCOPHAGE) 500 MG tablet Take 1,000 mg by mouth 2 (two) times daily with a meal.     ? metoprolol succinate (TOPROL-XL) 50 MG 24 hr tablet TAKE 1 TABLET BY MOUTH DAILY WITH OR IMMEDIATELY FOLLOWING A MEAL 90 tablet 3  ? montelukast (SINGULAIR) 10 MG tablet Take 10 mg by mouth at bedtime.     ? nitroGLYCERIN (NITROSTAT) 0.4 MG SL tablet Place 1 tablet (0.4 mg total) under the tongue every 5 (five) minutes x 3 doses as needed. If no relief after 3rd dose, proceed to ED 25 tablet 5  ? pantoprazole (PROTONIX) 40 MG tablet TAKE 1 TABLET(40 MG) BY MOUTH DAILY 90 tablet 0  ? pravastatin (PRAVACHOL) 80 MG tablet TAKE 1 TABLET(80 MG) BY MOUTH EVERY EVENING 90 tablet 2  ? rOPINIRole (REQUIP) 0.5 MG tablet Take 1 mg by mouth at bedtime.     ? spironolactone (ALDACTONE) 25 MG tablet TAKE 1/2 TABLET(12.5 MG) BY MOUTH DAILY 45 tablet 2  ? VENTOLIN HFA 108 (90 Base) MCG/ACT inhaler Inhale 1-2 puffs into the lungs every 6 (six) hours as  needed for wheezing or shortness of breath.     ? ?No current facility-administered medications for this visit.  ? ? ? ?Past Surgical History:  ?  Procedure Laterality Date  ? BI-VENTRICULAR IMPLANTABLE CARDIOVERTER DEFIBRILLATOR N/A 06/02/2013  ? Procedure: BI-VENTRICULAR IMPLANTABLE CARDIOVERTER DEFIBRILLATOR  (CRT-D);  Surgeon: Coralyn Mark, MD;  Location: [facility] CATH LAB;  Service: Cardiovascular;  Laterality: N/A;  ? BI-VENTRICULAR IMPLANTABLE CARDIOVERTER DEFIBRILLATOR  (CRT-D)  06/02/2013  ? MDT VivaQuad CRTD implanted by Dr Rayann Heman for ICM, CHF  ? BIV ICD GENERATOR CHANGEOUT N/A 04/29/2019  ? Procedure: BIV ICD GENERATOR CHANGEOUT;  Surgeon: Thompson Grayer, MD;  Location: [facility] CV LAB;  Service: Cardiovascular;  Laterality: N/A;  ? CARDIAC CATHETERIZATION  12/2012  ? S/P MI  ? CORONARY ANGIOPLASTY WITH STENT PLACEMENT  2007; 2009; 2011  ? "?2 +1 +1 "  ? CORONARY ANGIOPLASTY WITH STENT PLACEMENT  02/2012  ? FRACTURE SURGERY    ? HYSTEROSCOPY W/ ENDOMETRIAL ABLATION  ~ 1998  ? LEAD REVISION  11-08-13  ? RA and LV lead revision by Dr Rayann Heman  ? LEAD REVISION Left 11/08/2013  ? Procedure: LEAD REVISION;  Surgeon: Coralyn Mark, MD;  Location: [facility] CATH LAB;  Service: Cardiovascular;  Laterality: Left;  ? LEFT HEART CATHETERIZATION WITH CORONARY ANGIOGRAM N/A 01/03/2013  ? Procedure: LEFT HEART CATHETERIZATION WITH CORONARY ANGIOGRAM;  Surgeon: Burnell Blanks, MD;  Location[facility]ATH LAB;  Service: Cardiovascular;  Laterality: N/A;  ? ORIF ANKLE FRACTURE Right 10/21/2009  ? TUBAL LIGATION  1989  ? ? ? ?Allergies  ?Allergen Reactions  ? Tape Rash and Other (See Comments)  ?  Severe Reaction (latex tape): Burned hand, Redness-took days to clear up (03/02/12)  ? Lipitor [Atorvastatin] Other (See Comments)  ?  Leg pain  ? Prozac [Fluoxetine Hcl] Other (See Comments)  ?  Mean/violent thoughts  ? Codeine Nausea And Vomiting  ? Fosamax [Alendronate Sodium] Nausea Only and Other (See Comments)  ?  Stomach pain  ? Keflex  [Cephalexin] Nausea Only  ? ? ? ? ?Family History  ?Problem Relation Age of Onset  ? Breast cancer Mother   ? Heart failure Brother   ? Breast cancer Sister   ? Breast cancer Sister   ? Uterine cancer Siste

## 2021-06-03 NOTE — Patient Instructions (Signed)
Medication Instructions:  ?Begin Zetia 10mg  daily ?Decrease Lasix to 20mg  daily, may take an additional 20mg  as needed for swelling  ?Continue all other medications.    ? ?Labwork: ?none ? ?Testing/Procedures: ?none ? ?Follow-Up: ?6 months  ? ?Any Other Special Instructions Will Be Listed Below (If Applicable). ? ? ?If you need a refill on your cardiac medications before your next appointment, please call your pharmacy. ? ?

## 2021-06-12 ENCOUNTER — Other Ambulatory Visit: Payer: Self-pay | Admitting: Cardiology

## 2021-06-24 ENCOUNTER — Ambulatory Visit (INDEPENDENT_AMBULATORY_CARE_PROVIDER_SITE_OTHER): Payer: Medicare Other

## 2021-06-24 DIAGNOSIS — Z95810 Presence of automatic (implantable) cardiac defibrillator: Secondary | ICD-10-CM

## 2021-06-24 DIAGNOSIS — I5022 Chronic systolic (congestive) heart failure: Secondary | ICD-10-CM | POA: Diagnosis not present

## 2021-06-24 NOTE — Progress Notes (Signed)
EPIC Encounter for ICM Monitoring ? ?Patient Name: Becky [name] is a 71 y.o. female ?Date: 06/24/2021 ?Primary Care Physican: Lorelei Pont, DO ?Primary Cardiologist: Branch ?Electrophysiologist: Allred ?BiV Pacing: 88.7%      ?04/19/2021 Weight: 164 lbs ?05/22/2021 Weight: 164 lbs  ?  ?Time in AT/AF  0.0 hr/day (0.0%) ?  ?  ?Attempted call to patient and unable to reach.  Left detailed message per DPR regarding transmission. Transmission reviewed.  ?  ?Optivol Thoracic impedance suggesting possible fluid accumulation starting 4/17 with exception of 5/2 at baseline. ?  ?Prescribed:  ?Furosemide 20 mg Take 1 tablet (20 mg total) by mouth daily. (May take an additional 20mg  as needed for swelling.) ?  ?Labs: ?11/28/2020 Creatinine 1.58, BUN 12, Potassium 5.0, Sodium 141, GFR 33 ?06/11/2020 Creatinine 1.80, BUN 14, Potassium 3.9, Sodium 140, GFR 30 ?A complete set of results can be found in Results Review. ?  ?Recommendations:   Left voice mail with ICM number and encouraged to call if experiencing any fluid symptoms. ?  ?Follow-up plan: ICM clinic phone appointment on 07/01/2021 (manual) to recheck fluid levels.  91 day device clinic remote transmission scheduled 09/25/2021.   ?  ?EP/Cardiology Office Visits:  12/05/2021 with Dr 12/07/2021.  09/20/2021 with Dr 11/20/2021.    ?  ?Copy of ICM check sent to Dr. Johney Frame.  Will send to Dr Johney Frame for review if patient is reached.  ? ?3 month ICM trend: 06/24/2021. ? ? ? ?12-14 Month ICM trend:  ? ? ? ?08/24/2021, RN ?06/24/2021 ?1:19 PM ? ?

## 2021-06-25 ENCOUNTER — Telehealth: Payer: Self-pay

## 2021-06-25 NOTE — Telephone Encounter (Signed)
Remote ICM transmission received.  Attempted call to patient regarding ICM remote transmission and left detailed message per DPR.  Advised to return call for any fluid symptoms or questions. Next ICM remote transmission scheduled 07/01/2021.   ? ?

## 2021-06-26 ENCOUNTER — Ambulatory Visit (INDEPENDENT_AMBULATORY_CARE_PROVIDER_SITE_OTHER): Payer: Medicare Other

## 2021-06-26 DIAGNOSIS — I5022 Chronic systolic (congestive) heart failure: Secondary | ICD-10-CM

## 2021-06-26 DIAGNOSIS — I255 Ischemic cardiomyopathy: Secondary | ICD-10-CM

## 2021-06-27 LAB — CUP PACEART REMOTE DEVICE CHECK
Battery Remaining Longevity: 41 mo
Battery Voltage: 2.95 V
Brady Statistic AP VP Percent: 10.03 %
Brady Statistic AP VS Percent: 0.17 %
Brady Statistic AS VP Percent: 83.58 %
Brady Statistic AS VS Percent: 6.22 %
Brady Statistic RA Percent Paced: 9.86 %
Brady Statistic RV Percent Paced: 4.66 %
Date Time Interrogation Session: 20230510022723
HighPow Impedance: 110 Ohm
Implantable Lead Implant Date: 20150416
Implantable Lead Implant Date: 20150922
Implantable Lead Implant Date: 20150922
Implantable Lead Location: 753858
Implantable Lead Location: 753859
Implantable Lead Location: 753860
Implantable Lead Model: 4598
Implantable Lead Model: 5076
Implantable Lead Model: 6935
Implantable Pulse Generator Implant Date: 20210312
Lead Channel Impedance Value: 151.406
Lead Channel Impedance Value: 161.5 Ohm
Lead Channel Impedance Value: 172.5 Ohm
Lead Channel Impedance Value: 185.725
Lead Channel Impedance Value: 185.725
Lead Channel Impedance Value: 266 Ohm
Lead Channel Impedance Value: 285 Ohm
Lead Channel Impedance Value: 323 Ohm
Lead Channel Impedance Value: 323 Ohm
Lead Channel Impedance Value: 380 Ohm
Lead Channel Impedance Value: 380 Ohm
Lead Channel Impedance Value: 399 Ohm
Lead Channel Impedance Value: 437 Ohm
Lead Channel Impedance Value: 494 Ohm
Lead Channel Impedance Value: 513 Ohm
Lead Channel Impedance Value: 570 Ohm
Lead Channel Impedance Value: 608 Ohm
Lead Channel Impedance Value: 665 Ohm
Lead Channel Pacing Threshold Amplitude: 0.5 V
Lead Channel Pacing Threshold Amplitude: 1 V
Lead Channel Pacing Threshold Amplitude: 1.125 V
Lead Channel Pacing Threshold Pulse Width: 0.4 ms
Lead Channel Pacing Threshold Pulse Width: 0.4 ms
Lead Channel Pacing Threshold Pulse Width: 0.4 ms
Lead Channel Sensing Intrinsic Amplitude: 3 mV
Lead Channel Sensing Intrinsic Amplitude: 3 mV
Lead Channel Sensing Intrinsic Amplitude: 5.75 mV
Lead Channel Sensing Intrinsic Amplitude: 5.75 mV
Lead Channel Setting Pacing Amplitude: 1.5 V
Lead Channel Setting Pacing Amplitude: 2 V
Lead Channel Setting Pacing Amplitude: 3.75 V
Lead Channel Setting Pacing Pulse Width: 0.4 ms
Lead Channel Setting Pacing Pulse Width: 0.4 ms
Lead Channel Setting Sensing Sensitivity: 0.3 mV

## 2021-07-01 ENCOUNTER — Ambulatory Visit (INDEPENDENT_AMBULATORY_CARE_PROVIDER_SITE_OTHER): Payer: Medicare Other

## 2021-07-01 DIAGNOSIS — I5022 Chronic systolic (congestive) heart failure: Secondary | ICD-10-CM

## 2021-07-01 DIAGNOSIS — Z95810 Presence of automatic (implantable) cardiac defibrillator: Secondary | ICD-10-CM

## 2021-07-01 NOTE — Progress Notes (Signed)
EPIC Encounter for ICM Monitoring ? ?Patient Name: Becky [name] is a 71 y.o. female ?Date: 07/01/2021 ?Primary Care Physican: Emelda Fear, DO ?Primary Cardiologist: Branch ?Electrophysiologist: Allred ?BiV Pacing: 90.7%      ?04/19/2021 Weight: 164 lbs ?05/22/2021 Weight: 164 lbs  ?06/03/2021 Office Weight: 169 lbs ?  ?Time in AT/AF  0.0 hr/day (0.0%) ?  ?  ?Spoke with patient and heart failure questions reviewed.  Pt asymptomatic for fluid accumulation.  Reports feeling well at this time and voices no complaints.  ?  ?Optivol Thoracic impedance suggesting possible fluid accumulation starting 4/17 with exception of 5/2 at baseline (Furosemide dosage decreased 4/17). ?  ?Prescribed:  ?Furosemide 20 mg Take 1 tablet (20 mg total) by mouth daily. (May take an additional 20mg  as needed for swelling.) ?  ?Labs: ?11/28/2020 Creatinine 1.58, BUN 12, Potassium 5.0, Sodium 141, GFR 33 ?06/11/2020 Creatinine 1.80, BUN 14, Potassium 3.9, Sodium 140, GFR 30 ?A complete set of results can be found in Results Review. ?  ?Recommendations:   Advised to take extra 20 mg Furosemide daily x 3 days.  Advised to limit salt intake.  Copy sent to Dr Harl Bowie for review and recommendations if needed. ?  ?Follow-up plan: ICM clinic phone appointment on 07/08/2021 (manual) to recheck fluid levels.  91 day device clinic remote transmission scheduled 09/25/2021.   ?  ?EP/Cardiology Office Visits:  12/05/2021 with Dr Harl Bowie.  09/20/2021 with Dr Rayann Heman.    ?  ?Copy of ICM check sent to Dr. Rayann Heman.  Will send to Dr Harl Bowie for review if patient is reached.  ?  ?3 month ICM trend: 07/01/2021. ? ? ? ?12-14 Month ICM trend:  ? ? ? ?Rosalene Billings, RN ?07/01/2021 ?1:48 PM ? ?

## 2021-07-03 NOTE — Progress Notes (Signed)
Returned call to patient regarding wearing life alert necklace.  Advised she can wear life alert without it interfering with her device.  She appreciated call back. ?

## 2021-07-08 ENCOUNTER — Ambulatory Visit (INDEPENDENT_AMBULATORY_CARE_PROVIDER_SITE_OTHER): Payer: Medicare Other

## 2021-07-08 DIAGNOSIS — I5022 Chronic systolic (congestive) heart failure: Secondary | ICD-10-CM

## 2021-07-08 DIAGNOSIS — Z95810 Presence of automatic (implantable) cardiac defibrillator: Secondary | ICD-10-CM

## 2021-07-08 NOTE — Progress Notes (Signed)
Remote ICD transmission.   

## 2021-07-09 NOTE — Progress Notes (Signed)
EPIC Encounter for ICM Monitoring  Patient Name: Becky [name] is a 71 y.o. female Date: 07/09/2021 Primary Care Physican: Emelda Fear, DO Primary Cardiologist: Harl Bowie Electrophysiologist: Allred BiV Pacing: 89.1%      04/19/2021 Weight: 164 lbs 05/22/2021 Weight: 164 lbs  06/03/2021 Office Weight: 169 lbs 07/02/2021 Office Weight: 170 lbs   Time in AT/AF  0.0 hr/day (0.0%)     Spoke with patient and heart failure questions reviewed.  Pt asymptomatic for fluid accumulation.  Does not weight at home and encouraged to weigh daily.  She does not follow low salt diet.    Optivol Thoracic impedance suggesting fluid levels returned closer to normal after taking several days of extra Furosemide.  Impedance suggesting possible fluid accumulation starting 4/17 with exception of 5/2 at baseline (Furosemide dosage decreased 4/17).    Unable to maintain normal fluid levels since 4/17.   Prescribed:  Furosemide 20 mg Take 1 tablet (20 mg total) by mouth daily. (May take an additional 20mg  as needed for swelling.)   Labs: 11/28/2020 Creatinine 1.58, BUN 12, Potassium 5.0, Sodium 141, GFR 33 06/11/2020 Creatinine 1.80, BUN 14, Potassium 3.9, Sodium 140, GFR 30 A complete set of results can be found in Results Review.   Recommendations:   Advised to take extra 20 mg Furosemide daily x 2 days then return to prescribed dosage.  Advised to limit salt intake.     Follow-up plan: ICM clinic phone appointment on 07/16/2021 to recheck fluid levels.  91 day device clinic remote transmission scheduled 09/25/2021.     EP/Cardiology Office Visits:  12/05/2021 with Dr Harl Bowie.  09/20/2021 with Dr Rayann Heman.      Copy of ICM check sent to Dr. Rayann Heman and Dr Harl Bowie as FYI  3 month ICM trend: 07/09/2021.    12-14 Month ICM trend:     Rosalene Billings, RN 07/09/2021 4:17 PM

## 2021-07-16 ENCOUNTER — Ambulatory Visit (INDEPENDENT_AMBULATORY_CARE_PROVIDER_SITE_OTHER): Payer: Medicare Other

## 2021-07-16 DIAGNOSIS — Z95810 Presence of automatic (implantable) cardiac defibrillator: Secondary | ICD-10-CM

## 2021-07-16 DIAGNOSIS — I5022 Chronic systolic (congestive) heart failure: Secondary | ICD-10-CM

## 2021-07-17 NOTE — Progress Notes (Unsigned)
EPIC Encounter for ICM Monitoring  Patient Name: Becky [name] is a 71 y.o. female Date: 07/17/2021 Primary Care Physican: Lorelei Pont, DO Primary Cardiologist: Wyline Mood Electrophysiologist: Allred BiV Pacing: 89.5%      04/19/2021 Weight: 164 lbs 05/22/2021 Weight: 164 lbs  06/03/2021 Office Weight: 169 lbs 07/02/2021 Office Weight: 170 lbs 07/17/2021 Weight: Not weighing at home.   Time in AT/AF  0.0 hr/day (0.0%)     Spoke with patient and heart failure questions reviewed.  Pt asymptomatic for fluid accumulation.  Does not weight at home and encouraged to weigh daily.   Eats restaurant foods twice a week and does not follow low salt diet at home.  Encouraged to weigh at home.   Optivol Thoracic impedance suggesting possible fluid accumulation continues after taking extra Furosemide x 2 days last week.   Unable to maintain normal fluid levels since 4/17.   Prescribed:  Furosemide 20 mg Take 1 tablet (20 mg total) by mouth daily. (May take an additional 20mg  as needed for swelling.)   Labs: 11/28/2020 Creatinine 1.58, BUN 12, Potassium 5.0, Sodium 141, GFR 33 06/11/2020 Creatinine 1.80, BUN 14, Potassium 3.9, Sodium 140, GFR 30 A complete set of results can be found in Results Review.   Recommendations:   Explained importance of limiting salt.  Per prescription advised to take extra 20 mg Furosemide daily x 3 days then return to prescribed dosage.     Follow-up plan: ICM clinic phone appointment on 07/23/2021 to recheck fluid levels.  91 day device clinic remote transmission scheduled 09/25/2021.     EP/Cardiology Office Visits:  12/05/2021 with Dr 12/07/2021.  09/20/2021 with Dr 11/20/2021.      Copy of ICM check sent to Dr. Johney Frame and Dr Johney Frame for review and recommendations if needed.  3 month ICM trend: 07/16/2021.    12-14 Month ICM trend:     07/18/2021, RN 07/17/2021 10:20 AM

## 2021-07-19 ENCOUNTER — Other Ambulatory Visit: Payer: Self-pay | Admitting: Cardiology

## 2021-07-23 ENCOUNTER — Ambulatory Visit (INDEPENDENT_AMBULATORY_CARE_PROVIDER_SITE_OTHER): Payer: Medicare Other

## 2021-07-23 DIAGNOSIS — Z95810 Presence of automatic (implantable) cardiac defibrillator: Secondary | ICD-10-CM

## 2021-07-23 DIAGNOSIS — I5022 Chronic systolic (congestive) heart failure: Secondary | ICD-10-CM

## 2021-07-23 NOTE — Progress Notes (Signed)
EPIC Encounter for ICM Monitoring  Patient Name: Becky [name] is a 71 y.o. female Date: 07/23/2021 Primary Care Physican: Lorelei Pont, DO Primary Cardiologist: Wyline Mood Electrophysiologist: Allred BiV Pacing: 92.4%      04/19/2021 Weight: 164 lbs 05/22/2021 Weight: 164 lbs  06/03/2021 Office Weight: 169 lbs 07/02/2021 Office Weight: 170 lbs 07/17/2021 Weight: Not weighing at home.   Time in AT/AF  0.0 hr/day (0.0%)     Spoke with patient and heart failure questions reviewed.  Pt asymptomatic for fluid accumulation.  She has cut back on salt intake.   Optivol Thoracic impedance suggesting fluid levels returned close to normal after taking extra Furosemide.   Prescribed:  Furosemide 20 mg Take 1 tablet (20 mg total) by mouth daily. (May take an additional 20mg  as needed for swelling.)   Labs: 11/28/2020 Creatinine 1.58, BUN 12, Potassium 5.0, Sodium 141, GFR 33 06/11/2020 Creatinine 1.80, BUN 14, Potassium 3.9, Sodium 140, GFR 30 A complete set of results can be found in Results Review.   Recommendations:  Recommendation to limit salt intake to 2000 mg daily and fluid intake to 64 oz daily.  Encouraged to call if experiencing any fluid symptoms.      Follow-up plan: ICM clinic phone appointment on 08/05/2021.  91 day device clinic remote transmission scheduled 09/25/2021.     EP/Cardiology Office Visits:  12/05/2021 with Dr 12/07/2021.  09/20/2021 with Dr 11/20/2021.      Copy of ICM check sent to Dr. Johney Frame.   3 month ICM trend: 07/23/2021.    12-14 Month ICM trend:     09/22/2021, RN 07/23/2021 5:16 PM

## 2021-08-05 ENCOUNTER — Ambulatory Visit (INDEPENDENT_AMBULATORY_CARE_PROVIDER_SITE_OTHER): Payer: Medicare Other

## 2021-08-05 DIAGNOSIS — Z95810 Presence of automatic (implantable) cardiac defibrillator: Secondary | ICD-10-CM

## 2021-08-05 DIAGNOSIS — I5022 Chronic systolic (congestive) heart failure: Secondary | ICD-10-CM | POA: Diagnosis not present

## 2021-08-05 NOTE — Progress Notes (Signed)
EPIC Encounter for ICM Monitoring  Patient Name: Becky [name] is a 71 y.o. female Date: 08/05/2021 Primary Care Physican: Lorelei Pont, DO Primary Cardiologist: Wyline Mood Electrophysiologist: Allred BiV Pacing: 92.8%      04/19/2021 Weight: 164 lbs 05/22/2021 Weight: 164 lbs  06/03/2021 Office Weight: 169 lbs 07/02/2021 Office Weight: 170 lbs 07/17/2021 Weight: Not weighing at home.   Time in AT/AF  0.0 hr/day (0.0%)     Spoke with patient and heart failure questions reviewed.  Pt is asymptomatic and not weighting at home.  She eats restaurant foods 2-3 times a week and not following recommended low salt diet.  She may be drinking > 64 oz fluid daily.    Optivol Thoracic impedance suggesting ongoing possible fluid accumulation continues even after taking extra Furosemide x 3 days.   Unable to maintain normal fluid levels since 4/17.  Fluid index greater than normal threshold starting 4/12.   Prescribed:  Furosemide 20 mg Take 1 tablet (20 mg total) by mouth daily. (May take an additional 20mg  as needed for swelling.)   Labs: 03/11/2021 Creatinine 1.64, BUN 17, Potassium 5.1, Sodium 139, GFR 31 11/28/2020 Creatinine 1.58, BUN 12, Potassium 5.0, Sodium 141, GFR 33 06/11/2020 Creatinine 1.80, BUN 14, Potassium 3.9, Sodium 140, GFR 30 A complete set of results can be found in Results Review.   Recommendations:  She has been taking 40 mg Furosemide 3 times a week for several weeks but Optivol remains abnormal.  Discussed the need for limiting salt and fluid intake.    Follow-up plan: ICM clinic phone appointment on 08/12/2021 to recheck fluid levels.  91 day device clinic remote transmission scheduled 09/25/2021.     EP/Cardiology Office Visits:  12/05/2021 with Dr 12/07/2021.  09/20/2021 with Dr 11/20/2021.      Copy of ICM check sent to Dr. Johney Frame and Dr Johney Frame for review and recommendations if needed.  3 month ICM trend: 08/05/2021.    12-14 Month ICM trend:     08/07/2021,  RN 08/05/2021 11:13 AM

## 2021-08-12 ENCOUNTER — Ambulatory Visit (INDEPENDENT_AMBULATORY_CARE_PROVIDER_SITE_OTHER): Payer: Medicare Other

## 2021-08-12 DIAGNOSIS — Z95810 Presence of automatic (implantable) cardiac defibrillator: Secondary | ICD-10-CM

## 2021-08-12 DIAGNOSIS — I5022 Chronic systolic (congestive) heart failure: Secondary | ICD-10-CM

## 2021-08-13 ENCOUNTER — Telehealth: Payer: Self-pay

## 2021-08-13 NOTE — Progress Notes (Signed)
EPIC Encounter for ICM Monitoring  Patient Name: Becky [name] is a 71 y.o. female Date: 08/13/2021 Primary Care Physican: Lorelei Pont, DO Primary Cardiologist: Wyline Mood Electrophysiologist: Allred BiV Pacing: 89.6%      04/19/2021 Weight: 164 lbs 05/22/2021 Weight: 164 lbs  06/03/2021 Office Weight: 169 lbs 07/02/2021 Office Weight: 170 lbs 07/17/2021 Weight: Not weighing at home.   Time in AT/AF  0.0 hr/day (0.0%)     Attempted call to patient and unable to reach. Transmission reviewed.     Optivol Thoracic impedance suggesting fluid levels returned to normal 6/26.   Unable to maintain normal fluid levels since 4/17.  Fluid index greater than normal threshold starting 4/12.   Prescribed:  Furosemide 20 mg Take 1 tablet (20 mg total) by mouth daily. (May take an additional 20mg  as needed for swelling.)   Labs: 03/11/2021 Creatinine 1.64, BUN 17, Potassium 5.1, Sodium 139, GFR 31 11/28/2020 Creatinine 1.58, BUN 12, Potassium 5.0, Sodium 141, GFR 33 06/11/2020 Creatinine 1.80, BUN 14, Potassium 3.9, Sodium 140, GFR 30 A complete set of results can be found in Results Review.   Recommendations:  Unable to reach.     Follow-up plan: ICM clinic phone appointment on 09/16/2021.  91 day device clinic remote transmission scheduled 09/25/2021.     EP/Cardiology Office Visits:  12/05/2021 with Dr Wyline Mood.  09/20/2021 with Dr Johney Frame.      Copy of ICM check sent to Dr. Johney Frame.   3 month ICM trend: 08/12/2021.    12-14 Month ICM trend:     Karie Soda, RN 08/13/2021 4:07 PM

## 2021-09-16 ENCOUNTER — Ambulatory Visit (INDEPENDENT_AMBULATORY_CARE_PROVIDER_SITE_OTHER): Payer: Medicare Other

## 2021-09-16 DIAGNOSIS — I5022 Chronic systolic (congestive) heart failure: Secondary | ICD-10-CM | POA: Diagnosis not present

## 2021-09-16 DIAGNOSIS — Z95810 Presence of automatic (implantable) cardiac defibrillator: Secondary | ICD-10-CM | POA: Diagnosis not present

## 2021-09-17 ENCOUNTER — Other Ambulatory Visit: Payer: Self-pay | Admitting: Cardiology

## 2021-09-19 ENCOUNTER — Telehealth: Payer: Self-pay

## 2021-09-19 NOTE — Telephone Encounter (Signed)
Remote ICM transmission received.  Attempted call to patient regarding ICM remote transmission and no answer, mail box is full  

## 2021-09-19 NOTE — Progress Notes (Signed)
EPIC Encounter for ICM Monitoring  Patient Name: Becky [name] is a 71 y.o. female Date: 09/19/2021 Primary Care Physican: Lorelei Pont, DO Primary Cardiologist: Wyline Mood Electrophysiologist: Allred BiV Pacing: 92.8%      04/19/2021 Weight: 164 lbs 05/22/2021 Weight: 164 lbs  06/03/2021 Office Weight: 169 lbs 07/02/2021 Office Weight: 170 lbs 07/17/2021 Weight: Not weighing at home.   Time in AT/AF  0.0 hr/day (0.0%)     Attempted call to patient and unable to reach. Transmission reviewed.     Optivol Thoracic impedance suggesting normal fluid levels.   Prescribed:  Furosemide 20 mg Take 1 tablet (20 mg total) by mouth daily. (May take an additional 20mg  as needed for swelling.)   Labs: 03/11/2021 Creatinine 1.64, BUN 17, Potassium 5.1, Sodium 139, GFR 31 11/28/2020 Creatinine 1.58, BUN 12, Potassium 5.0, Sodium 141, GFR 33 06/11/2020 Creatinine 1.80, BUN 14, Potassium 3.9, Sodium 140, GFR 30 A complete set of results can be found in Results Review.   Recommendations:  Unable to reach.     Follow-up plan: ICM clinic phone appointment on 10/22/2021.  91 day device clinic remote transmission scheduled 09/25/2021.     EP/Cardiology Office Visits:  12/05/2021 with Dr 12/07/2021.  09/20/2021 with Dr 11/20/2021.      Copy of ICM check sent to Dr. Johney Frame.    3 month ICM trend: 09/16/2021.    12-14 Month ICM trend:     09/18/2021, RN 09/19/2021 12:43 PM

## 2021-09-20 ENCOUNTER — Encounter: Payer: Self-pay | Admitting: Internal Medicine

## 2021-09-20 ENCOUNTER — Ambulatory Visit (INDEPENDENT_AMBULATORY_CARE_PROVIDER_SITE_OTHER): Payer: Medicare Other | Admitting: Internal Medicine

## 2021-09-20 VITALS — BP 130/70 | HR 78 | Ht 60.0 in | Wt 174.2 lb

## 2021-09-20 DIAGNOSIS — I255 Ischemic cardiomyopathy: Secondary | ICD-10-CM

## 2021-09-20 DIAGNOSIS — G4733 Obstructive sleep apnea (adult) (pediatric): Secondary | ICD-10-CM | POA: Diagnosis not present

## 2021-09-20 DIAGNOSIS — I5022 Chronic systolic (congestive) heart failure: Secondary | ICD-10-CM | POA: Diagnosis not present

## 2021-09-20 DIAGNOSIS — I10 Essential (primary) hypertension: Secondary | ICD-10-CM | POA: Diagnosis not present

## 2021-09-20 LAB — CUP PACEART INCLINIC DEVICE CHECK
Battery Remaining Longevity: 27 mo
Battery Voltage: 2.93 V
Brady Statistic AP VP Percent: 20.01 %
Brady Statistic AP VS Percent: 0.33 %
Brady Statistic AS VP Percent: 76.81 %
Brady Statistic AS VS Percent: 2.85 %
Brady Statistic RA Percent Paced: 19.74 %
Brady Statistic RV Percent Paced: 2.33 %
Date Time Interrogation Session: 20230804091953
HighPow Impedance: 82 Ohm
Implantable Lead Implant Date: 20150416
Implantable Lead Implant Date: 20150922
Implantable Lead Implant Date: 20150922
Implantable Lead Location: 753858
Implantable Lead Location: 753859
Implantable Lead Location: 753860
Implantable Lead Model: 4598
Implantable Lead Model: 5076
Implantable Lead Model: 6935
Implantable Pulse Generator Implant Date: 20210312
Lead Channel Impedance Value: 151.406
Lead Channel Impedance Value: 151.406
Lead Channel Impedance Value: 166.25 Ohm
Lead Channel Impedance Value: 166.25 Ohm
Lead Channel Impedance Value: 178.5 Ohm
Lead Channel Impedance Value: 285 Ohm
Lead Channel Impedance Value: 285 Ohm
Lead Channel Impedance Value: 323 Ohm
Lead Channel Impedance Value: 323 Ohm
Lead Channel Impedance Value: 323 Ohm
Lead Channel Impedance Value: 323 Ohm
Lead Channel Impedance Value: 399 Ohm
Lead Channel Impedance Value: 399 Ohm
Lead Channel Impedance Value: 494 Ohm
Lead Channel Impedance Value: 513 Ohm
Lead Channel Impedance Value: 513 Ohm
Lead Channel Impedance Value: 551 Ohm
Lead Channel Impedance Value: 608 Ohm
Lead Channel Pacing Threshold Amplitude: 0.5 V
Lead Channel Pacing Threshold Amplitude: 1 V
Lead Channel Pacing Threshold Amplitude: 1.25 V
Lead Channel Pacing Threshold Pulse Width: 0.4 ms
Lead Channel Pacing Threshold Pulse Width: 0.4 ms
Lead Channel Pacing Threshold Pulse Width: 0.4 ms
Lead Channel Sensing Intrinsic Amplitude: 10 mV
Lead Channel Sensing Intrinsic Amplitude: 2.375 mV
Lead Channel Sensing Intrinsic Amplitude: 5.875 mV
Lead Channel Sensing Intrinsic Amplitude: 6 mV
Lead Channel Setting Pacing Amplitude: 1.5 V
Lead Channel Setting Pacing Amplitude: 2 V
Lead Channel Setting Pacing Amplitude: 5 V
Lead Channel Setting Pacing Pulse Width: 0.4 ms
Lead Channel Setting Pacing Pulse Width: 0.4 ms
Lead Channel Setting Sensing Sensitivity: 0.3 mV

## 2021-09-20 NOTE — Progress Notes (Signed)
PCP: Lorelei Pont, DO Primary Cardiologist: Dr Wyline Mood Primary EP: Dr Lafayette Dragon Becky [name] is a 71 y.o. female who presents today for routine electrophysiology followup.  Since last being seen in our clinic, the patient reports doing very well.  Today, she denies symptoms of palpitations, chest pain, shortness of breath,  lower extremity edema, dizziness, presyncope, syncope, or ICD shocks.  Her primary concern is with poor vision. The patient is otherwise without complaint today.   Past Medical History:  Diagnosis Date   AICD (automatic cardioverter/defibrillator) present    Anxiety    Arthritis    "all my joints; neck, back, legs, arms, elbows"  (10/03/2015)   Asthma    CAD (coronary artery disease)    a. 5/08: s/p DES to PDA and DES to LAD; b. 1/09: s/p DES to pLAD; c. 1/11: s/p DES to LAD x 2, d. LHC (3/12 in [place] - EF 55%, mild ant HK, nl LM, LAD stents ok, oLAD 30, RCA stents ok;  e.  4/13:  s/p DES to RCA, f. 1/14: dLM 20-30, mOM3 20-30, mLAD 80 ISR => s/p 2.5x16 mm Promus Element DES; EF 45-50%    CHF (congestive heart failure) (HCC)    Chronic bronchitis (HCC)    Chronic lower back pain    "into pelvis region" (10/03/2015)   COPD (chronic obstructive pulmonary disease) (HCC)    Daily headache    "might be from sinus" (10/03/2015)   Depression    hx (10/03/2015)   Fibromyalgia    GERD (gastroesophageal reflux disease)    Heart murmur    HLD (hyperlipidemia)    Hypertension    Ischemic cardiomyopathy    a. Echo (11/14):  EF 25%    LBBB (left bundle branch block)    Myocardial infarction (HCC) 12/2012   OSA on CPAP    Pneumonia 1950s   Syncope 11/14   LifeVest placed   Upper back pain, chronic    "into my shoulders; fibromyalgia works here" (10/03/2015)   Past Surgical History:  Procedure Laterality Date   BI-VENTRICULAR IMPLANTABLE CARDIOVERTER DEFIBRILLATOR N/A 06/02/2013   Procedure: BI-VENTRICULAR IMPLANTABLE CARDIOVERTER DEFIBRILLATOR  (CRT-D);   Surgeon: Gardiner Rhyme, MD;  Location: [facility] CATH LAB;  Service: Cardiovascular;  Laterality: N/A;   BI-VENTRICULAR IMPLANTABLE CARDIOVERTER DEFIBRILLATOR  (CRT-D)  06/02/2013   MDT VivaQuad CRTD implanted by Dr Johney Frame for ICM, CHF   BIV ICD GENERATOR CHANGEOUT N/A 04/29/2019   Procedure: BIV ICD GENERATOR CHANGEOUT;  Surgeon: Hillis Range, MD;  Location: [facility] INVASIVE CV LAB;  Service: Cardiovascular;  Laterality: N/A;   CARDIAC CATHETERIZATION  12/2012   S/P MI   CORONARY ANGIOPLASTY WITH STENT PLACEMENT  2007; 2009; 2011   "?2 +1 +1 "   CORONARY ANGIOPLASTY WITH STENT PLACEMENT  02/2012   FRACTURE SURGERY     HYSTEROSCOPY W/ ENDOMETRIAL ABLATION  ~ 1998   LEAD REVISION  11-08-13   RA and LV lead revision by Dr Johney Frame   LEAD REVISION Left 11/08/2013   Procedure: LEAD REVISION;  Surgeon: Gardiner Rhyme, MD;  Location[facility] CATH LAB;  Service: Cardiovascular;  Laterality: Left;   LEFT HEART CATHETERIZATION WITH CORONARY ANGIOGRAM N/A 01/03/2013   Procedure: LEFT HEART CATHETERIZATION WITH CORONARY ANGIOGRAM;  Surgeon: Kathleene Hazel, MD;  Location: [facility] CATH LAB;  Service: Cardiovascular;  Laterality: N/A;   ORIF ANKLE FRACTURE Right 10/21/2009   TUBAL LIGATION  1989    ROS- all systems are reviewed and negative except as per HPI  above  Current Outpatient Medications  Medication Sig Dispense Refill   aspirin EC 81 MG tablet Take 1 tablet (81 mg total) by mouth daily.     Aspirin-Salicylamide-Caffeine (BC HEADACHE PO) Take 1 packet by mouth daily as needed (pain.).      b complex vitamins capsule Take 1 capsule by mouth daily.     cetirizine (ZYRTEC) 10 MG tablet Take 10 mg by mouth at bedtime.   4   cholecalciferol (VITAMIN D3) 25 MCG (1000 UNIT) tablet Take 1,000 Units by mouth daily.     clopidogrel (PLAVIX) 75 MG tablet TAKE 1 TABLET BY MOUTH EVERY DAY 30 tablet 6   escitalopram (LEXAPRO) 20 MG tablet Take 20 mg by mouth daily.      ezetimibe (ZETIA) 10 MG tablet Take 1 tablet (10 mg  total) by mouth daily. 30 tablet 6   FARXIGA 5 MG TABS tablet Take 5 mg by mouth daily.     fluticasone (FLONASE) 50 MCG/ACT nasal spray Place 2 sprays into both nostrils at bedtime.   7   formoterol (PERFOROMIST) 20 MCG/2ML nebulizer solution Take 20 mcg by nebulization 2 (two) times daily.     furosemide (LASIX) 20 MG tablet Take 1 tablet (20 mg total) by mouth daily. (May take an additional 20mg  as needed for swelling.) 120 tablet 3   gabapentin (NEURONTIN) 300 MG capsule Take 300 mg by mouth 3 (three) times daily.      glipiZIDE (GLUCOTROL XL) 5 MG 24 hr tablet Take 5 mg by mouth daily.     ipratropium-albuterol (DUONEB) 0.5-2.5 (3) MG/3ML SOLN Take 3 mLs by nebulization every 6 (six) hours as needed (shortness of breath).     lactulose (CHRONULAC) 10 GM/15ML solution Take 10 g by mouth 2 (two) times daily as needed for mild constipation.     metFORMIN (GLUCOPHAGE) 500 MG tablet Take 1,000 mg by mouth 2 (two) times daily with a meal.      metoprolol succinate (TOPROL-XL) 50 MG 24 hr tablet TAKE 1 TABLET BY MOUTH DAILY WITH OR IMMEDIATELY FOLLOWING A MEAL 90 tablet 3   nitroGLYCERIN (NITROSTAT) 0.4 MG SL tablet Place 1 tablet (0.4 mg total) under the tongue every 5 (five) minutes x 3 doses as needed. If no relief after 3rd dose, proceed to ED 25 tablet 5   pantoprazole (PROTONIX) 40 MG tablet TAKE 1 TABLET(40 MG) BY MOUTH DAILY 90 tablet 1   pravastatin (PRAVACHOL) 80 MG tablet TAKE 1 TABLET(80 MG) BY MOUTH EVERY EVENING 90 tablet 2   rOPINIRole (REQUIP) 0.5 MG tablet Take 1 mg by mouth at bedtime.      spironolactone (ALDACTONE) 25 MG tablet TAKE 1/2 TABLET(12.5 MG) BY MOUTH DAILY 45 tablet 2   VENTOLIN HFA 108 (90 Base) MCG/ACT inhaler Inhale 1-2 puffs into the lungs every 6 (six) hours as needed for wheezing or shortness of breath.      magnesium oxide (MAG-OX) 400 MG tablet Take 400 mg by mouth daily. (Patient not taking: Reported on 06/03/2021)     montelukast (SINGULAIR) 10 MG tablet Take  10 mg by mouth at bedtime.  (Patient not taking: Reported on 06/03/2021)     No current facility-administered medications for this visit.    Physical Exam: Vitals:   09/20/21 0855  BP: 130/70  Pulse: 78  SpO2: 97%  Weight: 174 lb 3.2 oz (79 kg)  Height: 5' (1.524 m)    GEN- The patient is well appearing, alert and oriented x 3 today.  Head- normocephalic, atraumatic Eyes-  Sclera clear, conjunctiva pink Ears- hearing intact Oropharynx- clear Lungs- Clear to ausculation bilaterally, normal work of breathing Chest- ICD pocket is well healed Heart- Regular rate and rhythm, no murmurs, rubs or gallops, PMI not laterally displaced GI- soft, NT, ND, + BS Extremities- no clubbing, cyanosis, or edema  ICD interrogation- reviewed in detail today,  See PACEART report  ekg tracing ordered today is personally reviewed and shows AV paced  Wt Readings from Last 3 Encounters:  09/20/21 174 lb 3.2 oz (79 kg)  06/03/21 166 lb 6.4 oz (75.5 kg)  12/04/20 167 lb (75.8 kg)    Assessment and Plan:  1.  Chronic systolic dysfunction/ ischemic CM/ LBBB euvolemic today EF  has improved with CRT Stable on an appropriate medical regimen Normal ICD function See Pace Art report No changes today she is not device dependant today followed in ICM device clinic  2. HTN Stable No change required today  3. OSA Compliance with CPAP advised  Risks, benefits and potential toxicities for medications prescribed and/or refilled reviewed with patient today.   Return in a year  Hillis Range MD, [facility] 09/20/2021 9:09 AM

## 2021-09-20 NOTE — Patient Instructions (Addendum)
Medication Instructions:  Continue all current medications.  Labwork: none  Testing/Procedures: none  Follow-Up: 1 year   Any Other Special Instructions Will Be Listed Below (If Applicable).  If you need a refill on your cardiac medications before your next appointment, please call your pharmacy.  

## 2021-09-25 ENCOUNTER — Ambulatory Visit (INDEPENDENT_AMBULATORY_CARE_PROVIDER_SITE_OTHER): Payer: Medicare Other

## 2021-09-25 ENCOUNTER — Telehealth: Payer: Self-pay | Admitting: Cardiology

## 2021-09-25 DIAGNOSIS — I255 Ischemic cardiomyopathy: Secondary | ICD-10-CM

## 2021-09-25 LAB — CUP PACEART REMOTE DEVICE CHECK
Battery Remaining Longevity: 31 mo
Battery Voltage: 2.93 V
Brady Statistic AP VP Percent: 15.04 %
Brady Statistic AP VS Percent: 0.23 %
Brady Statistic AS VP Percent: 81.46 %
Brady Statistic AS VS Percent: 3.27 %
Brady Statistic RA Percent Paced: 14.93 %
Brady Statistic RV Percent Paced: 2.03 %
Date Time Interrogation Session: 20230809042405
HighPow Impedance: 73 Ohm
Implantable Lead Implant Date: 20150416
Implantable Lead Implant Date: 20150922
Implantable Lead Implant Date: 20150922
Implantable Lead Location: 753858
Implantable Lead Location: 753859
Implantable Lead Location: 753860
Implantable Lead Model: 4598
Implantable Lead Model: 5076
Implantable Lead Model: 6935
Implantable Pulse Generator Implant Date: 20210312
Lead Channel Impedance Value: 151.406
Lead Channel Impedance Value: 151.406
Lead Channel Impedance Value: 162.857
Lead Channel Impedance Value: 162.857
Lead Channel Impedance Value: 174.595
Lead Channel Impedance Value: 266 Ohm
Lead Channel Impedance Value: 285 Ohm
Lead Channel Impedance Value: 285 Ohm
Lead Channel Impedance Value: 323 Ohm
Lead Channel Impedance Value: 323 Ohm
Lead Channel Impedance Value: 323 Ohm
Lead Channel Impedance Value: 380 Ohm
Lead Channel Impedance Value: 399 Ohm
Lead Channel Impedance Value: 494 Ohm
Lead Channel Impedance Value: 494 Ohm
Lead Channel Impedance Value: 570 Ohm
Lead Channel Impedance Value: 570 Ohm
Lead Channel Impedance Value: 608 Ohm
Lead Channel Pacing Threshold Amplitude: 0.625 V
Lead Channel Pacing Threshold Amplitude: 1 V
Lead Channel Pacing Threshold Amplitude: 1.625 V
Lead Channel Pacing Threshold Pulse Width: 0.4 ms
Lead Channel Pacing Threshold Pulse Width: 0.4 ms
Lead Channel Pacing Threshold Pulse Width: 0.4 ms
Lead Channel Sensing Intrinsic Amplitude: 3.125 mV
Lead Channel Sensing Intrinsic Amplitude: 3.125 mV
Lead Channel Sensing Intrinsic Amplitude: 9.75 mV
Lead Channel Sensing Intrinsic Amplitude: 9.75 mV
Lead Channel Setting Pacing Amplitude: 1.5 V
Lead Channel Setting Pacing Amplitude: 2 V
Lead Channel Setting Pacing Amplitude: 4.75 V
Lead Channel Setting Pacing Pulse Width: 0.4 ms
Lead Channel Setting Pacing Pulse Width: 0.4 ms
Lead Channel Setting Sensing Sensitivity: 0.3 mV

## 2021-09-25 NOTE — Telephone Encounter (Signed)
   Pre-operative Risk Assessment    Patient Name: Becky [name]  DOB: 04/26/1950 MRN: [id number]     Request for Surgical Clearance    Procedure:  Dental Extraction - Amount of Teeth to be Pulled:  2  Date of Surgery:  Clearance 10-30-21                                  Surgeon:  Dr Candace Gallus, DDS Surgeon's Group or Practice Name:   Phone number:  [phone] Fax number:  [phone]   Type of Clearance Requested:   - Pharmacy:  Hold Clopidogrel (Plavix)     Type of Anesthesia:  Lidocaine   Additional requests/questions:    Signed, Laurence Ferrari   09/25/2021, 2:54 PM

## 2021-09-26 NOTE — Telephone Encounter (Signed)
   Patient Name: Becky [name]  DOB: 09-16-1950 MRN: [id number]  Primary Cardiologist: Dina Rich, MD  Chart reviewed as part of pre-operative protocol coverage.   Simple dental extractions (i.e. 1-2 teeth) are considered low risk procedures per guidelines and generally do not require any specific cardiac clearance. It is also generally accepted that for simple extractions and dental cleanings, there is no need to interrupt blood thinner therapy. We do not typically stop Plavix for extraction of 2 teeth.  SBE prophylaxis is not required for the patient from a cardiac standpoint based on cardiology records.  I will route this recommendation to the requesting party via Epic fax function and remove from pre-op pool.  Please call with questions.  Laurann Montana, PA-C 09/26/2021, 8:53 AM

## 2021-10-22 ENCOUNTER — Ambulatory Visit (INDEPENDENT_AMBULATORY_CARE_PROVIDER_SITE_OTHER): Payer: Medicare Other

## 2021-10-22 DIAGNOSIS — Z95810 Presence of automatic (implantable) cardiac defibrillator: Secondary | ICD-10-CM | POA: Diagnosis not present

## 2021-10-22 DIAGNOSIS — I5022 Chronic systolic (congestive) heart failure: Secondary | ICD-10-CM

## 2021-10-25 NOTE — Progress Notes (Signed)
EPIC Encounter for ICM Monitoring  Patient Name: Becky [name] is a 71 y.o. female Date: 10/25/2021 Primary Care Physican: Lorelei Pont, DO Primary Cardiologist: Wyline Mood Electrophysiologist: Mealor BiV Pacing: 95.7%      04/19/2021 Weight: 164 lbs 05/22/2021 Weight: 164 lbs  06/03/2021 Office Weight: 169 lbs 07/02/2021 Office Weight: 170 lbs 09/20/2021 Office Weight: 174 lbs   Time in AT/AF  0.0 hr/day (0.0%)     Spoke with patient and heart failure questions reviewed.  Pt asymptomatic for fluid accumulation.  Reports feeling well at this time and voices no complaints.    Optivol Thoracic impedance suggesting normal fluid levels but does suggest intermittent days with possible fluid accumulation in last month.   Prescribed:  Furosemide 20 mg Take 1 tablet (20 mg total) by mouth daily. (May take an additional 20 mg as needed for swelling.)   Labs: 03/11/2021 Creatinine 1.64, BUN 17, Potassium 5.1, Sodium 139, GFR 31 11/28/2020 Creatinine 1.58, BUN 12, Potassium 5.0, Sodium 141, GFR 33 06/11/2020 Creatinine 1.80, BUN 14, Potassium 3.9, Sodium 140, GFR 30 A complete set of results can be found in Results Review.   Recommendations:  No changes and encouraged to call if experiencing any fluid symptoms.   Follow-up plan: ICM clinic phone appointment on 12/02/2021.  91 day device clinic remote transmission scheduled 12/25/2021.     EP/Cardiology Office Visits:  12/05/2021 with Dr Wyline Mood.        Copy of ICM check sent to Dr. Nelly Laurence.    3 month ICM trend: 10/22/2021.    12-14 Month ICM trend:     Karie Soda, RN 10/25/2021 12:48 PM

## 2021-10-29 NOTE — Progress Notes (Signed)
Remote ICD transmission.   

## 2021-12-02 ENCOUNTER — Ambulatory Visit (INDEPENDENT_AMBULATORY_CARE_PROVIDER_SITE_OTHER): Payer: Medicare Other

## 2021-12-02 DIAGNOSIS — I5022 Chronic systolic (congestive) heart failure: Secondary | ICD-10-CM | POA: Diagnosis not present

## 2021-12-02 DIAGNOSIS — Z95810 Presence of automatic (implantable) cardiac defibrillator: Secondary | ICD-10-CM

## 2021-12-02 NOTE — Progress Notes (Signed)
EPIC Encounter for ICM Monitoring  Patient Name: Becky [name] is a 71 y.o. female Date: 12/02/2021 Primary Care Physican: Emelda Fear, DO Primary Cardiologist: Harl Bowie Electrophysiologist: Mealor BiV Pacing: 95.9%      04/19/2021 Weight: 164 lbs 05/22/2021 Weight: 164 lbs  06/03/2021 Office Weight: 169 lbs 07/02/2021 Office Weight: 170 lbs 09/20/2021 Office Weight: 174 lbs 12/02/2021 Office Weight: 174 lbs   Time in AT/AF  0.0 hr/day (0.0%)     Spoke with patient and heart failure questions reviewed.  Pt asymptomatic for fluid accumulation.    DIET:  Nephrologist advised to drink 64 oz fluid daily but she is also drinking at least 4 cups of coffee, tea, soup and sodas.  She is drinking close to 128 oz fluid daily.  She eats lots of processed foods such as bologna, hot pockets and ham.  Explained heart recommendation for fluid intake is total of 64 oz fluid daily which is a total of all fluids including soups.  Reviewed some foods labels of processed foods she is eating resulting in salt intake more than recommended 2000 mg daily.     Optivol Thoracic impedance trending close to normal fluid but suggesting possible fluid accumulation starting 9/9.   Prescribed:  Furosemide 20 mg Take 1 tablet (20 mg total) by mouth daily. (May take an additional 20 mg as needed for swelling.   Labs: 11/25/2021 Creatinine 1.67, BUN 17, Potassium 3.7, Sodium 140, GFR 33 A complete set of results can be found in Results Review.   Recommendations:   Discussed food choices and encouraged to make changes in diet by limiting salt intake to 2000 mg daily, eating healthier foods and limiting all fluid intake to 64 oz daily.  Encouraged to talk with Dr Harl Bowie regarding diet and fluid intake recommendations.     Follow-up plan: ICM clinic phone appointment on 12/16/2021 to recheck fluid levels.  91 day device clinic remote transmission scheduled 12/25/2021.     EP/Cardiology Office Visits:  12/05/2021 with Dr  Harl Bowie.        Copy of ICM check sent to Dr. Myles Gip and Dr Harl Bowie as Juluis Rainier for 10/18 OV.       3 month ICM trend: 12/02/2021.    12-14 Month ICM trend:     Rosalene Billings, RN 12/02/2021 2:43 PM

## 2021-12-05 ENCOUNTER — Ambulatory Visit: Payer: Medicare Other | Attending: Cardiology | Admitting: Cardiology

## 2021-12-05 ENCOUNTER — Encounter: Payer: Self-pay | Admitting: Cardiology

## 2021-12-05 VITALS — BP 130/80 | HR 68 | Ht 60.0 in | Wt 173.4 lb

## 2021-12-05 DIAGNOSIS — I10 Essential (primary) hypertension: Secondary | ICD-10-CM

## 2021-12-05 DIAGNOSIS — I2510 Atherosclerotic heart disease of native coronary artery without angina pectoris: Secondary | ICD-10-CM

## 2021-12-05 DIAGNOSIS — I5022 Chronic systolic (congestive) heart failure: Secondary | ICD-10-CM

## 2021-12-05 DIAGNOSIS — I255 Ischemic cardiomyopathy: Secondary | ICD-10-CM

## 2021-12-05 DIAGNOSIS — E782 Mixed hyperlipidemia: Secondary | ICD-10-CM | POA: Diagnosis present

## 2021-12-05 NOTE — Patient Instructions (Addendum)
Medication Instructions:  Your physician recommends that you continue on your current medications as directed. Please refer to the Current Medication list given to you today.   Labwork: Fasting Lipid Panel (2 weeks at [facility])  Testing/Procedures: none  Follow-Up:  Your physician recommends that you schedule a follow-up appointment in: 6 months  Any Other Special Instructions Will Be Listed Below (If Applicable).  If you need a refill on your cardiac medications before your next appointment, please call your pharmacy.

## 2021-12-05 NOTE — Progress Notes (Signed)
Clinical Summary Becky [name] is a 71 y.o.female seen today for follow up of the following medical problems.   1. ICM/Chronic systolic heart failure  - history of multiple PCIs as described below, mainly in [place].   - 04/2013 LVEF 30-35%. Repeat echo after BiV AICD 05/2014 shows LVEF 63%, grade I diastolic dysfunction.   - 09/2015 echo LVEF 55-60%, no WMAs, grade I diastolic dysfunction - 14/9702 echo [place]: LVEF 65-70%, moderate LVH.  - medical therapy has been limited by orthostatic symtpoms. Not on ACE or ARB - she has been on long term plavix due to her extensive stent burden    04/2019 echo LVEF 55-60%  - no chest pains, no SOB/DOE - compliant with meds - 09/2021 normal device check      2. OSA    - compliant with cpap - looking to establish with new sleep medicien doctor, prior has left. She report pcp is working on this.    3. Hyperlipidemia - muscle aches on multiple statins, has tolerated pravastatin - labs followed by pcp   Jan 2023 TC 194 TG 165 HDL 52 LDL 109   - last visit we added zetia 41m daily      4. HTN -she is compliant with meds       5. [facility] followed by Dr BLuciana Axe      6. CKD - followed by nephrology      Past Medical History:  Diagnosis Date   AICD (automatic cardioverter/defibrillator) present    Anxiety    Arthritis    "all my joints; neck, back, legs, arms, elbows"  (10/03/2015)   Asthma    CAD (coronary artery disease)    a. 5/08: s/p DES to PDA and DES to LAD; b. 1/09: s/p DES to pLAD; c. 1/11: s/p DES to LAD x 2, d. [facility] ([place] [facility]- EF 55%, mild ant HK, nl LM, LAD stents ok, oLAD 30, RCA stents ok;  e.  4/13:  s/p DES to RCA, f. 1/14: dLM 20-30, mOM3 20-30, mLAD 80 ISR => s/p 2.5x16 mm Promus Element DES; EF 45-50%    CHF (congestive heart failure) (HCC)    Chronic bronchitis (HCC)    Chronic lower back pain    "into pelvis region" (10/03/2015)   COPD  (chronic obstructive pulmonary disease) (HCC)    Daily headache    "might be from sinus" (10/03/2015)   Depression    hx (10/03/2015)   Fibromyalgia    GERD (gastroesophageal reflux disease)    Heart murmur    HLD (hyperlipidemia)    Hypertension    Ischemic cardiomyopathy    a. Echo (11/14):  EF 25%    LBBB (left bundle Becky [name] block)    Myocardial infarction ([facility] 12/2012   OSA on CPAP    Pneumonia 1950s   Syncope 11/14   LifeVest placed   Upper back pain, chronic    "into my shoulders; fibromyalgia works here" (10/03/2015)     Allergies  Allergen Reactions   Tape Rash and Other (See Comments)    Severe Reaction (latex tape): Burned hand, Redness-took days to clear up (03/02/12)   Lipitor [Atorvastatin] Other (See Comments)    Leg pain   Prozac [Fluoxetine Hcl] Other (See Comments)    Mean/violent thoughts   Codeine Nausea And Vomiting   Fosamax [Alendronate Sodium] Nausea Only and Other (See Comments)    Stomach pain  Keflex [Cephalexin] Nausea Only     Current Outpatient Medications  Medication Sig Dispense Refill   aspirin EC 81 MG tablet Take 1 tablet (81 mg total) by mouth daily.     Aspirin-Salicylamide-Caffeine (BC HEADACHE PO) Take 1 packet by mouth daily as needed (pain.).      b complex vitamins capsule Take 1 capsule by mouth daily.     cetirizine (ZYRTEC) 10 MG tablet Take 10 mg by mouth at bedtime.   4   cholecalciferol (VITAMIN D3) 25 MCG (1000 UNIT) tablet Take 1,000 Units by mouth daily.     clopidogrel (PLAVIX) 75 MG tablet TAKE 1 TABLET BY MOUTH EVERY DAY 30 tablet 6   escitalopram (LEXAPRO) 20 MG tablet Take 20 mg by mouth daily.      ezetimibe (ZETIA) 10 MG tablet Take 1 tablet (10 mg total) by mouth daily. 30 tablet 6   FARXIGA 5 MG TABS tablet Take 5 mg by mouth daily.     fluticasone (FLONASE) 50 MCG/ACT nasal spray Place 2 sprays into both nostrils at bedtime.   7   formoterol (PERFOROMIST) 20 MCG/2ML nebulizer solution Take 20 mcg by  nebulization 2 (two) times daily.     furosemide (LASIX) 20 MG tablet Take 1 tablet (20 mg total) by mouth daily. (May take an additional 58m as needed for swelling.) 120 tablet 3   gabapentin (NEURONTIN) 300 MG capsule Take 300 mg by mouth 3 (three) times daily.      glipiZIDE (GLUCOTROL XL) 5 MG 24 hr tablet Take 5 mg by mouth daily.     ipratropium-albuterol (DUONEB) 0.5-2.5 (3) MG/3ML SOLN Take 3 mLs by nebulization every 6 (six) hours as needed (shortness of breath).     lactulose (CHRONULAC) 10 GM/15ML solution Take 10 g by mouth 2 (two) times daily as needed for mild constipation.     magnesium oxide (MAG-OX) 400 MG tablet Take 400 mg by mouth daily. (Patient not taking: Reported on 06/03/2021)     meclizine (ANTIVERT) 25 MG tablet Take 25 mg by mouth 3 (three) times daily.     metFORMIN (GLUCOPHAGE) 500 MG tablet Take 1,000 mg by mouth 2 (two) times daily with a meal.      metoprolol succinate (TOPROL-XL) 50 MG 24 hr tablet TAKE 1 TABLET BY MOUTH DAILY WITH OR IMMEDIATELY FOLLOWING A MEAL 90 tablet 3   montelukast (SINGULAIR) 10 MG tablet Take 10 mg by mouth at bedtime.  (Patient not taking: Reported on 06/03/2021)     nitroGLYCERIN (NITROSTAT) 0.4 MG SL tablet Place 1 tablet (0.4 mg total) under the tongue every 5 (five) minutes x 3 doses as needed. If no relief after 3rd dose, proceed to ED 25 tablet 5   pantoprazole (PROTONIX) 40 MG tablet TAKE 1 TABLET(40 MG) BY MOUTH DAILY 90 tablet 1   pravastatin (PRAVACHOL) 80 MG tablet TAKE 1 TABLET(80 MG) BY MOUTH EVERY EVENING 90 tablet 2   rOPINIRole (REQUIP) 0.5 MG tablet Take 1 mg by mouth at bedtime.      spironolactone (ALDACTONE) 25 MG tablet TAKE 1/2 TABLET(12.5 MG) BY MOUTH DAILY 45 tablet 2   VENTOLIN HFA 108 (90 Base) MCG/ACT inhaler Inhale 1-2 puffs into the lungs every 6 (six) hours as needed for wheezing or shortness of breath.      Vitamin D, Ergocalciferol, (DRISDOL) 1.25 MG (50000 UNIT) CAPS capsule Take 50,000 Units by mouth once a  week.     No current facility-administered medications for this visit.  Past Surgical History:  Procedure Laterality Date   BI-VENTRICULAR IMPLANTABLE CARDIOVERTER DEFIBRILLATOR N/A 06/02/2013   Procedure: BI-VENTRICULAR IMPLANTABLE CARDIOVERTER DEFIBRILLATOR  (CRT-D);  Surgeon: Coralyn Mark, MD;  Location: [facility] CATH LAB;  Service: Cardiovascular;  Laterality: N/A;   BI-VENTRICULAR IMPLANTABLE CARDIOVERTER DEFIBRILLATOR  (CRT-D)  06/02/2013   MDT VivaQuad CRTD implanted by Dr Rayann Heman for ICM, CHF   BIV ICD GENERATOR CHANGEOUT N/A 04/29/2019   Procedure: BIV ICD GENERATOR CHANGEOUT;  Surgeon: Thompson Grayer, MD;  Location: [place] CV LAB;  Service: Cardiovascular;  Laterality: N/A;   CARDIAC CATHETERIZATION  12/2012   S/P MI   CORONARY ANGIOPLASTY WITH STENT PLACEMENT  2007; 2009; 2011   "?2 +1 +1 "   CORONARY ANGIOPLASTY WITH STENT PLACEMENT  02/2012   FRACTURE SURGERY     HYSTEROSCOPY W/ ENDOMETRIAL ABLATION  ~ 1998   LEAD REVISION  11-08-13   RA and LV lead revision by Dr Rayann Heman   LEAD REVISION Left 11/08/2013   Procedure: LEAD REVISION;  Surgeon: Coralyn Mark, MD;  Location[facility] C[facility] LAB;  Service: Cardiovascular;  Laterality: Left;   LEFT HEART CATHETERIZATION WITH CORONARY ANGIOGRAM N/A 01/03/2013   Procedure: LEFT HEART CATHETERIZATION WITH CORONARY ANGIOGRAM;  Surgeon: Burnell Blanks, MD;  Location: [facility] CATH LAB;  Service: Cardiovascular;  Laterality: N/A;   ORIF ANKLE FRACTURE Right 10/21/2009   TUBAL LIGATION  1989     Allergies  Allergen Reactions   Tape Rash and Other (See Comments)    Severe Reaction (latex tape): Burned hand, Redness-took days to clear up (03/02/12)   Lipitor [Atorvastatin] Other (See Comments)    Leg pain   Prozac [Fluoxetine Hcl] Other (See Comments)    Mean/violent thoughts   Codeine Nausea And Vomiting   Fosamax [Alendronate Sodium] Nausea Only and Other (See Comments)    Stomach pain   Keflex [Cephalexin] Nausea Only      Family  History  Problem Relation Age of Onset   Breast cancer Mother    Heart failure Brother    Breast cancer Sister    Breast cancer Sister    Uterine cancer Sister      Social History Ms. Wachter reports that she has been smoking cigarettes. She started smoking about 30 years ago. She has a 10.00 pack-year smoking history. She has never used smokeless tobacco. Ms. Tschetter reports current alcohol use.   Review of Systems CONSTITUTIONAL: No weight loss, fever, chills, weakness or fatigue.  HEENT: Eyes: No visual loss, blurred vision, double vision or yellow sclerae.No hearing loss, sneezing, congestion, runny nose or sore throat.  SKIN: No rash or itching.  CARDIOVASCULAR: per hpi RESPIRATORY: No shortness of breath, cough or sputum.  GASTROINTESTINAL: No anorexia, nausea, vomiting or diarrhea. No abdominal pain or blood.  GENITOURINARY: No burning on urination, no polyuria NEUROLOGICAL: No headache, dizziness, syncope, paralysis, ataxia, numbness or tingling in the extremities. No change in bowel or bladder control.  MUSCULOSKELETAL: No muscle, back pain, joint pain or stiffness.  LYMPHATICS: No enlarged nodes. No history of splenectomy.  PSYCHIATRIC: No history of depression or anxiety.  ENDOCRINOLOGIC: No reports of sweating, cold or heat intolerance. No polyuria or polydipsia.  Marland Kitchen   Physical Examination Today's Vitals   12/05/21 0952  BP: 130/80  Pulse: 68  SpO2: 96%  Weight: 173 lb 6.4 oz (78.7 kg)  Height: 5' (1.524 m)   Body mass index is 33.86 kg/m.  Gen: resting comfortably, no acute distress HEENT: no scleral icterus, pupils equal round  and reactive, no palptable cervical adenopathy,  CV: RRR, no m/r/g no jvd Resp: Clear to auscultation bilaterally GI: abdomen is soft, non-tender, non-distended, normal bowel sounds, no hepatosplenomegaly MSK: extremities are warm, no edema.  Skin: warm, no rash Neuro:  no focal deficits Psych: appropriate affect   Diagnostic  Studies 04/2010 Cath [place], [place]: LVEF 55%, mild anterior hypokinesis. LM normal, LAD with stents in prox and mid portion widely patent, LAD ostial 30%. Diags with luminal irregs. LCX with luminial irregs. RCA with distal patent stents   Stent cards   May 2008 DES to PDA, DES to LAD   Jan 2009 DES to prox LAD   Mar 05 2009 DES to LAD x2   05/2011 DES to RCA   Jan 2014 stent done [place], [place]   03/2010 Echo: LVEF 40%, hypokinesis of the anteroseptum.    01/02/13 Cath   Hemodynamic Findings:   Central aortic pressure: 147/79   Left ventricular pressure: 130/22/26   Angiographic Findings:   Left main: 30% distal stenosis. It appears that the distal left main has a stent that continues into the LAD.   Left Anterior Descending Artery: Large vessel that courses to the apex. The entire proximal and mid LAD is stented. There is mild stent restenosis in the proximal segment. The mid stented segment has diffuse 30% stent restenosis. The distal vessel becomes small in caliber and has mild diffuse plaque. There is a moderate caliber first diagonal Sally Menard with mild plaque disease.   Circumflex Artery: Moderate caliber non-dominant vessel with three moderate caliber obtuse marginal branches. No obstructive disease.   Right Coronary Artery: Large dominant vessel with 30% proximal stenosis, heavily calcified mid vessel with patent stent in the mid vessel (no significant restenosis). The distal vessel has diffuse 40-50% stenosis. The posterolateral Bonnee Zertuche and PDA are moderate caliber, patent vessels with mild plaque disease.   Left Ventricular Angiogram: LVEF=25-30%. Hypokinesis of the antero-apical wall.   Impression:   1. 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 Elevated troponin following syncopal event. No culprit lesions seen.   3. Moderate to severe LV systolic dysfunction.   Recommendations: Continue medical management of CAD. Workup for syncope is underway. Carotid dopplers  today. Will check d-dimer as PE is a possibility. She could also have had an arrythmia with LV dysfunction.   Complications: None. The patient tolerated the procedure well.   01/03/13 Echo   - LVEF 25-30%, akinesis of the anteroseptal and apical myocardium, grade I diastolic dysfunction,   93/26/71 Carotid US   - The vertebral arteries appear patent with antegrade flow. - Findings consistent with1- 39 percent stenosis,high end of scale,involving the right internal carotid artery. - Findings consistent with 40 - 59 percent stenosis, low end of scale,involving the left internal carotid artery. - ICA/CCA ratio. right =1.72. left = 1.52 Other specific details can be found in the table(s) above. Prepared and Electronically Authenticated by  12/13/12 Event monitor   No symptoms reported, sinus rhythm with conduction delay, occasional PVCs.   04/2013 Echo   Study Conclusions  - Left ventricle: The cavity size was normal. Wall thickness was increased in a pattern of mild LVH with moderate basal septal hypertrophy. Systolic function was moderately to severely reduced. The estimated ejection fraction was in the range of 30% to 35%. There is akinesis to dyskinesis of the mid-distal anteroseptal and apical myocardium. There is akinesis of the distalinferoseptal myocardium. Doppler parameters are consistent with abnormal left ventricular relaxation (  grade 1 diastolic dysfunction). - Ventricular septum: Septal motion showed abnormal function and dyssynergy. - Aortic valve: Mildly calcified annulus. Probably trileaflet; mildly calcified leaflets. No significant regurgitation. - Mitral valve: Calcified annulus. Trivial regurgitation. - Left atrium: The atrium was mildly dilated. - Right atrium: Central venous pressure: 51m Hg (est). - Tricuspid valve: Trivial regurgitation. - Pulmonary arteries: PA peak pressure: 27mHg (S). - Pericardium, extracardiac: There was no  pericardial effusion. Impressions:  - MiLeedsith moderate basal septal hypertrophy, LVEF 30-35% with wall motion abnormalities as outlined, grade 1 diastolic dysfunction. Mild left atrial enlagement. MAC with trivial mitral regurgitation. MIldlysclerotic aortic valve. Trivial tricuspid regurgitation with normal PASP 20 mmHg.  05/2014 echo Study Conclusions  - Left ventricle: Technically limited study. The cavity size was   normal. Wall thickness was increased in a pattern of moderate   LVH. The estimated ejection fraction was 55%. Doppler parameters   are consistent with abnormal left ventricular relaxation (grade 1   diastolic dysfunction). - Aortic valve: Sclerosis without stenosis. - Right ventricle: The cavity size was normal. Pacer wire or   catheter noted in right ventricle. Systolic function was normal. - Impressions: Since the study in 2015, there is definite   improvement in LV function.  Impressions:  - Since the study in 2015, there is definite improvement in LV   function.     09/2015 Nuclear Stress There was no ST segment deviation noted during stress. No T wave inversion was noted during stress. Defect 1: There is a medium defect of mild severity present in the basal inferior and mid inferior location. This defect is likely a result of bowel loop attenuation artifact. No significant ischemia identified. This is a low risk study. No significant ischemia identified. Nuclear stress EF: 54%. Mid to apical septal wall hypokinesis noted     09/2015 echo Study Conclusions   - Left ventricle: The cavity size was normal. Systolic function was   normal. The estimated ejection fraction was in the range of 55%   to 60%. Wall motion was normal; there were no regional wall   motion abnormalities. Doppler parameters are consistent with   abnormal left ventricular relaxation (grade 1 diastolic   dysfunction). Doppler parameters are consistent with   indeterminate  ventricular filling pressure. - Aortic valve: Transvalvular velocity was within the normal range.   There was no stenosis. There was no regurgitation. - Mitral valve: Transvalvular velocity was within the normal range.   There was no evidence for stenosis. There was no regurgitation. - Left atrium: The atrium was moderately dilated. - Right ventricle: The cavity size was normal. Wall thickness was   normal. Systolic function was normal. - Tricuspid valve: There was no regurgitation.    Assessment and Plan   1. ICM/Chronic systolic heart failure   - previous LVEF 30-35%. After BiV AICD repeat echo with normalized LVEF - medical therapy has been limited due to orthstatic symptoms. ACE-I previously stopped during 09/2015 admission due to hypotension.  - indefinite plavix due to large stent burden   -no symptoms, continue current meds     2.HTN -she is at goal, continue current meds   3. Hyperlipidemia - difficulty tolerating statins in the past, has tolerated pravastatin -last visit we added zetia 1011maily as LDL was not at goal - repeat lipid panel      F/u 6 months   JonArnoldo Lenis.D.

## 2021-12-16 ENCOUNTER — Ambulatory Visit (INDEPENDENT_AMBULATORY_CARE_PROVIDER_SITE_OTHER): Payer: Medicare Other

## 2021-12-16 DIAGNOSIS — Z95810 Presence of automatic (implantable) cardiac defibrillator: Secondary | ICD-10-CM

## 2021-12-16 DIAGNOSIS — I5022 Chronic systolic (congestive) heart failure: Secondary | ICD-10-CM

## 2021-12-17 ENCOUNTER — Telehealth: Payer: Self-pay

## 2021-12-17 NOTE — Progress Notes (Signed)
EPIC Encounter for ICM Monitoring  Patient Name: Becky [name] is a 71 y.o. female Date: 12/17/2021 Primary Care Physican: Emelda Fear, DO Primary Cardiologist: Harl Bowie Electrophysiologist: Mealor BiV Pacing: 89.6%      04/19/2021 Weight: 164 lbs 05/22/2021 Weight: 164 lbs  06/03/2021 Office Weight: 169 lbs 07/02/2021 Office Weight: 170 lbs 09/20/2021 Office Weight: 174 lbs 12/02/2021 Office Weight: 174 lbs   Time in AT/AF  0.0 hr/day (0.0%)     Attempted call to patient and unable to reach.  Transmission reviewed.    DIET: Drinks close to 128 oz fluid daily and eating mostly foods high in salt.     Optivol Thoracic impedance trending continues to suggest possible fluid accumulation starting 9/9.   Prescribed:  Furosemide 20 mg Take 1 tablet (20 mg total) by mouth daily. (May take an additional 20 mg as needed for swelling.   Labs: 11/25/2021 Creatinine 1.67, BUN 17, Potassium 3.7, Sodium 140, GFR 33 A complete set of results can be found in Results Review.   Recommendations:   Unable to reach.     Follow-up plan: ICM clinic phone appointment on 01/06/2022.  91 day device clinic remote transmission scheduled 12/25/2021.     EP/Cardiology Office Visits:  06/10/2022 with Dr Harl Bowie.        Copy of ICM check sent to Dr. Myles Gip.  Will send to Dr Harl Bowie as Juluis Rainier and review if patient is reached.   3 month ICM trend: 12/16/2021.    12-14 Month ICM trend:     Rosalene Billings, RN 12/17/2021 6:37 AM

## 2021-12-17 NOTE — Telephone Encounter (Signed)
Remote ICM transmission received.  Attempted call to patient regarding ICM remote transmission and no answer or voice mail option.  

## 2021-12-25 ENCOUNTER — Ambulatory Visit (INDEPENDENT_AMBULATORY_CARE_PROVIDER_SITE_OTHER): Payer: Medicare Other

## 2021-12-25 DIAGNOSIS — I255 Ischemic cardiomyopathy: Secondary | ICD-10-CM

## 2021-12-26 ENCOUNTER — Other Ambulatory Visit: Payer: Self-pay | Admitting: Cardiology

## 2021-12-26 LAB — CUP PACEART REMOTE DEVICE CHECK
Battery Remaining Longevity: 21 mo
Battery Voltage: 2.93 V
Brady Statistic AP VP Percent: 6.57 %
Brady Statistic AP VS Percent: 0.13 %
Brady Statistic AS VP Percent: 89.63 %
Brady Statistic AS VS Percent: 3.67 %
Brady Statistic RA Percent Paced: 6.66 %
Brady Statistic RV Percent Paced: 7.91 %
Date Time Interrogation Session: 20231108033523
HighPow Impedance: 105 Ohm
Implantable Lead Connection Status: 753985
Implantable Lead Connection Status: 753985
Implantable Lead Connection Status: 753985
Implantable Lead Implant Date: 20150416
Implantable Lead Implant Date: 20150922
Implantable Lead Implant Date: 20150922
Implantable Lead Location: 753858
Implantable Lead Location: 753859
Implantable Lead Location: 753860
Implantable Lead Model: 4598
Implantable Lead Model: 5076
Implantable Lead Model: 6935
Implantable Pulse Generator Implant Date: 20210312
Lead Channel Impedance Value: 171 Ohm
Lead Channel Impedance Value: 171 Ohm
Lead Channel Impedance Value: 184.154
Lead Channel Impedance Value: 184.154
Lead Channel Impedance Value: 184.154
Lead Channel Impedance Value: 285 Ohm
Lead Channel Impedance Value: 342 Ohm
Lead Channel Impedance Value: 342 Ohm
Lead Channel Impedance Value: 342 Ohm
Lead Channel Impedance Value: 380 Ohm
Lead Channel Impedance Value: 380 Ohm
Lead Channel Impedance Value: 399 Ohm
Lead Channel Impedance Value: 456 Ohm
Lead Channel Impedance Value: 570 Ohm
Lead Channel Impedance Value: 608 Ohm
Lead Channel Impedance Value: 608 Ohm
Lead Channel Impedance Value: 646 Ohm
Lead Channel Impedance Value: 665 Ohm
Lead Channel Pacing Threshold Amplitude: 0.625 V
Lead Channel Pacing Threshold Amplitude: 1 V
Lead Channel Pacing Threshold Amplitude: 1.375 V
Lead Channel Pacing Threshold Pulse Width: 0.4 ms
Lead Channel Pacing Threshold Pulse Width: 0.4 ms
Lead Channel Pacing Threshold Pulse Width: 0.4 ms
Lead Channel Sensing Intrinsic Amplitude: 4 mV
Lead Channel Sensing Intrinsic Amplitude: 4 mV
Lead Channel Sensing Intrinsic Amplitude: 7.875 mV
Lead Channel Sensing Intrinsic Amplitude: 7.875 mV
Lead Channel Setting Pacing Amplitude: 1.5 V
Lead Channel Setting Pacing Amplitude: 2 V
Lead Channel Setting Pacing Amplitude: 4.5 V
Lead Channel Setting Pacing Pulse Width: 0.4 ms
Lead Channel Setting Pacing Pulse Width: 0.4 ms
Lead Channel Setting Sensing Sensitivity: 0.3 mV
Zone Setting Status: 755011
Zone Setting Status: 755011

## 2022-01-06 ENCOUNTER — Ambulatory Visit (INDEPENDENT_AMBULATORY_CARE_PROVIDER_SITE_OTHER): Payer: Medicare Other

## 2022-01-06 DIAGNOSIS — I5022 Chronic systolic (congestive) heart failure: Secondary | ICD-10-CM | POA: Diagnosis not present

## 2022-01-06 DIAGNOSIS — Z95810 Presence of automatic (implantable) cardiac defibrillator: Secondary | ICD-10-CM | POA: Diagnosis not present

## 2022-01-07 ENCOUNTER — Telehealth: Payer: Self-pay

## 2022-01-07 NOTE — Telephone Encounter (Signed)
Remote ICM transmission received.  Attempted call to patient regarding ICM remote transmission and no answer.  Mail box is full. 

## 2022-01-07 NOTE — Progress Notes (Signed)
EPIC Encounter for ICM Monitoring  Patient Name: Becky [name] is a 71 y.o. female Date: 01/07/2022 Primary Care Physican: Lorelei Pont, DO Primary Cardiologist: Wyline Mood Electrophysiologist: Mealor BiV Pacing: 96.8%      04/19/2021 Weight: 164 lbs 05/22/2021 Weight: 164 lbs  06/03/2021 Office Weight: 169 lbs 07/02/2021 Office Weight: 170 lbs 09/20/2021 Office Weight: 174 lbs 12/02/2021 Office Weight: 174 lbs   Time in AT/AF  0.0 hr/day (0.0%)     Attempted call to patient and unable to reach.  Transmission reviewed.    DIET: History of drinking more than recommended 64 oz fluid daily and not limiting salt intake.     Optivol Thoracic impedance trending suggesting possible fluid accumulation starting 11/12 and trending back to baseline.   Prescribed:  Furosemide 20 mg Take 1 tablet (20 mg total) by mouth daily. (May take an additional 20 mg as needed for swelling.   Labs: 11/25/2021 Creatinine 1.67, BUN 17, Potassium 3.7, Sodium 140, GFR 33 A complete set of results can be found in Results Review.   Recommendations:   Unable to reach.     Follow-up plan: ICM clinic phone appointment on 01/27/2022 to recheck fluid levels.  91 day device clinic remote transmission scheduled 03/26/2022.     EP/Cardiology Office Visits:  06/10/2022 with Dr Wyline Mood.        Copy of ICM check sent to Dr. Nelly Laurence.  Will send to Dr Wyline Mood as Lorain Childes and review if patient is reached.   3 month ICM trend: 01/06/2022.    12-14 Month ICM trend:     Karie Soda, RN 01/07/2022 11:55 AM

## 2022-01-17 NOTE — Progress Notes (Signed)
Remote ICD transmission.   

## 2022-01-27 ENCOUNTER — Ambulatory Visit (INDEPENDENT_AMBULATORY_CARE_PROVIDER_SITE_OTHER): Payer: Medicare Other

## 2022-01-27 DIAGNOSIS — I5022 Chronic systolic (congestive) heart failure: Secondary | ICD-10-CM

## 2022-01-27 DIAGNOSIS — Z95810 Presence of automatic (implantable) cardiac defibrillator: Secondary | ICD-10-CM

## 2022-01-27 NOTE — Progress Notes (Unsigned)
EPIC Encounter for ICM Monitoring  Patient Name: Becky [name] is a 71 y.o. female Date: 01/27/2022 Primary Care Physican: Lorelei Pont, DO Primary Cardiologist: Wyline Mood Electrophysiologist: Mealor BiV Pacing: 97.1%      04/19/2021 Weight: 164 lbs 05/22/2021 Weight: 164 lbs  06/03/2021 Office Weight: 169 lbs 07/02/2021 Office Weight: 170 lbs 09/20/2021 Office Weight: 174 lbs 12/02/2021 Office Weight: 174 lbs 01/27/2022 Weight: 174 lbs   Time in AT/AF  0.0 hr/day (0.0%)     Spoke with patient and heart failure questions reviewed.  Transmission results reviewed.  Pt asymptomatic for fluid accumulation.  Reports feeling well at this time and voices no complaints.     DIET:  Not following low salt diet and usually drinks more than recommended 64 oz fluid daily.     Optivol Thoracic impedance trending suggesting possible fluid accumulation starting 11/12 with Dec 5th only day at baseline.  Has difficulty maintaining normal fluid levels.    Prescribed:  Furosemide 20 mg Take 1 tablet (20 mg total) by mouth daily. (May take an additional 20 mg as needed for swelling.   Labs: 11/25/2021 Creatinine 1.67, BUN 17, Potassium 3.7, Sodium 140, GFR 33 A complete set of results can be found in Results Review.   Recommendations:   Advised to take additional 20 mg Lasix x 3 days and then return to 1 tablet daily.  Advised to limit salt intake.     Follow-up plan: ICM clinic phone appointment on 02/03/2022 to recheck fluid levels.  91 day device clinic remote transmission scheduled 03/26/2022.     EP/Cardiology Office Visits:  06/10/2022 with Dr Wyline Mood.        Copy of ICM check sent to Dr. Nelly Laurence.  Copy sent to Dr Wyline Mood for review.   3 month ICM trend: 01/27/2022.    12-14 Month ICM trend:     Karie Soda, RN 01/27/2022 3:46 PM

## 2022-01-29 NOTE — Progress Notes (Signed)
No recommendations received from Dr Wyline Mood at this time.  Will recheck fluid levels 12/18.

## 2022-02-06 NOTE — Progress Notes (Signed)
No ICM remote transmission received for 02/03/2022 and next ICM transmission scheduled for 02/19/2022.

## 2022-02-19 ENCOUNTER — Telehealth: Payer: Self-pay

## 2022-02-19 ENCOUNTER — Ambulatory Visit (INDEPENDENT_AMBULATORY_CARE_PROVIDER_SITE_OTHER): Payer: Medicare Other

## 2022-02-19 DIAGNOSIS — I5022 Chronic systolic (congestive) heart failure: Secondary | ICD-10-CM | POA: Diagnosis not present

## 2022-02-19 DIAGNOSIS — Z95810 Presence of automatic (implantable) cardiac defibrillator: Secondary | ICD-10-CM

## 2022-02-19 NOTE — Telephone Encounter (Signed)
Remote ICM transmission received.  Attempted call to patient regarding ICM remote transmission and no answer.  

## 2022-02-19 NOTE — Progress Notes (Signed)
EPIC Encounter for ICM Monitoring  Patient Name: Becky [name] is a 72 y.o. female Date: 02/19/2022 Primary Care Physican: Emelda Fear, DO Primary Cardiologist: Harl Bowie Electrophysiologist: Mealor BiV Pacing: 96%      04/19/2021 Weight: 164 lbs 05/22/2021 Weight: 164 lbs  06/03/2021 Office Weight: 169 lbs 07/02/2021 Office Weight: 170 lbs 09/20/2021 Office Weight: 174 lbs 12/02/2021 Office Weight: 174 lbs 01/27/2022 Weight: 174 lbs 02/19/2022 Weight: 174 lbs   Time in AT/AF  0.0 hr/day (0.0%)     Spoke with patient and heart failure questions reviewed.  Transmission results reviewed.  Pt asymptomatic for fluid accumulation.  She is feeling okay.   DIET:  Not following low salt diet and usually drinks more than recommended 64 oz fluid daily.     Optivol Thoracic impedance trending suggesting possible fluid accumulation starting 11/12 with Dec 5th only day at baseline but trending toward baseline.  Has difficulty maintaining normal fluid levels.    Prescribed:  Furosemide 20 mg Take 1 tablet (20 mg total) by mouth daily. (May take an additional 20 mg as needed for swelling.   Labs: 11/25/2021 Creatinine 1.67, BUN 17, Potassium 3.7, Sodium 140, GFR 33 A complete set of results can be found in Results Review.   Recommendations:   Advised to take extra 20 mg Furosemide daily x 3 days and then return to 1 tablet daily.  Copy sent to Dr Harl Bowie as Juluis Rainier and review.    Follow-up plan: ICM clinic phone appointment on 02/24/2022 to recheck fluid levels.  91 day device clinic remote transmission scheduled 03/26/2022.     EP/Cardiology Office Visits:  06/10/2022 with Dr Harl Bowie.        Copy of ICM check sent to Dr. Myles Gip.   3 month ICM trend: 02/19/2022.    12-14 Month ICM trend:     Rosalene Billings, RN 02/19/2022 2:46 PM

## 2022-02-21 NOTE — Progress Notes (Signed)
No recommendations received from Dr Harl Bowie.

## 2022-02-24 ENCOUNTER — Ambulatory Visit (INDEPENDENT_AMBULATORY_CARE_PROVIDER_SITE_OTHER): Payer: Medicare Other

## 2022-02-24 DIAGNOSIS — I5022 Chronic systolic (congestive) heart failure: Secondary | ICD-10-CM

## 2022-02-24 DIAGNOSIS — Z95810 Presence of automatic (implantable) cardiac defibrillator: Secondary | ICD-10-CM

## 2022-02-24 NOTE — Progress Notes (Signed)
EPIC Encounter for ICM Monitoring  Patient Name: Becky [name] is a 72 y.o. female Date: 02/24/2022 Primary Care Physican: Emelda Fear, DO Primary Cardiologist: Harl Bowie Electrophysiologist: Mealor BiV Pacing: 95.8%      09/20/2021 Office Weight: 174 lbs 12/02/2021 Office Weight: 174 lbs 01/27/2022 Weight: 174 lbs 02/19/2022 Weight: 174 lbs   Time in AT/AF  0.0 hr/day (0.0%)     Spoke with patient and heart failure questions reviewed.  Transmission results reviewed.  Pt asymptomatic for fluid accumulation.  She is feeling okay.   DIET:  Not following low salt diet and usually drinks more than recommended 64 oz fluid daily.     Optivol Thoracic impedance trending suggesting fluid levels returned to normal.  Has difficulty maintaining normal fluid levels.    Prescribed:  Furosemide 20 mg Take 1 tablet (20 mg total) by mouth daily. (May take an additional 20 mg as needed for swelling.   Labs: 11/25/2021 Creatinine 1.67, BUN 17, Potassium 3.7, Sodium 140, GFR 33 A complete set of results can be found in Results Review.   Recommendations:   No changes and encouraged to call if experiencing any fluid symptoms.   Follow-up plan: ICM clinic phone appointment on 03/24/2022.  91 day device clinic remote transmission scheduled 03/26/2022.     EP/Cardiology Office Visits:  06/10/2022 with Dr Harl Bowie.        Copy of ICM check sent to Dr. Myles Gip.   3 month ICM trend: 02/24/2022.    12-14 Month ICM trend:     Rosalene Billings, RN 02/24/2022 12:18 PM

## 2022-02-26 ENCOUNTER — Other Ambulatory Visit: Payer: Self-pay | Admitting: Cardiology

## 2022-03-24 ENCOUNTER — Ambulatory Visit: Payer: Medicare Other | Attending: Cardiovascular Disease

## 2022-03-24 DIAGNOSIS — Z95810 Presence of automatic (implantable) cardiac defibrillator: Secondary | ICD-10-CM | POA: Diagnosis not present

## 2022-03-24 DIAGNOSIS — I5022 Chronic systolic (congestive) heart failure: Secondary | ICD-10-CM

## 2022-03-26 ENCOUNTER — Ambulatory Visit (INDEPENDENT_AMBULATORY_CARE_PROVIDER_SITE_OTHER): Payer: Medicare Other

## 2022-03-26 ENCOUNTER — Telehealth: Payer: Self-pay

## 2022-03-26 DIAGNOSIS — I255 Ischemic cardiomyopathy: Secondary | ICD-10-CM

## 2022-03-26 LAB — CUP PACEART REMOTE DEVICE CHECK
Battery Remaining Longevity: 17 mo
Battery Voltage: 2.93 V
Brady Statistic AP VP Percent: 5.5 %
Brady Statistic AP VS Percent: 0.1 %
Brady Statistic AS VP Percent: 92.8 %
Brady Statistic AS VS Percent: 1.6 %
Brady Statistic RA Percent Paced: 5.56 %
Brady Statistic RV Percent Paced: 0.09 %
Date Time Interrogation Session: 20240207001807
HighPow Impedance: 73 Ohm
Implantable Lead Connection Status: 753985
Implantable Lead Connection Status: 753985
Implantable Lead Connection Status: 753985
Implantable Lead Implant Date: 20150416
Implantable Lead Implant Date: 20150922
Implantable Lead Implant Date: 20150922
Implantable Lead Location: 753858
Implantable Lead Location: 753859
Implantable Lead Location: 753860
Implantable Lead Model: 4598
Implantable Lead Model: 5076
Implantable Lead Model: 6935
Implantable Pulse Generator Implant Date: 20210312
Lead Channel Impedance Value: 142.5 Ohm
Lead Channel Impedance Value: 151.406
Lead Channel Impedance Value: 155.455
Lead Channel Impedance Value: 155.455
Lead Channel Impedance Value: 166.114
Lead Channel Impedance Value: 285 Ohm
Lead Channel Impedance Value: 285 Ohm
Lead Channel Impedance Value: 285 Ohm
Lead Channel Impedance Value: 323 Ohm
Lead Channel Impedance Value: 342 Ohm
Lead Channel Impedance Value: 342 Ohm
Lead Channel Impedance Value: 342 Ohm
Lead Channel Impedance Value: 456 Ohm
Lead Channel Impedance Value: 494 Ohm
Lead Channel Impedance Value: 494 Ohm
Lead Channel Impedance Value: 513 Ohm
Lead Channel Impedance Value: 551 Ohm
Lead Channel Impedance Value: 551 Ohm
Lead Channel Pacing Threshold Amplitude: 0.875 V
Lead Channel Pacing Threshold Amplitude: 1 V
Lead Channel Pacing Threshold Amplitude: 2.75 V
Lead Channel Pacing Threshold Pulse Width: 0.4 ms
Lead Channel Pacing Threshold Pulse Width: 0.4 ms
Lead Channel Pacing Threshold Pulse Width: 0.4 ms
Lead Channel Sensing Intrinsic Amplitude: 2.875 mV
Lead Channel Sensing Intrinsic Amplitude: 2.875 mV
Lead Channel Sensing Intrinsic Amplitude: 6.875 mV
Lead Channel Sensing Intrinsic Amplitude: 6.875 mV
Lead Channel Setting Pacing Amplitude: 1.75 V
Lead Channel Setting Pacing Amplitude: 2.25 V
Lead Channel Setting Pacing Amplitude: 5 V
Lead Channel Setting Pacing Pulse Width: 0.4 ms
Lead Channel Setting Pacing Pulse Width: 0.4 ms
Lead Channel Setting Sensing Sensitivity: 0.3 mV
Zone Setting Status: 755011
Zone Setting Status: 755011

## 2022-03-26 NOTE — Telephone Encounter (Signed)
Remote ICM transmission received.  Attempted call to patient regarding ICM remote transmission and left detailed message per DPR.  Advised to return call for any fluid symptoms or questions. Next ICM remote transmission scheduled 04/28/2022.    

## 2022-03-26 NOTE — Progress Notes (Signed)
EPIC Encounter for ICM Monitoring  Patient Name: Becky [name] is a 72 y.o. female Date: 03/26/2022 Primary Care Physican: Emelda Fear, DO Primary Cardiologist: Harl Bowie Electrophysiologist: Mealor BiV Pacing: 97.7%      09/20/2021 Office Weight: 174 lbs 12/02/2021 Office Weight: 174 lbs 01/27/2022 Weight: 174 lbs 02/19/2022 Weight: 174 lbs   Time in AT/AF  0.0 hr/day (0.0%)     Attempted call to patient and unable to reach.  Left detailed message per DPR regarding transmission. Transmission reviewed.    DIET:  Does not follow low salt diet and usually drinks more than recommended 64 oz fluid daily.     Optivol Thoracic impedance suggesting fluid levels trending close to normal since 1/25.  Difficulty maintaining normal fluid levels since 10/2021.    Prescribed:  Furosemide 20 mg Take 1 tablet (20 mg total) by mouth daily. (May take an additional 20 mg as needed for swelling).   Labs: 11/25/2021 Creatinine 1.67, BUN 17, Potassium 3.7, Sodium 140, GFR 33 A complete set of results can be found in Results Review.   Recommendations:   Left voice mail with ICM number and encouraged to call if experiencing any fluid symptoms.   Follow-up plan: ICM clinic phone appointment on 04/28/2022.  91 day device clinic remote transmission scheduled 06/24/2022.     EP/Cardiology Office Visits:  06/10/2022 with Dr Harl Bowie.        Copy of ICM check sent to Dr. Myles Gip.   3 month ICM trend: 03/26/2022.    12-14 Month ICM trend:     Rosalene Billings, RN 03/26/2022 1:56 PM

## 2022-03-27 ENCOUNTER — Other Ambulatory Visit: Payer: Self-pay | Admitting: Cardiology

## 2022-04-22 NOTE — Progress Notes (Signed)
Remote ICD transmission.   

## 2022-04-28 ENCOUNTER — Ambulatory Visit: Payer: Medicare Other | Attending: Cardiovascular Disease

## 2022-04-28 DIAGNOSIS — I5022 Chronic systolic (congestive) heart failure: Secondary | ICD-10-CM

## 2022-04-28 DIAGNOSIS — Z95810 Presence of automatic (implantable) cardiac defibrillator: Secondary | ICD-10-CM

## 2022-04-29 ENCOUNTER — Telehealth: Payer: Self-pay

## 2022-04-29 NOTE — Telephone Encounter (Signed)
Remote ICM transmission received.  Attempted call to patient regarding ICM remote transmission and left detailed message per DPR.  Advised to return call for any fluid symptoms or questions. Next ICM remote transmission scheduled 06/02/2022.

## 2022-04-29 NOTE — Progress Notes (Signed)
EPIC Encounter for ICM Monitoring  Patient Name: Becky [name] is a 72 y.o. female Date: 04/29/2022 Primary Care Physican: Emelda Fear, DO Primary Cardiologist: Harl Bowie Electrophysiologist: Mealor BiV Pacing: 97.4%      09/20/2021 Office Weight: 174 lbs 12/02/2021 Office Weight: 174 lbs 01/27/2022 Weight: 174 lbs 02/19/2022 Weight: 174 lbs   Time in AT/AF  0.0 hr/day (0.0%)     Attempted call to patient and unable to reach.  Left detailed message per DPR regarding transmission. Transmission reviewed.    DIET:  Does not follow low salt diet and usually drinks more than recommended 64 oz fluid daily.     Optivol Thoracic impedance suggesting fluid levels trending close to normal but was suggesting possible fluid accumulation from 2/24-3/6.  Difficulty maintaining normal fluid levels since 10/2021.    Prescribed:  Furosemide 20 mg Take 1 tablet (20 mg total) by mouth daily. (May take an additional 20 mg as needed for swelling).   Labs: 11/25/2021 Creatinine 1.67, BUN 17, Potassium 3.7, Sodium 140, GFR 33 A complete set of results can be found in Results Review.   Recommendations:   Left voice mail with ICM number and encouraged to call if experiencing any fluid symptoms.   Follow-up plan: ICM clinic phone appointment on 06/02/2022.  91 day device clinic remote transmission scheduled 06/24/2022.     EP/Cardiology Office Visits:  06/10/2022 with Dr Harl Bowie.        Copy of ICM check sent to Dr. Myles Gip.   3 month ICM trend: 04/28/2022.    12-14 Month ICM trend:     Rosalene Billings, RN 04/29/2022 2:43 PM

## 2022-05-07 ENCOUNTER — Other Ambulatory Visit: Payer: Self-pay | Admitting: Cardiology

## 2022-06-02 ENCOUNTER — Ambulatory Visit: Payer: Medicare Other | Attending: Cardiovascular Disease

## 2022-06-02 DIAGNOSIS — I5022 Chronic systolic (congestive) heart failure: Secondary | ICD-10-CM

## 2022-06-02 DIAGNOSIS — Z95810 Presence of automatic (implantable) cardiac defibrillator: Secondary | ICD-10-CM | POA: Diagnosis not present

## 2022-06-03 NOTE — Progress Notes (Signed)
EPIC Encounter for ICM Monitoring  Patient Name: Becky [name] is a 72 y.o. female Date: 06/03/2022 Primary Care Physican: Lorelei Pont, DO Primary Cardiologist: Wyline Mood Electrophysiologist: Mealor BiV Pacing: 97.5%      09/20/2021 Office Weight: 174 lbs 12/02/2021 Office Weight: 174 lbs 01/27/2022 Weight: 174 lbs 02/19/2022 Weight: 174 lbs 06/03/2022 Weight: 174 lbs   Time in AT/AF  0.0 hr/day (0.0%)     Spoke with patient and heart failure questions reviewed.  Transmission results reviewed.  Pt asymptomatic for fluid accumulation.  Reports feeling well at this time and voices no complaints.      DIET:  Does not follow low salt diet and usually drinks more than recommended 64 oz fluid daily.     Optivol Thoracic impedance suggesting normal fluid levels since 3/27.  Difficulty maintaining normal fluid levels since 10/2021.    Prescribed:  Furosemide 20 mg Take 1 tablet (20 mg total) by mouth daily. (May take an additional 20 mg as needed for swelling).   Labs: 11/25/2021 Creatinine 1.67, BUN 17, Potassium 3.7, Sodium 140, GFR 33 A complete set of results can be found in Results Review.   Recommendations:   No changes and encouraged to call if experiencing any fluid symptoms.   Follow-up plan: ICM clinic phone appointment on 07/07/2022.  91 day device clinic remote transmission scheduled 06/24/2022.     EP/Cardiology Office Visits:  06/10/2022 with Dr Wyline Mood.    09/19/2022 with Dr Nelly Laurence.    Copy of ICM check sent to Dr. Nelly Laurence.   3 month ICM trend: 06/02/2022.    12-14 Month ICM trend:     Karie Soda, RN 06/03/2022 2:12 PM

## 2022-06-10 ENCOUNTER — Ambulatory Visit: Payer: Medicare Other | Attending: Cardiology | Admitting: Cardiology

## 2022-06-10 NOTE — Progress Notes (Deleted)
Clinical Summary Ms. Quast is a 72 y.o.female  seen today for follow up of the following medical problems.   1. ICM/Chronic systolic heart failure  - history of multiple PCIs as described below, mainly in [place].   - 04/2013 LVEF 30-35%. Repeat echo after BiV AICD 05/2014 shows LVEF 55%, grade I diastolic dysfunction.   - 09/2015 echo LVEF 55-60%, no WMAs, grade I diastolic dysfunction - 12/2015 echo [place]: LVEF 65-70%, moderate LVH.  - medical therapy has been limited by orthostatic symtpoms. Not on ACE or ARB - she has been on long term plavix due to her extensive stent burden    04/2019 echo LVEF 55-60%   - no chest pains, no SOB/DOE - compliant with meds - 09/2021 normal device check       2. OSA    - compliant with cpap - looking to establish with new sleep medicien doctor, prior has left. She report pcp is working on this.    3. Hyperlipidemia - muscle aches on multiple statins, has tolerated pravastatin - labs followed by pcp   Jan 2023 TC 194 TG 165 HDL 52 LDL 109   - last visit we added zetia 10mg  daily        4. HTN -she is compliant with meds       5. COPD - followed by pulmonary in [facility]  - followed by Dr Leavy Cella       6. CKD - followed by nephrology   Past Medical History:  Diagnosis Date   AICD (automatic cardioverter/defibrillator) present    Anxiety    Arthritis    "all my joints; neck, back, legs, arms, elbows"  (10/03/2015)   Asthma    CAD (coronary artery disease)    a. 5/08: s/p DES to PDA and DES to LAD; b. 1/09: s/p DES to pLAD; c. 1/11: s/p DES to LAD x 2, d. LHC (3/12 in [place] - EF 55%, mild ant HK, nl LM, LAD stents ok, oLAD 30, RCA stents ok;  e.  4/13:  s/p DES to RCA, f. 1/14: dLM 20-30, mOM3 20-30, mLAD 80 ISR => s/p 2.5x16 mm Promus Element DES; EF 45-50%    CHF (congestive heart failure) (HCC)    Chronic bronchitis (HCC)    Chronic lower back pain    "into pelvis region" (10/03/2015)   COPD  (chronic obstructive pulmonary disease) (HCC)    Daily headache    "might be from sinus" (10/03/2015)   Depression    hx (10/03/2015)   Fibromyalgia    GERD (gastroesophageal reflux disease)    Heart murmur    HLD (hyperlipidemia)    Hypertension    Ischemic cardiomyopathy    a. Echo (11/14):  EF 25%    LBBB (left bundle Kadejah Sandiford block)    Myocardial infarction (HCC) 12/2012   OSA on CPAP    Pneumonia 1950s   Syncope 11/14   LifeVest placed   Upper back pain, chronic    "into my shoulders; fibromyalgia works here" (10/03/2015)     Allergies  Allergen Reactions   Tape Rash and Other (See Comments)    Severe Reaction (latex tape): Burned hand, Redness-took days to clear up (03/02/12)   Lipitor [Atorvastatin] Other (See Comments)    Leg pain   Prozac [Fluoxetine Hcl] Other (See Comments)    Mean/violent thoughts   Codeine Nausea And Vomiting   Fosamax [Alendronate Sodium] Nausea Only and Other (See Comments)    Stomach pain  Keflex [Cephalexin] Nausea Only     Current Outpatient Medications  Medication Sig Dispense Refill   aspirin EC 81 MG tablet Take 1 tablet (81 mg total) by mouth daily.     Aspirin-Salicylamide-Caffeine (BC HEADACHE PO) Take 1 packet by mouth daily as needed (pain.).      b complex vitamins capsule Take 1 capsule by mouth daily.     cetirizine (ZYRTEC) 10 MG tablet Take 10 mg by mouth at bedtime.   4   cholecalciferol (VITAMIN D3) 25 MCG (1000 UNIT) tablet Take 1,000 Units by mouth daily.     clopidogrel (PLAVIX) 75 MG tablet TAKE 1 TABLET BY MOUTH EVERY DAY 90 tablet 1   escitalopram (LEXAPRO) 20 MG tablet Take 20 mg by mouth daily.      ezetimibe (ZETIA) 10 MG tablet TAKE 1 TABLET(10 MG) BY MOUTH DAILY 30 tablet 6   FARXIGA 5 MG TABS tablet Take 5 mg by mouth daily.     fluticasone (FLONASE) 50 MCG/ACT nasal spray Place 2 sprays into both nostrils at bedtime.   7   formoterol (PERFOROMIST) 20 MCG/2ML nebulizer solution Take 20 mcg by nebulization 2  (two) times daily.     furosemide (LASIX) 20 MG tablet TAKE 1 TABLET BY MOUTH DAILY MAY TAKE AN ADDITIONAL 20 MG AS NEEDED 120 tablet 1   gabapentin (NEURONTIN) 300 MG capsule Take 300 mg by mouth 3 (three) times daily.      glipiZIDE (GLUCOTROL XL) 5 MG 24 hr tablet Take 5 mg by mouth daily.     ipratropium-albuterol (DUONEB) 0.5-2.5 (3) MG/3ML SOLN Take 3 mLs by nebulization every 6 (six) hours as needed (shortness of breath).     lactulose (CHRONULAC) 10 GM/15ML solution Take 10 g by mouth 2 (two) times daily as needed for mild constipation.     magnesium oxide (MAG-OX) 400 MG tablet Take 400 mg by mouth daily. (Patient not taking: Reported on 06/03/2021)     meclizine (ANTIVERT) 25 MG tablet Take 25 mg by mouth 3 (three) times daily.     metFORMIN (GLUCOPHAGE) 500 MG tablet Take 1,000 mg by mouth 2 (two) times daily with a meal.      metoprolol succinate (TOPROL-XL) 50 MG 24 hr tablet TAKE 1 TABLET BY MOUTH DAILY WITH OR IMMEDIATELY FOLLOWING A MEAL 90 tablet 3   montelukast (SINGULAIR) 10 MG tablet Take 10 mg by mouth at bedtime.     nitroGLYCERIN (NITROSTAT) 0.4 MG SL tablet Place 1 tablet (0.4 mg total) under the tongue every 5 (five) minutes x 3 doses as needed. If no relief after 3rd dose, proceed to ED 25 tablet 5   pantoprazole (PROTONIX) 40 MG tablet TAKE 1 TABLET(40 MG) BY MOUTH DAILY 90 tablet 1   pravastatin (PRAVACHOL) 80 MG tablet TAKE 1 TABLET(80 MG) BY MOUTH EVERY EVENING 90 tablet 1   rOPINIRole (REQUIP) 0.5 MG tablet Take 1 mg by mouth at bedtime.      spironolactone (ALDACTONE) 25 MG tablet TAKE 1/2 TABLET(12.5 MG) BY MOUTH DAILY 45 tablet 2   VENTOLIN HFA 108 (90 Base) MCG/ACT inhaler Inhale 1-2 puffs into the lungs every 6 (six) hours as needed for wheezing or shortness of breath.      Vitamin D, Ergocalciferol, (DRISDOL) 1.25 MG (50000 UNIT) CAPS capsule Take 50,000 Units by mouth once a week.     No current facility-administered medications for this visit.     Past  Surgical History:  Procedure Laterality Date   BI-VENTRICULAR IMPLANTABLE  CARDIOVERTER DEFIBRILLATOR N/A 06/02/2013   Procedure: BI-VENTRICULAR IMPLANTABLE CARDIOVERTER DEFIBRILLATOR  (CRT-D);  Surgeon: Gardiner Rhyme, MD;  Location[facility] CATH LAB;  Service: Cardiovascular;  Laterality: N/A;   BI-VENTRICULAR IMPLANTABLE CARDIOVERTER DEFIBRILLATOR  (CRT-D)  06/02/2013   MDT VivaQuad CRTD implanted by Dr Johney Frame for ICM, CHF   BIV ICD GENERATOR CHANGEOUT N/A 04/29/2019   Procedure: BIV ICD GENERATOR CHANGEOUT;  Surgeon: Hillis Range, MD;  Location: [facility] INVASIVE CV LAB;  Service: Cardiovascular;  Laterality: N/A;   CARDIAC CATHETERIZATION  12/2012   S/P MI   CORONARY ANGIOPLASTY WITH STENT PLACEMENT  2007; 2009; 2011   "?2 +1 +1 "   CORONARY ANGIOPLASTY WITH STENT PLACEMENT  02/2012   FRACTURE SURGERY     HYSTEROSCOPY W/ ENDOMETRIAL ABLATION  ~ 1998   LEAD REVISION  11-08-13   RA and LV lead revision by Dr Johney Frame   LEAD REVISION Left 11/08/2013   Procedure: LEAD REVISION;  Surgeon: Gardiner Rhyme, MD;  Location: [facility] CATH LAB;  Service: Cardiovascular;  Laterality: Left;   LEFT HEART CATHETERIZATION WITH CORONARY ANGIOGRAM N/A 01/03/2013   Procedure: LEFT HEART CATHETERIZATION WITH CORONARY ANGIOGRAM;  Surgeon: Kathleene Hazel, MD;  Location: [facility] CATH LAB;  Service: Cardiovascular;  Laterality: N/A;   ORIF ANKLE FRACTURE Right 10/21/2009   TUBAL LIGATION  1989     Allergies  Allergen Reactions   Tape Rash and Other (See Comments)    Severe Reaction (latex tape): Burned hand, Redness-took days to clear up (03/02/12)   Lipitor [Atorvastatin] Other (See Comments)    Leg pain   Prozac [Fluoxetine Hcl] Other (See Comments)    Mean/violent thoughts   Codeine Nausea And Vomiting   Fosamax [Alendronate Sodium] Nausea Only and Other (See Comments)    Stomach pain   Keflex [Cephalexin] Nausea Only      Family History  Problem Relation Age of Onset   Breast cancer Mother    Heart failure  Brother    Breast cancer Sister    Breast cancer Sister    Uterine cancer Sister      Social History Ms. Gerber reports that she has been smoking cigarettes. She started smoking about 31 years ago. She has a 10.00 pack-year smoking history. She has never used smokeless tobacco. Ms. Medellin reports current alcohol use.   Review of Systems CONSTITUTIONAL: No weight loss, fever, chills, weakness or fatigue.  HEENT: Eyes: No visual loss, blurred vision, double vision or yellow sclerae.No hearing loss, sneezing, congestion, runny nose or sore throat.  SKIN: No rash or itching.  CARDIOVASCULAR:  RESPIRATORY: No shortness of breath, cough or sputum.  GASTROINTESTINAL: No anorexia, nausea, vomiting or diarrhea. No abdominal pain or blood.  GENITOURINARY: No burning on urination, no polyuria NEUROLOGICAL: No headache, dizziness, syncope, paralysis, ataxia, numbness or tingling in the extremities. No change in bowel or bladder control.  MUSCULOSKELETAL: No muscle, back pain, joint pain or stiffness.  LYMPHATICS: No enlarged nodes. No history of splenectomy.  PSYCHIATRIC: No history of depression or anxiety.  ENDOCRINOLOGIC: No reports of sweating, cold or heat intolerance. No polyuria or polydipsia.  Marland Kitchen   Physical Examination There were no vitals filed for this visit. There were no vitals filed for this visit.  Gen: resting comfortably, no acute distress HEENT: no scleral icterus, pupils equal round and reactive, no palptable cervical adenopathy,  CV Resp: Clear to auscultation bilaterally GI: abdomen is soft, non-tender, non-distended, normal bowel sounds, no hepatosplenomegaly MSK: extremities are warm, no edema.  Skin:  warm, no rash Neuro:  no focal deficits Psych: appropriate affect   Diagnostic Studies  04/2010 Cath [place], [place]: LVEF 55%, mild anterior hypokinesis. LM normal, LAD with stents in prox and mid portion widely patent, LAD ostial 30%. Diags with luminal irregs.  LCX with luminial irregs. RCA with distal patent stents   Stent cards   May 2008 DES to PDA, DES to LAD   Jan 2009 DES to prox LAD   Mar 05 2009 DES to LAD x2   05/2011 DES to RCA   Jan 2014 stent done [place], [place]   03/2010 Echo: LVEF 40%, hypokinesis of the anteroseptum.    01/02/13 Cath   Hemodynamic Findings:   Central aortic pressure: 147/79   Left ventricular pressure: 130/22/26   Angiographic Findings:   Left main: 30% distal stenosis. It appears that the distal left main has a stent that continues into the LAD.   Left Anterior Descending Artery: Large vessel that courses to the apex. The entire proximal and mid LAD is stented. There is mild stent restenosis in the proximal segment. The mid stented segment has diffuse 30% stent restenosis. The distal vessel becomes small in caliber and has mild diffuse plaque. There is a moderate caliber first diagonal Shalandra Leu with mild plaque disease.   Circumflex Artery: Moderate caliber non-dominant vessel with three moderate caliber obtuse marginal branches. No obstructive disease.   Right Coronary Artery: Large dominant vessel with 30% proximal stenosis, heavily calcified mid vessel with patent stent in the mid vessel (no significant restenosis). The distal vessel has diffuse 40-50% stenosis. The posterolateral Lameisha Schuenemann and PDA are moderate caliber, patent vessels with mild plaque disease.   Left Ventricular Angiogram: LVEF=25-30%. Hypokinesis of the antero-apical wall.   Impression:   1. 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 Elevated troponin following syncopal event. No culprit lesions seen.   3. Moderate to severe LV systolic dysfunction.   Recommendations: Continue medical management of CAD. Workup for syncope is underway. Carotid dopplers today. Will check d-dimer as PE is a possibility. She could also have had an arrythmia with LV dysfunction.   Complications: None. The patient tolerated the procedure well.   01/03/13  Echo   - LVEF 25-30%, akinesis of the anteroseptal and apical myocardium, grade I diastolic dysfunction,   01/03/13 Carotid US   - The vertebral arteries appear patent with antegrade flow. - Findings consistent with1- 39 percent stenosis,high end of scale,involving the right internal carotid artery. - Findings consistent with 40 - 59 percent stenosis, low end of scale,involving the left internal carotid artery. - ICA/CCA ratio. right =1.72. left = 1.52 Other specific details can be found in the table(s) above. Prepared and Electronically Authenticated by  12/13/12 Event monitor   No symptoms reported, sinus rhythm with conduction delay, occasional PVCs.   04/2013 Echo   Study Conclusions  - Left ventricle: The cavity size was normal. Wall thickness was increased in a pattern of mild LVH with moderate basal septal hypertrophy. Systolic function was moderately to severely reduced. The estimated ejection fraction was in the range of 30% to 35%. There is akinesis to dyskinesis of the mid-distal anteroseptal and apical myocardium. There is akinesis of the distalinferoseptal myocardium. Doppler parameters are consistent with abnormal left ventricular relaxation (grade 1 diastolic dysfunction). - Ventricular septum: Septal motion showed abnormal function and dyssynergy. - Aortic valve: Mildly calcified annulus. Probably trileaflet; mildly calcified leaflets. No significant regurgitation. - Mitral valve: Calcified annulus. Trivial regurgitation. -  Left atrium: The atrium was mildly dilated. - Right atrium: Central venous pressure: 8mm Hg (est). - Tricuspid valve: Trivial regurgitation. - Pulmonary arteries: PA peak pressure: 20mm Hg (S). - Pericardium, extracardiac: There was no pericardial effusion. Impressions:  - MildLVH with moderate basal septal hypertrophy, LVEF 30-35% with wall motion abnormalities as outlined, grade 1 diastolic dysfunction. Mild left atrial enlagement.  MAC with trivial mitral regurgitation. MIldlysclerotic aortic valve. Trivial tricuspid regurgitation with normal PASP 20 mmHg.  05/2014 echo Study Conclusions  - Left ventricle: Technically limited study. The cavity size was   normal. Wall thickness was increased in a pattern of moderate   LVH. The estimated ejection fraction was 55%. Doppler parameters   are consistent with abnormal left ventricular relaxation (grade 1   diastolic dysfunction). - Aortic valve: Sclerosis without stenosis. - Right ventricle: The cavity size was normal. Pacer wire or   catheter noted in right ventricle. Systolic function was normal. - Impressions: Since the study in 2015, there is definite   improvement in LV function.  Impressions:  - Since the study in 2015, there is definite improvement in LV   function.     09/2015 Nuclear Stress There was no ST segment deviation noted during stress. No T wave inversion was noted during stress. Defect 1: There is a medium defect of mild severity present in the basal inferior and mid inferior location. This defect is likely a result of bowel loop attenuation artifact. No significant ischemia identified. This is a low risk study. No significant ischemia identified. Nuclear stress EF: 54%. Mid to apical septal wall hypokinesis noted     09/2015 echo Study Conclusions   - Left ventricle: The cavity size was normal. Systolic function was   normal. The estimated ejection fraction was in the range of 55%   to 60%. Wall motion was normal; there were no regional wall   motion abnormalities. Doppler parameters are consistent with   abnormal left ventricular relaxation (grade 1 diastolic   dysfunction). Doppler parameters are consistent with   indeterminate ventricular filling pressure. - Aortic valve: Transvalvular velocity was within the normal range.   There was no stenosis. There was no regurgitation. - Mitral valve: Transvalvular velocity was within the normal  range.   There was no evidence for stenosis. There was no regurgitation. - Left atrium: The atrium was moderately dilated. - Right ventricle: The cavity size was normal. Wall thickness was   normal. Systolic function was normal. - Tricuspid valve: There was no regurgitation.     Assessment and Plan  1. ICM/Chronic systolic heart failure   - previous LVEF 30-35%. After BiV AICD repeat echo with normalized LVEF - medical therapy has been limited due to orthstatic symptoms. ACE-I previously stopped during 09/2015 admission due to hypotension.  - indefinite plavix due to large stent burden   -no symptoms, continue current meds     2.HTN -she is at goal, continue current meds   3. Hyperlipidemia - difficulty tolerating statins in the past, has tolerated pravastatin -last visit we added zetia 10mg  daily as LDL was not at goal - repeat lipid panel          Antoine Poche, M.D., F.A.C.C.

## 2022-06-25 ENCOUNTER — Ambulatory Visit (INDEPENDENT_AMBULATORY_CARE_PROVIDER_SITE_OTHER): Payer: Medicare Other

## 2022-06-25 DIAGNOSIS — I255 Ischemic cardiomyopathy: Secondary | ICD-10-CM | POA: Diagnosis not present

## 2022-06-25 LAB — CUP PACEART REMOTE DEVICE CHECK
Battery Remaining Longevity: 17 mo
Battery Voltage: 2.93 V
Brady Statistic AP VP Percent: 19.54 %
Brady Statistic AP VS Percent: 0.29 %
Brady Statistic AS VP Percent: 74.43 %
Brady Statistic AS VS Percent: 5.75 %
Brady Statistic RA Percent Paced: 19.12 %
Brady Statistic RV Percent Paced: 0.64 %
Date Time Interrogation Session: 20240508043625
HighPow Impedance: 94 Ohm
Implantable Lead Connection Status: 753985
Implantable Lead Connection Status: 753985
Implantable Lead Connection Status: 753985
Implantable Lead Implant Date: 20150416
Implantable Lead Implant Date: 20150922
Implantable Lead Implant Date: 20150922
Implantable Lead Location: 753858
Implantable Lead Location: 753859
Implantable Lead Location: 753860
Implantable Lead Model: 4598
Implantable Lead Model: 5076
Implantable Lead Model: 6935
Implantable Pulse Generator Implant Date: 20210312
Lead Channel Impedance Value: 151.406
Lead Channel Impedance Value: 151.406
Lead Channel Impedance Value: 162.857
Lead Channel Impedance Value: 162.857
Lead Channel Impedance Value: 174.595
Lead Channel Impedance Value: 266 Ohm
Lead Channel Impedance Value: 285 Ohm
Lead Channel Impedance Value: 285 Ohm
Lead Channel Impedance Value: 323 Ohm
Lead Channel Impedance Value: 342 Ohm
Lead Channel Impedance Value: 342 Ohm
Lead Channel Impedance Value: 380 Ohm
Lead Channel Impedance Value: 456 Ohm
Lead Channel Impedance Value: 494 Ohm
Lead Channel Impedance Value: 513 Ohm
Lead Channel Impedance Value: 551 Ohm
Lead Channel Impedance Value: 570 Ohm
Lead Channel Impedance Value: 608 Ohm
Lead Channel Pacing Threshold Amplitude: 0.75 V
Lead Channel Pacing Threshold Amplitude: 1 V
Lead Channel Pacing Threshold Amplitude: 2.5 V
Lead Channel Pacing Threshold Pulse Width: 0.4 ms
Lead Channel Pacing Threshold Pulse Width: 0.4 ms
Lead Channel Pacing Threshold Pulse Width: 0.4 ms
Lead Channel Sensing Intrinsic Amplitude: 3.625 mV
Lead Channel Sensing Intrinsic Amplitude: 3.625 mV
Lead Channel Sensing Intrinsic Amplitude: 6.5 mV
Lead Channel Sensing Intrinsic Amplitude: 6.5 mV
Lead Channel Setting Pacing Amplitude: 1.5 V
Lead Channel Setting Pacing Amplitude: 2 V
Lead Channel Setting Pacing Amplitude: 3.75 V
Lead Channel Setting Pacing Pulse Width: 0.4 ms
Lead Channel Setting Pacing Pulse Width: 0.4 ms
Lead Channel Setting Sensing Sensitivity: 0.3 mV
Zone Setting Status: 755011
Zone Setting Status: 755011

## 2022-07-07 ENCOUNTER — Ambulatory Visit: Payer: Medicare Other | Attending: Cardiovascular Disease

## 2022-07-07 DIAGNOSIS — Z95810 Presence of automatic (implantable) cardiac defibrillator: Secondary | ICD-10-CM

## 2022-07-07 DIAGNOSIS — I5022 Chronic systolic (congestive) heart failure: Secondary | ICD-10-CM | POA: Diagnosis not present

## 2022-07-07 NOTE — Progress Notes (Signed)
EPIC Encounter for ICM Monitoring  Patient Name: Becky [name] is a 72 y.o. female Date: 07/07/2022 Primary Care Physican: Lorelei Pont, DO Primary Cardiologist: Wyline Mood Electrophysiologist: Mealor BiV Pacing: 97.5%      09/20/2021 Office Weight: 174 lbs 12/02/2021 Office Weight: 174 lbs 01/27/2022 Weight: 174 lbs 02/19/2022 Weight: 174 lbs 06/03/2022 Weight: 174 lbs 07/07/2022 Weight: 175 lbs   Time in AT/AF  0.0 hr/day (0.0%)     Spoke with patient and heart failure questions reviewed.  Transmission results reviewed.  Pt reports she had a stroke within the last week but was not hospitalized due to was not a recent stroke.  She went to ER because she fell.  Pt has not been taking Furosemide daily as prescribed and only taking a couple of times a week within the last few weeks.     DIET:  Does not follow low salt diet and usually drinks more than recommended 64 oz fluid daily.     Optivol Thoracic impedance suggesting possible fluid accumulation starting 5/26 and trending back toward baseline.  Difficulty maintaining normal fluid levels since 10/2021.    Prescribed:  Furosemide 20 mg Take 1 tablet (20 mg total) by mouth daily. (May take an additional 20 mg as needed for swelling).   Labs: 11/25/2021 Creatinine 1.67, BUN 17, Potassium 3.7, Sodium 140, GFR 33 A complete set of results can be found in Results Review.   Recommendations:   Advised to take Furosemide as prescribed to help with fluid buildup and she stated she will get back on track to taking it as prescribed.     Follow-up plan: ICM clinic phone appointment on 07/16/2022 to recheck fluid levels.  91 day device clinic remote transmission scheduled 09/24/2022.     EP/Cardiology Office Visits:      09/19/2022 with Dr Nelly Laurence.    Copy of ICM check sent to Dr. Nelly Laurence and Dr Wyline Mood as Lorain Childes.   3 month ICM trend: 07/07/2022.    12-14 Month ICM trend:     Karie Soda, RN 07/07/2022 9:59 AM

## 2022-07-15 ENCOUNTER — Other Ambulatory Visit: Payer: Self-pay | Admitting: Cardiology

## 2022-07-16 ENCOUNTER — Ambulatory Visit: Payer: Medicare Other | Attending: Cardiovascular Disease

## 2022-07-16 DIAGNOSIS — Z95810 Presence of automatic (implantable) cardiac defibrillator: Secondary | ICD-10-CM

## 2022-07-16 DIAGNOSIS — I5022 Chronic systolic (congestive) heart failure: Secondary | ICD-10-CM

## 2022-07-16 NOTE — Progress Notes (Signed)
EPIC Encounter for ICM Monitoring  Patient Name: Becky [name] is a 72 y.o. female Date: 07/16/2022 Primary Care Physican: Lorelei Pont, DO Primary Cardiologist: Wyline Mood Electrophysiologist: Mealor BiV Pacing: 94.1%      07/16/2022 Weight: 162 lbs   Time in AT/AF  0.0 hr/day (0.0%)     Spoke with patient and heart failure questions reviewed.  Transmission results reviewed.  Pt asymptomatic and reports she has been taking Furosemide daily as prescribed and only taking a couple of times a week due to recent stroke.     DIET:  Does not follow low salt diet and usually drinks more than recommended 64 oz fluid daily.     Optivol Thoracic impedance suggesting possible fluid accumulation continues since 5/26 and trending back toward baseline.  Difficulty maintaining normal fluid levels since 10/2021.    Prescribed:  Furosemide 20 mg Take 1 tablet (20 mg total) by mouth daily. (May take an additional 20 mg as needed for swelling).   Labs: 11/25/2021 Creatinine 1.67, BUN 17, Potassium 3.7, Sodium 140, GFR 33 A complete set of results can be found in Results Review.   Recommendations:   Advised to take Furosemide daily as prescribed instead of a couple of times a week.     Follow-up plan: ICM clinic phone appointment on 07/28/2022 to recheck fluid levels.  91 day device clinic remote transmission scheduled 09/24/2022.     EP/Cardiology Office Visits:   09/19/2022 with Dr Nelly Laurence.    Copy of ICM check sent to Dr. Nelly Laurence and Dr Wyline Mood as Lorain Childes.   3 month ICM trend: 07/16/2022.    12-14 Month ICM trend:     Karie Soda, RN 07/16/2022 4:16 PM

## 2022-07-16 NOTE — Progress Notes (Signed)
Remote ICD transmission.   

## 2022-07-28 ENCOUNTER — Telehealth: Payer: Self-pay

## 2022-07-28 ENCOUNTER — Ambulatory Visit: Payer: Medicare Other | Attending: Cardiovascular Disease

## 2022-07-28 DIAGNOSIS — Z95810 Presence of automatic (implantable) cardiac defibrillator: Secondary | ICD-10-CM

## 2022-07-28 DIAGNOSIS — I5022 Chronic systolic (congestive) heart failure: Secondary | ICD-10-CM

## 2022-07-28 NOTE — Progress Notes (Signed)
EPIC Encounter for ICM Monitoring  Patient Name: Becky [name] is a 72 y.o. female Date: 07/28/2022 Primary Care Physican: Lorelei Pont, DO Primary Cardiologist: Wyline Mood Electrophysiologist: Mealor BiV Pacing: 94.2%      07/16/2022 Weight: 162 lbs   Time in AT/AF  0.0 hr/day (0.0%)     Attempted call to patient and unable to reach.  Left detailed message per DPR regarding transmission. Transmission reviewed.    DIET:  Does not follow low salt diet and usually drinks more than recommended 64 oz fluid daily.     Optivol Thoracic impedance suggesting possible fluid accumulation continues since 5/26.  Difficulty maintaining normal fluid levels since 10/2021.    Prescribed:  Furosemide 20 mg Take 1 tablet (20 mg total) by mouth daily. (May take an additional 20 mg as needed for swelling).  Pt reported 07/16/22 she was taking twice a week instead of daily due to recent stroke.    Labs: 07/25/2022 Creatinine 1.62, BUN 21, Potassium 5.0, Sodium 139, GFR 34 A complete set of results can be found in Results Review.   Recommendations:   Left voice mail with ICM number and encouraged to call if experiencing any fluid symptoms.   Follow-up plan: ICM clinic phone appointment on 08/11/2022.  91 day device clinic remote transmission scheduled 09/24/2022.     EP/Cardiology Office Visits:   09/19/2022 with Dr Nelly Laurence.    Copy of ICM check sent to Dr. Nelly Laurence.  Will send copy to Dr Wyline Mood if patient is reached.    3 month ICM trend: 07/28/2022.    12-14 Month ICM trend:     Karie Soda, RN 07/28/2022 3:43 PM

## 2022-07-28 NOTE — Telephone Encounter (Signed)
Remote ICM transmission received.  Attempted call to patient regarding ICM remote transmission and left detailed message per DPR.  Left ICM phone number and advised to return call for any fluid symptoms or questions. Next ICM remote transmission scheduled 08/11/2022.

## 2022-08-04 ENCOUNTER — Other Ambulatory Visit: Payer: Self-pay | Admitting: Cardiology

## 2022-08-11 ENCOUNTER — Ambulatory Visit: Payer: Medicare Other | Attending: Cardiovascular Disease

## 2022-08-11 DIAGNOSIS — Z95810 Presence of automatic (implantable) cardiac defibrillator: Secondary | ICD-10-CM | POA: Diagnosis not present

## 2022-08-11 DIAGNOSIS — I5022 Chronic systolic (congestive) heart failure: Secondary | ICD-10-CM | POA: Diagnosis not present

## 2022-08-13 ENCOUNTER — Telehealth: Payer: Self-pay

## 2022-08-13 NOTE — Telephone Encounter (Signed)
Remote ICM transmission received.  Attempted call to patient regarding ICM remote transmission and left detailed message per DPR.  Left ICM phone number and advised to return call for any fluid symptoms or questions. Next ICM remote transmission scheduled 09/15/2022.    

## 2022-08-13 NOTE — Progress Notes (Signed)
EPIC Encounter for ICM Monitoring  Patient Name: Becky [name] is a 72 y.o. female Date: 08/13/2022 Primary Care Physican: Lorelei Pont, DO Primary Cardiologist: Wyline Mood Electrophysiologist: Mealor BiV Pacing: 91.5%      07/16/2022 Weight: 162 lbs   Time in AT/AF  0.0 hr/[name] (0.0%)     Attempted call to patient and unable to reach.  Left detailed message per DPR regarding transmission. Transmission reviewed.    DIET:  Does not follow low salt diet and usually drinks more than recommended 64 oz fluid daily.     Optivol Thoracic impedance suggesting possible fluid accumulation from 6/15-6/18.   Difficulty maintaining normal fluid levels since 10/2021.    Prescribed:  Furosemide 20 mg Take 1 tablet (20 mg total) by mouth daily. (May take an additional 20 mg as needed for swelling).  Pt reported 07/16/22 she was taking twice a week instead of daily due to recent stroke.    Labs: 07/25/2022 Creatinine 1.62, BUN 21, Potassium 5.0, Sodium 139, GFR 34 A complete set of results can be found in Results Review.   Recommendations:   Left voice mail with ICM number and encouraged to call if experiencing any fluid symptoms.   Follow-up plan: ICM clinic phone appointment on 09/15/2022.  91 [name] device clinic remote transmission scheduled 09/24/2022.     EP/Cardiology Office Visits:   09/19/2022 with Dr Nelly Laurence.    Copy of ICM check sent to Dr. Nelly Laurence.  3 month ICM trend: 08/11/2022.    12-14 Month ICM trend:     Karie Soda, RN 08/13/2022 3:44 PM

## 2022-09-10 ENCOUNTER — Other Ambulatory Visit (HOSPITAL_COMMUNITY): Payer: Self-pay

## 2022-09-15 ENCOUNTER — Telehealth: Payer: Self-pay

## 2022-09-15 ENCOUNTER — Ambulatory Visit: Payer: Medicare Other | Attending: Cardiovascular Disease

## 2022-09-15 DIAGNOSIS — I5022 Chronic systolic (congestive) heart failure: Secondary | ICD-10-CM

## 2022-09-15 DIAGNOSIS — Z95810 Presence of automatic (implantable) cardiac defibrillator: Secondary | ICD-10-CM

## 2022-09-15 NOTE — Telephone Encounter (Signed)
Remote ICM transmission received.  Attempted call to patient regarding ICM remote transmission and left detailed message per DPR with ICM phone number to return call.  Left ICM phone number and advised to return call for any fluid symptoms or questions. Next ICM remote transmission scheduled 09/24/2022.

## 2022-09-15 NOTE — Progress Notes (Signed)
EPIC Encounter for ICM Monitoring  Patient Name: Becky [name] is a 72 y.o. female Date: 09/15/2022 Primary Care Physican: Lorelei Pont, DO Primary Cardiologist: Wyline Mood Electrophysiologist: Mealor BiV Pacing: 94.1%      07/16/2022 Weight: 162 lbs   Time in AT/AF  0.0 hr/day (0.0%)     Attempted call to patient and unable to reach.  Left detailed message per DPR regarding transmission. Transmission reviewed.    DIET:  Does not follow low salt diet and usually drinks more than recommended 64 oz fluid daily.     Optivol Thoracic impedance suggesting possible fluid accumulation starting 6/29.   Difficulty maintaining normal fluid levels since 10/2021.    Prescribed:  Furosemide 20 mg Take 1 tablet (20 mg total) by mouth daily. (May take an additional 20 mg as needed for swelling).  Pt reported 07/16/22 she was taking twice a week instead of daily due to recent stroke.    Labs: 07/25/2022 Creatinine 1.62, BUN 21, Potassium 5.0, Sodium 139, GFR 34 A complete set of results can be found in Results Review.   Recommendations:   Left voice mail with ICM number and encouraged to call if experiencing any fluid symptoms.   Follow-up plan: ICM clinic phone appointment on 09/24/2022 to recheck fluid levels.  91 day device clinic remote transmission scheduled 09/24/2022.     EP/Cardiology Office Visits:   09/19/2022 with Dr Nelly Laurence.  11/27/2022 with Dr Wyline Mood.    Copy of ICM check sent to Dr. Nelly Laurence.  Will send to Dr Wyline Mood for review if patient is reached.   3 month ICM trend: 09/15/2022.    12-14 Month ICM trend:     Karie Soda, RN 09/15/2022 11:01 AM

## 2022-09-19 ENCOUNTER — Ambulatory Visit: Payer: Medicare Other | Attending: Cardiovascular Disease | Admitting: Cardiovascular Disease

## 2022-09-19 ENCOUNTER — Encounter: Payer: Self-pay | Admitting: Cardiovascular Disease

## 2022-09-19 VITALS — BP 132/70 | HR 73 | Ht 60.0 in | Wt 171.6 lb

## 2022-09-19 DIAGNOSIS — I5022 Chronic systolic (congestive) heart failure: Secondary | ICD-10-CM | POA: Diagnosis not present

## 2022-09-19 LAB — CUP PACEART INCLINIC DEVICE CHECK
Battery Remaining Longevity: 14 mo
Battery Voltage: 2.91 V
Brady Statistic AP VP Percent: 17.95 %
Brady Statistic AP VS Percent: 0.31 %
Brady Statistic AS VP Percent: 79.01 %
Brady Statistic AS VS Percent: 2.73 %
Brady Statistic RA Percent Paced: 18.05 %
Brady Statistic RV Percent Paced: 2.1 %
Date Time Interrogation Session: 20240802183951
HighPow Impedance: 71 Ohm
Implantable Lead Connection Status: 753985
Implantable Lead Connection Status: 753985
Implantable Lead Connection Status: 753985
Implantable Lead Implant Date: 20150416
Implantable Lead Implant Date: 20150922
Implantable Lead Implant Date: 20150922
Implantable Lead Location: 753858
Implantable Lead Location: 753859
Implantable Lead Location: 753860
Implantable Lead Model: 4598
Implantable Lead Model: 5076
Implantable Lead Model: 6935
Implantable Pulse Generator Implant Date: 20210312
Lead Channel Impedance Value: 137.586
Lead Channel Impedance Value: 142.5 Ohm
Lead Channel Impedance Value: 145.871
Lead Channel Impedance Value: 151.406
Lead Channel Impedance Value: 151.406
Lead Channel Impedance Value: 266 Ohm
Lead Channel Impedance Value: 285 Ohm
Lead Channel Impedance Value: 285 Ohm
Lead Channel Impedance Value: 285 Ohm
Lead Channel Impedance Value: 323 Ohm
Lead Channel Impedance Value: 342 Ohm
Lead Channel Impedance Value: 380 Ohm
Lead Channel Impedance Value: 437 Ohm
Lead Channel Impedance Value: 456 Ohm
Lead Channel Impedance Value: 494 Ohm
Lead Channel Impedance Value: 494 Ohm
Lead Channel Impedance Value: 513 Ohm
Lead Channel Impedance Value: 513 Ohm
Lead Channel Pacing Threshold Amplitude: 0.75 V
Lead Channel Pacing Threshold Amplitude: 0.875 V
Lead Channel Pacing Threshold Amplitude: 1 V
Lead Channel Pacing Threshold Amplitude: 1 V
Lead Channel Pacing Threshold Amplitude: 1.125 V
Lead Channel Pacing Threshold Amplitude: 1.375 V
Lead Channel Pacing Threshold Pulse Width: 0.4 ms
Lead Channel Pacing Threshold Pulse Width: 0.4 ms
Lead Channel Pacing Threshold Pulse Width: 0.4 ms
Lead Channel Pacing Threshold Pulse Width: 0.4 ms
Lead Channel Pacing Threshold Pulse Width: 0.4 ms
Lead Channel Pacing Threshold Pulse Width: 0.4 ms
Lead Channel Sensing Intrinsic Amplitude: 3.25 mV
Lead Channel Sensing Intrinsic Amplitude: 5.125 mV
Lead Channel Sensing Intrinsic Amplitude: 6.625 mV
Lead Channel Sensing Intrinsic Amplitude: 9.625 mV
Lead Channel Setting Pacing Amplitude: 1.75 V
Lead Channel Setting Pacing Amplitude: 2.25 V
Lead Channel Setting Pacing Amplitude: 3 V
Lead Channel Setting Pacing Pulse Width: 0.4 ms
Lead Channel Setting Pacing Pulse Width: 0.4 ms
Lead Channel Setting Sensing Sensitivity: 0.3 mV
Zone Setting Status: 755011
Zone Setting Status: 755011

## 2022-09-19 NOTE — Progress Notes (Signed)
  Electrophysiology Office Note:    Date:  09/19/2022   ID:  Becky [name], DOB [birth year], MRN [id number]  PCP:  Lorelei Pont, DO   [facility] Providers Cardiologist:  Dina Rich, MD Electrophysiologist:  Hillis Range, MD (Inactive)     Referring MD: Lorelei Pont, DO   History of Present Illness:    Becky [name] is a 72 y.o. female with a medical history significant for ischemic cardiomyopathy with chronic CHF, left bundle branch block who presents for EP follow-up.     Patient has a history of coronary disease and multiple percutaneous interventions, mainly in [place].  She has a history of left bundle branch block and EF less than 35% for which a BiV ICD was placed.  In follow-up, her EF normalized with CRT.     Today, she reports that she has noticed a little bit of fluid retention.  Melvenia Beam from her office has been in contact with her and advised her to increase her furosemide, but she has not been very good about doing this.  EKGs/Labs/Other Studies Reviewed Today:    Echocardiogram:  TTE 04/26/2019 EF 55-60   Monitors:   Stress testing:   Advanced imaging:   Cardiac catherization   EKG:   EKG Interpretation Date/Time:  Friday September 19 2022 13:42:36 EDT Ventricular Rate:  69 PR Interval:  156 QRS Duration:  136 QT Interval:  456 QTC Calculation: 488 R Axis:   -85  Text Interpretation: Normal sinus rhythm Left axis deviation Non-specific intra-ventricular conduction block Minimal voltage criteria for LVH, may be normal variant ( Cornell product ) Inferior infarct (cited on or before 04-Oct-2015) Anterolateral infarct (cited on or before 04-Oct-2015) When compared with ECG of 04-Oct-2015 07:47, Sinus rhythm has replaced Electronic atrial pacemaker Confirmed by York Pellant [phone]) on 09/19/2022 1:57:21 PM     Physical Exam:    VS:  BP 132/70   Pulse 73   Ht 5' (1.524 m)   Wt 171 lb 9.6 oz (77.8 kg)   SpO2 98%    BMI 33.51 kg/m     Wt Readings from Last 3 Encounters:  09/19/22 171 lb 9.6 oz (77.8 kg)  12/05/21 173 lb 6.4 oz (78.7 kg)  09/20/21 174 lb 3.2 oz (79 kg)     GEN:  Well nourished, well developed in no acute distress CARDIAC: RRR, no murmurs, rubs, gallops The device site is normal -- no tenderness, edema, drainage, redness, threatened erosion. RESPIRATORY:  Normal work of breathing MUSCULOSKELETAL: 1+ edema  Device interrogated today --I reviewed the interrogation in detail.  See Paceart for report.  ASSESSMENT & PLAN:    Ischemic cardiomyopathy/CHF/LBBB Medtronic BiV ICD functioning normally She is not device dependent today She is slightly edematous, and OptiVol is elevated.  She has not been taking furosemide reliably.  I encouraged her to follow Lawson Fiscal Short's instructions about increasing her dose. Continue Farxiga 5, metoprolol succinate 50, spironolactone 25   Coronary disease Stable Continue clopidogrel 75, aspirin 81    Signed, Maurice Small, MD  09/19/2022 2:18 PM    [facility]

## 2022-09-19 NOTE — Patient Instructions (Signed)
Medication Instructions:  Continue all current medications.  Labwork: none  Testing/Procedures: none  Follow-Up: 1 year   Any Other Special Instructions Will Be Listed Below (If Applicable).  If you need a refill on your cardiac medications before your next appointment, please call your pharmacy.  

## 2022-09-24 ENCOUNTER — Ambulatory Visit (INDEPENDENT_AMBULATORY_CARE_PROVIDER_SITE_OTHER): Payer: Medicare Other

## 2022-09-24 ENCOUNTER — Ambulatory Visit: Payer: Medicare Other | Attending: Cardiovascular Disease

## 2022-09-24 DIAGNOSIS — Z95810 Presence of automatic (implantable) cardiac defibrillator: Secondary | ICD-10-CM

## 2022-09-24 DIAGNOSIS — I5022 Chronic systolic (congestive) heart failure: Secondary | ICD-10-CM

## 2022-09-24 DIAGNOSIS — I255 Ischemic cardiomyopathy: Secondary | ICD-10-CM | POA: Diagnosis not present

## 2022-09-24 NOTE — Progress Notes (Signed)
EPIC Encounter for ICM Monitoring  Patient Name: Becky [name] is a 72 y.o. female Date: 09/24/2022 Primary Care Physican: Lorelei Pont, DO Primary Cardiologist: Wyline Mood Electrophysiologist: Mealor BiV Pacing: 94.1%      07/16/2022 Weight: 162 lbs 09/19/2022 Office Weight: 171 lbs   Time in AT/AF  0.0 hr/day (0.0%)     Attempted call to patient and unable to reach.  Left detailed message per DPR regarding transmission. Transmission reviewed.  Per 8/2 OV note with Dr Nelly Laurence, patient reports not taking Furosemide as prescribed.     DIET:  Does not follow low salt diet and usually drinks more than recommended 64 oz fluid daily.     Optivol Thoracic impedance suggesting possible fluid accumulation starting 6/29 but showing some improvement starting 8/1.   Difficulty maintaining normal fluid levels since 10/2021.    Prescribed:  Furosemide 20 mg Take 1 tablet (20 mg total) by mouth daily. (May take an additional 20 mg as needed for swelling).  Pt reported 07/16/22 she was taking twice a week instead of daily due to recent stroke.    Labs: 07/25/2022 Creatinine 1.62, BUN 21, Potassium 5.0, Sodium 139, GFR 34 A complete set of results can be found in Results Review.   Recommendations:  Left voice mail with ICM number and encouraged to call if experiencing any fluid symptoms.   Follow-up plan: ICM clinic phone appointment on 10/06/2022 to recheck fluid levels.  91 day device clinic remote transmission scheduled 12/24/2022.     EP/Cardiology Office Visits:  Last EP visit was 09/19/2022 with Dr Nelly Laurence (no recall for future appts).  11/27/2022 with Dr Wyline Mood.    Copy of ICM check sent to Dr. Nelly Laurence.  Will send to Dr Wyline Mood for review if patient is reached.   3 month ICM trend: 09/24/2022.    12-14 Month ICM trend:     Karie Soda, RN 09/24/2022 8:16 AM

## 2022-10-06 ENCOUNTER — Ambulatory Visit: Payer: Medicare Other | Attending: Cardiovascular Disease

## 2022-10-06 DIAGNOSIS — I5022 Chronic systolic (congestive) heart failure: Secondary | ICD-10-CM

## 2022-10-06 DIAGNOSIS — Z95810 Presence of automatic (implantable) cardiac defibrillator: Secondary | ICD-10-CM

## 2022-10-08 NOTE — Progress Notes (Signed)
EPIC Encounter for ICM Monitoring  Patient Name: Becky [name] is a 72 y.o. female Date: 10/08/2022 Primary Care Physican: Lorelei Pont, DO Primary Cardiologist: Wyline Mood Electrophysiologist: Mealor BiV Pacing: 97%      07/16/2022 Weight: 162 lbs 09/19/2022 Office Weight: 171 lbs 10/08/2022 Weight: 171 lbs   Time in AT/AF  0.0 hr/day (0.0%)     Spoke with patient and heart failure questions reviewed.  Transmission results reviewed.  Pt asymptomatic for fluid accumulation.   She takes Lasix once a week due to urinary incontinence which is worse since she had a stroke in May 2024.   DIET:  Does not follow low salt diet and usually drinks more than recommended 64 oz fluid daily.     Optivol Thoracic impedance suggesting possible fluid accumulation starting 6/29.   Difficulty maintaining normal fluid levels since 10/2021.    Prescribed:  Furosemide 20 mg Take 1 tablet (20 mg total) by mouth daily. (May take an additional 20 mg as needed for swelling).  Pt reported 10/08/2022 she was taking twice a week instead of daily due to recent stroke.    Labs: 07/25/2022 Creatinine 1.62, BUN 21, Potassium 5.0, Sodium 139, GFR 34 A complete set of results can be found in Results Review.   Recommendations:  Recommended patient take Lasix daily as prescribed instead once a week.     Follow-up plan: ICM clinic phone appointment on 10/21/2022.  91 day device clinic remote transmission scheduled 12/24/2022.     EP/Cardiology Office Visits:  10/23/2023 with Dr Nelly Laurence (no recall for future appts).  11/27/2022 with Dr Wyline Mood.    Copy of ICM check sent to Dr. Nelly Laurence and Dr Wyline Mood for review and FYI.    3 month ICM trend: 10/06/2022.    12-14 Month ICM trend:     Karie Soda, RN 10/08/2022 12:49 PM

## 2022-10-10 NOTE — Progress Notes (Signed)
Remote ICD transmission.   

## 2022-10-21 ENCOUNTER — Ambulatory Visit: Payer: Medicare Other | Attending: Cardiovascular Disease

## 2022-10-21 DIAGNOSIS — Z95810 Presence of automatic (implantable) cardiac defibrillator: Secondary | ICD-10-CM

## 2022-10-21 DIAGNOSIS — I5022 Chronic systolic (congestive) heart failure: Secondary | ICD-10-CM

## 2022-10-22 NOTE — Progress Notes (Signed)
EPIC Encounter for ICM Monitoring  Patient Name: Becky [name] is a 72 y.o. female Date: 10/22/2022 Primary Care Physican: Lorelei Pont, DO Primary Cardiologist: Wyline Mood Electrophysiologist: Mealor BiV Pacing: 97.2%      07/16/2022 Weight: 162 lbs 09/19/2022 Office Weight: 171 lbs 10/08/2022 Weight: 171 lbs   Time in AT/AF  0.0 hr/day (0.0%)     Spoke with patient and heart failure questions reviewed.  Transmission results reviewed.  Pt asymptomatic for fluid accumulation.   She takes Lasix twice a week instead of daily since May when she had a stroke.  Have encouraged to take Lasix daily as prescribed and explained fluid accumulation causes the heart to work harder.      DIET:  Does not follow low salt diet and usually drinks more than recommended 64 oz fluid daily.     Optivol Thoracic impedance suggesting ongoing possible fluid accumulation starting 6/29 due to patient does not take Lasix as prescribed.     Prescribed:  Furosemide 20 mg Take 1 tablet (20 mg total) by mouth daily. (May take an additional 20 mg as needed for swelling).  Pt reported 10/08/2022 she was taking twice a week instead of daily due to recent stroke.    Labs: 07/25/2022 Creatinine 1.62, BUN 21, Potassium 5.0, Sodium 139, GFR 34 A complete set of results can be found in Results Review.   Recommendations:  Explained to patient will return to checking fluid levels monthly instead of weekly/biweekly since fluid level accumulation will not resolve unless she takes Lasix as prescribed. Advised her to take Lasix daily if she develops symptoms and call if symptoms persist after taking as prescribed.    Follow-up plan: ICM clinic phone appointment on 11/26/2022.  91 day device clinic remote transmission scheduled 12/24/2022.     EP/Cardiology Office Visits:  10/23/2023 with Dr Nelly Laurence (no recall for future appts).  11/27/2022 with Dr Wyline Mood.    Copy of ICM check sent to Dr. Nelly Laurence and Dr Wyline Mood as Lorain Childes.    3 month ICM  trend: 10/21/2022.    12-14 Month ICM trend:     Karie Soda, RN 10/22/2022 12:57 PM

## 2022-11-04 ENCOUNTER — Other Ambulatory Visit: Payer: Self-pay | Admitting: Cardiology

## 2022-11-17 ENCOUNTER — Other Ambulatory Visit: Payer: Self-pay | Admitting: Cardiology

## 2022-11-18 ENCOUNTER — Other Ambulatory Visit: Payer: Self-pay | Admitting: Cardiology

## 2022-11-18 MED ORDER — CLOPIDOGREL BISULFATE 75 MG PO TABS: 75.0000 mg | ORAL_TABLET | Freq: Every day | ORAL | 0 refills | Status: AC

## 2022-11-18 MED ORDER — FUROSEMIDE 20 MG PO TABS: 20.0000 mg | ORAL_TABLET | Freq: Every day | ORAL | 0 refills | Status: AC

## 2022-11-27 ENCOUNTER — Ambulatory Visit: Payer: Medicare Other | Admitting: Cardiology

## 2022-12-02 ENCOUNTER — Other Ambulatory Visit: Payer: Self-pay | Admitting: Cardiology

## 2023-10-23 ENCOUNTER — Encounter: Payer: Medicare Other | Admitting: Cardiovascular Disease
